# Patient Record
Sex: Female | Born: 1955 | Race: White | Hispanic: No | Marital: Married | Smoking: Never smoker
Health system: Southern US, Community
[De-identification: ages and names within clinical notes are randomized; demographics above are authoritative.]

## PROBLEM LIST (undated history)

## (undated) DIAGNOSIS — E785 Hyperlipidemia, unspecified: Secondary | ICD-10-CM

## (undated) DIAGNOSIS — K589 Irritable bowel syndrome without diarrhea: Secondary | ICD-10-CM

## (undated) DIAGNOSIS — G4730 Sleep apnea, unspecified: Secondary | ICD-10-CM

## (undated) DIAGNOSIS — Q2112 Patent foramen ovale: Secondary | ICD-10-CM

## (undated) DIAGNOSIS — D369 Benign neoplasm, unspecified site: Secondary | ICD-10-CM

## (undated) DIAGNOSIS — M8580 Other specified disorders of bone density and structure, unspecified site: Secondary | ICD-10-CM

## (undated) DIAGNOSIS — I10 Essential (primary) hypertension: Secondary | ICD-10-CM

## (undated) DIAGNOSIS — H269 Unspecified cataract: Secondary | ICD-10-CM

## (undated) DIAGNOSIS — N3010 Interstitial cystitis (chronic) without hematuria: Secondary | ICD-10-CM

## (undated) DIAGNOSIS — K219 Gastro-esophageal reflux disease without esophagitis: Secondary | ICD-10-CM

## (undated) DIAGNOSIS — M779 Enthesopathy, unspecified: Secondary | ICD-10-CM

## (undated) DIAGNOSIS — Q211 Atrial septal defect: Secondary | ICD-10-CM

## (undated) DIAGNOSIS — M1990 Unspecified osteoarthritis, unspecified site: Secondary | ICD-10-CM

## (undated) DIAGNOSIS — J189 Pneumonia, unspecified organism: Secondary | ICD-10-CM

## (undated) DIAGNOSIS — G4733 Obstructive sleep apnea (adult) (pediatric): Secondary | ICD-10-CM

## (undated) DIAGNOSIS — G43909 Migraine, unspecified, not intractable, without status migrainosus: Secondary | ICD-10-CM

## (undated) DIAGNOSIS — R5382 Chronic fatigue, unspecified: Secondary | ICD-10-CM

## (undated) DIAGNOSIS — R7303 Prediabetes: Secondary | ICD-10-CM

## (undated) DIAGNOSIS — H3530 Unspecified macular degeneration: Secondary | ICD-10-CM

## (undated) DIAGNOSIS — G4710 Hypersomnia, unspecified: Secondary | ICD-10-CM

## (undated) DIAGNOSIS — M6289 Other specified disorders of muscle: Secondary | ICD-10-CM

## (undated) DIAGNOSIS — F32A Depression, unspecified: Secondary | ICD-10-CM

## (undated) DIAGNOSIS — M797 Fibromyalgia: Secondary | ICD-10-CM

## (undated) DIAGNOSIS — R011 Cardiac murmur, unspecified: Secondary | ICD-10-CM

## (undated) DIAGNOSIS — F419 Anxiety disorder, unspecified: Secondary | ICD-10-CM

## (undated) DIAGNOSIS — R739 Hyperglycemia, unspecified: Secondary | ICD-10-CM

## (undated) HISTORY — DX: Hyperlipidemia, unspecified: E78.5

## (undated) HISTORY — DX: Chronic fatigue, unspecified: R53.82

## (undated) HISTORY — DX: Unspecified macular degeneration: H35.30

## (undated) HISTORY — PX: EYE SURGERY: SHX253

## (undated) HISTORY — DX: Patent foramen ovale: Q21.12

## (undated) HISTORY — DX: Enthesopathy, unspecified: M77.9

## (undated) HISTORY — DX: Benign neoplasm, unspecified site: D36.9

## (undated) HISTORY — DX: Essential (primary) hypertension: I10

## (undated) HISTORY — DX: Irritable bowel syndrome, unspecified: K58.9

## (undated) HISTORY — PX: ANKLE FRACTURE SURGERY: SHX122

## (undated) HISTORY — DX: Hypersomnia, unspecified: G47.10

## (undated) HISTORY — PX: SPINE SURGERY: SHX786

## (undated) HISTORY — DX: Anxiety disorder, unspecified: F41.9

## (undated) HISTORY — PX: BREAST LUMPECTOMY: SHX2

## (undated) HISTORY — PX: ANKLE ARTHROSCOPY: SUR85

## (undated) HISTORY — DX: Interstitial cystitis (chronic) without hematuria: N30.10

## (undated) HISTORY — DX: Sleep apnea, unspecified: G47.30

## (undated) HISTORY — DX: Fibromyalgia: M79.7

## (undated) HISTORY — DX: Other specified disorders of muscle: M62.89

## (undated) HISTORY — PX: FRACTURE SURGERY: SHX138

## (undated) HISTORY — DX: Atrial septal defect: Q21.1

## (undated) HISTORY — PX: BREAST EXCISIONAL BIOPSY: SUR124

## (undated) HISTORY — DX: Depression, unspecified: F32.A

## (undated) HISTORY — DX: Unspecified cataract: H26.9

## (undated) HISTORY — DX: Unspecified osteoarthritis, unspecified site: M19.90

## (undated) HISTORY — DX: Obstructive sleep apnea (adult) (pediatric): G47.33

## (undated) HISTORY — DX: Migraine, unspecified, not intractable, without status migrainosus: G43.909

## (undated) HISTORY — PX: NASAL SINUS SURGERY: SHX719

## (undated) HISTORY — PX: BLADDER SURGERY: SHX569

## (undated) HISTORY — DX: Other specified disorders of bone density and structure, unspecified site: M85.80

## (undated) HISTORY — PX: UPPER GASTROINTESTINAL ENDOSCOPY: SHX188

---
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
## 1988-04-01 HISTORY — PX: VAGINAL HYSTERECTOMY: SUR661

## 1998-08-29 ENCOUNTER — Other Ambulatory Visit: Admission: RE | Admit: 1998-08-29 | Discharge: 1998-08-29 | Payer: Self-pay | Admitting: Obstetrics and Gynecology

## 1999-10-12 ENCOUNTER — Other Ambulatory Visit: Admission: RE | Admit: 1999-10-12 | Discharge: 1999-10-12 | Payer: Self-pay | Admitting: Obstetrics and Gynecology

## 2001-12-14 ENCOUNTER — Encounter: Admission: RE | Admit: 2001-12-14 | Discharge: 2001-12-14 | Payer: Self-pay | Admitting: Obstetrics and Gynecology

## 2001-12-14 ENCOUNTER — Encounter: Payer: Self-pay | Admitting: Obstetrics and Gynecology

## 2002-11-16 ENCOUNTER — Encounter: Admission: RE | Admit: 2002-11-16 | Discharge: 2002-11-16 | Payer: Self-pay | Admitting: Obstetrics and Gynecology

## 2002-11-16 ENCOUNTER — Encounter: Payer: Self-pay | Admitting: Obstetrics and Gynecology

## 2003-01-03 ENCOUNTER — Other Ambulatory Visit: Admission: RE | Admit: 2003-01-03 | Discharge: 2003-01-03 | Payer: Self-pay | Admitting: Obstetrics and Gynecology

## 2003-03-04 ENCOUNTER — Encounter: Payer: Self-pay | Admitting: Family Medicine

## 2003-03-08 ENCOUNTER — Ambulatory Visit (HOSPITAL_COMMUNITY): Admission: RE | Admit: 2003-03-08 | Discharge: 2003-03-08 | Payer: Self-pay | Admitting: Urology

## 2003-03-08 ENCOUNTER — Encounter (INDEPENDENT_AMBULATORY_CARE_PROVIDER_SITE_OTHER): Payer: Self-pay

## 2003-08-12 ENCOUNTER — Ambulatory Visit (HOSPITAL_COMMUNITY): Admission: RE | Admit: 2003-08-12 | Discharge: 2003-08-12 | Payer: Self-pay | Admitting: Urology

## 2003-08-12 ENCOUNTER — Encounter (INDEPENDENT_AMBULATORY_CARE_PROVIDER_SITE_OTHER): Payer: Self-pay | Admitting: Specialist

## 2003-08-12 ENCOUNTER — Ambulatory Visit (HOSPITAL_BASED_OUTPATIENT_CLINIC_OR_DEPARTMENT_OTHER): Admission: RE | Admit: 2003-08-12 | Discharge: 2003-08-12 | Payer: Self-pay | Admitting: Urology

## 2004-01-16 ENCOUNTER — Other Ambulatory Visit: Admission: RE | Admit: 2004-01-16 | Discharge: 2004-01-16 | Payer: Self-pay | Admitting: Obstetrics and Gynecology

## 2004-01-19 ENCOUNTER — Encounter: Admission: RE | Admit: 2004-01-19 | Discharge: 2004-01-19 | Payer: Self-pay | Admitting: Obstetrics and Gynecology

## 2004-04-01 HISTORY — PX: CERVICAL DISCECTOMY: SHX98

## 2004-04-04 ENCOUNTER — Ambulatory Visit (HOSPITAL_COMMUNITY): Admission: RE | Admit: 2004-04-04 | Discharge: 2004-04-04 | Payer: Self-pay | Admitting: Neurosurgery

## 2004-05-01 ENCOUNTER — Ambulatory Visit (HOSPITAL_COMMUNITY): Admission: RE | Admit: 2004-05-01 | Discharge: 2004-05-01 | Payer: Self-pay | Admitting: Neurosurgery

## 2004-05-23 ENCOUNTER — Encounter: Admission: RE | Admit: 2004-05-23 | Discharge: 2004-05-23 | Payer: Self-pay | Admitting: Neurosurgery

## 2004-07-24 ENCOUNTER — Ambulatory Visit (HOSPITAL_BASED_OUTPATIENT_CLINIC_OR_DEPARTMENT_OTHER): Admission: RE | Admit: 2004-07-24 | Discharge: 2004-07-24 | Payer: Self-pay | Admitting: Urology

## 2004-08-10 ENCOUNTER — Ambulatory Visit (HOSPITAL_COMMUNITY): Admission: RE | Admit: 2004-08-10 | Discharge: 2004-08-10 | Payer: Self-pay | Admitting: Neurosurgery

## 2005-01-29 ENCOUNTER — Encounter: Admission: RE | Admit: 2005-01-29 | Discharge: 2005-01-29 | Payer: Self-pay | Admitting: Obstetrics and Gynecology

## 2005-01-30 ENCOUNTER — Other Ambulatory Visit: Admission: RE | Admit: 2005-01-30 | Discharge: 2005-01-30 | Payer: Self-pay | Admitting: Obstetrics and Gynecology

## 2005-02-19 ENCOUNTER — Encounter: Admission: RE | Admit: 2005-02-19 | Discharge: 2005-02-19 | Payer: Self-pay | Admitting: Obstetrics and Gynecology

## 2005-02-25 ENCOUNTER — Ambulatory Visit: Payer: Self-pay | Admitting: Internal Medicine

## 2005-02-26 ENCOUNTER — Ambulatory Visit: Payer: Self-pay | Admitting: Internal Medicine

## 2005-03-08 ENCOUNTER — Ambulatory Visit: Payer: Self-pay | Admitting: Internal Medicine

## 2005-04-18 ENCOUNTER — Ambulatory Visit: Payer: Self-pay | Admitting: Internal Medicine

## 2005-07-30 ENCOUNTER — Ambulatory Visit: Payer: Self-pay | Admitting: Internal Medicine

## 2005-09-10 ENCOUNTER — Ambulatory Visit: Payer: Self-pay | Admitting: Internal Medicine

## 2005-09-20 ENCOUNTER — Encounter (INDEPENDENT_AMBULATORY_CARE_PROVIDER_SITE_OTHER): Payer: Self-pay | Admitting: *Deleted

## 2005-09-20 ENCOUNTER — Ambulatory Visit: Payer: Self-pay | Admitting: Internal Medicine

## 2006-02-03 ENCOUNTER — Encounter: Admission: RE | Admit: 2006-02-03 | Discharge: 2006-02-03 | Payer: Self-pay | Admitting: Obstetrics and Gynecology

## 2006-02-26 ENCOUNTER — Ambulatory Visit (HOSPITAL_COMMUNITY): Admission: RE | Admit: 2006-02-26 | Discharge: 2006-02-26 | Payer: Self-pay | Admitting: Urology

## 2006-02-26 ENCOUNTER — Encounter (INDEPENDENT_AMBULATORY_CARE_PROVIDER_SITE_OTHER): Payer: Self-pay | Admitting: *Deleted

## 2006-03-03 ENCOUNTER — Encounter: Admission: RE | Admit: 2006-03-03 | Discharge: 2006-03-03 | Payer: Self-pay

## 2006-05-09 ENCOUNTER — Ambulatory Visit (HOSPITAL_BASED_OUTPATIENT_CLINIC_OR_DEPARTMENT_OTHER): Admission: RE | Admit: 2006-05-09 | Discharge: 2006-05-09 | Payer: Self-pay | Admitting: Urology

## 2006-06-16 ENCOUNTER — Other Ambulatory Visit: Payer: Self-pay

## 2006-06-16 ENCOUNTER — Inpatient Hospital Stay: Payer: Self-pay | Admitting: Specialist

## 2007-01-21 ENCOUNTER — Encounter: Payer: Self-pay | Admitting: Family Medicine

## 2007-01-22 ENCOUNTER — Ambulatory Visit: Payer: Self-pay | Admitting: Family Medicine

## 2007-01-22 DIAGNOSIS — Q2111 Secundum atrial septal defect: Secondary | ICD-10-CM | POA: Insufficient documentation

## 2007-01-22 DIAGNOSIS — M159 Polyosteoarthritis, unspecified: Secondary | ICD-10-CM

## 2007-01-22 DIAGNOSIS — N3010 Interstitial cystitis (chronic) without hematuria: Secondary | ICD-10-CM

## 2007-01-22 DIAGNOSIS — E559 Vitamin D deficiency, unspecified: Secondary | ICD-10-CM

## 2007-01-22 DIAGNOSIS — G43909 Migraine, unspecified, not intractable, without status migrainosus: Secondary | ICD-10-CM

## 2007-01-22 DIAGNOSIS — Q211 Atrial septal defect: Secondary | ICD-10-CM | POA: Insufficient documentation

## 2007-01-22 DIAGNOSIS — M797 Fibromyalgia: Secondary | ICD-10-CM

## 2007-01-22 DIAGNOSIS — N951 Menopausal and female climacteric states: Secondary | ICD-10-CM | POA: Insufficient documentation

## 2007-01-29 ENCOUNTER — Telehealth (INDEPENDENT_AMBULATORY_CARE_PROVIDER_SITE_OTHER): Payer: Self-pay | Admitting: *Deleted

## 2007-02-04 ENCOUNTER — Encounter: Payer: Self-pay | Admitting: Family Medicine

## 2007-02-11 ENCOUNTER — Ambulatory Visit (HOSPITAL_BASED_OUTPATIENT_CLINIC_OR_DEPARTMENT_OTHER): Admission: RE | Admit: 2007-02-11 | Discharge: 2007-02-11 | Payer: Self-pay | Admitting: Rheumatology

## 2007-02-15 ENCOUNTER — Ambulatory Visit: Payer: Self-pay | Admitting: Internal Medicine

## 2007-02-18 ENCOUNTER — Encounter: Admission: RE | Admit: 2007-02-18 | Discharge: 2007-02-18 | Payer: Self-pay | Admitting: Family Medicine

## 2007-02-27 ENCOUNTER — Encounter (INDEPENDENT_AMBULATORY_CARE_PROVIDER_SITE_OTHER): Payer: Self-pay | Admitting: *Deleted

## 2007-03-31 ENCOUNTER — Encounter: Payer: Self-pay | Admitting: Family Medicine

## 2007-04-01 ENCOUNTER — Encounter: Payer: Self-pay | Admitting: Family Medicine

## 2007-04-13 ENCOUNTER — Encounter: Payer: Self-pay | Admitting: Family Medicine

## 2007-04-23 ENCOUNTER — Ambulatory Visit: Payer: Self-pay | Admitting: Family Medicine

## 2007-04-23 DIAGNOSIS — R5383 Other fatigue: Secondary | ICD-10-CM

## 2007-04-24 LAB — CONVERTED CEMR LAB
AST: 37 units/L (ref 0–37)
Albumin: 3.8 g/dL (ref 3.5–5.2)
BUN: 4 mg/dL — ABNORMAL LOW (ref 6–23)
Basophils Absolute: 0.1 10*3/uL (ref 0.0–0.1)
Chloride: 98 meq/L (ref 96–112)
Cholesterol: 225 mg/dL (ref 0–200)
Direct LDL: 149.9 mg/dL
Eosinophils Absolute: 0.2 10*3/uL (ref 0.0–0.6)
GFR calc non Af Amer: 81 mL/min
HCT: 40.2 % (ref 36.0–46.0)
HDL: 51.5 mg/dL (ref >39.0)
MCHC: 33.5 g/dL (ref 30.0–36.0)
MCV: 91.8 fL (ref 78.0–100.0)
Monocytes Relative: 9.6 % (ref 3.0–11.0)
Neutrophils Relative %: 47.8 % (ref 43.0–77.0)
Potassium: 4.2 meq/L (ref 3.5–5.1)
RBC: 4.38 M/uL (ref 3.87–5.11)
Sodium: 137 meq/L (ref 135–145)
TSH: 4.07 microintl units/mL (ref 0.35–5.50)
Triglycerides: 218 mg/dL (ref 0–149)

## 2007-05-26 ENCOUNTER — Encounter: Payer: Self-pay | Admitting: Family Medicine

## 2007-05-26 ENCOUNTER — Ambulatory Visit: Payer: Self-pay | Admitting: Internal Medicine

## 2007-05-31 DIAGNOSIS — E785 Hyperlipidemia, unspecified: Secondary | ICD-10-CM | POA: Insufficient documentation

## 2007-05-31 DIAGNOSIS — I10 Essential (primary) hypertension: Secondary | ICD-10-CM

## 2007-06-03 ENCOUNTER — Encounter: Payer: Self-pay | Admitting: Family Medicine

## 2007-06-07 ENCOUNTER — Encounter: Payer: Self-pay | Admitting: Internal Medicine

## 2007-06-07 ENCOUNTER — Ambulatory Visit (HOSPITAL_BASED_OUTPATIENT_CLINIC_OR_DEPARTMENT_OTHER): Admission: RE | Admit: 2007-06-07 | Discharge: 2007-06-07 | Payer: Self-pay | Admitting: Internal Medicine

## 2007-06-08 ENCOUNTER — Encounter: Payer: Self-pay | Admitting: Internal Medicine

## 2007-06-20 ENCOUNTER — Ambulatory Visit: Payer: Self-pay | Admitting: Internal Medicine

## 2007-06-25 ENCOUNTER — Ambulatory Visit: Payer: Self-pay | Admitting: Internal Medicine

## 2007-06-25 DIAGNOSIS — G4733 Obstructive sleep apnea (adult) (pediatric): Secondary | ICD-10-CM | POA: Insufficient documentation

## 2007-07-08 ENCOUNTER — Encounter: Payer: Self-pay | Admitting: Internal Medicine

## 2007-07-27 ENCOUNTER — Ambulatory Visit: Payer: Self-pay | Admitting: Internal Medicine

## 2007-09-03 ENCOUNTER — Encounter: Payer: Self-pay | Admitting: Family Medicine

## 2007-10-29 ENCOUNTER — Telehealth: Payer: Self-pay | Admitting: Internal Medicine

## 2007-10-29 ENCOUNTER — Ambulatory Visit: Payer: Self-pay | Admitting: Internal Medicine

## 2007-10-30 LAB — CONVERTED CEMR LAB
ALT: 18 units/L (ref 0–35)
AST: 20 units/L (ref 0–37)
Albumin: 4.3 g/dL (ref 3.5–5.2)
Basophils Relative: 0.9 % (ref 0.0–3.0)
Hemoglobin: 15.3 g/dL — ABNORMAL HIGH (ref 12.0–15.0)
Lymphocytes Relative: 41.9 % (ref 12.0–46.0)
Monocytes Relative: 5.9 % (ref 3.0–12.0)
Neutro Abs: 3.2 10*3/uL (ref 1.4–7.7)
RBC: 5.11 M/uL (ref 3.87–5.11)

## 2007-11-02 ENCOUNTER — Ambulatory Visit (HOSPITAL_COMMUNITY): Admission: RE | Admit: 2007-11-02 | Discharge: 2007-11-02 | Payer: Self-pay | Admitting: Internal Medicine

## 2007-11-06 ENCOUNTER — Encounter: Payer: Self-pay | Admitting: Family Medicine

## 2007-11-23 ENCOUNTER — Telehealth: Payer: Self-pay | Admitting: Internal Medicine

## 2007-11-23 ENCOUNTER — Ambulatory Visit: Payer: Self-pay | Admitting: Internal Medicine

## 2007-11-24 ENCOUNTER — Encounter: Payer: Self-pay | Admitting: Family Medicine

## 2007-12-30 ENCOUNTER — Telehealth: Payer: Self-pay | Admitting: Internal Medicine

## 2008-01-04 ENCOUNTER — Ambulatory Visit: Payer: Self-pay | Admitting: Internal Medicine

## 2008-01-04 DIAGNOSIS — G2402 Drug induced acute dystonia: Secondary | ICD-10-CM | POA: Insufficient documentation

## 2008-01-21 ENCOUNTER — Encounter: Payer: Self-pay | Admitting: Family Medicine

## 2008-02-22 ENCOUNTER — Telehealth: Payer: Self-pay | Admitting: Internal Medicine

## 2008-03-01 ENCOUNTER — Encounter: Admission: RE | Admit: 2008-03-01 | Discharge: 2008-03-01 | Payer: Self-pay | Admitting: Family Medicine

## 2008-03-01 ENCOUNTER — Ambulatory Visit (HOSPITAL_COMMUNITY): Admission: RE | Admit: 2008-03-01 | Discharge: 2008-03-01 | Payer: Self-pay | Admitting: Internal Medicine

## 2008-04-07 ENCOUNTER — Encounter: Payer: Self-pay | Admitting: Family Medicine

## 2008-05-05 ENCOUNTER — Ambulatory Visit: Payer: Self-pay | Admitting: Family Medicine

## 2008-05-05 DIAGNOSIS — K219 Gastro-esophageal reflux disease without esophagitis: Secondary | ICD-10-CM

## 2008-05-06 ENCOUNTER — Encounter: Payer: Self-pay | Admitting: Family Medicine

## 2008-05-11 ENCOUNTER — Encounter (INDEPENDENT_AMBULATORY_CARE_PROVIDER_SITE_OTHER): Payer: Self-pay | Admitting: *Deleted

## 2008-05-11 LAB — CONVERTED CEMR LAB
ALT: 13 units/L (ref 0–35)
Albumin: 4.1 g/dL (ref 3.5–5.2)
Alkaline Phosphatase: 63 units/L (ref 39–117)
CO2: 19 meq/L (ref 19–32)
Eosinophils Absolute: 0.1 10*3/uL (ref 0.0–0.7)
Glucose, Bld: 107 mg/dL — ABNORMAL HIGH (ref 70–99)
LDL Cholesterol: 153 mg/dL — ABNORMAL HIGH (ref 0–99)
Lymphocytes Relative: 46 % (ref 12–46)
Lymphs Abs: 2.3 10*3/uL (ref 0.7–4.0)
Neutrophils Relative %: 46 % (ref 43–77)
Platelets: 275 10*3/uL (ref 150–400)
Potassium: 4.4 meq/L (ref 3.5–5.3)
Sodium: 141 meq/L (ref 135–145)
TSH: 2.665 microintl units/mL (ref 0.350–4.50)
Total Protein: 7 g/dL (ref 6.0–8.3)
Triglycerides: 205 mg/dL — ABNORMAL HIGH (ref <150)
Vit D, 1,25-Dihydroxy: 27 — ABNORMAL LOW (ref 30–89)
WBC: 5.1 10*3/uL (ref 4.0–10.5)

## 2008-05-19 ENCOUNTER — Encounter: Payer: Self-pay | Admitting: Family Medicine

## 2008-06-24 ENCOUNTER — Telehealth: Payer: Self-pay | Admitting: Family Medicine

## 2008-07-06 ENCOUNTER — Encounter: Payer: Self-pay | Admitting: Family Medicine

## 2008-09-09 ENCOUNTER — Ambulatory Visit: Payer: Self-pay | Admitting: Internal Medicine

## 2008-09-09 ENCOUNTER — Ambulatory Visit (HOSPITAL_COMMUNITY): Admission: RE | Admit: 2008-09-09 | Discharge: 2008-09-09 | Payer: Self-pay | Admitting: Internal Medicine

## 2008-09-09 LAB — CONVERTED CEMR LAB: IgA: 297 mg/dL (ref 68–378)

## 2008-09-14 LAB — CONVERTED CEMR LAB: Tissue Transglutaminase Ab, IgA: 0.5 units (ref <7)

## 2008-09-27 ENCOUNTER — Encounter: Payer: Self-pay | Admitting: Family Medicine

## 2008-10-05 ENCOUNTER — Ambulatory Visit: Payer: Self-pay | Admitting: Family Medicine

## 2008-10-05 DIAGNOSIS — F329 Major depressive disorder, single episode, unspecified: Secondary | ICD-10-CM

## 2008-10-05 DIAGNOSIS — R4589 Other symptoms and signs involving emotional state: Secondary | ICD-10-CM | POA: Insufficient documentation

## 2008-10-13 ENCOUNTER — Telehealth (INDEPENDENT_AMBULATORY_CARE_PROVIDER_SITE_OTHER): Payer: Self-pay

## 2008-10-20 ENCOUNTER — Encounter: Payer: Self-pay | Admitting: Internal Medicine

## 2008-10-24 ENCOUNTER — Telehealth: Payer: Self-pay | Admitting: Family Medicine

## 2008-10-25 ENCOUNTER — Encounter: Payer: Self-pay | Admitting: Family Medicine

## 2008-10-28 ENCOUNTER — Telehealth: Payer: Self-pay | Admitting: Family Medicine

## 2008-11-02 ENCOUNTER — Encounter: Payer: Self-pay | Admitting: Family Medicine

## 2008-11-18 ENCOUNTER — Ambulatory Visit: Payer: Self-pay | Admitting: Internal Medicine

## 2008-12-28 ENCOUNTER — Encounter: Payer: Self-pay | Admitting: Family Medicine

## 2009-02-14 ENCOUNTER — Telehealth (INDEPENDENT_AMBULATORY_CARE_PROVIDER_SITE_OTHER): Payer: Self-pay | Admitting: *Deleted

## 2009-03-01 ENCOUNTER — Encounter: Payer: Self-pay | Admitting: Family Medicine

## 2009-03-02 ENCOUNTER — Encounter: Admission: RE | Admit: 2009-03-02 | Discharge: 2009-03-02 | Payer: Self-pay | Admitting: Family Medicine

## 2009-03-09 ENCOUNTER — Encounter: Payer: Self-pay | Admitting: Family Medicine

## 2009-03-14 ENCOUNTER — Encounter: Payer: Self-pay | Admitting: Family Medicine

## 2009-03-14 ENCOUNTER — Encounter: Admission: RE | Admit: 2009-03-14 | Discharge: 2009-03-14 | Payer: Self-pay | Admitting: Family Medicine

## 2009-03-22 ENCOUNTER — Encounter: Payer: Self-pay | Admitting: Family Medicine

## 2009-05-17 ENCOUNTER — Ambulatory Visit: Payer: Self-pay | Admitting: Family Medicine

## 2009-05-17 DIAGNOSIS — M8580 Other specified disorders of bone density and structure, unspecified site: Secondary | ICD-10-CM

## 2009-05-22 ENCOUNTER — Encounter: Admission: RE | Admit: 2009-05-22 | Discharge: 2009-05-22 | Payer: Self-pay | Admitting: Family Medicine

## 2009-05-22 LAB — CONVERTED CEMR LAB
ALT: 17 units/L (ref 0–35)
AST: 19 units/L (ref 0–37)
Basophils Relative: 0.7 % (ref 0.0–3.0)
CO2: 31 meq/L (ref 19–32)
Calcium: 9.5 mg/dL (ref 8.4–10.5)
Chloride: 104 meq/L (ref 96–112)
Direct LDL: 193.9 mg/dL
Eosinophils Relative: 1.4 % (ref 0.0–5.0)
GFR calc non Af Amer: 79.88 mL/min (ref >60)
HCT: 43.6 % (ref 36.0–46.0)
Hemoglobin: 14.1 g/dL (ref 12.0–15.0)
Lymphs Abs: 2.5 10*3/uL (ref 0.7–4.0)
MCV: 94 fL (ref 78.0–100.0)
Monocytes Absolute: 0.3 10*3/uL (ref 0.1–1.0)
Monocytes Relative: 5 % (ref 3.0–12.0)
Potassium: 4.5 meq/L (ref 3.5–5.1)
RBC: 4.63 M/uL (ref 3.87–5.11)
Sodium: 141 meq/L (ref 135–145)
Total Protein: 7.1 g/dL (ref 6.0–8.3)
Triglycerides: 158 mg/dL — ABNORMAL HIGH (ref 0.0–149.0)
WBC: 5.6 10*3/uL (ref 4.5–10.5)

## 2009-05-23 ENCOUNTER — Encounter: Payer: Self-pay | Admitting: Family Medicine

## 2009-05-30 ENCOUNTER — Telehealth: Payer: Self-pay | Admitting: Family Medicine

## 2009-06-14 ENCOUNTER — Encounter: Payer: Self-pay | Admitting: Family Medicine

## 2009-07-03 ENCOUNTER — Telehealth: Payer: Self-pay | Admitting: Family Medicine

## 2009-07-18 ENCOUNTER — Encounter: Payer: Self-pay | Admitting: Family Medicine

## 2009-08-24 ENCOUNTER — Ambulatory Visit: Payer: Self-pay | Admitting: Family Medicine

## 2009-08-29 LAB — CONVERTED CEMR LAB
AST: 16 units/L (ref 0–37)
Cholesterol: 255 mg/dL — ABNORMAL HIGH (ref 0–200)
Direct LDL: 167.8 mg/dL
Total CHOL/HDL Ratio: 4
VLDL: 58 mg/dL — ABNORMAL HIGH (ref 0.0–40.0)

## 2009-09-13 ENCOUNTER — Encounter: Payer: Self-pay | Admitting: Family Medicine

## 2009-11-14 ENCOUNTER — Encounter: Payer: Self-pay | Admitting: Family Medicine

## 2009-11-30 ENCOUNTER — Ambulatory Visit: Payer: Self-pay | Admitting: Family Medicine

## 2009-12-01 LAB — CONVERTED CEMR LAB
ALT: 9 units/L (ref 0–35)
Cholesterol: 255 mg/dL — ABNORMAL HIGH (ref 0–200)
Direct LDL: 174.5 mg/dL
HDL: 61 mg/dL (ref >39.00)
VLDL: 44.6 mg/dL — ABNORMAL HIGH (ref 0.0–40.0)

## 2009-12-06 ENCOUNTER — Ambulatory Visit: Payer: Self-pay | Admitting: Family Medicine

## 2009-12-28 ENCOUNTER — Telehealth: Payer: Self-pay | Admitting: Family Medicine

## 2010-01-15 ENCOUNTER — Telehealth: Payer: Self-pay | Admitting: Family Medicine

## 2010-01-17 ENCOUNTER — Ambulatory Visit: Payer: Self-pay | Admitting: Family Medicine

## 2010-01-19 LAB — CONVERTED CEMR LAB
ALT: 13 units/L (ref 0–35)
HDL: 54.2 mg/dL (ref >39.00)
Triglycerides: 159 mg/dL — ABNORMAL HIGH (ref 0.0–149.0)
VLDL: 31.8 mg/dL (ref 0.0–40.0)

## 2010-02-13 ENCOUNTER — Encounter: Payer: Self-pay | Admitting: Family Medicine

## 2010-03-07 ENCOUNTER — Telehealth: Payer: Self-pay | Admitting: Family Medicine

## 2010-03-13 ENCOUNTER — Encounter
Admission: RE | Admit: 2010-03-13 | Discharge: 2010-03-13 | Payer: Self-pay | Source: Home / Self Care | Attending: Family Medicine | Admitting: Family Medicine

## 2010-03-15 ENCOUNTER — Encounter: Payer: Self-pay | Admitting: Family Medicine

## 2010-04-05 ENCOUNTER — Telehealth: Payer: Self-pay | Admitting: Internal Medicine

## 2010-04-16 ENCOUNTER — Ambulatory Visit
Admission: RE | Admit: 2010-04-16 | Discharge: 2010-04-16 | Payer: Self-pay | Source: Home / Self Care | Attending: Family Medicine | Admitting: Family Medicine

## 2010-04-16 ENCOUNTER — Telehealth: Payer: Self-pay | Admitting: Family Medicine

## 2010-04-20 ENCOUNTER — Telehealth: Payer: Self-pay | Admitting: Family Medicine

## 2010-04-20 ENCOUNTER — Encounter (INDEPENDENT_AMBULATORY_CARE_PROVIDER_SITE_OTHER): Payer: Self-pay

## 2010-04-22 ENCOUNTER — Encounter: Payer: Self-pay | Admitting: Urology

## 2010-04-24 ENCOUNTER — Ambulatory Visit
Admission: RE | Admit: 2010-04-24 | Discharge: 2010-04-24 | Payer: Self-pay | Source: Home / Self Care | Attending: Internal Medicine | Admitting: Internal Medicine

## 2010-05-01 ENCOUNTER — Ambulatory Visit
Admission: RE | Admit: 2010-05-01 | Discharge: 2010-05-01 | Payer: Self-pay | Source: Home / Self Care | Attending: Internal Medicine | Admitting: Internal Medicine

## 2010-05-01 ENCOUNTER — Encounter: Payer: Self-pay | Admitting: Internal Medicine

## 2010-05-01 NOTE — Letter (Signed)
Summary: [facility]  [facility]   Imported By: Lanelle Bal 10/04/2009 11:52:55  _____________________________________________________________________  External Attachment:    Type:   Image     Comment:   External Document

## 2010-05-01 NOTE — Assessment & Plan Note (Signed)
Summary: FOLLOW UP AFTER LABS/RI   Vital Signs:  Patient profile:   55 year old female Height:      63 inches Weight:      167.75 pounds BMI:     29.82 Temp:     97.7 degrees F oral Pulse rate:   76 / minute Pulse rhythm:   regular BP sitting:   136 / 94  (left arm) Cuff size:   regular  Vitals Entered By: Lewanda Rife LPN (December 06, 2009 2:14 PM)  Serial Vital Signs/Assessments:  Time      Position  BP       Pulse  Resp  Temp     By                     120/82                         Judith Part MD  CC: f/u after labs   History of Present Illness: here for f/u of hyperlipidemia   is doing fair overall  migrianes are worse lately- that has been a big problem  [facility]  tried atenolol for headaches - did help migraines some  taken long time to get adj to it  was considering trying botox but ins may not pay   sleep problem has not changed at all  was able to cut her fenanyl a bit 6 mo ago  then went back up on her wellbutrin   inc hydroxyzine for allergies  still sleeps about 14 hours per day  does not think it is depression   lipids are actually worse with trig of 223 and HDL of 61 and LDL 174.5 (up from 167) father had his first MI in his 9s , with high chol  does not eat much at all  usually eats once per day and does not cook  red meat --- at least 1-2 times per week  no fried foods at all  no eggs  no biscuits , no bacon or sausage  just started eating cheese - 2%  no shellfish  does eat fat free ice cream      diet  bp 136/94 first check today  Allergies: 1)  ! Buspar 2)  ! * Trazadone  Past History:  Past Medical History: Last updated: 05/17/2009 Interstitial cystitis  pelvic floor dysfunction arthritis vit D def                                                                                     urology- Evans ankle fx times 2                                                                          GI- Gessner chronic fatigue  rheum- Devishwar                                                                                                  headache - Dr Sharene Skeans  hand tendonitis migranes PFO mild fibromyalgia Hyperlipidemia Hypertension "complicated migraine"- Dohmeier Hypersomnia IBS- constipation predominant Hemorrhoids osteopenia   Past Surgical History: Last updated: 03/20/2009 many breast sx hyst 1990- because of the IC partial sinus sx multiple multiple bladder sx and hydrodistension cervical diskectomy with fusion and plating 2006 ankle fx bilat with signs 2009 EGD 2007- gastritis, duodentitis colonoscopy 2007- hemorrhoids breast bx 12/10- fibroadenoma   Family History: Last updated: 05/05/2008 father- MI in early 81s, died at 41, DM, ETOH early in life mother- fibromyalgia grandparents P - DM GM with stomach cancer uncle colon cancer brother died in childhood from electrical accident  Social History: Last updated: 10/29/2007 is disabled - (from IC) some stretching exercise class married no children Patient has never smoked.  Alcohol Use - no Illicit Drug Use - no  Risk Factors: Smoking Status: never (10/29/2007)  Review of Systems General:  Complains of fatigue, malaise, and sleep disorder; denies chills and fever. Eyes:  Denies blurring and eye irritation. CV:  Denies chest pain or discomfort, palpitations, shortness of breath with exertion, and swelling of feet. Resp:  Denies cough, shortness of breath, and wheezing. GI:  Denies abdominal pain, change in bowel habits, indigestion, and nausea. GU:  Denies dysuria and urinary frequency. MS:  Complains of joint pain, muscle aches, and stiffness; denies cramps and muscle weakness. Derm:  Denies itching, lesion(s), poor wound healing, and rash. Neuro:  Denies numbness and tingling. Psych:  pt denies depression (but seems  depressed). Endo:  Denies cold intolerance and heat intolerance. Heme:  Denies abnormal bruising and bleeding.   Impression & Recommendations:  Problem # 1:  HYPERLIPIDEMIA (ICD-272.4) Assessment Deteriorated  with fairly low fat diet and fam hx of high chol and early MI  disc risks of high chol rev low sat fat diet  will try zocor low dose and update if side eff lab in 6 weeks  of note - pt upset about this issue and was crying  Her updated medication list for this problem includes:    Zocor 20 Mg Tabs (Simvastatin) .Marland Kitchen... 1 by mouth once daily in evening for high cholesterol  Orders: Prescription Created Electronically [id number])  Problem # 2:  HYPERTENSION, ESSENTIAL NOS (ICD-401.9) Assessment: Improved  better with atenolol- which also helps her headaches  refilled this  Her updated medication list for this problem includes:    Atenolol 100 Mg Tabs (Atenolol) .Marland Kitchen... Take 1 tablet by mouth once a day  BP today: 136/94-- 120/82 on re check at rest  Prior BP: 134/88 (05/17/2009)  Labs Reviewed: K+: 4.5 (05/17/2009) Creat: : 0.8 (05/17/2009)   Chol: 255 (11/30/2009)   HDL: 61.00 (11/30/2009)   LDL: 153 (05/06/2008)   TG: 223.0 (11/30/2009)  Orders: Prescription Created Electronically [phone])  Complete Medication List: 1)  Hydroxyzine Hcl 50 Mg Tabs (Hydroxyzine hcl) .... Take 2 by mouth at bedtime 2)  Diazepam 5 Mg  Tabs (Diazepam) .... 1/2 in the am and 1 in the pm 3)  Elmiron 100 Mg Caps (Pentosan polysulfate sodium) .... Two by mouth two times a day 4)  Hydrocodone-acetaminophen 10-650 Mg Tabs (Hydrocodone-acetaminophen) .... Take by mouth as directed prn 5)  Nitrofurantoin Macrocrystal 100 Mg Caps (Nitrofurantoin macrocrystal) .... One by mouth once daily more as needed. 6)  Wellbutrin Xl 150 Mg Tb24 (Bupropion hcl) .... One by mouth twice  daily 7)  Lidoderm 5 % Ptch (Lidocaine) .... Use as directed 8)  Finacea 15 % Gel (Azelaic acid) .... Apply to face twice a day 9)   Miralax Powd (Polyethylene glycol 3350) .Marland Kitchen.. 1-2 times/day 10)  Pepcid 40 Mg Tabs (Famotidine) .Marland Kitchen.. 1 two times a day 11)  Vitamin D3 10000 Unit Caps (Cholecalciferol) .... Take one daily 12)  Amitiza 8 Mcg Caps (Lubiprostone) .... Take two daily 13)  Estradiol 2 Mg Tabs (Estradiol) .Marland Kitchen.. 1 1/2 by mouth once daily 14)  Fentanyl 50 Mcg/hr Pt72 (Fentanyl) .... One patch every other day. 15)  Vitamin D (ergocalciferol) 50000 Unit Caps (Ergocalciferol) .... One capsule by mouth monthly. 16)  Baclofen 10 Mg Tabs (Baclofen) .... As directed as needed. 17)  Atenolol 100 Mg Tabs (Atenolol) .... Take 1 tablet by mouth once a day 18)  Zocor 20 Mg Tabs (Simvastatin) .Marland Kitchen.. 1 by mouth once daily in evening for high cholesterol  Patient Instructions: 1)  start zocor for high cholesterol 20 mg once daily in the evening  2)  eat low cholesterol diet -- lay off the red meat  3)  you can raise your HDL (good cholesterol) by increasing exercise and eating omega 3 fatty acid supplement like fish oil or flax seed oil over the counter 4)  you can lower LDL (bad cholesterol) by limiting saturated fats in diet like red meat, fried foods, egg yolks, fatty breakfast meats, high fat dairy products and shellfish  5)  schedule fasting labs in 6 weeks lipid/ast/alt 272  6)  if any side effects stop the medicine and let me know  Prescriptions: ATENOLOL 100 MG TABS (ATENOLOL) Take 1 tablet by mouth once a day  #30 x 11   Entered and Authorized by:   Judith Part MD   Signed by:   Judith Part MD on 12/06/2009   Method used:   Electronically to        [facility] [phone]* (retail)       [address] [place], [place]  [address]       Ph: [phone] or [phone]       Fax: [phone]   RxID:   [id number] ZOCOR 20 MG TABS (SIMVASTATIN) 1 by mouth once daily in evening for high cholesterol  #30 x 11   Entered and Authorized by:   Judith Part MD   Signed by:   Judith Part  MD on 12/06/2009   Method used:   Electronically to        [facility] [phone]* (retail)       [address] [place], [place]  [address]       Ph: [phone] or [phone]       Fax: [phone]   RxID:   [id number]   Current Allergies (reviewed today): ! BUSPAR ! * TRAZADONE

## 2010-05-01 NOTE — Progress Notes (Signed)
Summary: regarding hormone change  Phone Note Call from Patient Call back at Home Phone [phone]   Caller: Patient Call For: Judith Part MD Summary of Call: Pt states that since changing from premarin to estradiol she has had some breast tenderness and discharge.  I advised pt that this is probably her body adjusting to the change and that it could continue for awhile.  Advised pt to let us know if it continues longer than a couple of months or if it gets worse.  Also, she said that you had mentioned at one time that you wanted her to have some type of cardiac test to check for murmur.  I couldnt find any mention of that in last 2 office notes.  She was just wondering if you still wanted her to have the test  done. Initial call taken by: Lowella Petties CMA,  July 03, 2009 1:16 PM  Follow-up for Phone Call        if she wants to go back to premarin that is fine with me will disc heart M issue at f/u in May Follow-up by: Judith Part MD,  July 03, 2009 3:51 PM  Additional Follow-up for Phone Call Additional follow up Details #1::        Pt said does not want to go back on Premarin. Pt just letting you know what symptoms she was having with tenderness and some discharge of breast. Should she watch and see how does or what do you suggest. Pt said she did not mention anything about heart murmur???Please advise. Lewanda Rife LPN  July 04, 979 5:08 PM   Laurie's triage note mentioned something about a heart M-- must have been a mistake because I also could not find anything in record... please ask her to cut her estradiol in 1/2 -- take 1/2 daily and let me know if this is better - and change direction on emr  Additional Follow-up by: Judith Part MD,  July 03, 2009 5:13 PM    Additional Follow-up for Phone Call Additional follow up Details #2::    Patient notified as instructed by telephone. Pt said she was going to continue 1 and 1/2 pills for a few more day s to see if her  body is adjusting. She will call back if she does not get better for further instruction on whether to take one pill daily or 1/2 pill daily. Lewanda Rife LPN  July 03, 1912 5:21 PM

## 2010-05-01 NOTE — Progress Notes (Signed)
  Phone Note Call from Patient   Caller: Patient Summary of Call: Called the patient concerning the Psychology referral , she did not want to go to [facility] at all. she has a call into her insurance Co and will wait to hear back from them if they find her someone in [place] or [place] area. She will call us back if she wants our help in making this referral. Initial call taken by: Carlton Adam,  May 30, 2009 2:34 PM

## 2010-05-01 NOTE — Letter (Signed)
Summary: [facility]   Imported By: Lanelle Bal 08/02/2009 11:57:24  _____________________________________________________________________  External Attachment:    Type:   Image     Comment:   External Document

## 2010-05-01 NOTE — Progress Notes (Signed)
Summary: zocor   Phone Note Call from Patient Call back at Home Phone [phone]   Caller: Patient Call For: Judith Part MD Summary of Call: Patient says that she has been feeling very tired and weak since starting the zocor. She says that she doesn't want to take in anymore. She says that she is willing to try something else. [facility] [address]  Initial call taken by: Melody Comas,  December 28, 2009 2:21 PM  Follow-up for Phone Call        first stop for 2 weeks and update me with how she is feeling -- thanks  Follow-up by: Judith Part MD,  December 28, 2009 3:03 PM  Additional Follow-up for Phone Call Additional follow up Details #1::        Left message on machine for patient to call back. Sydell Axon LPN  December 28, 2009 5:00 PM   Advised pt, she was agreeable. Additional Follow-up by: Lowella Petties CMA,  December 29, 2009 10:57 AM   New Allergies: ! ZOCOR New Allergies: ! ZOCOR

## 2010-05-01 NOTE — Assessment & Plan Note (Signed)
Summary: CPX/CLE  R/S FROM 2/8   Vital Signs:  Patient profile:   55 year old female Height:      63 inches Weight:      167 pounds BMI:     29.69 Temp:     97.8 degrees F oral Pulse rate:   72 / minute Pulse rhythm:   regular BP sitting:   134 / 88  (left arm) Cuff size:   regular  Vitals Entered By: Lewanda Rife LPN (May 17, 2009 3:50 PM)  History of Present Illness: here for health mt exam   is doing about the same overall  is generally miserable with various problems  hurts chronically and cannot sleep at all and snores (does not have sleep apnea)  is on medicare now  is tired all the time   looked into seeing a psychologist -cannot afford the 35 $ copay saw counselor in past - did some hypnosis   wt is up 1 lb  bp 134/88 today -- HTN has been well controlled has been up and down with migraines and injection - new medicine is not helping   due to check chol - last LDL was in the 150s   vit D -- never seems to go up , Dr Lacey Jensen tried to supplement her  coming off of it has make her pain worse - is D3 10,000 international units per day  needs level checked   colonosc was nl in 07 with hemorroids   had mam and breast bx this dec- was b9 and adenoma self exam -- no changes , breasts hurt like the rest of them  hyst in past partial with pap in 07 - no hx of abn paps or hpv   dexa 11/08 with osteopenia ca and D  TD 09   did get flu shot this year       Allergies: 1)  ! Buspar 2)  ! * Trazadone  Past History:  Past Surgical History: Last updated: 03/20/2009 many breast sx hyst 1990- because of the IC partial sinus sx multiple multiple bladder sx and hydrodistension cervical diskectomy with fusion and plating 2006 ankle fx bilat with signs 2009 EGD 2007- gastritis, duodentitis colonoscopy 2007- hemorrhoids breast bx 12/10- fibroadenoma   Family History: Last updated: 05/05/2008 father- MI in early 73s, died at 10, DM, ETOH early in  life mother- fibromyalgia grandparents P - DM GM with stomach cancer uncle colon cancer brother died in childhood from electrical accident  Social History: Last updated: 10/29/2007 is disabled - (from IC) some stretching exercise class married no children Patient has never smoked.  Alcohol Use - no Illicit Drug Use - no  Risk Factors: Smoking Status: never (10/29/2007)  Past Medical History: Interstitial cystitis  pelvic floor dysfunction arthritis vit D def                                                                                     urology- Evans ankle fx times 2  GI- Gessner chronic fatigue                                                                          rheum- Devishwar                                                                                                  headache - Dr Sharene Skeans  hand tendonitis migranes PFO mild fibromyalgia Hyperlipidemia Hypertension "complicated migraine"- Dohmeier Hypersomnia IBS- constipation predominant Hemorrhoids osteopenia   Review of Systems General:  Denies fatigue, fever, loss of appetite, and malaise. Eyes:  Denies blurring and eye pain. ENT:  Denies sinus pressure and sore throat. CV:  Denies chest pain or discomfort, lightheadness, and palpitations. Resp:  Denies cough and wheezing. GI:  Complains of gas and indigestion; denies abdominal pain and vomiting. GU:  Denies dysuria, hematuria, and urinary frequency. MS:  Complains of joint pain, muscle aches, cramps, and stiffness; denies joint redness, joint swelling, and muscle weakness. Derm:  Denies itching, lesion(s), poor wound healing, and rash. Neuro:  Denies numbness and tingling. Psych:  Complains of anxiety and depression; denies panic attacks, sense of great danger, and suicidal thoughts/plans. Endo:  Denies excessive thirst and excessive urination. Heme:  Denies abnormal bruising  and bleeding.  Physical Exam  General:  overwt and generally fatiged appearing Head:  normocephalic, atraumatic, and no abnormalities observed.   Eyes:  vision grossly intact, pupils equal, pupils round, and pupils reactive to light.  no conjunctival pallor, injection or icterus  Ears:  R ear normal and L ear normal.   Nose:  no external deformity, nose piercing noted, no external erythema, and no nasal discharge.   Mouth:  pharynx pink and moist.   Neck:  supple with full rom and no masses or thyromegally, no JVD or carotid bruit  Chest Wall:  diffusely tender everywhere Breasts:  No mass, nodules, thickening, tenderness, bulging, retraction, inflamation, nipple discharge or skin changes noted.   Lungs:  Normal respiratory effort, chest expands symmetrically. Lungs are clear to auscultation, no crackles or wheezes. Heart:  Normal rate and regular rhythm. S1 and S2 normal without gallop, murmur, click, rub or other extra sounds. Abdomen:  Bowel sounds positive,abdomen soft and non-tender without masses, organomegaly or hernias noted. no renal bruits  Msk:  No deformity or scoliosis noted of thoracic or lumbar spine.   no acute joint changes diffusely tender everywhere - soft tissues and muscles all of her movements are slow and deliberate Pulses:  R and L carotid,radial,femoral,dorsalis pedis and posterior tibial pulses are full and equal bilaterally Extremities:  No clubbing, cyanosis, edema, or deformity noted with normal full range of motion of all joints.   Neurologic:  sensation intact to light touch, gait normal, and DTRs symmetrical and normal.   Skin:  Intact without suspicious lesions or  rashes Cervical Nodes:  No lymphadenopathy noted Axillary Nodes:  No palpable lymphadenopathy Inguinal Nodes:  No significant adenopathy Psych:  depressed affect nl eye contact  tearful at times    Impression & Recommendations:  Problem # 1:  HYPERTENSION (ICD-401.9) Assessment  Unchanged  fairly well controlled without medicines at this point disc sodium avoidance  lab today Orders: Venipuncture (29518) TLB-Lipid Panel (80061-LIPID) TLB-Renal Function Panel (80069-RENAL) TLB-CBC Platelet - w/Differential (85025-CBCD) TLB-Hepatic/Liver Function Pnl (80076-HEPATIC) TLB-TSH (Thyroid Stimulating Hormone) (84443-TSH)  BP today: 134/88 Prior BP: 126/80 (11/18/2008)  Labs Reviewed: K+: 4.4 (05/06/2008) Creat: : 0.61 (05/06/2008)   Chol: 256 (05/06/2008)   HDL: 62 (05/06/2008)   LDL: 153 (05/06/2008)   TG: 205 (05/06/2008)  Problem # 2:  HYPERLIPIDEMIA (ICD-272.4) Assessment: Unchanged  chol is high- pt wants to avoid med/afraid of side eff disc goal of LDL under 130 disc low sat fat diet  lab today and update Orders: Venipuncture (84166) TLB-Lipid Panel (80061-LIPID) TLB-Renal Function Panel (80069-RENAL) TLB-CBC Platelet - w/Differential (85025-CBCD) TLB-Hepatic/Liver Function Pnl (80076-HEPATIC) TLB-TSH (Thyroid Stimulating Hormone) (84443-TSH)  Labs Reviewed: SGOT: 13 (05/06/2008)   SGPT: 13 (05/06/2008)   HDL:62 (05/06/2008), 51.5 (04/23/2007)  LDL:153 (05/06/2008), DEL (04/23/2007)  Chol:256 (05/06/2008), 225 (04/23/2007)  Trig:205 (05/06/2008), 218 (04/23/2007)  Problem # 3:  FIBROMYALGIA (ICD-729.1) Assessment: Unchanged poor control/ chronic pain and resulting depression- will try again to ref to counseling  Her updated medication list for this problem includes:    Fentanyl 75 Mcg/hr Pt72 (Fentanyl) ..... One patch every other day    Hydrocodone-acetaminophen 10-650 Mg Tabs (Hydrocodone-acetaminophen) .Marland Kitchen... Take by mouth as directed prn  Orders: Psychology Referral (Psychology)  Problem # 4:  OSTEOPENIA (ICD-733.90) Assessment: Unchanged due for dexa check D level - expect low  continue current dose and get outdoors The following medications were removed from the medication list:    Vitamin D (ergocalciferol) 50000 Unit Caps  (Ergocalciferol) .Marland Kitchen... 1 by mouth every week Her updated medication list for this problem includes:    Vitamin D3 10000 Unit Caps (Cholecalciferol) .Marland Kitchen... 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 1 by mouth once daily Her updated medication list for this problem includes:    Estradiol 2 Mg Tabs (Estradiol) .Marland Kitchen... 1 1/2 by mouth once daily  Complete Medication List: 1)  Hydroxyzine Hcl 50 Mg Tabs (Hydroxyzine hcl) .... Take 1 by mouth at bedtime 2)  Diazepam 5 Mg Tabs (Diazepam) .... 1/2 in the am and 1 in the pm 3)  Elmiron 100 Mg Caps (Pentosan polysulfate sodium) .... Two by mouth two times a day 4)  Fentanyl 75 Mcg/hr Pt72 (Fentanyl) .... One patch every other day 5)  Hydrocodone-acetaminophen 10-650 Mg Tabs (Hydrocodone-acetaminophen) .... Take by mouth as directed prn 6)  Nitrofurantoin Macrocrystal 100 Mg Caps (Nitrofurantoin macrocrystal) .... One by mouth once daily more as needed 7)  Wellbutrin Xl 150 Mg Tb24 (Bupropion hcl) .... One by mouth once daily 8)  Lidoderm 5 % Ptch (Lidocaine) .... Use as  directed 9)  Finacea 15 % Gel (Azelaic acid) .... Apply to face bid 10)  Miralax Powd (Polyethylene glycol 3350) .Marland Kitchen.. 1-2 times/day 11)  Pepcid 40 Mg Tabs (Famotidine) .Marland Kitchen.. 1 two times a day 12)  Vitamin D3 10000 Unit Caps (Cholecalciferol) .... Take one  daily 13)  Zonisamide 25 Mg Caps (Zonisamide) .... Take two daily 14)  Amitiza 8 Mcg Caps (Lubiprostone) .... Take two daily 15)  Estradiol 2 Mg Tabs (Estradiol) .Marland Kitchen.. 1 1/2 by mouth once daily  Other Orders: T-Vitamin D (25-Hydroxy) [phone]) Specimen Handling (09811)  Patient Instructions: 1)  we will do a counseling referral at check out  2)  we will do dexa referral at check out  3)  please keep working on healthy lifestyle habits  4)  labs today  Prescriptions: ESTRADIOL 2 MG TABS (ESTRADIOL) 1 1/2 by mouth once daily  #3 months x 3   Entered and Authorized by:   Judith Part MD   Signed by:   Judith Part MD on 05/17/2009   Method used:   Print then Give to Patient   RxID:   [id number] PREMARIN 0.3 MG  TABS (ESTROGENS CONJUGATED) 1 by mouth once daily  #90 x 3   Entered and Authorized by:   Judith Part MD   Signed by:   Judith Part MD on 05/17/2009   Method used:   Print then Give to Patient   RxID:   [id number]   Current Allergies (reviewed today): ! BUSPAR ! * TRAZADONE

## 2010-05-01 NOTE — Progress Notes (Signed)
Summary: regarding cholesterol medicine  Phone Note Call from Patient Call back at Home Phone [phone]   Caller: Patient Call For: Judith Part MD Summary of Call: Pt stopped taking zocor and does feel some better.  She is asking if she needs to try something else or just wait.  She says she has changed her diet over the last 6 months.  [facility] [address] Initial call taken by: Lowella Petties CMA,  January 15, 2010 12:19 PM  Follow-up for Phone Call        lets see what her labs look like and then decide  Follow-up by: Judith Part MD,  January 15, 2010 1:44 PM  Additional Follow-up for Phone Call Additional follow up Details #1::        Patient advised.Consuello Masse CMA   Additional Follow-up by: Benny Lennert CMA [name]),  January 15, 2010 2:11 PM

## 2010-05-01 NOTE — Letter (Signed)
Summary: Results Follow up Letter  [place] at [facility]  [address] [place], [place] [address]   Phone: [phone]  Fax: [phone]    05/23/2009 MRN: [id number]    [facility] [address] [place], [place]  [address]    Dear Ms. Bogosian,  The following are the results of your recent test(s):  Test         Result    Pap Smear:        Normal _____  Not Normal _____ Comments: ______________________________________________________ Cholesterol: LDL(Bad cholesterol):         Your goal is less than:         HDL (Good cholesterol):       Your goal is more than: Comments:  ______________________________________________________ Mammogram:        Normal _____  Not Normal _____ Comments:  ___________________________________________________________________ Hemoccult:        Normal _____  Not normal _______ Comments:    _____________________________________________________________________ Other Tests: Bone density shows mild osteopenia. Dr. Milinda Antis will review in more detail at next followup visit. Continue Calcium and Vitamin D.    We routinely do not discuss normal results over the telephone.  If you desire a copy of the results, or you have any questions about this information we can discuss them at your next office visit.   Sincerely,    Roxy Manns, MD  MT/ri

## 2010-05-01 NOTE — Progress Notes (Signed)
Summary: needs mammogram  Phone Note Call from Patient Call back at Home Phone [phone]   Caller: Patient Call For: Judith Part MD Summary of Call: Pt is due for mammogram.  She was found to have a mass in her right breast last year, had needle bx and everything was ok.  Now she has drainage from her left nipple.  [facility] is asking what tests do you want the pt to have?  Would you  want her to have an ultrasound? Initial call taken by: Lowella Petties CMA, AAMA,  March 07, 2010 11:26 AM  Follow-up for Phone Call        will do order for mam and [place] for both breasts dx  we may need to have her see her surgeon about the nipple d/c will start with the imaging order done for [facility] Follow-up by: Judith Part MD,  March 07, 2010 1:46 PM  New Problems: BREAST MASS, HX OF (ICD-V15.89)   New Problems: BREAST MASS, HX OF (ICD-V15.89)

## 2010-05-01 NOTE — Letter (Signed)
Summary: [facility]  [facility]   Imported By: Lanelle Bal 04/07/2009 12:32:51  _____________________________________________________________________  External Attachment:    Type:   Image     Comment:   External Document

## 2010-05-01 NOTE — Letter (Signed)
Summary: [facility]  [facility]   Imported By: Lanelle Bal 06/22/2009 12:53:03  _____________________________________________________________________  External Attachment:    Type:   Image     Comment:   External Document

## 2010-05-01 NOTE — Letter (Signed)
Summary: [facility]  [facility]   Imported By: Lanelle Bal 11/21/2009 08:56:42  _____________________________________________________________________  External Attachment:    Type:   Image     Comment:   External Document

## 2010-05-01 NOTE — Letter (Signed)
Summary: [facility]  [facility]   Imported By: Lanelle Bal 10/23/2009 11:35:40  _____________________________________________________________________  External Attachment:    Type:   Image     Comment:   External Document

## 2010-05-03 NOTE — Letter (Signed)
Summary: Results Follow up Letter  [place] at [facility]  [address] [place], [place] [address]   Phone: [phone]  Fax: [phone]    03/15/2010 MRN: [id number]    [facility] [address] [place], [place]  [address]    Dear Ms. Leonetti,  The following are the results of your recent test(s):  Test         Result    Pap Smear:        Normal _____  Not Normal _____ Comments: ______________________________________________________ Cholesterol: LDL(Bad cholesterol):         Your goal is less than:         HDL (Good cholesterol):       Your goal is more than: Comments:  ______________________________________________________ Mammogram:        Normal ___X__  Not Normal _____ Comments:Repeat in one year.   ___________________________________________________________________ Hemoccult:        Normal _____  Not normal _______ Comments:    _____________________________________________________________________ Other Tests:    We routinely do not discuss normal results over the telephone.  If you desire a copy of the results, or you have any questions about this information we can discuss them at your next office visit.   Sincerely,    Idamae Schuller Tower,MD  MT/ri

## 2010-05-03 NOTE — Progress Notes (Signed)
Summary: prior Berkley Harvey is needed for mentax  Phone Note From Pharmacy   [facility]  [phone] [employer] Summary of Call: Prior Berkley Harvey is needed for mentax, form is on your shelf. Initial call taken by: Lowella Petties CMA, AAMA,  April 20, 2010 4:38 PM  Follow-up for Phone Call        I went ahead and changed it to clotrimazole anyway so will not do prior auth at this time  let me know how it works   (the clotrimazole px is attatched to the office note so if not already called in please do so thanks!) Follow-up by: Judith Part MD,  April 20, 2010 5:10 PM  Additional Follow-up for Phone Call Additional follow up Details #1::        Rx sent in as directed. Forwarded to [employer] as Lorain Childes.  Additional Follow-up by: [name] CMA [name]),  April 20, 2010 5:18 PM

## 2010-05-03 NOTE — Progress Notes (Signed)
Summary: Schedule recall Colonoscopy   Phone Note Outgoing Call Call back at Home Phone [phone]   Call placed by: Christie Nottingham CMA [name]),  April 05, 2010 1:57 PM Call placed to: Patient Summary of Call: Left a message for patient to call us back regarding scheduling her recall Colonoscopy possibly with propofol. Initial call taken by: Christie Nottingham CMA [name]),  April 05, 2010 1:59 PM  Follow-up for Phone Call        Informed pt the reasoning of scheduling her next procedure with propofol vs concsious sedation. Pt agreed that since she has been taking Fentanyl for 10 years that her body has built up a tolerance to that medication. Pt schedule her colonoscopy on 05/01/10 and her nurse visit on 04/24/10. Pt verbalized understanding of both appts. Follow-up by: Christie Nottingham CMA (AAMA),  April 06, 2010 12:00 PM

## 2010-05-03 NOTE — Assessment & Plan Note (Signed)
Summary: SKIN BETWEEN TOES ARE PEELING DEEPLY / LFW   Vital Signs:  Patient profile:   55 year old female Weight:      153 pounds BMI:     27.20 Temp:     98.1 degrees F oral Pulse rate:   64 / minute Pulse rhythm:   regular BP sitting:   138 / 84  (left arm) Cuff size:   regular  Vitals Entered By: Sydell Axon LPN (April 16, 2010 3:10 PM) CC: skin between toes are peeling   History of Present Illness: skin between toes has been peeling -- noticed it on tues night  also red mark on her R large toe  all on the R foot  also some on bottom of foot  not itchy   uses amylactin lotion since she noticed it -- this helped heels from cracking  uses hydrogen peroxide  also tea tree oil - on heels   has never had foot fungus before   this am was first pedicure ever   tends not to go barefoot   wears heavy socks or warm shoes - but feet do not sweat a lot     Allergies: 1)  ! Buspar 2)  ! * Trazadone 3)  ! Zocor  Past History:  Past Medical History: Last updated: 05/17/2009 Interstitial cystitis  pelvic floor dysfunction arthritis vit D def                                                                                     urology- Evans ankle fx times 2                                                                         GI- Gessner chronic fatigue                                                                          rheum- Devishwar                                                                                                  headache - Dr Sharene Skeans  hand tendonitis migranes PFO mild fibromyalgia Hyperlipidemia Hypertension "complicated migraine"- Dohmeier Hypersomnia IBS- constipation predominant Hemorrhoids osteopenia   Past  Surgical History: Last updated: 03/20/2009 many breast sx hyst 1990- because of the IC partial sinus sx multiple multiple bladder sx and hydrodistension cervical diskectomy with fusion and plating 2006 ankle fx bilat with  signs 2009 EGD 2007- gastritis, duodentitis colonoscopy 2007- hemorrhoids breast bx 12/10- fibroadenoma   Family History: Last updated: 05/05/2008 father- MI in early 46s, died at 1, DM, ETOH early in life mother- fibromyalgia grandparents P - DM GM with stomach cancer uncle colon cancer brother died in childhood from electrical accident  Social History: Last updated: 10/29/2007 is disabled - (from IC) some stretching exercise class married no children Patient has never smoked.  Alcohol Use - no Illicit Drug Use - no  Risk Factors: Smoking Status: never (10/29/2007)  Review of Systems General:  Denies fatigue, loss of appetite, and malaise. Eyes:  Denies blurring. MS:  Denies joint redness and joint swelling. Derm:  Complains of poor wound healing; denies rash. Neuro:  Denies numbness and tingling. Heme:  Denies abnormal bruising and bleeding.  Physical Exam  General:  Well-developed,well-nourished,in no acute distress; alert,appropriate and cooperative throughout examination Head:  normocephalic, atraumatic, and no abnormalities observed.   Eyes:  no injection Mouth:  pharynx pink and moist.   Heart:  Normal rate and regular rhythm. S1 and S2 normal without gallop, murmur, click, rub or other extra sounds. Msk:  no new joint changes  Pulses:  nl pedal pulses  Extremities:  no cce  Neurologic:  sensation intact to light touch and gait normal.   Skin:  cracking and maceration of skin between lateral toes of R foot only, also some peeling over plantar surface nails appear nl  some redness of skin in medial mcp area  Psych:  normal affect, talkative and pleasant    Impression & Recommendations:  Problem # 1:  TINEA PEDIS (ICD-110.4) Assessment New with pattern of skin cracking/ maceration especially between 4th/ 5th toes on R foot  disc tx of this  keeping feet dry/ use mentax --(changed to clotrimazole due to insurance coverage) see inst  update if not  better in 2-4 weeks or if worse   Complete Medication List: 1)  Hydroxyzine Hcl 50 Mg Tabs (Hydroxyzine hcl) .... Take 2 by mouth at bedtime 2)  Diazepam 5 Mg Tabs (Diazepam) .... 1/2 in the am and 1 in the pm 3)  Elmiron 100 Mg Caps (Pentosan polysulfate sodium) .... Two by mouth two times a day 4)  Hydrocodone-acetaminophen 10-650 Mg Tabs (Hydrocodone-acetaminophen) .... Take by mouth as directed prn 5)  Wellbutrin Xl 150 Mg Tb24 (Bupropion hcl) .... One by mouth twice  daily 6)  Lidoderm 5 % Ptch (Lidocaine) .... Use as directed 7)  Finacea 15 % Gel (Azelaic acid) .... Apply to face twice a day 8)  Miralax Powd (Polyethylene glycol 3350) .Marland Kitchen.. 1-2 times/day 9)  Pepcid 40 Mg Tabs (Famotidine) .Marland Kitchen.. 1 two times a day 10)  Vitamin D3 10000 Unit Caps (Cholecalciferol) .... Take one daily 11)  Amitiza 8 Mcg Caps (Lubiprostone) .... Take two daily 12)  Estradiol 2 Mg Tabs (Estradiol) .Marland Kitchen.. 1 1/2 by mouth once daily 13)  Fentanyl 50 Mcg/hr Pt72 (Fentanyl) .... One patch every other day. 14)  Vitamin D (ergocalciferol) 50000 Unit Caps (Ergocalciferol) .... One capsule by mouth monthly. 15)  Baclofen 10 Mg Tabs (Baclofen) .... As directed as needed. 16)  Atenolol 100 Mg Tabs (Atenolol) .... Take 1 tablet by mouth once a day 17)  Clotrimazole 1 % Crea (Clotrimazole) .... Apply to  affected area two times a day  Patient Instructions: 1)  get feet good and dry after showering  2)  use dove soap for sensitive skin  3)  use mentax cream as directed  4)  can also use moisturizer -- scent and color free like eucerin or lubriderm for sensitive skin  5)  update me in 1 month if not improved or at any point if it gets worse  6)  spray shoes with any foot antifungal product over the counter  Prescriptions: CLOTRIMAZOLE 1 % CREA (CLOTRIMAZOLE) apply to affected area two times a day  #1 medium x 1   Entered by:   [name] CMA (AAMA)   Authorized by:   Judith Part MD   Signed by:   Selena Batten Dance CMA [name])  on 04/20/2010   Method used:   [facility] [phone]* (retail)       [address] [place], [place]  [address]       Ph: [phone] or [phone]       Fax: [phone]   RxID:   [id number] MENTAX 1 % CREA (BUTENAFINE HCL) apply to affected areas two times a day for 1 week and then once daily for 1 month  #1 medium x 1   Entered and Authorized by:   Judith Part MD   Signed by:   Judith Part MD on 04/16/2010   Method used:   [facility]        [facility] [phone]* (retail)       [address] [place], [place]  [address]       Ph: [phone] or [phone]       Fax: [phone]   RxID:   [id number]    Orders Added: 1)  Est. Patient Level III [[address]]    Current Allergies (reviewed today): ! BUSPAR ! * TRAZADONE ! ZOCOR

## 2010-05-03 NOTE — Letter (Signed)
Summary: [facility] Instructions  [facility]  [address] [place], [place] [address]   Phone: [phone]  Fax: [phone]       Lindsey [name]    09/01/1955    MRN: [id number]        Procedure Day /Date:  Tuesday 05/01/2010     Arrival Time: 2:00 pm     Procedure Time: 3:00 pm     Location of Procedure:                    _x _  [facility] (4th Floor)                        PREPARATION FOR COLONOSCOPY WITH MOVIPREP   Starting 5 days prior to your procedure Thursday 1/26 do not eat nuts, seeds, popcorn, corn, beans, peas,  salads, or any raw vegetables.  Do not take any fiber supplements (e.g. Metamucil, Citrucel, and Benefiber).  THE DAY BEFORE YOUR PROCEDURE         DATE: Monday 1/30  1.  Drink clear liquids the entire day-NO SOLID FOOD  2.  Do not drink anything colored red or purple.  Avoid juices with pulp.  No orange juice.  3.  Drink at least 64 oz. (8 glasses) of fluid/clear liquids during the day to prevent dehydration and help the prep work efficiently.  CLEAR LIQUIDS INCLUDE: Water Jello Ice Popsicles Tea (sugar ok, no milk/cream) Powdered fruit flavored drinks Coffee (sugar ok, no milk/cream) Gatorade Juice: apple, white grape, white cranberry  Lemonade Clear bullion, consomm, broth Carbonated beverages (any kind) Strained chicken noodle soup Hard Candy                             4.  In the morning, mix first dose of MoviPrep solution:    Empty 1 Pouch A and 1 Pouch B into the disposable container    Add lukewarm drinking water to the top line of the container. Mix to dissolve    Refrigerate (mixed solution should be used within 24 hrs)  5.  Begin drinking the prep at 5:00 p.m. The MoviPrep container is divided by 4 marks.   Every 15 minutes drink the solution down to the next mark (approximately 8 oz) until the full liter is complete.   6.  Follow completed prep with 16 oz of clear liquid of your choice (Nothing red  or purple).  Continue to drink clear liquids until bedtime.  7.  Before going to bed, mix second dose of MoviPrep solution:    Empty 1 Pouch A and 1 Pouch B into the disposable container    Add lukewarm drinking water to the top line of the container. Mix to dissolve    Refrigerate  THE DAY OF YOUR PROCEDURE      DATE: Tuesday 1/31  Beginning at 10:00 a.m. (5 hours before procedure):         1. Every 15 minutes, drink the solution down to the next mark (approx 8 oz) until the full liter is complete.  2. Follow completed prep with 16 oz. of clear liquid of your choice.    3. You may drink clear liquids until 1:00 pm (2 HOURS BEFORE PROCEDURE).   MEDICATION INSTRUCTIONS  Unless otherwise instructed, you should take regular prescription medications with a small sip of water   as early as possible the morning of your  procedure.         OTHER INSTRUCTIONS  You will need a responsible adult at least 54 years of age to accompany you and drive you home.   This person must remain in the waiting room during your procedure.  Wear loose fitting clothing that is easily removed.  Leave jewelry and other valuables at home.  However, you may wish to bring a book to read or  an iPod/MP3 player to listen to music as you wait for your procedure to start.  Remove all body piercing jewelry and leave at home.  Total time from sign-in until discharge is approximately 2-3 hours.  You should go home directly after your procedure and rest.  You can resume normal activities the  day after your procedure.  The day of your procedure you should not:   Drive   Make legal decisions   Operate machinery   Drink alcohol   Return to work  You will receive specific instructions about eating, activities and medications before you leave.    The above instructions have been reviewed and explained to me by   Ulis Rias RN  April 24, 2010 1:54 PM     I fully understand and can verbalize  these instructions _____________________________ Date _________

## 2010-05-03 NOTE — Miscellaneous (Signed)
Summary: Lec previsir  Clinical Lists Changes  Medications: Added new medication of MOVIPREP 100 GM  SOLR (PEG-KCL-NACL-NASULF-NA ASC-C) As per prep instructions. - Signed Rx of MOVIPREP 100 GM  SOLR (PEG-KCL-NACL-NASULF-NA ASC-C) As per prep instructions.;  #1 x 0;  Signed;  Entered by: Ulis Rias RN;  Authorized by: Iva Boop MD, [facility];  Method used: [facility] to [facility] [phone]*, [address], [place], [place], [place]  [address], Ph: [phone] or [phone], Fax: [phone] Observations: Added new observation of ALLERGY REV: Done (04/24/2010 13:21)    Prescriptions: MOVIPREP 100 GM  SOLR (PEG-KCL-NACL-NASULF-NA ASC-C) As per prep instructions.  #1 x 0   Entered by:   Ulis Rias RN   Authorized by:   Iva Boop MD, [facility]   Signed by:   Ulis Rias RN on 04/24/2010   Method used:   [facility] to        [facility] [phone]* (retail)       [address] [place], [place]  [address]       Ph: [phone] or [phone]       Fax: [phone]   RxID:   [id number]

## 2010-05-03 NOTE — Progress Notes (Signed)
Summary: mentax not covered by insurance  Phone Note From Pharmacy   [facility] [phone] Summary of Call: Mentax is not covered by pt's insurance.  They will cover nizoral, spectrozole, clotrimazole.  Please advise.        Lowella Petties CMA, AAMA  April 16, 2010 4:02 PM   Follow-up for Phone Call        can try clotrimazole -- will px written on EMR for call in  Follow-up by: Judith Part MD,  April 16, 2010 4:19 PM  Additional Follow-up for Phone Call Additional follow up Details #1::        Could you please write Rx for this? I can't find it to call it in! Thanks!  sorry I attatched it to her office visit of same day because that was still open-- thanks  -MT Additional Follow-up by: [name] CMA [name]),  April 17, 2010 8:55 AM

## 2010-05-04 NOTE — Letter (Signed)
Summary: [facility]   Imported By: Sherian Rein 12/07/2009 09:46:21  _____________________________________________________________________  External Attachment:    Type:   Image     Comment:   External Document

## 2010-05-04 NOTE — Letter (Signed)
Summary: [facility]  [facility]   Imported By: Lanelle Bal 02/21/2010 15:05:09  _____________________________________________________________________  External Attachment:    Type:   Image     Comment:   External Document

## 2010-05-04 NOTE — Letter (Signed)
Summary: [facility]  [facility]   Imported By: Lanelle Bal 07/06/2009 13:53:42  _____________________________________________________________________  External Attachment:    Type:   Image     Comment:   External Document

## 2010-05-04 NOTE — Letter (Signed)
Summary: [facility]   Imported By: Sherian Rein 03/06/2010 09:12:08  _____________________________________________________________________  External Attachment:    Type:   Image     Comment:   External Document

## 2010-05-09 NOTE — Procedures (Signed)
Summary: Colonoscopy  Patient: Lindsey [name] Note: All result statuses are Final unless otherwise noted.  Tests: (1) Colonoscopy (COL)   COL Colonoscopy           DONE     [facility]     [address]     [place], [place]  [address]           COLONOSCOPY PROCEDURE REPORT           PATIENT:  Lindsey [name], Lindsey [name]  MR#:  [id number]     BIRTHDATE:  1955-09-10, 54 yrs. old  GENDER:  female     ENDOSCOPIST:  Iva Boop, MD, [facility]           PROCEDURE DATE:  05/01/2010     PROCEDURE:  Average-risk screening colonoscopy     G0121     ASA CLASS:  Class II     INDICATIONS:  Routine Risk Screening     MEDICATIONS:   MAC sedation, administered by CRNA, propofol     (Diprivan) 200 mg IV           DESCRIPTION OF PROCEDURE:   After the risks benefits and     alternatives of the procedure were thoroughly explained, informed     consent was obtained.  Digital rectal exam was performed and     revealed no abnormalities.   The LB [id number] endoscope     was introduced through the anus and advanced to the cecum, which     was identified by both the appendix and ileocecal valve, without     limitations.  The quality of the prep was good, using MoviPrep.     The instrument was then slowly withdrawn as the colon was fully     examined. Insertion: 5:45 minutes Withdrawal: 13:28 minutes     <<PROCEDUREIMAGES>>           FINDINGS:  A normal appearing cecum, ileocecal valve, and     appendiceal orifice were identified. The ascending, hepatic     flexure, transverse, splenic flexure, descending, sigmoid colon,     and rectum appeared unremarkable.   Retroflexed views in the     rectum revealed external hemorrhoids.    The scope was then     withdrawn from the patient and the procedure completed.           COMPLICATIONS:  None     ENDOSCOPIC IMPRESSION:     1) Normal colon     2) External hemorrhoids     3) Excellent prep           REPEAT EXAM:  In 10 year(s) for routine screening  colonoscopy.           Iva Boop, MD, [facility]           CC:  The Patient           n.     eSIGNED:   Iva Boop at 05/01/2010 04:04 PM           Roberts Gaudy, [phone]  Note: An exclamation mark (!) indicates a result that was not dispersed into the flowsheet. Document Creation Date: 05/01/2010 4:05 PM _______________________________________________________________________  (1) Order result status: Final Collection or observation date-time: 05/01/2010 15:55 Requested date-time:  Receipt date-time:  Reported date-time:  Referring Physician:   Ordering Physician: Stan Head [phone]) Specimen Source:  Source: Launa Grill Order Number: [id number] Lab site:   Appended Document: Colonoscopy    Clinical Lists  Changes  Observations: Added new observation of COLONNXTDUE: 04/2020 (05/01/2010 16:45)

## 2010-05-15 ENCOUNTER — Encounter: Payer: Self-pay | Admitting: Family Medicine

## 2010-05-23 ENCOUNTER — Ambulatory Visit (INDEPENDENT_AMBULATORY_CARE_PROVIDER_SITE_OTHER): Payer: Medicare Other | Admitting: Family Medicine

## 2010-05-23 ENCOUNTER — Encounter: Payer: Self-pay | Admitting: Family Medicine

## 2010-05-23 DIAGNOSIS — J029 Acute pharyngitis, unspecified: Secondary | ICD-10-CM | POA: Insufficient documentation

## 2010-05-23 DIAGNOSIS — J069 Acute upper respiratory infection, unspecified: Secondary | ICD-10-CM

## 2010-05-24 ENCOUNTER — Encounter: Payer: Self-pay | Admitting: Family Medicine

## 2010-05-24 ENCOUNTER — Telehealth: Payer: Self-pay | Admitting: Family Medicine

## 2010-05-29 NOTE — Assessment & Plan Note (Signed)
Summary: CONGESTION,ST/CLE   [employer] MEDICARE ADVANTAGE   Vital Signs:  Patient profile:   55 year old female Weight:      150.50 pounds BMI:     26.76 Temp:     98.7 degrees F oral Pulse rate:   74 / minute Pulse rhythm:   regular BP sitting:   122 / 80  (left arm) Cuff size:   regular  Vitals Entered By: Selena Batten Dance CMA [name]) (May 23, 2010 11:48 AM) CC: Congestion,sore throat,achy   History of Present Illness: has cold symptoms that are worsening  bad sore throat and congestion  achey all over - suspects fever / also flushed face  ears are popping some facial pain and headaches -- right behind and under eyes  also some ulcers on top gums  has had this for about 3 days   not a lot off mucous from nose  cough is very little    Allergies: 1)  ! Buspar 2)  ! * Trazadone 3)  ! Zocor  Past History:  Past Medical History: Last updated: 05/17/2009 Interstitial cystitis  pelvic floor dysfunction arthritis vit D def                                                                                     urology- Evans ankle fx times 2                                                                         GI- Gessner chronic fatigue                                                                          rheum- Devishwar                                                                                                  headache - Dr Sharene Skeans  hand tendonitis migranes PFO mild fibromyalgia Hyperlipidemia Hypertension "complicated migraine"- Dohmeier Hypersomnia IBS- constipation predominant Hemorrhoids osteopenia   Past Surgical History: Last updated: 03/20/2009 many breast sx hyst 1990- because of the IC partial sinus sx multiple multiple bladder sx and hydrodistension cervical diskectomy with fusion and plating 2006 ankle fx bilat with signs 2009 EGD 2007- gastritis, duodentitis colonoscopy  2007- hemorrhoids breast bx 12/10- fibroadenoma   Family History: Last  updated: 05/05/2008 father- MI in early 74s, died at 65, DM, ETOH early in life mother- fibromyalgia grandparents P - DM GM with stomach cancer uncle colon cancer brother died in childhood from electrical accident  Social History: Last updated: 10/29/2007 is disabled - (from IC) some stretching exercise class married no children Patient has never smoked.  Alcohol Use - no Illicit Drug Use - no  Risk Factors: Smoking Status: never (10/29/2007)  Review of Systems General:  Complains of chills, fatigue, and malaise. Eyes:  Denies blurring, discharge, and eye irritation. ENT:  Complains of nasal congestion, postnasal drainage, sinus pressure, and sore throat; denies earache. CV:  Denies chest pain or discomfort and palpitations. Resp:  Complains of cough; denies pleuritic, shortness of breath, sputum productive, and wheezing. GI:  Denies diarrhea, nausea, and vomiting. Derm:  Denies rash.  Physical Exam  General:  fatigued but well app Head:  no sinus tenderness normocephalic, atraumatic, and no abnormalities observed.   Eyes:  vision grossly intact, pupils equal, pupils round, pupils reactive to light, and no injection.   Ears:  R ear normal and L ear normal.   Nose:  nares are injected and congested bilaterally  Mouth:  throat is mildly erythematous no ulcers or d/c some apthous ulcers on upper gums Neck:  supple with full rom and no masses or thyromegally, no JVD or carotid bruit  Chest Wall:  No deformities, masses, or tenderness noted. Lungs:  Normal respiratory effort, chest expands symmetrically. Lungs are clear to auscultation, no crackles or wheezes. Heart:  Normal rate and regular rhythm. S1 and S2 normal without gallop, murmur, click, rub or other extra sounds. Skin:  Intact without suspicious lesions or rashes Cervical Nodes:  No lymphadenopathy noted Psych:  fatigued but pleasant   Impression & Recommendations:  Problem # 1:  VIRAL URI  (ICD-465.9) Assessment New in light of severe st (neg rapid strep)- will send cx head and chest congestion and aches no fever today- doubt flu can try hydrocodone for ST recommend sympt care- see pt instructions   pt advised to update me if symptoms worsen or do not improve - esp if facial pain / fever / cough  Problem # 2:  SORE THROAT (ICD-462) Assessment: New suspect this is from viral uri rapid strep is neg because this is worst symptom will also send throat cx  Orders: Rapid Strep (16109) T-Culture, Throat (60454-[address]11) Specimen Handling (91478)  Complete Medication List: 1)  Hydroxyzine Hcl 50 Mg Tabs (Hydroxyzine hcl) .... Take 2 by mouth at bedtime 2)  Diazepam 5 Mg Tabs (Diazepam) .... 1/2 in the am and 1 in the pm 3)  Elmiron 100 Mg Caps (Pentosan polysulfate sodium) .... Two by mouth two times a day 4)  Hydrocodone-acetaminophen 10-650 Mg Tabs (Hydrocodone-acetaminophen) .... Take by mouth as directed prn 5)  Wellbutrin Xl 150 Mg Tb24 (Bupropion hcl) .... One by mouth twice  daily 6)  Lidoderm 5 % Ptch (Lidocaine) .... Use as directed 7)  Finacea 15 % Gel (Azelaic acid) .... Apply to face twice a day 8)  Miralax Powd (Polyethylene glycol 3350) .Marland Kitchen.. 1-2 times/day 9)  Pepcid 40 Mg Tabs (Famotidine) .Marland Kitchen.. 1 two times a day 10)  Vitamin D3 10000 Unit Caps (Cholecalciferol) .... Take one daily 11)  Amitiza 8 Mcg Caps (Lubiprostone) .... Take two daily 12)  Estradiol 2 Mg Tabs (Estradiol) .Marland Kitchen.. 1 1/2 by mouth once daily 13)  Fentanyl  50 Mcg/hr Pt72 (Fentanyl) .... One patch every other day. 14)  Vitamin D (ergocalciferol) 50000 Unit Caps (Ergocalciferol) .... One capsule by mouth monthly. 15)  Baclofen 10 Mg Tabs (Baclofen) .... As directed as needed. 16)  Atenolol 100 Mg Tabs (Atenolol) .... Take 1 tablet by mouth once a day 17)  Clotrimazole 1 % Crea (Clotrimazole) .... Apply to affected area two times a day 18)  Botox 200 Unit Solr (Onabotulinumtoxina) .... Injected as  directed for migraine management  Patient Instructions: 1)  hyrdocodone - acetaminophen may help sore throat  2)  can try chloraseptic spray  3)  drink lots of fluids  4)  saline nasal spray for congestion 5)  lots of rest  6)  I think you have a viral syndrome - early  7)  keep me updated 8)  I am sending a throat culture and will update you when it returns  9)  if fever exceeds 102 let me know    Orders Added: 1)  Rapid Strep [16109] 2)  T-Culture, Throat [[id number]] 3)  Specimen Handling [99000] 4)  Est. Patient Level III [91478]    Current Allergies (reviewed today): ! BUSPAR ! * TRAZADONE ! ZOCOR  Laboratory Results  Date/Time Received: May 23, 2010 11:50 AM  Date/Time Reported: May 23, 2010 11:50 AM   Other Tests  Rapid Strep: negative

## 2010-05-29 NOTE — Progress Notes (Signed)
Summary: pt is worse today  Phone Note Call from Patient   Caller: Patient Call For: Lindsey [name] Summary of Call: Pt was seen yesterday and diagnosed with a virus.  She said she is much worse today.  Her throat and her ears are very sore.  Today she has started with nausea.  Has a lot of green and yellow mucus mixed with blood. She said her ears are popping, hurting   She is asking for antibiotic to be called to [facility] [address] Initial call taken by: Lowella Petties CMA, AAMA,  May 24, 2010 11:43 AM  Follow-up for Phone Call        we can do that -- but let her know that if this is a virus it may not help keep me posted on how she feels px written on EMR for call in for zithromax Follow-up by: Lindsey [name],  May 24, 2010 12:34 PM  Additional Follow-up for Phone Call Additional follow up Details #1::        Patient notified and Rx sent in Additional Follow-up by: [name] CMA [name]),  May 24, 2010 12:42 PM    New/Updated Medications: ZITHROMAX Z-PAK 250 MG TABS (AZITHROMYCIN) take by mouth as directed Prescriptions: ZITHROMAX Z-PAK 250 MG TABS (AZITHROMYCIN) take by mouth as directed  #1 pack x 0   Entered by:   [name] CMA (AAMA)   Authorized by:   Lindsey [name]   Signed by:   Selena Batten Dance CMA [name]) on 05/24/2010   Method used:   [facility]ronically to        [facility] [phone]* (retail)       [address] [place], [place]  [address]       Ph: [phone] or [phone]       Fax: [phone]   RxID:   [id number] ZITHROMAX Z-PAK 250 MG TABS (AZITHROMYCIN) take by mouth as directed  #1 pack x 0   Entered and Authorized by:   Lindsey [name]   Signed by:   Lindsey [name] on 05/24/2010   Method used:   Telephoned to ...       [facility] [phone]* (retail)       [address] [place], [place]  [address]       Ph: [phone] or [phone]       Fax: [phone]   RxID:    [id number]

## 2010-06-07 NOTE — Letter (Signed)
Summary: [facility]   [facility]   Imported By: Kassie Mends 05/30/2010 09:11:46  _____________________________________________________________________  External Attachment:    Type:   Image     Comment:   External Document

## 2010-06-07 NOTE — Letter (Signed)
Summary: [facility]  [facility]   Imported By: Maryln Gottron 05/28/2010 10:15:05  _____________________________________________________________________  External Attachment:    Type:   Image     Comment:   External Document

## 2010-07-16 ENCOUNTER — Other Ambulatory Visit: Payer: Self-pay | Admitting: *Deleted

## 2010-07-16 DIAGNOSIS — E7800 Pure hypercholesterolemia, unspecified: Secondary | ICD-10-CM

## 2010-07-25 ENCOUNTER — Other Ambulatory Visit: Payer: Self-pay | Admitting: *Deleted

## 2010-07-25 ENCOUNTER — Other Ambulatory Visit (INDEPENDENT_AMBULATORY_CARE_PROVIDER_SITE_OTHER): Payer: Medicare Other | Admitting: Family Medicine

## 2010-07-25 DIAGNOSIS — E7800 Pure hypercholesterolemia, unspecified: Secondary | ICD-10-CM

## 2010-07-25 LAB — AST: AST: 15 U/L (ref 0–37)

## 2010-07-25 LAB — ALT: ALT: 14 U/L (ref 0–35)

## 2010-07-25 LAB — LIPID PANEL
Cholesterol: 236 mg/dL — ABNORMAL HIGH (ref 0–200)
Total CHOL/HDL Ratio: 4
Triglycerides: 156 mg/dL — ABNORMAL HIGH (ref 0.0–149.0)
VLDL: 31.2 mg/dL (ref 0.0–40.0)

## 2010-07-25 MED ORDER — ESTRADIOL 2 MG PO TABS: ORAL_TABLET | ORAL | Status: DC

## 2010-07-25 MED ORDER — ATENOLOL 100 MG PO TABS: 100.0000 mg | ORAL_TABLET | Freq: Every day | ORAL | Status: DC

## 2010-07-25 NOTE — Telephone Encounter (Signed)
Px written for call in   

## 2010-07-25 NOTE — Telephone Encounter (Signed)
Patient needs new scripts for both of these medications fa

## 2010-07-25 NOTE — Telephone Encounter (Signed)
Faxed to [facility]. Form to fax is on your shelf.

## 2010-07-25 NOTE — Telephone Encounter (Signed)
Rx faxed to [facility].

## 2010-08-09 ENCOUNTER — Encounter: Payer: Self-pay | Admitting: Family Medicine

## 2010-08-09 ENCOUNTER — Ambulatory Visit (INDEPENDENT_AMBULATORY_CARE_PROVIDER_SITE_OTHER): Payer: Medicare Other | Admitting: Family Medicine

## 2010-08-09 VITALS — BP 130/84 | HR 74 | Temp 98.3°F | Ht 64.0 in | Wt 142.1 lb

## 2010-08-09 DIAGNOSIS — N760 Acute vaginitis: Secondary | ICD-10-CM

## 2010-08-09 LAB — HM MAMMOGRAPHY

## 2010-08-09 LAB — HM COLONOSCOPY

## 2010-08-09 MED ORDER — FLUCONAZOLE 150 MG PO TABS: 150.0000 mg | ORAL_TABLET | Freq: Once | ORAL | Status: AC

## 2010-08-09 NOTE — Progress Notes (Signed)
55 yo pt of Dr. Royden Purl with multiple medical conditions, including IC and IBS here for 3 days of vulvular irritation. At first she thought it was related to her IC but symptoms do not occur with urination. More of a constant vulvular burning sensation.  Some scant amount of discharge. No h/o vaginal infections. No fevers, chills, nausea or abdominal pain.  The PMH, PSH, Social History, Family History, Medications, and allergies have been reviewed in [facility], and have been updated if relevant.  ROS: She HPI Patient reports no gastrointestinal bleeding(melena, rectal bleeding), abdominal pain, significant heartburn boel changes,GU symptoms(dysuria, hematuria,pyuria, incontinence) ), Gyn symptoms(abnormal  bleeding , pain)  Physical exam: BP 130/84  Pulse 74  Temp(Src) 98.3 F (36.8 C) (Oral)  Ht 5\' 4"  (1.626 m)  Wt 142 lb 1.9 oz (64.465 kg)  BMI 24.39 kg/m2 Gen:  Alert, pleasant, NAD GU: scant vaginal discharge in vault, no erythema or lesions on vulva or vaginal folds. No visible rashes.  A/P Vaginitis- Wet prep consistent with yeast. Will treat with Diflucan 150 mg x 1. Pt to call on Monday with an update.

## 2010-08-14 NOTE — Procedures (Signed)
NAME:  Lindsey [name], Lindsey [name]                  ACCOUNT NO.:  [id number]   MEDICAL RECORD NO.:  [id number]          PATIENT TYPE:  OUT   LOCATION:  SLEEP CENTER                 FACILITY:  [facility]   PHYSICIAN:  Clinton D. Maple Hudson, MD, FCCP, FACPDATE OF BIRTH:  [birth year] 11, 1955   DATE OF STUDY:  06/07/2007                            NOCTURNAL POLYSOMNOGRAM   REFERRING PHYSICIAN:   REFERRING PHYSICIAN:  Clinton D. Maple Hudson, MD, FCCP, FACP.   INDICATION FOR STUDY:  Hypersomnia with sleep apnea.   EPWORTH SLEEPINESS SCORE:  18/24.  BMI 28.3.  Weight 165 pounds.  Height  64 inches.  Neck 14 inches.   MEDICATIONS:  Home medications charted and reviewed.  Bedtime  medications:  Hydroxyzine, diazepam.   SLEEP ARCHITECTURE:  Total sleep time 433 minutes with sleep efficiency  of 84.5%.  Stage I was 14.1%.  Stage II 65.4%.  Stage III 2.4%.  REM  18.1% of total sleep time.  Sleep latency 38 minutes.  REM latency 57  minutes.  Awake after sleep onset 45 minutes.  Arousal index 1.7.   RESPIRATORY DATA:  Apnea-hypopnea index (AHI) 5.8 per hour, indicating  minimal obstructive sleep apnea/hypopnea syndrome.  There were 42 events  scored, including 19 obstructive apneas, 8 central apneas, and 15  hypopneas.  Most events were while supine.  REM AHI 25.2.   OXYGEN DATA:  Mild snoring with oxygen desaturation to a nadir of 85%.  Mean oxygen saturation through the study was 94.8% on room air.   CARDIAC DATA:  Normal sinus rhythm.   MOVEMENT-PARASOMNIA:  No significant movement disturbance.  Bathroom x2.   IMPRESSIONS-RECOMMENDATIONS:  1. Minimal obstructive sleep apnea/hypopnea syndrome, apnea-hypopnea      index (AHI) 5.8 per hour with most      events while supine.  Mild snoring with oxygen desaturation to a      nadir of 85%.  2. See MSLT report to follow.      Clinton D. Maple Hudson, MD, [facility], FACP  Diplomate, Biomedical engineer of Sleep Medicine  Electronically Signed     CDY/MEDQ  D:  06/20/2007 10:34:35  T:   06/20/2007 11:32:10  Job:  [id number]

## 2010-08-14 NOTE — Procedures (Signed)
NAME:  Lindsey [name], Lindsey [name]                  ACCOUNT NO.:  [id number]   MEDICAL RECORD NO.:  [id number]          PATIENT TYPE:  OUT   LOCATION:  SLEEP CENTER                 FACILITY:  [facility]   PHYSICIAN:  Clinton D. Maple Hudson, MD, FCCP, FACPDATE OF BIRTH:  [birth year]10/1955   DATE OF STUDY:  02/11/2007                            NOCTURNAL POLYSOMNOGRAM   REFERRING PHYSICIAN:  Pollyann Savoy, M.D.   INDICATION FOR STUDY:  Insomnia with sleep apnea.   EPWORTH SLEEPINESS SCORE:  Epworth sleepiness score 20/24, BMI 27.5,  weight 160 pounds, height 64 inches, neck 12 inches.   MEDICATIONS:  Home medications were listed and reviewed.   SLEEP ARCHITECTURE:  Total sleep time 329 minutes with sleep efficiency  90%, stage 1 3%, stage 2 77%, stage 3 absent, REM 19% of total sleep  time, sleep latency 0, REM latency 34 minutes, awake after sleep onset  35 minutes, arousal index 2.5.  Diazepam was taken at bedtime.   RESPIRATORY DATA:  Apnea-hypopnea index (AHI, RDI) 3.3 obstructive  events per hour which is within normal (normal range 0-5 per hour).  There were 3 obstructive apneas, 5 central apneas, and 10 hypopneas.  All events were recorded while sleeping supine.  REM AHI 10.6.   OXYGEN DATA:  Moderate snoring with oxygen desaturation to a nadir 82%,  mean oxygen saturations through the study was 94.7% on room air.   CARDIAC DATA:  Normal sinus rhythm.   MOVEMENT/PARASOMNIA:  No significant movement disturbance.   IMPRESSIONS/RECOMMENDATIONS:  1. Unremarkable sleep architecture for center environment recognizing      that she took diazepam at bedtime.  2. Occasional sleep disordered breathing events within normal limits.      Apnea-hypopnea index 3.3 per hour      (normal range 0-5 per hour).  All events were recorded when she was      on her back suggesting it would be helpful to encourage sleep on      sides.  Moderate snoring with oxygen desaturation to a nadir of      82%.      Clinton D.  Maple Hudson, MD, [facility], FACP  Diplomate, Biomedical engineer of Sleep Medicine  Electronically Signed     CDY/MEDQ  D:  02/15/2007 14:08:18  T:  02/16/2007 08:59:33  Job:  [id number]

## 2010-08-17 NOTE — Op Note (Signed)
NAME:  TALISA, PETRAK                            ACCOUNT NO.:  [id number]   MEDICAL RECORD NO.:  [id number]                   PATIENT TYPE:  AMB   LOCATION:  [facility]                                 FACILITY:  [facility]   PHYSICIAN:  Jamison Neighbor, M.D.               DATE OF BIRTH:  1955-09-15   DATE OF PROCEDURE:  08/12/2003  DATE OF DISCHARGE:                                 OPERATIVE REPORT   SERVICE:  Urology.   PREOPERATIVE DIAGNOSES:  1. Urethral polypoid mass.  2. Interstitial cystitis.  3. Urinary retention.   POSTOPERATIVE DIAGNOSES:  1. Urethral polypoid mass.  2. Interstitial cystitis.  3. Urinary retention.   PROCEDURE:  Cystoscopy, excision of two urethral polyps, hydrodistention of  the bladder, Marcaine and Pyridium installation, Marcaine and Kenalog  injection, Foley catheter insertion.   SURGEON:  Jamison Neighbor, M.D.   ANESTHESIA:  General.   COMPLICATIONS:  None.   DRAINS:  16 French Foley catheter.   BRIEF HISTORY:  This 55 year old female had a successful cystoscopy and  hydrodistention done in late 2004 at which time a few small urethral  polypoid lesions were removed.  The patient had no return of her symptoms  until recently when she began to have a significant increase in her urgency,  frequency and pain. Pain management did not work, B&O suppositories did not  work, Forensic scientist therapy did not work. The patient was found to have an  elevated post void residual as high as 330 mL.  It is unclear if this is due  to the urethral polypoid lesions or if it due to detrusor sphincter  dyssynergy caused by chronic pelvic pain. The patient is now to undergo a  repeat hydrodistention, she is also to undergo removal of the polypoid  masses with temporary Foley catheter drainage,  going forward may require  pelvic floor rehabilitation if she does not empty her bladder better. The  patient understands the risks and benefits of the procedure and gave full  and  informed consent.   DESCRIPTION OF PROCEDURE:  After successful induction of general anesthesia,  the patient was placed in the dorsal lithotomy position, prepped with  Betadine and draped in the usual sterile fashion. Careful examination showed  that there was no evidence of any significant cystocele, rectocele or  enterocele, there was no evidence of a diverticulum. Inspection in the  urethra showed that there was a large polypoid lesion at approximately the 8  o'clock position as well as a somewhat smaller lesion on the opposite side.  These were grasped and excised with the electrocautery. These lesions were  noted to originate in the distal third of the urethra but were clearly not  standard caruncles and did not sit right out at the opening. Because these  were deep into the urethra, there really was no ability to close the defect  that occurred after these lesions  had been excised and for that reason, a  decision was made to cauterize the base, leave a Foley catheter in place and  allow the mucosa to reepithelialize. The urethra was of normal caliber  accepting a 32 [place] female urethral sound with no evidence of stenosis or  stricture.  The visual inspection of the urethra with the cystoscope showed  no other abnormalities at the bladder neck or in the remainder of the  urethra. The cystoscope was inserted, the bladder was carefully inspected  and was free of any tumor or stones. Both ureteral orifices were normal in  configuration and location. The patient's previous biopsy site was  identified. Hydrodistention of the bladder was performed, the bladder was  distended at a pressure of 100 cm of water for five minutes when the bladder  was drained. The bladder capacity is now approximately 1000 mL. There is no  significant glomerulation and the bladder appears to be much improved over  the last time she underwent hydrodistention. The patient had a mixture of  Marcaine of Pyridium  left within the bladder. The patient had a Foley  catheter in place that will be attached to a leg bag. She will leave this in  for approximately one week to allow that mucosa to heal. The patient will  then be carefully monitored and if she is still having high residual urine,  she will be started on pelvic floor rehabilitation.                                               Jamison Neighbor, M.D.    RJE/MEDQ  D:  08/12/2003  T:  08/12/2003  Job:  [id number]

## 2010-08-17 NOTE — Op Note (Signed)
NAMEMERIBETH, [name]                  ACCOUNT NO.:  [id number]   MEDICAL RECORD NO.:  [id number]          PATIENT TYPE:  AMB   LOCATION:  [facility]                         FACILITY:  [facility]   PHYSICIAN:  Lindsey [name], M.D.  DATE OF BIRTH:  1956-03-13   DATE OF PROCEDURE:  07/24/2004  DATE OF DISCHARGE:                                 OPERATIVE REPORT   SERVICE:  Urology.   PREOPERATIVE DIAGNOSES:  Chronic pelvic pain syndrome/interstitial cystitis.   POSTOPERATIVE DIAGNOSES:  Chronic pelvic pain syndrome/interstitial  cystitis.   PROCEDURE:  Cystoscopy, urethral calibration, hydrodistention of the  bladder, Marcaine and Pyridium installation, Marcaine and Kenalog injection.   SURGEON:  Lindsey [name], M.D.   ANESTHESIA:  General.   COMPLICATIONS:  None.   DRAINS:  None.   BRIEF HISTORY:  This 55 year old female has had problems with chronic pelvic  pain felt to be consistent with interstitial cystitis. In the past, she has  also had some issues with urethral polyps that have caused obstruction and  dysfunctional voiding. The patient has requested a repeat hydrodistention be  performed. She understands the risks and benefits of the procedure and full  and informed consent. Specifically, she does understand that there is no  guarantee that she will have any real change in her voiding that will be  long-term and that if she does not have improvement, we certainly will not  continue to offer hydrodistention as a treatment alternative. The patient  gave full and informed consent.   DESCRIPTION OF PROCEDURE:  After successful induction of general anesthesia,  the patient was placed in the dorsal lithotomy position, prepped with  Betadine and draped in the usual sterile fashion. Careful bimanual  examination revealed no abnormalities of the bladder base, there was no  evidence of a cystocele, there was no rectocele or enterocele. The urethra  was unremarkable with no signs of  polypoid lesions and no evidence of a  diverticulum. The urethra was of normal caliber accepting 79 [place] female  urethral sounds with no evidence of stenosis or stricture. The cystoscope  was inserted, the bladder was carefully inspected and was free of any tumor  or stones. Both ureteral orifices were normal in configuration and location.  Hydrodistention of the bladder was then performed. The bladder was distended  at a pressure of 100 cmH2O for five minutes. When the bladder was drained,  the patient had normal bladder capacity of a little over 1000 mL and very  little in the way of glomerulations. This is really unchanged from her last  system with hydrodistention. The patient would appear not to have classic  ongoing interstitial cystitis but more of a pelvic pain syndrome with  associated pelvic floor dysfunction. We will continue her on her present  protocol but will have to give some consideration to skeletal muscle  relaxation techniques, physical therapy and possibly neuromodulation  technology such as InterStim. The patient was given an injection of Marcaine  and Kenalog within the periurethral space for a nerve block. Marcaine and  Pyridium were left within the bladder.  The  patient received an intraoperative B&O suppository as well as Toradol  and zofran. She will be sent home with a new prescription for Vicodin as  well as B&O suppository. She does have one a day antibiotic prophylaxis so  she will not need additional antibiotic therapy. She will return to the  office in routine followup.      RJE/MEDQ  D:  07/24/2004  T:  07/24/2004  Job:  [id number]

## 2010-08-17 NOTE — Procedures (Signed)
NAME:  Lindsey [name], Lindsey [name]                  ACCOUNT NO.:  [id number]   MEDICAL RECORD NO.:  [id number]          PATIENT TYPE:  OUT   LOCATION:  SLEEP CENTER                 FACILITY:  [facility]   PHYSICIAN:  Clinton D. Maple Hudson, MD, FCCP, FACPDATE OF BIRTH:  June 15, 1956   DATE OF STUDY:  06/08/2007                          MULTIPLE SLEEP LATENCY TEST   REFERRING PHYSICIAN:  Clinton D. Maple Hudson, MD, FCCP, FACP   REFERRING PHYSICIAN:  Dr. Jetty Duhamel.   INDICATIONS FOR STUDY:  Narcolepsy with hypersomnia.  Epworth sleepiness  score 18/24, BMI 28.3.  Height 5 feet 4 inches, weight 165 pounds.  Neck:  14 inches.   HOME MEDICATIONS:  Charted and reviewed significantly include  hydroxyzine, diazepam, fentanyl, hydrocodone, Wellbutrin XL, Lidoderm.  She had been instructed to stop all antidepressant and pain medications  3 days before this study but it was discovered after the study that she  had taken diazepam on the preceding NPSG and she also took diazepam  twice during the multiple sleep latency test which significantly impacts  the interpretation significance we are able to offer.   SLEEP ARCHITECTURE:  Nap #1 start time 8:00 a.m. sleep latency 4 minutes  REM latency NA.  Nap #2 start time 10:00 a.m. sleep latency 3 minutes,  REM latency 7 minutes.  Nap #3 start time, 12 noon sleep latency 1  minute, REM latency NA.  Nap #4 start time 1400 hours.  Sleep latency 2  minutes, REM latency NA.  Nap #5 start time 1600 hours.  Sleep latency  5.5 minutes, REM latency NA.   IMPRESSION/RECOMMENDATIONS:  1. Mean sleep latency for this study was 3.1 minutes, indicating      severe daytime hypersomnia and consistent with her questionnaire      indicating persistent sleepiness and tendency to doze off between      naps despite instructions.  It turned out that she had not revealed      the technician until the end of the study that she had taken      diazepam at both 0730 and at 1240 hours.  2. This study  supports her complaint of daytime hypersomnia and does      not indicate significant nocturnal sleep disturbance sufficient to      explain daytime sleepiness of this degree, although she did have      mild sleep apnea with an AHI of 5.1.  Much more significant was her      sustained use of diazepam through this study as well as her      substantial list of sedating medications and antidepressants which      may suppress REM.  Note that she did not have increased REM either      on her nighttime study or as evidenced by increased sleep onset REM      during her daytime MSLT.  Suggest emphasis on counseling as to the      side effects of these medications with      recommendation that she discuss her prescriptions with appropriate      physician as related to her sleep complaints.  Clinton D. Maple Hudson, MD, [facility], FACP  Diplomate, Biomedical engineer of Sleep Medicine  Electronically Signed     CDY/MEDQ  D:  06/20/2007 10:44:52  T:  06/20/2007 15:08:30  Job:  [id number]

## 2010-08-17 NOTE — Op Note (Signed)
NAMEGEORGEANNA, Lindsey [name]                  ACCOUNT NO.:  [id number]   MEDICAL RECORD NO.:  [id number]          PATIENT TYPE:  AMB   LOCATION:  [facility]                         FACILITY:  [facility]   PHYSICIAN:  Jamison Neighbor, M.D.  DATE OF BIRTH:  07-29-1955   DATE OF PROCEDURE:  05/09/2006  DATE OF DISCHARGE:                               OPERATIVE REPORT   SERVICE:  Urology.   PREOPERATIVE DIAGNOSES:  1. Interstitial cystitis.  2. Urgency incontinence.   POSTOPERATIVE DIAGNOSES:  1. Interstitial cystitis.  2. Urgency incontinence.   PROCEDURE:  Cystoscopy, urethral calibration, hydrodistention of the  bladder,  Marcaine and Pyridium installation, Marcaine and Kenalog  injection, Botox injection.   SURGEON:  Dr. Marcelyn Bruins.   ANESTHESIA:  General.   COMPLICATIONS:  None.   DRAINS:  None.   BRIEF HISTORY:  This is 55 year old female has chronic pelvic pain felt  to be due to interstitial cystitis.  She also has known pelvic floor  dysfunction with significant voiding difficulties.  The patient is  treated with muscle relaxants and physical therapy. In addition she had  been on standard interstitial cystitis therapy.  The patient has had a  worsening of her symptoms and is now to undergo cystoscopy as well as  Botox injection.  She understands the risks and benefits of the  procedure and gave full informed consent.   PROCEDURE:  After successful induction of general anesthesia, the  patient was placed in the dorsal lithotomy position, prepped with  Betadine and draped in the usual sterile fashion.  Careful bimanual  examination showed no cystocele, rectocele or enterocele to speak of.  The urethra was palpably normal with no signs of diverticulum. The  urethra accepted a 30-French female urethral sound with no evidence of  stenosis or stricture.  The cystoscope was inserted, the bladder was  carefully inspected and is free of any tumor or stones.  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 No tumors  or stones could be identified.  The ureters were normal with clear urine  effluxing from each. Hydrodistention of the bladder was performed.  The  bladder was distended at a pressure of 100 cmH2O  for 5 minutes. When  the bladder was drained no granulations could be seen and very little  bleeding was identified.  The bladder capacity was normal at over 1000  mL. This would certainly indicate the bladder is much improved from her  interstitial cystitis type therapy but that she still has ongoing pelvic  floor issues.  The patient received 3 units of  Botox injecting a total of 30 injections.  She tolerated this without  difficulty. She was taken to the recovery room in good condition.  She  will stay on her Duragesic patch. She will continue on Macrodantin one  daily and no other changes in her medications will be done.           ______________________________  Jamison Neighbor, M.D.  Electronically Signed     RJE/MEDQ  D:  05/09/2006  T:  05/09/2006  Job:  [id number]

## 2010-08-17 NOTE — Op Note (Signed)
NAMEANTHONELLA, KLAUSNER                  ACCOUNT NO.:  [id number]   MEDICAL RECORD NO.:  [id number]          PATIENT TYPE:  OIB   LOCATION:  2870                         FACILITY:  [facility]   PHYSICIAN:  Reinaldo Meeker, M.D. DATE OF BIRTH:  1956/01/18   DATE OF PROCEDURE:  05/01/2004  DATE OF DISCHARGE:                                 OPERATIVE REPORT   PREOPERATIVE DIAGNOSIS:  Herniated disk at C5-6 left.   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   PROCEDURE:  C5-6 anterior cervical diskectomy with bone bank fusion,  followed by Zephyr anterior cervical plating with operating microscope.   SURGEON:  Reinaldo Meeker, M.D.   ASSISTANT:  Kathaleen Maser. Pool, M.D.   PROCEDURE IN DETAIL:  After placing in the supine position and five pounds  halter traction, the patient's neck was prepped and draped in the usual  sterile fashion. Local anesthetic and x-ray was taken prior to incision to  identify the appropriate level.  Transverse incision was made in the right  anterior neck, starting at the midline and headed toward the medial aspect  of the sternocleidomastoid muscle.  The platysma was then incised  transversely.  The __________ fascial plane between the strap muscles  medially and the sternocleidomastoid laterally was identified, followed down  to the anterior aspect of the cervical spine.  The longus colli muscles were  identified and split midline, stripped away bilaterally __________.  Self-  retaining retractor was placed for exposure, and  x-ray showed approach at the appropriate level.   Using a #15 blade, the disk was incised.  Using pituitary rongeurs and  curets, approximately 90% of disk material was removed.  High-speed drill  was used to widen the interspace.  Microscope was draped and brought into  the field, used throughout to end of the case.   Using microdissection technique, the remaining disk material down to the  posterior longitudinal ligament was  removed.  The wound was then incised  transversely and the caudate removed with the Kerrison punch.  Inspection  out both neural foramen was then carried out.  He had good cords to the  left, symptomatic signs with the C6 nerve roots were well decompressed.  Herniated disk material was removed from around the nerve on the left to  complete the decompression.   At this point, inspection was carried out in all directions for any evidence  of residual compression; none could be identified.  Large amounts of  irrigation was carried out.  Any bleeders were controlled with  electrocoagulation and Gelfoam.   Measurements were taken; 7 mL of bone bank plug was reconstituted.  After  irrigating once more to confirm hemostasis, the plug was impacted without  difficulty.  Fluoroscopy showed to be in good position.  At appropriate  length, Zephyr anterior cervical plate was then chosen.  Under fluoroscopic  guidance, drill holes were placed; followed by placement of 15 mm screws x4.  Locking mechanism was secured and final fluoroscopy showed the plate, screws  and plug to all be in good position.   At  this time, large amounts of irrigation were carried out and any bleeding  controlled with electrocoagulation.  The wound was then closed using  interrupted Vicryl on the platysma, one inverted 5-0 PDS on the subcutaneous  layer, Steri-Strips on the skin.  A sterile dressing was then applied and  the patient was extubated and taken to the recovery room in stable  condition.      ROK/MEDQ  D:  05/01/2004  T:  05/01/2004  Job:  [id number]

## 2010-08-17 NOTE — Op Note (Signed)
Lindsey [name], Lindsey [name]                            ACCOUNT NO.:  [id number]   MEDICAL RECORD NO.:  [id number]                   PATIENT TYPE:  AMB   LOCATION:  DAY                                  FACILITY:  [facility]   PHYSICIAN:  Jamison Neighbor, M.D.               DATE OF BIRTH:  10/18/1955   DATE OF PROCEDURE:  03/08/2003  DATE OF DISCHARGE:                                 OPERATIVE REPORT   SERVICE:  Urology.   PREOPERATIVE DIAGNOSES:  1. Interstitial cystitis.  2. Urethral polyps.   POSTOPERATIVE DIAGNOSES:  1. Interstitial cystitis.  2. Urethral polyps.   PROCEDURE:  Cystoscopy, urethral calibration, hydrodistention of the  bladder, bladder biopsy, cauterization, Marcaine and Pyridium installation,  Marcaine and Kenalog injection.  The secondary procedure is excision of  three urethral polyps with primary closure.   SURGEON:  Jamison Neighbor, M.D.   ANESTHESIA:  General.   COMPLICATIONS:  None.   DRAINS:  A 16 French Foley to a leg bag.   BRIEF HISTORY:  This 55 year old female presented to my office for a second  opinion concerning interstitial cystitis.  The patient has only had a modest  response to aggressive medical management.  The patient was found on  examination to have a tender bladder but in addition had three distinct  urethral polyps and they were exclusively tender when touched during the  exam. It was thought that at least some of the patient's pelvic pain is due  to this polyp formation and the decision was made to remove these at the  time of the diagnostic and therapeutic hydrodistention.  The patient  understands the risks and benefits of the procedure and gave informed  consent.   DESCRIPTION OF PROCEDURE:  After successful induction of general anesthesia,  the patient was placed in the dorsal lithotomy position, prepped with  Betadine and draped in the usual sterile fashion.  Careful bimanual  examination revealed no palpable masses in the adnexa.   There was no  cystocele or rectocele.  The urethra did exhibit the three polyps that had  been seen in the operating room.  The urethra was otherwise palpably normal.  The urethra was calibrated to 47 [place] with no evidence of stenosis or  stricture.  The cystoscope was inserted, the bladder was carefully inspected  and was free of any tumor or stones.  Both ureteral orifices were normal in  configuration and location.  Hydrodistention of the bladder was then  performed, the bladder capacity turned out to be 1000 mL which is much  better than the previous described hydrodistentions when her records had  been reviewed from outside hospitals.  The patient did not have much in the  way of glomerulations or ulcer formation and the bladder actually looked  much improved. It is expected this more than likely a Elmiron effect.  The  patient had a bladder biopsy  that we sent for mast cell analysis, the biopsy  site was cauterized.  The patient had Marcaine and Pyridium left within the  bladder.  Attention was then directed to the urethra.  The polypoid lesions  were carefully excised.  There did appear to be some glandular material all  of which was removed.  Cauterization was performed to obtain hemostasis.  Primary closure of vaginal mucosa to urethral mucosa was performed to close  over the defect at the three sites.  The patient had 64 French Foley  catheter inserted as it is anticipated she will have some difficulty voiding  and she will be sent home with a leg bag.  The patient will be given a  prescription for Lorcet 10, Pyridium plus and doxycycline and return to see  me in followup for catheter removal.                                               Jamison Neighbor, M.D.    RJE/MEDQ  D:  03/08/2003  T:  03/08/2003  Job:  [id number]

## 2010-08-17 NOTE — Op Note (Signed)
NAMEICESIS, RENN                  ACCOUNT NO.:  [id number]   MEDICAL RECORD NO.:  [id number]          PATIENT TYPE:  AMB   LOCATION:  [facility]                         FACILITY:  [facility]   PHYSICIAN:  Jamison Neighbor, M.D.  DATE OF BIRTH:  1956-01-03   DATE OF PROCEDURE:  DATE OF DISCHARGE:  05/09/2006                               OPERATIVE REPORT   ADDENDUM:  At the end of the procedure, the patient had Marcaine and  Pyridium instilled into the bladder.  Marcaine and Kenalog were injected  periurethrally for a postoperative block.  Patient tolerated the  procedure well and was taken to the recovery room in good condition.           ______________________________  Jamison Neighbor, M.D.  Electronically Signed     RJE/MEDQ  D:  05/13/2006  T:  05/13/2006  Job:  [id number]

## 2010-08-30 ENCOUNTER — Telehealth: Payer: Self-pay | Admitting: *Deleted

## 2010-08-30 MED ORDER — FLUCONAZOLE 150 MG PO TABS: 150.0000 mg | ORAL_TABLET | Freq: Once | ORAL | Status: AC

## 2010-08-30 NOTE — Telephone Encounter (Signed)
Patient advised as instructed via telephone. 

## 2010-08-30 NOTE — Telephone Encounter (Signed)
Pt states that she has a yeast infection and is asking that diflucan be called to [facility] [address].  States she has a lot of problems with yeast infections and cystitis, so she knows what she has.

## 2010-08-30 NOTE — Telephone Encounter (Signed)
rx sent but if symptoms persist, needs to be seen.

## 2011-04-15 ENCOUNTER — Other Ambulatory Visit: Payer: Self-pay | Admitting: Family Medicine

## 2011-04-15 DIAGNOSIS — Z1231 Encounter for screening mammogram for malignant neoplasm of breast: Secondary | ICD-10-CM

## 2011-04-30 ENCOUNTER — Ambulatory Visit
Admission: RE | Admit: 2011-04-30 | Discharge: 2011-04-30 | Disposition: A | Discharge status: Home / Self Care | Payer: Medicare Other | Source: Ambulatory Visit | Attending: Family Medicine | Admitting: Family Medicine

## 2011-04-30 DIAGNOSIS — Z1231 Encounter for screening mammogram for malignant neoplasm of breast: Secondary | ICD-10-CM

## 2011-05-02 ENCOUNTER — Encounter: Payer: Self-pay | Admitting: *Deleted

## 2011-07-01 ENCOUNTER — Other Ambulatory Visit: Payer: Self-pay | Admitting: *Deleted

## 2011-07-01 NOTE — Telephone Encounter (Signed)
Spoke with patient and Lindsey [name] stated that Lindsey [name] does not need these Rx's refilled until her f/u appt with Dr. Milinda Antis.  Lindsey [name] wants to know if Dr. Milinda Antis will order a vitamin d level on her to go along with other labs that Lindsey [name] is having.  Please advise.

## 2011-07-01 NOTE — Telephone Encounter (Signed)
Please make appt for f/u when able and refil 1 mo with one refil for both, thanks

## 2011-07-01 NOTE — Telephone Encounter (Signed)
Received faxed refill request, patient hasn't been seen in some time.

## 2011-07-02 ENCOUNTER — Telehealth: Payer: Self-pay | Admitting: Family Medicine

## 2011-07-02 DIAGNOSIS — E785 Hyperlipidemia, unspecified: Secondary | ICD-10-CM

## 2011-07-02 DIAGNOSIS — E559 Vitamin D deficiency, unspecified: Secondary | ICD-10-CM

## 2011-07-02 DIAGNOSIS — M899 Disorder of bone, unspecified: Secondary | ICD-10-CM

## 2011-07-02 DIAGNOSIS — Z0000 Encounter for general adult medical examination without abnormal findings: Secondary | ICD-10-CM

## 2011-07-02 NOTE — Telephone Encounter (Signed)
Message copied by Judy Pimple on Tue Jul 02, 2011  9:57 AM ------      Message from: Alvina Chou      Created: Thu Jun 27, 2011 10:52 AM      Regarding: lab orders for 4.3.13       Patient is scheduled for CPX labs, please order future labs, Thanks , Camelia Eng

## 2011-07-02 NOTE — Telephone Encounter (Signed)
Will refil meds at visit Will make sure to do vit D with her future labs

## 2011-07-02 NOTE — Telephone Encounter (Signed)
Patient advised as instructed via telephone. 

## 2011-07-03 ENCOUNTER — Other Ambulatory Visit (INDEPENDENT_AMBULATORY_CARE_PROVIDER_SITE_OTHER): Payer: Medicare Other

## 2011-07-03 DIAGNOSIS — Z0000 Encounter for general adult medical examination without abnormal findings: Secondary | ICD-10-CM

## 2011-07-03 DIAGNOSIS — E559 Vitamin D deficiency, unspecified: Secondary | ICD-10-CM

## 2011-07-03 DIAGNOSIS — M949 Disorder of cartilage, unspecified: Secondary | ICD-10-CM

## 2011-07-03 DIAGNOSIS — E785 Hyperlipidemia, unspecified: Secondary | ICD-10-CM

## 2011-07-03 LAB — LIPID PANEL
Cholesterol: 249 mg/dL — ABNORMAL HIGH (ref 0–200)
HDL: 74.6 mg/dL (ref >39.00)
VLDL: 33.8 mg/dL (ref 0.0–40.0)

## 2011-07-03 LAB — COMPREHENSIVE METABOLIC PANEL
BUN: 13 mg/dL (ref 6–23)
CO2: 27 mEq/L (ref 19–32)
Creatinine, Ser: 0.9 mg/dL (ref 0.4–1.2)
GFR: 70.99 mL/min (ref >60.00)
Glucose, Bld: 91 mg/dL (ref 70–99)
Total Bilirubin: 0.2 mg/dL — ABNORMAL LOW (ref 0.3–1.2)

## 2011-07-03 LAB — CBC WITH DIFFERENTIAL/PLATELET
Eosinophils Relative: 1.1 % (ref 0.0–5.0)
HCT: 41.3 % (ref 36.0–46.0)
Hemoglobin: 13.6 g/dL (ref 12.0–15.0)
Lymphs Abs: 3.6 10*3/uL (ref 0.7–4.0)
Monocytes Relative: 7 % (ref 3.0–12.0)
Neutro Abs: 2.4 10*3/uL (ref 1.4–7.7)
WBC: 6.5 10*3/uL (ref 4.5–10.5)

## 2011-07-03 LAB — LDL CHOLESTEROL, DIRECT: Direct LDL: 161.5 mg/dL

## 2011-07-05 ENCOUNTER — Other Ambulatory Visit: Payer: Self-pay | Admitting: *Deleted

## 2011-07-05 MED ORDER — ATENOLOL 100 MG PO TABS: 100.0000 mg | ORAL_TABLET | Freq: Every day | ORAL | Status: DC

## 2011-07-08 ENCOUNTER — Other Ambulatory Visit: Payer: Self-pay

## 2011-07-08 NOTE — Telephone Encounter (Signed)
Pt request refill Atenolol. i advised pt was refilled 07/05/11 to [facility]. Pt will ck with pharmacist.

## 2011-07-10 ENCOUNTER — Encounter: Payer: Medicare Other | Admitting: Family Medicine

## 2011-07-22 ENCOUNTER — Encounter: Payer: Self-pay | Admitting: Family Medicine

## 2011-07-22 ENCOUNTER — Ambulatory Visit (INDEPENDENT_AMBULATORY_CARE_PROVIDER_SITE_OTHER): Payer: Medicare Other | Admitting: Family Medicine

## 2011-07-22 VITALS — BP 134/82 | HR 64 | Temp 97.9°F | Ht 64.0 in | Wt 145.5 lb

## 2011-07-22 DIAGNOSIS — Z0000 Encounter for general adult medical examination without abnormal findings: Secondary | ICD-10-CM

## 2011-07-22 DIAGNOSIS — E785 Hyperlipidemia, unspecified: Secondary | ICD-10-CM

## 2011-07-22 DIAGNOSIS — M899 Disorder of bone, unspecified: Secondary | ICD-10-CM

## 2011-07-22 DIAGNOSIS — M949 Disorder of cartilage, unspecified: Secondary | ICD-10-CM

## 2011-07-22 DIAGNOSIS — I10 Essential (primary) hypertension: Secondary | ICD-10-CM

## 2011-07-22 DIAGNOSIS — N951 Menopausal and female climacteric states: Secondary | ICD-10-CM

## 2011-07-22 MED ORDER — ESTRADIOL 2 MG PO TABS: ORAL_TABLET | ORAL | Status: DC

## 2011-07-22 NOTE — Progress Notes (Signed)
Subjective:    Patient ID: Lindsey [name], female    DOB: 1955/09/20, 56 y.o.   MRN: [id number]  HPI Here for check up of chronic medical conditions and to review health mt list   About the same medically   Chronically battles fatigue and pain   bp is  134/82   Today No cp or palpitations or headaches or edema  No side effects to medicines    Wt id up 3 lb with bmi of 24- good  Thinks she eats a healthy diet  Cannot do much exercise - but trying to move more outdoors especially   LDL high still Lab Results  Component Value Date   CHOL 249* 07/03/2011   CHOL 236* 07/25/2010   CHOL 179 01/17/2010   Lab Results  Component Value Date   HDL 74.60 07/03/2011   HDL 64.00 07/25/2010   HDL 40.98 01/17/2010   Lab Results  Component Value Date   LDLCALC 93 01/17/2010   LDLCALC 153* 05/06/2008   Lab Results  Component Value Date   TRIG 169.0* 07/03/2011   TRIG 156.0* 07/25/2010   TRIG 159.0* 01/17/2010   Lab Results  Component Value Date   CHOLHDL 3 07/03/2011   CHOLHDL 4 07/25/2010   CHOLHDL 3 01/17/2010   Lab Results  Component Value Date   LDLDIRECT 161.5 07/03/2011   LDLDIRECT 149.3 07/25/2010   LDLDIRECT 174.5 11/30/2009   could not tolerate simvastatin in past Has not had any sat fats -- but needs to cut her sugar out  Is very afraid of chol med and not willing to try   Gyn exam - had partial hyst with IC  No cervix at all  No new symptoms or hx of abn   mammo 1/13- normal  Self exam-no new lumps or changes   Flu shot -- got that in the fall    colonosc 1/12 normal - 10 year recal1  Osteopenia  2/11 dexa Vit D is 89- borderline high  Takes 5000 iu daily   Patient Active Problem List  Diagnoses  . DEFICIENCY, VITAMIN D NOS  . HYPERLIPIDEMIA  . DEPRESSION  . OBSTRUCTIVE SLEEP APNEA  . ACUTE DYSTONIA DUE TO DRUGS  . MIGRAINE HEADACHE  . HYPERTENSION  . HEMORRHOIDS-INTERNAL  . GERD  . CYSTITIS, CHRONIC INTERSTITIAL  . MENOPAUSE-RELATED VASOMOTOR SYMPTOMS  .  OSTEOARTHROSIS, GENERALIZED, MULTIPLE SITES  . FIBROMYALGIA  . OSTEOPENIA  . PATENT FORAMEN OVALE  . SOMNOLENCE  . BREAST MASS, HX OF  . Routine general medical examination at a health care facility   Past Medical History  Diagnosis Date  . Interstitial cystitis   . Pelvic floor dysfunction   . Arthritis   . Vitamin d deficiency   . Chronic fatigue   . Tendonitis     hand  . Migraines   . PFO (patent foramen ovale)   . Fibromyalgia   . Hyperlipidemia   . Hypertension   . Hypersomnia   . IBS (irritable bowel syndrome)   . Hemorrhoids   . Osteopenia    Past Surgical History  Procedure Date  . Fracture surgery     ankle fracture x 2  . Breast surgery     many  . Vaginal hysterectomy 1990    because of the IC partial  . Nasal sinus surgery     multiple  . Bladder surgery     and hydrodistension  . Cervical discectomy 2006    fusion and plating   History  Substance Use Topics  . Smoking status: Never Smoker   . Smokeless tobacco: Not on file  . Alcohol Use: No   Family History  Problem Relation Age of Onset  . Fibromyalgia Mother   . Diabetes Father   . Alcohol abuse Father   . Cancer Paternal Uncle     colon  . Cancer Maternal Grandmother     stomach  . Diabetes Paternal Grandmother   . Diabetes Paternal Grandfather    Allergies  Allergen Reactions  . Buspirone Hcl     REACTION: dystonia  . Simvastatin     REACTION: malaise   Current Outpatient Prescriptions on File Prior to Visit  Medication Sig Dispense Refill  . atenolol (TENORMIN) 100 MG tablet Take 1 tablet (100 mg total) by mouth daily.  90 tablet  3  . Azelaic Acid (FINACEA) 15 % cream Apply to face twice a day       . baclofen (LIORESAL) 10 MG tablet Take as directed as needed       . Botulinum Toxin Type A (BOTOX) 200 UNITS SOLR Injected as directed for migraine management       . buPROPion (WELLBUTRIN XL) 150 MG 24 hr tablet Take 150 mg by mouth 2 (two) times daily.        . diazepam  (VALIUM) 5 MG tablet 1/2 in the AM and 1 in the PM       . estradiol (ESTRACE) 2 MG tablet Take one and one-half tablets once daily  135 tablet  3  . famotidine (PEPCID) 40 MG tablet Take 40 mg by mouth 2 (two) times daily.        . fentaNYL (DURAGESIC - DOSED MCG/HR) 50 MCG/HR Place 1 patch onto the skin every other day.        Marland Kitchen HYDROcodone-acetaminophen (LORCET) 10-650 MG per tablet Take 1 tablet by mouth every 6 (six) hours as needed.        . hydrOXYzine (ATARAX) 50 MG tablet Take 50 mg by mouth 2 (two) times daily.        Marland Kitchen lidocaine (LIDODERM) 5 % Place 1 patch onto the skin daily. Remove & Discard patch within 12 hours or as directed by MD       . lubiprostone (AMITIZA) 8 MCG capsule Take 8 mcg by mouth 2 (two) times daily with a meal.        . pentosan polysulfate (ELMIRON) 100 MG capsule Take two by mouth two times daily       . polyethylene glycol powder (MIRALAX) powder Take 17 g by mouth daily.          Review of Systems Review of Systems  Constitutional: Negative for fever, appetite change,  and unexpected weight change. pos for chronic fatigue Eyes: Negative for pain and visual disturbance.  Respiratory: Negative for cough and shortness of breath.   Cardiovascular: Negative for cp or palpitations    Gastrointestinal: Negative for nausea, diarrhea and constipation.  Genitourinary: pos for chronic urgency/ frequency/ dysuria and bladder pain  Skin: Negative for pallor or rash   MSK pos for chronic joint and muscle pain  Neurological: Negative for weakness, light-headedness, numbness and headaches.  Hematological: Negative for adenopathy. Does not bruise/bleed easily.  Psychiatric/Behavioral: pos  for dysphoric mood. The patient is not nervous/anxious.  no SI        Objective:   Physical Exam  Constitutional: She appears well-developed and well-nourished. No distress.       Fatigued appearing  as is her baseline   HENT:  Head: Normocephalic and atraumatic.  Right Ear:  External ear normal.  Left Ear: External ear normal.  Nose: Nose normal.  Mouth/Throat: Oropharynx is clear and moist.       Nares are boggy  Eyes: Conjunctivae and EOM are normal. Pupils are equal, round, and reactive to light. No scleral icterus.  Neck: Normal range of motion. Neck supple. No JVD present. Carotid bruit is not present. No thyromegaly present.  Cardiovascular: Normal rate, regular rhythm, normal heart sounds and intact distal pulses.  Exam reveals no gallop.   Pulmonary/Chest: Effort normal and breath sounds normal. No respiratory distress. She has no wheezes. She exhibits no tenderness.  Abdominal: Soft. Bowel sounds are normal. She exhibits no distension, no abdominal bruit and no mass. There is no tenderness.  Genitourinary: No breast swelling, tenderness, discharge or bleeding.       Breast exam: No mass, nodules, thickening, tenderness, bulging, retraction, inflamation, nipple discharge or skin changes noted.  No axillary or clavicular LA.  Chaperoned exam.    Musculoskeletal: Normal range of motion. She exhibits tenderness. She exhibits no edema.       Diffuse joint and myofasical tenderness No joint swelling   Lymphadenopathy:    She has no cervical adenopathy.  Neurological: She is alert. She has normal reflexes. No cranial nerve deficit. She exhibits normal muscle tone. Coordination normal.  Skin: Skin is warm and dry. No rash noted. No erythema. No pallor.       Solar lentigos diffusely   Psychiatric: Judgment and thought content normal. Her affect is not blunt and not labile. Her speech is delayed. She is slowed. She is not withdrawn. Cognition and memory are normal. She exhibits a depressed mood.       Psychomotor slowing noted  Not tearful She is attentive.          Assessment & Plan:

## 2011-07-22 NOTE — Assessment & Plan Note (Signed)
bp in fair control at this time  No changes needed  Disc lifstyle change with low sodium diet and exercise   No med changes  Rev labs

## 2011-07-22 NOTE — Assessment & Plan Note (Signed)
Rev pros/ cons/ risks of HRT with pt in detail -she is aware of risks of blood clots/ breast cancer Pt states her vasomotor symptoms and pain are so severe off hrt she cannot go without it and is willing to except risks Also off of it her IC is worse Estrace was refilled Partial hyst in past

## 2011-07-22 NOTE — Assessment & Plan Note (Signed)
Chol is high - disc diet  Pt cannot tol statins and is afraid to try anything else with her fibromyalgia / chronic pain  Disc goals for lipids and reasons to control them Rev labs with pt Rev low sat fat diet in detail  Will continue to follow

## 2011-07-22 NOTE — Patient Instructions (Signed)
Avoid red meat/ fried foods/ egg yolks/ fatty breakfast meats/ butter, cheese and high fat dairy/ and shellfish  --keep working on it , cholesterol is up  Blood pressure is good  Try to do low impact exercise as much as you can  We will schedule bone density test at check out  Cut your vitamin D from 5000 iu to 2500 iu daily --will continue to watch this

## 2011-07-22 NOTE — Assessment & Plan Note (Signed)
Will schedule dexa - due for 2 y check Enc wt bearing exercise as tolerated  Vit D high- will cut dose to 2500 iu daily  Update with results

## 2011-07-25 NOTE — Assessment & Plan Note (Signed)
Reviewed health habits including diet and exercise and skin cancer prevention Also reviewed health mt list, fam hx and immunizations  Difficult to care for herself or get much exercise in light of chronic pain and fatigue She is trying to be more active and is motivated Labs reviewed

## 2011-07-29 ENCOUNTER — Ambulatory Visit
Admission: RE | Admit: 2011-07-29 | Discharge: 2011-07-29 | Disposition: A | Discharge status: Home / Self Care | Payer: Medicare Other | Source: Ambulatory Visit | Attending: Family Medicine | Admitting: Family Medicine

## 2011-07-29 DIAGNOSIS — M899 Disorder of bone, unspecified: Secondary | ICD-10-CM

## 2011-07-29 DIAGNOSIS — M949 Disorder of cartilage, unspecified: Secondary | ICD-10-CM

## 2011-07-30 ENCOUNTER — Encounter: Payer: Self-pay | Admitting: Family Medicine

## 2011-10-21 ENCOUNTER — Encounter: Payer: Self-pay | Admitting: Internal Medicine

## 2011-10-21 ENCOUNTER — Ambulatory Visit (INDEPENDENT_AMBULATORY_CARE_PROVIDER_SITE_OTHER): Payer: Medicare Other | Admitting: Internal Medicine

## 2011-10-21 VITALS — BP 140/90 | HR 64 | Ht 64.0 in | Wt 146.1 lb

## 2011-10-21 DIAGNOSIS — K121 Other forms of stomatitis: Secondary | ICD-10-CM

## 2011-10-21 DIAGNOSIS — K589 Irritable bowel syndrome without diarrhea: Secondary | ICD-10-CM | POA: Insufficient documentation

## 2011-10-21 DIAGNOSIS — K1370 Unspecified lesions of oral mucosa: Secondary | ICD-10-CM

## 2011-10-21 MED ORDER — DICYCLOMINE HCL 20 MG PO TABS: ORAL_TABLET | ORAL | Status: DC

## 2011-10-21 MED ORDER — LINACLOTIDE 290 MCG PO CAPS: 1.0000 | ORAL_CAPSULE | Freq: Every day | ORAL | Status: DC

## 2011-10-21 NOTE — Patient Instructions (Addendum)
We have sent the following medications to your pharmacy for you to pick up at your convenience: Dicyclomine   Hold your Amitiza and try the Linzess one capsule every day.  We are giving you samples of this today.  Call us back in several weeks and let us know how your doing.  Contact Dr. Milinda Antis regarding treatment for the mouth ulcers.   Thank you for choosing me and [facility],.

## 2011-10-21 NOTE — Progress Notes (Signed)
  Subjective:    Patient ID: Lindsey [name], female    DOB: Nov 06, 1955, 56 y.o.   MRN: [id number]  HPI This middle-aged white woman is known to me due to problems with IBS which is mainly constipation predominant. She comes in today with platelets of persistent constipation despite daily MiraLax and Amitiza 8ug bid. She remains on chronic narcotics for chronic abdominal and pelvic pain related to interstitial cystitis. He also reports intermittent severe upper abdominal cramping that radiates to the lower abdomen, which is postprandial. She sleeps a lot with chronic fatigue, and she generally eats out and reports that when she eats out she will experience the severe cramps. She'll have to wait and about an hour later she will defecate with some relief. This is not a new or symptom complex. She does recall using dicyclomine in the past with some success but thought that it might slow things down and constipated her worse so she stopped it. She moves her bowels every few day usually, sometimes they are loose and there are multiple stools.  She also has problems with recurrent mouth ulcers. She currently has only one at this time but they're small sore and painful. She wonders if they might be related to her gastrointestinal problems.  Medications, allergies, past medical history, past surgical history, family history and social history are reviewed and updated in the EMR.  Review of Systems As above. She also continues to have interstitial cystitis difficulty managed by Dr. Logan Bores.    Objective:   Physical Exam General:  NAD Mouth: On the left lower lip, there is one small aphthous ulcer, otherwise no lesions Lungs:  clear Heart:  S1S2 no rubs, murmurs or gallops Abdomen:  soft and nontender, BS+  Data reviewed:  07/03/2011 labs, normal CBC normal comprehensive metabolic panel. Her TSH was 5.08.     Assessment & Plan:   1. IBS (irritable bowel syndrome)   This is complicated by chronic  narcotic use for her abdominal and pelvic pain. 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  Will try resuming dicyclomine 20 mg before meals to see if that helps with her postprandial abdominal cramping. Will also try linaclotide 290 mcg daily instead of the Amitiza.   She will call in about 2 weeks with results of these therapeutic trials.   2. Mouth ulcers   I have asked her to ask her primary care physician whether she should see her or a  dermatologist her ENT physician regarding these problems. She may just need some local care treatment on as-needed basis. I do not think this is related to any of her gastrointestinal problems though certainly stress can cause a flare of mouth ulcers.    CC: Roxy Manns, MD and Marcelyn Bruins, MD

## 2011-11-06 ENCOUNTER — Other Ambulatory Visit: Payer: Self-pay | Admitting: Internal Medicine

## 2011-11-06 MED ORDER — LINACLOTIDE 290 MCG PO CAPS: 1.0000 | ORAL_CAPSULE | Freq: Every day | ORAL | Status: DC

## 2011-11-06 NOTE — Telephone Encounter (Signed)
Samples of Linzess 290 mcg worked so Rx sent to her mail order pharmacy per her request.

## 2012-04-08 ENCOUNTER — Ambulatory Visit (INDEPENDENT_AMBULATORY_CARE_PROVIDER_SITE_OTHER): Payer: Medicare Other | Admitting: Family Medicine

## 2012-04-08 ENCOUNTER — Encounter: Payer: Self-pay | Admitting: Family Medicine

## 2012-04-08 ENCOUNTER — Other Ambulatory Visit: Payer: Self-pay | Admitting: Family Medicine

## 2012-04-08 VITALS — BP 112/62 | HR 62 | Temp 98.5°F | Ht 64.0 in | Wt 154.0 lb

## 2012-04-08 DIAGNOSIS — B009 Herpesviral infection, unspecified: Secondary | ICD-10-CM | POA: Insufficient documentation

## 2012-04-08 DIAGNOSIS — Z1231 Encounter for screening mammogram for malignant neoplasm of breast: Secondary | ICD-10-CM

## 2012-04-08 MED ORDER — VALACYCLOVIR HCL 1 G PO TABS: 2000.0000 mg | ORAL_TABLET | Freq: Two times a day (BID) | ORAL | Status: DC

## 2012-04-08 NOTE — Progress Notes (Signed)
Subjective:    Patient ID: Lindsey [name], female    DOB: 10-Dec-1955, 57 y.o.   MRN: [id number]  HPI Pt is here for recurrent sores on the top of her head   She has seen derm for this - but did not have a breakout at the time - and could not do anything   Has talked to her rheumatologist and neurologist   Has a spot right now  It is extremely painful at times and itchy and burns to touch  She will feel it before it pops out  Bump then blister and red and will ooze then will crust over   No dandruff in the past  Tried T gel - and no improvement   Patient Active Problem List  Diagnosis  . DEFICIENCY, VITAMIN D NOS  . HYPERLIPIDEMIA  . DEPRESSION  . OBSTRUCTIVE SLEEP APNEA  . MIGRAINE HEADACHE  . HYPERTENSION  . GERD  . CYSTITIS, CHRONIC INTERSTITIAL  . MENOPAUSE-RELATED VASOMOTOR SYMPTOMS  . OSTEOARTHROSIS, GENERALIZED, MULTIPLE SITES  . FIBROMYALGIA  . OSTEOPENIA  . PATENT FORAMEN OVALE  . SOMNOLENCE  . Routine general medical examination at a health care facility  . IBS (irritable bowel syndrome)   Past Medical History  Diagnosis Date  . Interstitial cystitis   . Pelvic floor dysfunction   . Arthritis   . Vitamin D deficiency   . Chronic fatigue   . Tendonitis     hand  . Migraines   . PFO (patent foramen ovale)   . Fibromyalgia   . Hyperlipidemia   . Hypertension   . Hypersomnia   . IBS (irritable bowel syndrome)   . Hemorrhoids   . Osteopenia    Past Surgical History  Procedure Date  . Fracture surgery     ankle fracture x 2  . Breast lumpectomy     many, bilateral  . Vaginal hysterectomy 1990    because of the IC partial  . Nasal sinus surgery     multiple, x4  . Bladder surgery     and hydrodistension  . Cervical discectomy 2006    fusion and plating  . Colonoscopy   . Upper gastrointestinal endoscopy    History  Substance Use Topics  . Smoking status: Never Smoker   . Smokeless tobacco: Never Used  . Alcohol Use: No   Family History    Problem Relation Age of Onset  . Fibromyalgia Mother   . Diabetes Father   . Alcohol abuse Father   . Colon cancer Paternal Uncle   . Stomach cancer Paternal Grandfather   . Diabetes Paternal Grandmother   . Diabetes Paternal Grandfather   . Heart disease Father   . Stroke Paternal Grandmother    Allergies  Allergen Reactions  . Buspirone Hcl     REACTION: dystonia  . Simvastatin     REACTION: malaise   Current Outpatient Prescriptions on File Prior to Visit  Medication Sig Dispense Refill  . atenolol (TENORMIN) 100 MG tablet Take 1 tablet (100 mg total) by mouth daily.  90 tablet  3  . Azelaic Acid (FINACEA) 15 % cream Apply to face twice a day       . baclofen (LIORESAL) 10 MG tablet Take as directed as needed       . Botulinum Toxin Type A (BOTOX) 200 UNITS SOLR Injected as directed for migraine management       . buPROPion (WELLBUTRIN XL) 150 MG 24 hr tablet Take 150 mg by mouth  2 (two) times daily.        . cholecalciferol (VITAMIN D) 1000 UNITS tablet Take 2,500 Units by mouth daily.      . diazepam (VALIUM) 5 MG tablet 1/2 in the AM and 1 in the PM       . dicyclomine (BENTYL) 20 MG tablet Take one tab before meals  90 tablet  5  . estradiol (ESTRACE) 2 MG tablet Take one and one-half tablets once daily  135 tablet  3  . famotidine (PEPCID) 40 MG tablet Take 40 mg by mouth 2 (two) times daily.        . fentaNYL (DURAGESIC - DOSED MCG/HR) 50 MCG/HR Place 1 patch onto the skin every other day.        Marland Kitchen HYDROcodone-acetaminophen (LORCET) 10-650 MG per tablet Take 1 tablet by mouth every 6 (six) hours as needed.        . hydrOXYzine (ATARAX) 50 MG tablet Take 50 mg by mouth 2 (two) times daily.        Marland Kitchen lidocaine (LIDODERM) 5 % Place 1 patch onto the skin daily. Remove & Discard patch within 12 hours or as directed by MD       . Linaclotide (LINZESS) 290 MCG CAPS Take 1 capsule by mouth daily.  90 capsule  3  . lubiprostone (AMITIZA) 8 MCG capsule Take 8 mcg by mouth 2 (two)  times daily with a meal.        . pentosan polysulfate (ELMIRON) 100 MG capsule Take two by mouth two times daily       . polyethylene glycol powder (MIRALAX) powder Take 17 g by mouth daily.           Review of Systems    Review of Systems  Constitutional: Negative for fever, appetite change,  and unexpected weight change.  Eyes: Negative for pain and visual disturbance.  Respiratory: Negative for cough and shortness of breath.   Cardiovascular: Negative for cp or palpitations    Gastrointestinal: Negative for nausea, diarrhea and constipation.  Genitourinary: pos for chronic  for urgency and frequency.  Skin: Negative for pallor or rash  pos for lesions in scalp  MSK pos for chronic pain  Neurological: Negative for weakness, light-headedness, numbness and headaches.  Hematological: Negative for adenopathy. Does not bruise/bleed easily.  Psychiatric/Behavioral: Negative for dysphoric mood. The patient is not nervous/anxious.      Objective:   Physical Exam  Constitutional: She appears well-developed and well-nourished. No distress.  HENT:  Head: Normocephalic and atraumatic.  Mouth/Throat: Oropharynx is clear and moist.  Eyes: Conjunctivae normal and EOM are normal. Pupils are equal, round, and reactive to light. Right eye exhibits no discharge. Left eye exhibits no discharge.  Neck: Normal range of motion. Neck supple.  Cardiovascular: Normal rate and regular rhythm.   Lymphadenopathy:    She has no cervical adenopathy.  Skin: Skin is warm and dry. There is erythema. No pallor.       Several papules anterior scalp without blisters or ulceration- scantly red  L lat scalp- some similar lesions that are dried and scaley No dandruff  Psychiatric: She has a normal mood and affect.          Assessment & Plan:

## 2012-04-08 NOTE — Assessment & Plan Note (Signed)
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

## 2012-04-08 NOTE — Patient Instructions (Addendum)
When you get a scalp outbreak (I think this is a form of HSV infection based on symptoms)- take 2 pills in am and 2 pills in pm for one day (one day of treatment) This will not cure but will help  Take it at the first sign of a breakout  Let me know if not helpful  Do not touch rash and keep hands clear

## 2012-04-20 ENCOUNTER — Encounter: Payer: Self-pay | Admitting: Family Medicine

## 2012-05-01 ENCOUNTER — Ambulatory Visit
Admission: RE | Admit: 2012-05-01 | Discharge: 2012-05-01 | Disposition: A | Discharge status: Home / Self Care | Payer: Medicare Other | Source: Ambulatory Visit | Attending: Family Medicine | Admitting: Family Medicine

## 2012-05-01 DIAGNOSIS — Z1231 Encounter for screening mammogram for malignant neoplasm of breast: Secondary | ICD-10-CM

## 2012-05-05 ENCOUNTER — Other Ambulatory Visit: Payer: Self-pay | Admitting: Family Medicine

## 2012-05-05 DIAGNOSIS — R928 Other abnormal and inconclusive findings on diagnostic imaging of breast: Secondary | ICD-10-CM

## 2012-05-16 ENCOUNTER — Other Ambulatory Visit: Payer: Self-pay

## 2012-05-20 ENCOUNTER — Ambulatory Visit
Admission: RE | Admit: 2012-05-20 | Discharge: 2012-05-20 | Disposition: A | Discharge status: Home / Self Care | Payer: Medicare Other | Source: Ambulatory Visit | Attending: Family Medicine | Admitting: Family Medicine

## 2012-05-20 ENCOUNTER — Other Ambulatory Visit: Payer: Self-pay | Admitting: Family Medicine

## 2012-05-20 DIAGNOSIS — R928 Other abnormal and inconclusive findings on diagnostic imaging of breast: Secondary | ICD-10-CM

## 2012-05-21 ENCOUNTER — Ambulatory Visit
Admission: RE | Admit: 2012-05-21 | Discharge: 2012-05-21 | Disposition: A | Discharge status: Home / Self Care | Payer: Medicare Other | Source: Ambulatory Visit | Attending: Family Medicine | Admitting: Family Medicine

## 2012-05-21 DIAGNOSIS — R928 Other abnormal and inconclusive findings on diagnostic imaging of breast: Secondary | ICD-10-CM

## 2012-07-13 ENCOUNTER — Telehealth: Payer: Self-pay | Admitting: *Deleted

## 2012-07-13 MED ORDER — ATENOLOL 100 MG PO TABS: 100.0000 mg | ORAL_TABLET | Freq: Every day | ORAL | Status: DC

## 2012-07-13 NOTE — Telephone Encounter (Signed)
Yes it was refilled by Marcelle Smiling, nothing further needs to be done

## 2012-07-13 NOTE — Telephone Encounter (Signed)
I think this was just refilled

## 2012-07-24 ENCOUNTER — Ambulatory Visit (INDEPENDENT_AMBULATORY_CARE_PROVIDER_SITE_OTHER): Payer: Medicare Other | Admitting: Family Medicine

## 2012-07-24 ENCOUNTER — Telehealth: Payer: Self-pay | Admitting: Family Medicine

## 2012-07-24 ENCOUNTER — Encounter: Payer: Self-pay | Admitting: Family Medicine

## 2012-07-24 VITALS — BP 130/82 | HR 72 | Temp 98.2°F | Ht 64.0 in | Wt 152.2 lb

## 2012-07-24 DIAGNOSIS — M949 Disorder of cartilage, unspecified: Secondary | ICD-10-CM

## 2012-07-24 DIAGNOSIS — E559 Vitamin D deficiency, unspecified: Secondary | ICD-10-CM

## 2012-07-24 DIAGNOSIS — R8290 Unspecified abnormal findings in urine: Secondary | ICD-10-CM

## 2012-07-24 DIAGNOSIS — Z0000 Encounter for general adult medical examination without abnormal findings: Secondary | ICD-10-CM

## 2012-07-24 DIAGNOSIS — R82998 Other abnormal findings in urine: Secondary | ICD-10-CM

## 2012-07-24 DIAGNOSIS — E785 Hyperlipidemia, unspecified: Secondary | ICD-10-CM

## 2012-07-24 DIAGNOSIS — N390 Urinary tract infection, site not specified: Secondary | ICD-10-CM | POA: Insufficient documentation

## 2012-07-24 LAB — CBC WITH DIFFERENTIAL/PLATELET
Basophils Relative: 1.1 % (ref 0.0–3.0)
Eosinophils Relative: 1.5 % (ref 0.0–5.0)
Hemoglobin: 12.7 g/dL (ref 12.0–15.0)
Lymphocytes Relative: 34.6 % (ref 12.0–46.0)
Monocytes Relative: 6.7 % (ref 3.0–12.0)
Neutro Abs: 4.2 10*3/uL (ref 1.4–7.7)
RBC: 4.16 Mil/uL (ref 3.87–5.11)

## 2012-07-24 LAB — COMPREHENSIVE METABOLIC PANEL
ALT: 10 U/L (ref 0–35)
BUN: 13 mg/dL (ref 6–23)
CO2: 29 mEq/L (ref 19–32)
Calcium: 9 mg/dL (ref 8.4–10.5)
Chloride: 100 mEq/L (ref 96–112)
Creatinine, Ser: 0.8 mg/dL (ref 0.4–1.2)
GFR: 75.65 mL/min (ref >60.00)
Glucose, Bld: 91 mg/dL (ref 70–99)

## 2012-07-24 LAB — POCT UA - MICROSCOPIC ONLY

## 2012-07-24 LAB — LIPID PANEL
Cholesterol: 242 mg/dL — ABNORMAL HIGH (ref 0–200)
Triglycerides: 104 mg/dL (ref 0.0–149.0)

## 2012-07-24 LAB — POCT URINALYSIS DIPSTICK
Bilirubin, UA: NEGATIVE
Glucose, UA: NEGATIVE
Ketones, UA: NEGATIVE
Nitrite, UA: POSITIVE

## 2012-07-24 LAB — LDL CHOLESTEROL, DIRECT: Direct LDL: 166.1 mg/dL

## 2012-07-24 MED ORDER — CIPROFLOXACIN HCL 250 MG PO TABS: 250.0000 mg | ORAL_TABLET | Freq: Two times a day (BID) | ORAL | Status: DC

## 2012-07-24 NOTE — Telephone Encounter (Signed)
Patient Information:  Caller Name: Lindsey [name]  Phone: [phone]  Patient: Lindsey, [name]  Gender: Female  DOB: [birth year]  Age: 57 Years  PCP: Lindsey [name] [facility])  Pregnant: No  Office Follow Up:  Does the office need to follow up with this patient?: No  Instructions For The Office: N/A  RN Note:  pt also had questions regarding lab work being done today.  RN scheduled an appt with provider so that pt could be treated for UTI symptoms and also get answers regarding lab tests (pt was agreeable)  Symptoms  Reason For Call & Symptoms: pt reports that she thinks she has a bladder infection.  Pt reports she is normally treated by her urologist Dr Marcelyn Bruins.  Pt reports he is only in [place] on Wednesday.  Pt has called the urology office and they suggested pt give urine specimen at her PCP and then they would treat patient accordingly.   Pt reports urine is cloudy and she has pain  Reviewed Health History In EMR: Yes  Reviewed Medications In EMR: Yes  Reviewed Allergies In EMR: Yes  Reviewed Surgeries / Procedures: Yes  Date of Onset of Symptoms: 07/19/2012 OB / GYN:  LMP: Unknown  Guideline(s) Used:  Urination Pain - Female  Disposition Per Guideline:   See Today in Office  Reason For Disposition Reached:   Age > 50 years  Advice Given:  N/A  Patient Will Follow Care Advice:  YES  Appointment Scheduled:  07/24/2012 12:00:00 Appointment Scheduled Provider:  Roxy [name] [facility])

## 2012-07-24 NOTE — Telephone Encounter (Signed)
Pt was seen today in office. 

## 2012-07-24 NOTE — Progress Notes (Signed)
Subjective:    Patient ID: Lindsey [name], female    DOB: 1955-05-11, 57 y.o.   MRN: [id number]  HPI Here with uti symptoms  Has IC - started bladder installations - just started (does them herself ) - has to compound med and cath  Is using marcane and heparin    Symptoms worse -thinks she has an infection  ua is positive today  Severe burning to urinate Frequency Bladder pain is constant and she cannot sleep well at all  Having bladder spasms also   No fever or vomiting but feels bad Urine smells very bad  Patient Active Problem List  Diagnosis  . DEFICIENCY, VITAMIN D NOS  . HYPERLIPIDEMIA  . DEPRESSION  . OBSTRUCTIVE SLEEP APNEA  . MIGRAINE HEADACHE  . HYPERTENSION  . GERD  . CYSTITIS, CHRONIC INTERSTITIAL  . MENOPAUSE-RELATED VASOMOTOR SYMPTOMS  . OSTEOARTHROSIS, GENERALIZED, MULTIPLE SITES  . FIBROMYALGIA  . OSTEOPENIA  . PATENT FORAMEN OVALE  . SOMNOLENCE  . Routine general medical examination at a health care facility  . IBS (irritable bowel syndrome)  . HSV (herpes simplex virus) infection   Past Medical History  Diagnosis Date  . Interstitial cystitis   . Pelvic floor dysfunction   . Arthritis   . Vitamin D deficiency   . Chronic fatigue   . Tendonitis     hand  . Migraines   . PFO (patent foramen ovale)   . Fibromyalgia   . Hyperlipidemia   . Hypertension   . Hypersomnia   . IBS (irritable bowel syndrome)   . Hemorrhoids   . Osteopenia    Past Surgical History  Procedure Laterality Date  . Fracture surgery      ankle fracture x 2  . Breast lumpectomy      many, bilateral  . Vaginal hysterectomy  1990    because of the IC partial  . Nasal sinus surgery      multiple, x4  . Bladder surgery      and hydrodistension  . Cervical discectomy  2006    fusion and plating  . Colonoscopy    . Upper gastrointestinal endoscopy     History  Substance Use Topics  . Smoking status: Never Smoker   . Smokeless tobacco: Never Used  . Alcohol Use:  No   Family History  Problem Relation Age of Onset  . Fibromyalgia Mother   . Diabetes Father   . Alcohol abuse Father   . Colon cancer Paternal Uncle   . Stomach cancer Paternal Grandfather   . Diabetes Paternal Grandmother   . Diabetes Paternal Grandfather   . Heart disease Father   . Stroke Paternal Grandmother    Allergies  Allergen Reactions  . Buspirone Hcl     REACTION: dystonia  . Simvastatin     REACTION: malaise   Current Outpatient Prescriptions on File Prior to Visit  Medication Sig Dispense Refill  . atenolol (TENORMIN) 100 MG tablet Take 1 tablet (100 mg total) by mouth daily.  90 tablet  1  . baclofen (LIORESAL) 10 MG tablet Take as directed as needed       . Botulinum Toxin Type A (BOTOX) 200 UNITS SOLR Injected as directed for migraine management       . buPROPion (WELLBUTRIN XL) 150 MG 24 hr tablet Take 150 mg by mouth 2 (two) times daily.        . cholecalciferol (VITAMIN D) 1000 UNITS tablet Take 2,500 Units by mouth daily.      Marland Kitchen  diazepam (VALIUM) 5 MG tablet 1/2 in the AM and 1 in the PM       . dicyclomine (BENTYL) 20 MG tablet Take one tab before meals  90 tablet  5  . estradiol (ESTRACE) 2 MG tablet Take one and one-half tablets once daily  135 tablet  3  . famotidine (PEPCID) 40 MG tablet Take 40 mg by mouth 2 (two) times daily.        . fentaNYL (DURAGESIC - DOSED MCG/HR) 50 MCG/HR Place 1 patch onto the skin every other day.        Marland Kitchen HYDROcodone-acetaminophen (LORCET) 10-650 MG per tablet Take 1 tablet by mouth every 6 (six) hours as needed.        . hydrOXYzine (ATARAX) 50 MG tablet Take 50 mg by mouth 2 (two) times daily.        Marland Kitchen lidocaine (LIDODERM) 5 % Place 1 patch onto the skin daily. Remove & Discard patch within 12 hours or as directed by MD       . pentosan polysulfate (ELMIRON) 100 MG capsule Take two by mouth two times daily       . polyethylene glycol powder (MIRALAX) powder Take 17 g by mouth daily.        . Azelaic Acid (FINACEA) 15 %  cream Apply to face twice a day       . Linaclotide (LINZESS) 290 MCG CAPS Take 1 capsule by mouth daily.  90 capsule  3  . lubiprostone (AMITIZA) 8 MCG capsule Take 8 mcg by mouth 2 (two) times daily with a meal.        . valACYclovir (VALTREX) 1000 MG tablet Take 2 tablets (2,000 mg total) by mouth 2 (two) times daily. As needed for an outbreak  4 tablet  5   No current facility-administered medications on file prior to visit.     Review of Systems Review of Systems  Constitutional: Negative for fever, appetite change, fatigue and unexpected weight change.  Eyes: Negative for pain and visual disturbance.  Respiratory: Negative for cough and shortness of breath.   Cardiovascular: Negative for cp or palpitations    Gastrointestinal: Negative for nausea, diarrhea and constipation.  Genitourinary: posfor urgency and frequency. pos for bladder pain and one episode of hematuria  Skin: Negative for pallor or rash   Neurological: Negative for weakness, light-headedness, numbness and headaches.  Hematological: Negative for adenopathy. Does not bruise/bleed easily.  Psychiatric/Behavioral: Negative for dysphoric mood. The patient is not nervous/anxious.         Objective:   Physical Exam  Constitutional: She appears well-developed and well-nourished. No distress.  HENT:  Head: Normocephalic and atraumatic.  Mouth/Throat: Oropharynx is clear and moist.  Eyes: Conjunctivae and EOM are normal. Pupils are equal, round, and reactive to light. Right eye exhibits no discharge. Left eye exhibits no discharge. No scleral icterus.  Neck: Normal range of motion. Neck supple.  Cardiovascular: Normal rate and regular rhythm.   Pulmonary/Chest: Effort normal and breath sounds normal.  Abdominal: Soft. Bowel sounds are normal. She exhibits no distension and no mass. There is tenderness. There is no rebound, no guarding and no CVA tenderness.  Lymphadenopathy:    She has no cervical adenopathy.   Neurological: She is alert.  Skin: Skin is warm and dry. No rash noted. No pallor.  Psychiatric: She has a normal mood and affect.          Assessment & Plan:

## 2012-07-24 NOTE — Patient Instructions (Addendum)
Drink lots of water  I will update you with urine culture result when it returns  Dr Logan Bores may want to put you on prophylaxis  Take the cipro as directed

## 2012-07-24 NOTE — Assessment & Plan Note (Signed)
After pt started doing her own bladder installations marcaine/ heparin for her IC (sees dr Logan Bores) tx with cipro cx urine and send to urology She may have to go on prophylaxis if she continues to self cath Will update if worse or no improvement

## 2012-07-26 LAB — URINE CULTURE: Colony Count: >=100000

## 2012-07-29 ENCOUNTER — Telehealth: Payer: Self-pay

## 2012-07-29 NOTE — Telephone Encounter (Signed)
I did notify pt of urine cx results and advise her they are on Mychart but pt wanted me to let you know the sxs she is having (see pre note) but doesn't know if it's related to a UTI or her IC, please advise

## 2012-07-29 NOTE — Telephone Encounter (Signed)
Finish antibiotic and then have her come in to leave a specimen-thanks

## 2012-07-29 NOTE — Telephone Encounter (Signed)
Pt left v/m request call back re: lab results; pt not sure antibiotic has worked yet; still has 2 antibiotics left to take. Pt still having frequency of urine and voiding small amounts.Pt has pain when urinates. [facility].

## 2012-07-30 NOTE — Telephone Encounter (Signed)
Nurse appt scheduled for 08/04/12 to recheck urine

## 2012-08-03 ENCOUNTER — Telehealth: Payer: Self-pay | Admitting: Family Medicine

## 2012-08-03 DIAGNOSIS — E559 Vitamin D deficiency, unspecified: Secondary | ICD-10-CM

## 2012-08-03 DIAGNOSIS — M899 Disorder of bone, unspecified: Secondary | ICD-10-CM

## 2012-08-03 DIAGNOSIS — E785 Hyperlipidemia, unspecified: Secondary | ICD-10-CM

## 2012-08-03 DIAGNOSIS — Z0000 Encounter for general adult medical examination without abnormal findings: Secondary | ICD-10-CM

## 2012-08-03 NOTE — Telephone Encounter (Signed)
Message copied by Judy Pimple on Mon Aug 03, 2012  9:56 PM ------      Message from: Baldomero Lamy      Created: Tue Jul 21, 2012  1:38 PM      Regarding: Cpx labs 5/6 Tues       Please order  future cpx labs for pt's upcoming lab appt.      Thanks      Tasha       ------

## 2012-08-04 ENCOUNTER — Other Ambulatory Visit: Payer: Medicare Other

## 2012-08-12 ENCOUNTER — Ambulatory Visit (INDEPENDENT_AMBULATORY_CARE_PROVIDER_SITE_OTHER): Payer: Medicare Other | Admitting: Family Medicine

## 2012-08-12 ENCOUNTER — Encounter: Payer: Self-pay | Admitting: Family Medicine

## 2012-08-12 VITALS — BP 114/78 | HR 71 | Temp 98.4°F | Ht 64.0 in | Wt 150.2 lb

## 2012-08-12 DIAGNOSIS — R82998 Other abnormal findings in urine: Secondary | ICD-10-CM

## 2012-08-12 DIAGNOSIS — N390 Urinary tract infection, site not specified: Secondary | ICD-10-CM

## 2012-08-12 DIAGNOSIS — R300 Dysuria: Secondary | ICD-10-CM

## 2012-08-12 DIAGNOSIS — K589 Irritable bowel syndrome without diarrhea: Secondary | ICD-10-CM

## 2012-08-12 LAB — POCT URINALYSIS DIPSTICK
Bilirubin, UA: NEGATIVE
Nitrite, UA: POSITIVE
pH, UA: 6.5

## 2012-08-12 MED ORDER — NITROFURANTOIN MONOHYD MACRO 100 MG PO CAPS: 100.0000 mg | ORAL_CAPSULE | Freq: Two times a day (BID) | ORAL | Status: DC

## 2012-08-12 NOTE — Progress Notes (Signed)
Subjective:    Patient ID: Lindsey [name], female    DOB: 04-28-1955, 57 y.o.   MRN: [id number]  HPI Here for uti symptoms  Was previously supposed to have a physical today- but does not feel well enough to do that   Was seen 4/25 and tx with cipro for uti cx grew e coli sens to cipro  (actually pan sensitive)  She did keep doing the bladder installations until this weekend - thinks that is making the problem much worse  Never got completely better  Initially some improvement in burning  Now severe burning and bladder pain with low back pain - feels awful  Finished abx - planned to re check a urine  Getting worse since the weekend  Has IC tx by Dr Logan Bores- her symptoms are much more severe today  She hurts all over  Doe snot think she has had a fever   "this is beyond body aches, trust me" Feels very bloated  Stomach hurts if she eats anything - her stomach starts churning and hurting   For the most part- stays constipated (occ gets blood in stool from hemorroids)   She sees Dr Logan Bores next mo for her 12 week appt   Patient Active Problem List   Diagnosis Date Noted  . UTI (urinary tract infection) 07/24/2012  . HSV (herpes simplex virus) infection 04/08/2012  . IBS (irritable bowel syndrome) 10/21/2011  . Routine general medical examination at a health care facility 07/02/2011  . OSTEOPENIA 05/17/2009  . DEPRESSION 10/05/2008  . GERD 05/05/2008  . OBSTRUCTIVE SLEEP APNEA 06/25/2007  . HYPERLIPIDEMIA 05/31/2007  . HYPERTENSION 05/31/2007  . SOMNOLENCE 04/23/2007  . DEFICIENCY, VITAMIN D NOS 01/22/2007  . MIGRAINE HEADACHE 01/22/2007  . CYSTITIS, CHRONIC INTERSTITIAL 01/22/2007  . MENOPAUSE-RELATED VASOMOTOR SYMPTOMS 01/22/2007  . OSTEOARTHROSIS, GENERALIZED, MULTIPLE SITES 01/22/2007  . FIBROMYALGIA 01/22/2007  . PATENT FORAMEN OVALE 01/22/2007   Past Medical History  Diagnosis Date  . Interstitial cystitis   . Pelvic floor dysfunction   . Arthritis   . Vitamin D  deficiency   . Chronic fatigue   . Tendonitis     hand  . Migraines   . PFO (patent foramen ovale)   . Fibromyalgia   . Hyperlipidemia   . Hypertension   . Hypersomnia   . IBS (irritable bowel syndrome)   . Hemorrhoids   . Osteopenia    Past Surgical History  Procedure Laterality Date  . Fracture surgery      ankle fracture x 2  . Breast lumpectomy      many, bilateral  . Vaginal hysterectomy  1990    because of the IC partial  . Nasal sinus surgery      multiple, x4  . Bladder surgery      and hydrodistension  . Cervical discectomy  2006    fusion and plating  . Colonoscopy    . Upper gastrointestinal endoscopy     History  Substance Use Topics  . Smoking status: Never Smoker   . Smokeless tobacco: Never Used  . Alcohol Use: No   Family History  Problem Relation Age of Onset  . Fibromyalgia Mother   . Diabetes Father   . Alcohol abuse Father   . Colon cancer Paternal Uncle   . Stomach cancer Paternal Grandfather   . Diabetes Paternal Grandmother   . Diabetes Paternal Grandfather   . Heart disease Father   . Stroke Paternal Grandmother    Allergies  Allergen Reactions  .  Buspirone Hcl     REACTION: dystonia  . Simvastatin     REACTION: malaise   Current Outpatient Prescriptions on File Prior to Visit  Medication Sig Dispense Refill  . atenolol (TENORMIN) 100 MG tablet Take 1 tablet (100 mg total) by mouth daily.  90 tablet  1  . Azelaic Acid (FINACEA) 15 % cream Apply to face twice a day       . baclofen (LIORESAL) 10 MG tablet Take as directed as needed       . Botulinum Toxin Type A (BOTOX) 200 UNITS SOLR Injected as directed for migraine management       . buPROPion (WELLBUTRIN XL) 150 MG 24 hr tablet Take 150 mg by mouth 2 (two) times daily.        . cholecalciferol (VITAMIN D) 1000 UNITS tablet Take 2,500 Units by mouth daily.      . diazepam (VALIUM) 5 MG tablet 1/2 in the AM and 1 in the PM       . dicyclomine (BENTYL) 20 MG tablet Take one tab  before meals  90 tablet  5  . estradiol (ESTRACE) 2 MG tablet Take one and one-half tablets once daily  135 tablet  3  . famotidine (PEPCID) 40 MG tablet Take 40 mg by mouth 2 (two) times daily.        . fentaNYL (DURAGESIC - DOSED MCG/HR) 50 MCG/HR Place 1 patch onto the skin every other day.        Marland Kitchen HYDROcodone-acetaminophen (LORCET) 10-650 MG per tablet Take 1 tablet by mouth every 6 (six) hours as needed.        . hydrOXYzine (ATARAX) 50 MG tablet Take 50 mg by mouth 2 (two) times daily.        Marland Kitchen lidocaine (LIDODERM) 5 % Place 1 patch onto the skin daily. Remove & Discard patch within 12 hours or as directed by MD       . NON FORMULARY Heparin/marcaine mix for bladder installations      . pentosan polysulfate (ELMIRON) 100 MG capsule Take two by mouth two times daily       . polyethylene glycol powder (MIRALAX) powder Take 17 g by mouth daily.         No current facility-administered medications on file prior to visit.    Review of Systems    Review of Systems  Constitutional: Negative for fever, appetite change,  and unexpected weight change. pos for fatigue and all over body pain  Eyes: Negative for pain and visual disturbance.  Respiratory: Negative for cough and shortness of breath.   Cardiovascular: Negative for cp or palpitations    Gastrointestinal: Negative for nausea, diarrhea and constipation.  Genitourinary: pos for urgency and frequency. neg for blood in urine  Skin: Negative for pallor or rash   Neurological: Negative for weakness, light-headedness, numbness and headaches.  MSK neg for joint swelling  Hematological: Negative for adenopathy. Does not bruise/bleed easily.  Psychiatric/Behavioral: pos for dysphoric mood. The patient is not nervous/anxious.      Objective:   Physical Exam  Constitutional: She appears well-developed and well-nourished. She appears distressed.  Pt is tearful throughout visit  HENT:  Head: Normocephalic and atraumatic.  Eyes:  Conjunctivae and EOM are normal. Pupils are equal, round, and reactive to light.  Neck: Normal range of motion. Neck supple.  Cardiovascular: Normal rate and regular rhythm.   Pulmonary/Chest: Effort normal and breath sounds normal.  Abdominal: Soft. Bowel sounds are normal. She  exhibits no distension and no mass. There is tenderness. There is no rebound and no guarding.  Tender diffusely in all quads of abdomen  Able to press farther with stethascope while pt is distracted  No rebound or guarding Worst pain is suprapubic  No M found   No cva tenderness - however she has some trigger points on her back     Musculoskeletal:  No acute joint changes  Lymphadenopathy:    She has no cervical adenopathy.  Neurological: She is alert. She has normal reflexes.  Skin: Skin is warm and dry. No rash noted. No pallor.  Psychiatric: Her speech is normal. Her mood appears not anxious. She exhibits a depressed mood.  Pt is tearful from pain throughout interview          Assessment & Plan:

## 2012-08-12 NOTE — Patient Instructions (Addendum)
Stop your bladder installations until further advisement from Dr Logan Bores Take the macrobid as directed Keep up your fluid intake  Get some align probiotic over the counter and take it for 1-2 weeks  If worse/ fever / vomiting- please call  We will culture urine again

## 2012-08-12 NOTE — Assessment & Plan Note (Signed)
?   Recurrent - last urine cx was pan sensitive and she took a course of cipro with some improvement ua very positive again  tx with macrobid (she has tolerated well in the past)  Re cx urine Results to Dr Logan Bores - she has f/u planned  [facility] if GI symptoms are related to cipro (no diarrhea however) Will try align otc for 1-2 wk

## 2012-08-13 LAB — POCT UA - MICROSCOPIC ONLY
Casts, Ur, LPF, POC: 0
Yeast, UA: 0

## 2012-08-13 NOTE — Assessment & Plan Note (Signed)
Pt c/o of stomach upset and bloating -consistent with her IBS but worse Suspect this may have been from cipro Adv to try align probiotic for 1-2 weeks  Will switch abx to macrobid -with known tolerance in the past

## 2012-08-14 LAB — URINE CULTURE: Colony Count: 5000

## 2012-08-18 ENCOUNTER — Telehealth: Payer: Self-pay | Admitting: Family Medicine

## 2012-08-18 NOTE — Telephone Encounter (Signed)
Patient Information:  Caller Name: Lindsey [name]  Phone: [phone]  Patient: Lindsey [name], Lindsey [name]  Gender: Female  DOB: June 08, 1955  Age: 57 Years  PCP: [facility] [employer] [facility])  Pregnant: No  Office Follow Up:  Does the office need to follow up with this patient?: No  Instructions For The Office: N/A  RN Note:  Patient states she developed a raised, tender area in the crease of her buttock, onset 08/17/12. States area is approx. the size of "the tip of my pinky finger." No drainage. Afebrile. Patient states she has difficulty sitting due to discomfort. Care advice given per guidelines. Patient advised to be seen in the Urgent Care now due to office closing at 1700. Patient advised not to drive self. Patient declines to be seen at Urgent Care and requests appt. for 08/19/12. Patient informed of possible consequences of delaying care (risk for systemic infection). Patient verbalizes understanding. Patient requesting appt. for 08/19/12. Call back parameters reviewed. Patient verbalizes understanding. Appt. scheduled for 08/19/12 1145 with Dr. Milinda Antis.   Symptoms  Reason For Call & Symptoms: Raised, red, tender area in crease of buttock  Reviewed Health History In EMR: Yes  Reviewed Medications In EMR: Yes  Reviewed Allergies In EMR: Yes  Reviewed Surgeries / Procedures: Yes  Date of Onset of Symptoms: 08/17/2012  Treatments Tried: Taclonex  Treatments Tried Worked: No OB / GYN:  LMP: Unknown  Guideline(s) Used:  Rectal Symptoms  Disposition Per Guideline:   See Today in Office  Reason For Disposition Reached:   Moderate-Severe rectal pain (i.e., interferes with school, work, or sleep)  Advice Given:  Treatment of Acute Rectal Pain due to Fecal Impaction:   SITZ Bath - Take a 20-minute bath in warm water. Add 2 oz (57 grams) of baking soda to the bath water. This is also called a "Sitz bath" and it often helps relax the anal sphincter and release the bowel movement (BM).  Call Back  If:  You become worse.  Patient Refused Recommendation:  Patient Refused Appt, Patient Requests Appt At Later Date  Patient requests appt. for 08/19/12  Appt. Scheduled for 08/19/12 1145 with Dr. Milinda Antis.

## 2012-08-18 NOTE — Telephone Encounter (Signed)
I will see her then  

## 2012-08-19 ENCOUNTER — Ambulatory Visit (INDEPENDENT_AMBULATORY_CARE_PROVIDER_SITE_OTHER): Payer: Medicare Other | Admitting: Family Medicine

## 2012-08-19 ENCOUNTER — Encounter: Payer: Self-pay | Admitting: Family Medicine

## 2012-08-19 VITALS — BP 122/82 | HR 59 | Temp 97.5°F | Ht 64.0 in | Wt 151.5 lb

## 2012-08-19 DIAGNOSIS — L989 Disorder of the skin and subcutaneous tissue, unspecified: Secondary | ICD-10-CM

## 2012-08-19 MED ORDER — MUPIROCIN 2 % EX OINT: TOPICAL_OINTMENT | Freq: Two times a day (BID) | CUTANEOUS | Status: DC

## 2012-08-19 NOTE — Patient Instructions (Addendum)
You have and irritated area on buttocks - and I want to avoid infection Keep clean with gentle soap and water - and avoid prolonged pressure  Use bactroban ointment 2-3 times per day  Let me know if it does not heal

## 2012-08-19 NOTE — Progress Notes (Signed)
Subjective:    Patient ID: Lindsey [name], female    DOB: 25-May-1955, 56 y.o.   MRN: [id number]  HPI Here with a sore in cleft of her buttocks - and was concerned Has been there for 2 days  Yesterday-was bigger -stable today  Took a valtrex yesterday also   Dermatologist had given her med for psoriasis on scalp in the past - ? Tac (steroid cream)   Area does not drain but is sore  Just a hard/ pink raised area   uti symptoms improved GI symptoms improved   Patient Active Problem List   Diagnosis Date Noted  . UTI (urinary tract infection) 07/24/2012  . HSV (herpes simplex virus) infection 04/08/2012  . IBS (irritable bowel syndrome) 10/21/2011  . Routine general medical examination at a health care facility 07/02/2011  . OSTEOPENIA 05/17/2009  . DEPRESSION 10/05/2008  . GERD 05/05/2008  . OBSTRUCTIVE SLEEP APNEA 06/25/2007  . HYPERLIPIDEMIA 05/31/2007  . HYPERTENSION 05/31/2007  . SOMNOLENCE 04/23/2007  . DEFICIENCY, VITAMIN D NOS 01/22/2007  . MIGRAINE HEADACHE 01/22/2007  . CYSTITIS, CHRONIC INTERSTITIAL 01/22/2007  . MENOPAUSE-RELATED VASOMOTOR SYMPTOMS 01/22/2007  . OSTEOARTHROSIS, GENERALIZED, MULTIPLE SITES 01/22/2007  . FIBROMYALGIA 01/22/2007  . PATENT FORAMEN OVALE 01/22/2007   Past Medical History  Diagnosis Date  . Interstitial cystitis   . Pelvic floor dysfunction   . Arthritis   . Vitamin D deficiency   . Chronic fatigue   . Tendonitis     hand  . Migraines   . PFO (patent foramen ovale)   . Fibromyalgia   . Hyperlipidemia   . Hypertension   . Hypersomnia   . IBS (irritable bowel syndrome)   . Hemorrhoids   . Osteopenia    Past Surgical History  Procedure Laterality Date  . Fracture surgery      ankle fracture x 2  . Breast lumpectomy      many, bilateral  . Vaginal hysterectomy  1990    because of the IC partial  . Nasal sinus surgery      multiple, x4  . Bladder surgery      and hydrodistension  . Cervical discectomy  2006    fusion  and plating  . Colonoscopy    . Upper gastrointestinal endoscopy     History  Substance Use Topics  . Smoking status: Never Smoker   . Smokeless tobacco: Never Used  . Alcohol Use: No   Family History  Problem Relation Age of Onset  . Fibromyalgia Mother   . Diabetes Father   . Alcohol abuse Father   . Colon cancer Paternal Uncle   . Stomach cancer Paternal Grandfather   . Diabetes Paternal Grandmother   . Diabetes Paternal Grandfather   . Heart disease Father   . Stroke Paternal Grandmother    Allergies  Allergen Reactions  . Buspirone Hcl     REACTION: dystonia  . Simvastatin     REACTION: malaise  . Sulfa Antibiotics     nausea   Current Outpatient Prescriptions on File Prior to Visit  Medication Sig Dispense Refill  . atenolol (TENORMIN) 100 MG tablet Take 1 tablet (100 mg total) by mouth daily.  90 tablet  1  . Azelaic Acid (FINACEA) 15 % cream Apply to face twice a day       . baclofen (LIORESAL) 10 MG tablet Take as directed as needed       . Botulinum Toxin Type A (BOTOX) 200 UNITS SOLR Injected as directed for  migraine management       . buPROPion (WELLBUTRIN XL) 150 MG 24 hr tablet Take 150 mg by mouth 2 (two) times daily.        . calcipotriene-betamethasone (TACLONEX) ointment Apply topically daily.      . cholecalciferol (VITAMIN D) 1000 UNITS tablet Take 2,500 Units by mouth daily.      . diazepam (VALIUM) 5 MG tablet 1/2 in the AM and 1 in the PM       . dicyclomine (BENTYL) 20 MG tablet Take one tab before meals  90 tablet  5  . estradiol (ESTRACE) 2 MG tablet Take one and one-half tablets once daily  135 tablet  3  . famotidine (PEPCID) 40 MG tablet Take 40 mg by mouth 2 (two) times daily.        . fentaNYL (DURAGESIC - DOSED MCG/HR) 50 MCG/HR Place 1 patch onto the skin every other day.        Marland Kitchen HYDROcodone-acetaminophen (LORCET) 10-650 MG per tablet Take 1 tablet by mouth every 6 (six) hours as needed.        . hydrOXYzine (ATARAX) 50 MG tablet Take 50  mg by mouth 2 (two) times daily.        Marland Kitchen lidocaine (LIDODERM) 5 % Place 1 patch onto the skin daily. Remove & Discard patch within 12 hours or as directed by MD       . NON FORMULARY Heparin/marcaine mix for bladder installations      . pentosan polysulfate (ELMIRON) 100 MG capsule Take two by mouth two times daily       . polyethylene glycol powder (MIRALAX) powder Take 17 g by mouth daily.         No current facility-administered medications on file prior to visit.      Review of Systems     Objective:   Physical Exam  Constitutional: She appears well-developed and well-nourished.  Skin: Skin is warm and dry. No rash noted. There is erythema. No pallor.  1 cm oval area of irritation and denuded skin / scant erythema at R upper cleft of buttocks No signs of cellulitis or cyst or abcess   No excoriation or vesicles  Psychiatric: She has a normal mood and affect.          Assessment & Plan:

## 2012-08-19 NOTE — Assessment & Plan Note (Signed)
Irritated area R upper buttock cleft -not ulcerated, but almost looks like early pressure ulcer  No s/s of cellulitis-but will watch for / and this does not resemble cyst or abcess Adv to keep clean/ avoid prolonged pressure and use bactroban oint 2-3 times daily  Update if not starting to improve in a week or if worsening

## 2012-09-04 ENCOUNTER — Encounter: Payer: Self-pay | Admitting: Family Medicine

## 2012-09-04 ENCOUNTER — Ambulatory Visit (INDEPENDENT_AMBULATORY_CARE_PROVIDER_SITE_OTHER): Payer: Medicare Other | Admitting: Family Medicine

## 2012-09-04 VITALS — BP 114/68 | HR 59 | Temp 98.2°F | Ht 64.0 in | Wt 152.5 lb

## 2012-09-04 DIAGNOSIS — R7989 Other specified abnormal findings of blood chemistry: Secondary | ICD-10-CM

## 2012-09-04 DIAGNOSIS — R946 Abnormal results of thyroid function studies: Secondary | ICD-10-CM

## 2012-09-04 DIAGNOSIS — M949 Disorder of cartilage, unspecified: Secondary | ICD-10-CM

## 2012-09-04 DIAGNOSIS — Z0000 Encounter for general adult medical examination without abnormal findings: Secondary | ICD-10-CM

## 2012-09-04 DIAGNOSIS — E785 Hyperlipidemia, unspecified: Secondary | ICD-10-CM

## 2012-09-04 DIAGNOSIS — E559 Vitamin D deficiency, unspecified: Secondary | ICD-10-CM

## 2012-09-04 DIAGNOSIS — I10 Essential (primary) hypertension: Secondary | ICD-10-CM

## 2012-09-04 DIAGNOSIS — M899 Disorder of bone, unspecified: Secondary | ICD-10-CM

## 2012-09-04 DIAGNOSIS — Z23 Encounter for immunization: Secondary | ICD-10-CM

## 2012-09-04 NOTE — Assessment & Plan Note (Signed)
Much improved with current dose Disc imp for overall and bone health

## 2012-09-04 NOTE — Assessment & Plan Note (Signed)
tsh slt low - borderline Will re check 3 mo  Sub clinical

## 2012-09-04 NOTE — Assessment & Plan Note (Signed)
Disc goals for lipids and reasons to control them Rev labs with pt Rev low sat fat diet in detail  Disc goal of LDL under 130 Rev low sat fat diet  HDL is very good -protective

## 2012-09-04 NOTE — Assessment & Plan Note (Signed)
dexa 2013  Mild No further fx Good D level Enc her to be active Will follow

## 2012-09-04 NOTE — Assessment & Plan Note (Signed)
bp in fair control at this time  No changes needed  Disc lifstyle change with low sodium diet and exercise  Lab reviewed  

## 2012-09-04 NOTE — Patient Instructions (Addendum)
Take care of yourself  Stay as active as you can  We need to re check your thyroid test in 3 months- schedule non fasting lab in 3 months  If you are interested in a shingles/zoster vaccine - call your insurance to check on coverage,( you should not get it within 1 month of other vaccines) , then call us for a prescription  for it to take to a pharmacy that gives the shot , or make a nurse visit to get it here depending on your coverage

## 2012-09-04 NOTE — Assessment & Plan Note (Signed)
Reviewed health habits including diet and exercise and skin cancer prevention Also reviewed health mt list, fam hx and immunizations  See HPI Labs reviewed  

## 2012-09-04 NOTE — Progress Notes (Signed)
Subjective:    Patient ID: Lindsey [name], female    DOB: 05-15-1955, 57 y.o.   MRN: [id number]  HPI I have personally reviewed the Medicare Annual Wellness questionnaire and have noted 1. The patient's medical and social history 2. Their use of alcohol, tobacco or illicit drugs 3. Their current medications and supplements 4. The patient's functional ability including ADL's, fall risks, home safety risks and hearing or visual             impairment. 5. Diet and physical activities 6. Evidence for depression or mood disorders  The patients weight, height, BMI have been recorded in the chart and visual acuity is per eye clinic.  I have made referrals, counseling and provided education to the patient based review of the above and I have provided the pt with a written personalized care plan for preventive services.  Last visit had a lot of urinary pain  Saw Dr Logan Bores  Putting her in a drug trial and also trying some valium suppositories  Back to baseline   Was dx with psoriasis and ? Psoriatic arthritis - ? Unsure at some point  Lesions are on head   See scanned forms.  Routine anticipatory guidance given to patient.  See health maintenance. Flu got at [facility] in the fall  Shingles- is interested in vaccine if ins would cover  PNA- has not needed vaccine but has had pneumonia (in her 30s) Tetanus vaccine 1/09 Colon 5/12 - thinks her recall is 5 years- cannot remember  Breast cancer screening 2/14 with bx was benign  Pap smear 07- has had hysterectomy , no hx of cervical cancer in the past  Advance directive-does not have one- will address that  Cognitive function addressed- see scanned forms- and if abnormal then additional documentation follows.  Pt has a constant fog due to her medications and her health problems -- she has a generally bad memory and also sleeps a lot - chronic fatigue - husband watches her closely and she drives with limits   PMH and SH reviewed  Meds, vitals, and  allergies reviewed.   ROS: See HPI.  Otherwise negative.    Falls - occ trips/ clumsy- will run into things - but does not fall down - has to be careful about balance   Mood- does ok for what she has to deal with (chronic illness and pain) - has tried hypnosis and counseling  Has good support and has ability to cry , she does stay motivated , still gets joy out of things - limited in what she can do   Wt is up 1 lb  dexa 4/13  Mild openia Hx of ankle fx  D level- takes her vitamin D- 65  Hyperlipidemia Lab Results  Component Value Date   CHOL 242* 07/24/2012   CHOL 249* 07/03/2011   CHOL 236* 07/25/2010   Lab Results  Component Value Date   HDL 68.50 07/24/2012   HDL 40.98 07/03/2011   HDL 64.00 07/25/2010   Lab Results  Component Value Date   LDLCALC 93 01/17/2010   LDLCALC 153* 05/06/2008   Lab Results  Component Value Date   TRIG 104.0 07/24/2012   TRIG 169.0* 07/03/2011   TRIG 156.0* 07/25/2010   Lab Results  Component Value Date   CHOLHDL 4 07/24/2012   CHOLHDL 3 07/03/2011   CHOLHDL 4 07/25/2010   Lab Results  Component Value Date   LDLDIRECT 166.1 07/24/2012   LDLDIRECT 161.5 07/03/2011   LDLDIRECT 149.3  07/25/2010   overall fairly stable -eats too much ice cream - HDL is good   bp is stable today  No cp or palpitations or headaches or edema  No side effects to medicines  BP Readings from Last 3 Encounters:  09/04/12 114/68  08/19/12 122/82  08/12/12 114/78      Lab Results  Component Value Date   TSH 5.67* 07/24/2012    Will watch this -no hx of hypothyroid    Patient Active Problem List   Diagnosis Date Noted  . Encounter for Medicare annual wellness exam 09/04/2012  . Skin lesion 08/19/2012  . UTI (urinary tract infection) 07/24/2012  . HSV (herpes simplex virus) infection 04/08/2012  . IBS (irritable bowel syndrome) 10/21/2011  . Routine general medical examination at a health care facility 07/02/2011  . OSTEOPENIA 05/17/2009  . DEPRESSION 10/05/2008   . GERD 05/05/2008  . OBSTRUCTIVE SLEEP APNEA 06/25/2007  . HYPERLIPIDEMIA 05/31/2007  . HYPERTENSION 05/31/2007  . SOMNOLENCE 04/23/2007  . DEFICIENCY, VITAMIN D NOS 01/22/2007  . MIGRAINE HEADACHE 01/22/2007  . CYSTITIS, CHRONIC INTERSTITIAL 01/22/2007  . MENOPAUSE-RELATED VASOMOTOR SYMPTOMS 01/22/2007  . OSTEOARTHROSIS, GENERALIZED, MULTIPLE SITES 01/22/2007  . FIBROMYALGIA 01/22/2007  . PATENT FORAMEN OVALE 01/22/2007   Past Medical History  Diagnosis Date  . Interstitial cystitis   . Pelvic floor dysfunction   . Arthritis   . Vitamin D deficiency   . Chronic fatigue   . Tendonitis     hand  . Migraines   . PFO (patent foramen ovale)   . Fibromyalgia   . Hyperlipidemia   . Hypertension   . Hypersomnia   . IBS (irritable bowel syndrome)   . Hemorrhoids   . Osteopenia    Past Surgical History  Procedure Laterality Date  . Fracture surgery      ankle fracture x 2  . Breast lumpectomy      many, bilateral  . Vaginal hysterectomy  1990    because of the IC partial  . Nasal sinus surgery      multiple, x4  . Bladder surgery      and hydrodistension  . Cervical discectomy  2006    fusion and plating  . Colonoscopy    . Upper gastrointestinal endoscopy     History  Substance Use Topics  . Smoking status: Never Smoker   . Smokeless tobacco: Never Used  . Alcohol Use: No   Family History  Problem Relation Age of Onset  . Fibromyalgia Mother   . Diabetes Father   . Alcohol abuse Father   . Colon cancer Paternal Uncle   . Stomach cancer Paternal Grandfather   . Diabetes Paternal Grandmother   . Diabetes Paternal Grandfather   . Heart disease Father   . Stroke Paternal Grandmother    Allergies  Allergen Reactions  . Buspirone Hcl     REACTION: dystonia  . Simvastatin     REACTION: malaise  . Sulfa Antibiotics     nausea   Current Outpatient Prescriptions on File Prior to Visit  Medication Sig Dispense Refill  . atenolol (TENORMIN) 100 MG tablet  Take 1 tablet (100 mg total) by mouth daily.  90 tablet  1  . baclofen (LIORESAL) 10 MG tablet Take as directed as needed       . Botulinum Toxin Type A (BOTOX) 200 UNITS SOLR Injected as directed for migraine management       . buPROPion (WELLBUTRIN XL) 150 MG 24 hr tablet Take 150 mg by  mouth 2 (two) times daily.        . calcipotriene-betamethasone (TACLONEX) ointment Apply topically daily.      . cholecalciferol (VITAMIN D) 1000 UNITS tablet Take 2,500 Units by mouth daily.      . diazepam (VALIUM) 5 MG tablet 1/2 in the AM and 1 in the PM       . dicyclomine (BENTYL) 20 MG tablet Take one tab before meals  90 tablet  5  . estradiol (ESTRACE) 2 MG tablet Take one and one-half tablets once daily  135 tablet  3  . famotidine (PEPCID) 40 MG tablet Take 40 mg by mouth 2 (two) times daily.        . fentaNYL (DURAGESIC - DOSED MCG/HR) 50 MCG/HR Place 1 patch onto the skin every other day.        Marland Kitchen HYDROcodone-acetaminophen (LORCET) 10-650 MG per tablet Take 1 tablet by mouth every 6 (six) hours as needed.        . hydrOXYzine (ATARAX) 50 MG tablet Take 50 mg by mouth 2 (two) times daily.        Marland Kitchen lidocaine (LIDODERM) 5 % Place 1 patch onto the skin daily. Remove & Discard patch within 12 hours or as directed by MD       . NON FORMULARY Heparin/marcaine mix for bladder installations      . pentosan polysulfate (ELMIRON) 100 MG capsule Take two by mouth two times daily       . polyethylene glycol powder (MIRALAX) powder Take 17 g by mouth daily.        . mupirocin ointment (BACTROBAN) 2 % Apply topically 2 (two) times daily. To affected area  15 g  0   No current facility-administered medications on file prior to visit.    Review of Systems Review of Systems  Constitutional: Negative for fever, appetite change, and unexpected weight change.  Eyes: Negative for pain and visual disturbance.  Respiratory: Negative for cough and shortness of breath.   Cardiovascular: Negative for cp or palpitations     Gastrointestinal: Negative for nausea, diarrhea and constipation.  Genitourinary: pos for urgency and frequency. with baseline pelvic pain  Skin: Negative for pallor and pos for psoriasis rash MSK pos for joint and myofascial pain Neurological: Negative for weakness, light-headedness, numbness and headaches.  Hematological: Negative for adenopathy. Does not bruise/bleed easily.  Psychiatric/Behavioral: Negative for dysphoric mood. The patient is not nervous/anxious.         Objective:   Physical Exam  Constitutional: She appears well-developed and well-nourished. No distress.  HENT:  Head: Normocephalic and atraumatic.  Right Ear: External ear normal.  Left Ear: External ear normal.  Nose: Nose normal.  Mouth/Throat: Oropharynx is clear and moist.  Eyes: Conjunctivae and EOM are normal. Pupils are equal, round, and reactive to light. Right eye exhibits no discharge. Left eye exhibits no discharge. No scleral icterus.  Neck: Normal range of motion. Neck supple. No JVD present. Carotid bruit is not present. No thyromegaly present.  Cardiovascular: Normal rate, regular rhythm, normal heart sounds and intact distal pulses.  Exam reveals no gallop.   Pulmonary/Chest: Effort normal and breath sounds normal. No respiratory distress. She has no wheezes. She exhibits no tenderness.  Abdominal: Soft. Bowel sounds are normal. She exhibits no distension, no abdominal bruit and no mass. There is no tenderness.  Genitourinary: No breast swelling, tenderness, discharge or bleeding.  Breast exam: No mass, nodules, thickening, tenderness, bulging, retraction, inflamation, nipple discharge or skin changes  noted.  No axillary or clavicular LA.  Chaperoned exam.    Musculoskeletal: She exhibits no edema and no tenderness.  Lymphadenopathy:    She has no cervical adenopathy.  Neurological: She is alert. She has normal reflexes. No cranial nerve deficit. She exhibits normal muscle tone. Coordination  normal.  Skin: Skin is warm and dry. No rash noted. No erythema. No pallor.  Psychiatric: She has a normal mood and affect.          Assessment & Plan:

## 2012-10-28 ENCOUNTER — Other Ambulatory Visit: Payer: Self-pay | Admitting: Family Medicine

## 2012-10-28 MED ORDER — ATENOLOL 100 MG PO TABS: 100.0000 mg | ORAL_TABLET | Freq: Every day | ORAL | Status: DC

## 2012-11-27 ENCOUNTER — Other Ambulatory Visit (INDEPENDENT_AMBULATORY_CARE_PROVIDER_SITE_OTHER): Payer: Medicare Other

## 2012-11-27 DIAGNOSIS — E559 Vitamin D deficiency, unspecified: Secondary | ICD-10-CM

## 2012-11-27 DIAGNOSIS — E785 Hyperlipidemia, unspecified: Secondary | ICD-10-CM

## 2012-11-27 DIAGNOSIS — M899 Disorder of bone, unspecified: Secondary | ICD-10-CM

## 2012-11-27 DIAGNOSIS — Z0000 Encounter for general adult medical examination without abnormal findings: Secondary | ICD-10-CM

## 2012-11-27 LAB — LIPID PANEL
HDL: 78.7 mg/dL (ref >39.00)
Triglycerides: 121 mg/dL (ref 0.0–149.0)

## 2012-11-27 LAB — CBC WITH DIFFERENTIAL/PLATELET
Basophils Absolute: 0 10*3/uL (ref 0.0–0.1)
Eosinophils Absolute: 0.1 10*3/uL (ref 0.0–0.7)
Lymphocytes Relative: 53.6 % — ABNORMAL HIGH (ref 12.0–46.0)
MCHC: 33.5 g/dL (ref 30.0–36.0)
MCV: 90.6 fl (ref 78.0–100.0)
Monocytes Absolute: 0.4 10*3/uL (ref 0.1–1.0)
Neutrophils Relative %: 37 % — ABNORMAL LOW (ref 43.0–77.0)
Platelets: 193 10*3/uL (ref 150.0–400.0)
RDW: 13.5 % (ref 11.5–14.6)

## 2012-12-04 ENCOUNTER — Encounter: Payer: Self-pay | Admitting: Family Medicine

## 2012-12-04 ENCOUNTER — Ambulatory Visit (INDEPENDENT_AMBULATORY_CARE_PROVIDER_SITE_OTHER): Payer: Medicare Other | Admitting: Family Medicine

## 2012-12-04 VITALS — BP 122/84 | HR 61 | Temp 98.4°F | Ht 64.0 in | Wt 153.2 lb

## 2012-12-04 DIAGNOSIS — R7989 Other specified abnormal findings of blood chemistry: Secondary | ICD-10-CM

## 2012-12-04 DIAGNOSIS — E559 Vitamin D deficiency, unspecified: Secondary | ICD-10-CM

## 2012-12-04 DIAGNOSIS — E785 Hyperlipidemia, unspecified: Secondary | ICD-10-CM

## 2012-12-04 DIAGNOSIS — R946 Abnormal results of thyroid function studies: Secondary | ICD-10-CM

## 2012-12-04 NOTE — Progress Notes (Signed)
Subjective:    Patient ID: Lindsey [name], female    DOB: 1955/09/08, 56 y.o.   MRN: [id number]  HPI Here for f/u of chronic medical conditions  Last visit tsh was high -over 5 Better this time  Lab Results  Component Value Date   TSH 1.83 11/27/2012     Lipids improved as well Lab Results  Component Value Date   CHOL 228* 11/27/2012   CHOL 242* 07/24/2012   CHOL 249* 07/03/2011   Lab Results  Component Value Date   HDL 78.70 11/27/2012   HDL 57.84 07/24/2012   HDL 69.62 07/03/2011   Lab Results  Component Value Date   LDLCALC 93 01/17/2010   LDLCALC 153* 05/06/2008   Lab Results  Component Value Date   TRIG 121.0 11/27/2012   TRIG 104.0 07/24/2012   TRIG 169.0* 07/03/2011   Lab Results  Component Value Date   CHOLHDL 3 11/27/2012   CHOLHDL 4 07/24/2012   CHOLHDL 3 07/03/2011   Lab Results  Component Value Date   LDLDIRECT 137.4 11/27/2012   LDLDIRECT 166.1 07/24/2012   LDLDIRECT 161.5 07/03/2011   she does not think she is eating more carefully Is happy with that    D level is 86   Takes 2000 iu bid  Will drop that to once her day   Just started cymbalta - to see if it would help with chronic pain/ discomfort    Patient Active Problem List   Diagnosis Date Noted  . Encounter for Medicare annual wellness exam 09/04/2012  . Low TSH level 09/04/2012  . Skin lesion 08/19/2012  . HSV (herpes simplex virus) infection 04/08/2012  . IBS (irritable bowel syndrome) 10/21/2011  . Routine general medical examination at a health care facility 07/02/2011  . OSTEOPENIA 05/17/2009  . DEPRESSION 10/05/2008  . GERD 05/05/2008  . OBSTRUCTIVE SLEEP APNEA 06/25/2007  . HYPERLIPIDEMIA 05/31/2007  . HYPERTENSION 05/31/2007  . SOMNOLENCE 04/23/2007  . DEFICIENCY, VITAMIN D NOS 01/22/2007  . MIGRAINE HEADACHE 01/22/2007  . CYSTITIS, CHRONIC INTERSTITIAL 01/22/2007  . MENOPAUSE-RELATED VASOMOTOR SYMPTOMS 01/22/2007  . OSTEOARTHROSIS, GENERALIZED, MULTIPLE SITES 01/22/2007  .  FIBROMYALGIA 01/22/2007  . PATENT FORAMEN OVALE 01/22/2007   Past Medical History  Diagnosis Date  . Interstitial cystitis   . Pelvic floor dysfunction   . Arthritis   . Vitamin D deficiency   . Chronic fatigue   . Tendonitis     hand  . Migraines   . PFO (patent foramen ovale)   . Fibromyalgia   . Hyperlipidemia   . Hypertension   . Hypersomnia   . IBS (irritable bowel syndrome)   . Hemorrhoids   . Osteopenia    Past Surgical History  Procedure Laterality Date  . Fracture surgery      ankle fracture x 2  . Breast lumpectomy      many, bilateral  . Vaginal hysterectomy  1990    because of the IC partial  . Nasal sinus surgery      multiple, x4  . Bladder surgery      and hydrodistension  . Cervical discectomy  2006    fusion and plating  . Colonoscopy    . Upper gastrointestinal endoscopy     History  Substance Use Topics  . Smoking status: Never Smoker   . Smokeless tobacco: Never Used  . Alcohol Use: No   Family History  Problem Relation Age of Onset  . Fibromyalgia Mother   . Diabetes Father   .  Alcohol abuse Father   . Colon cancer Paternal Uncle   . Stomach cancer Paternal Grandfather   . Diabetes Paternal Grandmother   . Diabetes Paternal Grandfather   . Heart disease Father   . Stroke Paternal Grandmother    Allergies  Allergen Reactions  . Buspirone Hcl     REACTION: dystonia  . Simvastatin     REACTION: malaise  . Sulfa Antibiotics     nausea   Current Outpatient Prescriptions on File Prior to Visit  Medication Sig Dispense Refill  . atenolol (TENORMIN) 100 MG tablet Take 1 tablet (100 mg total) by mouth daily.  90 tablet  0  . baclofen (LIORESAL) 10 MG tablet Take as directed as needed       . Botulinum Toxin Type A (BOTOX) 200 UNITS SOLR Injected as directed for migraine management       . calcipotriene-betamethasone (TACLONEX) ointment Apply topically daily.      . cholecalciferol (VITAMIN D) 1000 UNITS tablet Take 4,000 Units by  mouth daily.       . diazepam (VALIUM) 5 MG tablet 1 TAB EVERY PM      . dicyclomine (BENTYL) 20 MG tablet Take one tab before meals  90 tablet  5  . estradiol (ESTRACE) 2 MG tablet Take one and one-half tablets once daily  135 tablet  3  . fentaNYL (DURAGESIC - DOSED MCG/HR) 50 MCG/HR Place 1 patch onto the skin every other day.        Marland Kitchen HYDROcodone-acetaminophen (LORCET) 10-650 MG per tablet Take 1 tablet by mouth every 6 (six) hours as needed.        . hydrOXYzine (ATARAX) 50 MG tablet Take 50 mg by mouth 2 (two) times daily.        Marland Kitchen lidocaine (LIDODERM) 5 % Place 1 patch onto the skin daily. Remove & Discard patch within 12 hours or as directed by MD       . mupirocin ointment (BACTROBAN) 2 % Apply topically 2 (two) times daily. To affected area  15 g  0  . pentosan polysulfate (ELMIRON) 100 MG capsule Take two by mouth two times daily       . polyethylene glycol powder (MIRALAX) powder Take 17 g by mouth daily.         No current facility-administered medications on file prior to visit.    Review of Systems Review of Systems  Constitutional: Negative for fever, appetite change, and unexpected weight change. pos for fatigue  Eyes: Negative for pain and visual disturbance.  Respiratory: Negative for cough and shortness of breath.   Cardiovascular: Negative for cp or palpitations    Gastrointestinal: Negative for nausea, diarrhea and constipation.  Genitourinary: Negative for urgency and frequency. pos for chronic dysuria and bladder pain  MSK pos for chronic all over body pain  Skin: Negative for pallor or rash   Neurological: Negative for weakness, light-headedness, numbness and headaches.  Hematological: Negative for adenopathy. Does not bruise/bleed easily.  Psychiatric/Behavioral: Negative for dysphoric mood. The patient is not nervous/anxious.         Objective:   Physical Exam  Constitutional: She appears well-developed and well-nourished. No distress.  HENT:  Head:  Normocephalic and atraumatic.  Mouth/Throat: Oropharynx is clear and moist.  Eyes: Conjunctivae and EOM are normal. Pupils are equal, round, and reactive to light.  Neck: Normal range of motion. Neck supple. No JVD present. Carotid bruit is not present. No thyromegaly present.  Cardiovascular: Normal rate and regular  rhythm.   Abdominal: Soft. Bowel sounds are normal. There is tenderness in the suprapubic area.  Musculoskeletal: She exhibits no edema.  Lymphadenopathy:    She has no cervical adenopathy.  Neurological: She is alert. She displays no tremor.  Skin: Skin is warm and dry. No rash noted.  Psychiatric: She has a normal mood and affect.          Assessment & Plan:

## 2012-12-04 NOTE — Patient Instructions (Addendum)
Take care of yourself  I hope the cymbalta helps Thyroid lab is normal  Cholesterol is improved  D level is almost too high - take dose down to 2000 iu once daily

## 2012-12-06 NOTE — Assessment & Plan Note (Signed)
Repeat test came back in normal range No clinical changes  Will continue to follow over time

## 2012-12-06 NOTE — Assessment & Plan Note (Signed)
Level is approaching upper limit of nl again Will reduce dose to once daily 2000 iu  Will continue to follow

## 2012-12-06 NOTE — Assessment & Plan Note (Signed)
Disc goals for lipids and reasons to control them Rev labs with pt Rev low sat fat diet in detail   

## 2012-12-29 ENCOUNTER — Other Ambulatory Visit: Payer: Self-pay | Admitting: Family Medicine

## 2012-12-29 MED ORDER — ESTRADIOL 2 MG PO TABS: ORAL_TABLET | ORAL | Status: DC

## 2013-01-28 ENCOUNTER — Other Ambulatory Visit: Payer: Self-pay | Admitting: Family Medicine

## 2013-01-28 MED ORDER — ATENOLOL 100 MG PO TABS: 100.0000 mg | ORAL_TABLET | Freq: Every day | ORAL | Status: DC

## 2013-02-04 ENCOUNTER — Other Ambulatory Visit: Payer: Self-pay

## 2013-03-17 ENCOUNTER — Other Ambulatory Visit: Payer: Self-pay | Admitting: Family Medicine

## 2013-03-17 DIAGNOSIS — M8580 Other specified disorders of bone density and structure, unspecified site: Secondary | ICD-10-CM

## 2013-03-18 ENCOUNTER — Other Ambulatory Visit: Payer: Self-pay | Admitting: Rheumatology

## 2013-03-18 DIAGNOSIS — M8580 Other specified disorders of bone density and structure, unspecified site: Secondary | ICD-10-CM

## 2013-05-20 ENCOUNTER — Other Ambulatory Visit: Payer: Self-pay

## 2013-05-20 DIAGNOSIS — Z1231 Encounter for screening mammogram for malignant neoplasm of breast: Secondary | ICD-10-CM

## 2013-07-29 ENCOUNTER — Ambulatory Visit
Admission: RE | Admit: 2013-07-29 | Discharge: 2013-07-29 | Disposition: A | Discharge status: Home / Self Care | Payer: Medicare Other | Source: Ambulatory Visit

## 2013-07-29 ENCOUNTER — Ambulatory Visit
Admission: RE | Admit: 2013-07-29 | Discharge: 2013-07-29 | Disposition: A | Discharge status: Home / Self Care | Payer: Medicare Other | Source: Ambulatory Visit | Attending: Rheumatology | Admitting: Rheumatology

## 2013-07-29 DIAGNOSIS — Z1231 Encounter for screening mammogram for malignant neoplasm of breast: Secondary | ICD-10-CM

## 2013-07-29 DIAGNOSIS — M8580 Other specified disorders of bone density and structure, unspecified site: Secondary | ICD-10-CM

## 2013-09-03 ENCOUNTER — Other Ambulatory Visit: Payer: Self-pay | Admitting: Family Medicine

## 2013-09-23 ENCOUNTER — Ambulatory Visit (INDEPENDENT_AMBULATORY_CARE_PROVIDER_SITE_OTHER): Payer: Medicare Other | Admitting: Family Medicine

## 2013-09-23 ENCOUNTER — Encounter: Payer: Self-pay | Admitting: Family Medicine

## 2013-09-23 VITALS — BP 124/82 | HR 68 | Temp 97.9°F | Wt 159.5 lb

## 2013-09-23 DIAGNOSIS — R8290 Unspecified abnormal findings in urine: Secondary | ICD-10-CM

## 2013-09-23 DIAGNOSIS — R82998 Other abnormal findings in urine: Secondary | ICD-10-CM

## 2013-09-23 DIAGNOSIS — N10 Acute tubulo-interstitial nephritis: Secondary | ICD-10-CM

## 2013-09-23 LAB — POCT URINALYSIS DIPSTICK
BILIRUBIN UA: NEGATIVE
GLUCOSE UA: NEGATIVE
KETONES UA: NEGATIVE
Nitrite, UA: NEGATIVE
SPEC GRAV UA: 1.015
Urobilinogen, UA: 0.2
pH, UA: 6.5

## 2013-09-23 MED ORDER — CEFTRIAXONE SODIUM 1 G IJ SOLR
1.0000 g | Freq: Once | INTRAMUSCULAR | Status: AC
  Administered 2013-09-23: 1 g via INTRAMUSCULAR

## 2013-09-23 MED ORDER — CIPROFLOXACIN HCL 500 MG PO TABS: 500.0000 mg | ORAL_TABLET | Freq: Two times a day (BID) | ORAL | Status: DC

## 2013-09-23 NOTE — Addendum Note (Signed)
Addended by: Royann Shivers A on: 09/23/2013 05:10 PM   Modules accepted: Orders

## 2013-09-23 NOTE — Progress Notes (Signed)
Pre visit review using our clinic review tool, if applicable. No additional management support is needed unless otherwise documented below in the visit note. 

## 2013-09-23 NOTE — Patient Instructions (Signed)
I do think you have urine infection - possibly kidney infection. Treat with rocephin shot in office today Take cipro antibiotic twice daily for 7 days. Push fluids and plenty of rest.  Pyelonephritis, Adult Pyelonephritis is a kidney infection. In general, there are 2 main types of pyelonephritis:  Infections that come on quickly without any warning (acute pyelonephritis).  Infections that persist for a long period of time (chronic pyelonephritis). CAUSES  Two main causes of pyelonephritis are:  Bacteria traveling from the bladder to the kidney. This is a problem especially in pregnant women. The urine in the bladder can become filled with bacteria from multiple causes, including:  Inflammation of the prostate gland (prostatitis).  Sexual intercourse in females.  Bladder infection (cystitis).  Bacteria traveling from the bloodstream to the tissue part of the kidney. Problems that may increase your risk of getting a kidney infection include:  Diabetes.  Kidney stones or bladder stones.  Cancer.  Catheters placed in the bladder.  Other abnormalities of the kidney or ureter. SYMPTOMS   Abdominal pain.  Pain in the side or flank area.  Fever.  Chills.  Upset stomach.  Blood in the urine (dark urine).  Frequent urination.  Strong or persistent urge to urinate.  Burning or stinging when urinating. DIAGNOSIS  Your caregiver may diagnose your kidney infection based on your symptoms. A urine sample may also be taken. TREATMENT  In general, treatment depends on how severe the infection is.   If the infection is mild and caught early, your caregiver may treat you with oral antibiotics and send you home.  If the infection is more severe, the bacteria may have gotten into the bloodstream. This will require intravenous (IV) antibiotics and a hospital stay. Symptoms may include:  High fever.  Severe flank pain.  Shaking chills.  Even after a hospital stay, your  caregiver may require you to be on oral antibiotics for a period of time.  Other treatments may be required depending upon the cause of the infection. HOME CARE INSTRUCTIONS   Take your antibiotics as directed. Finish them even if you start to feel better.  Make an appointment to have your urine checked to make sure the infection is gone.  Drink enough fluids to keep your urine clear or pale yellow.  Take medicines for the bladder if you have urgency and frequency of urination as directed by your caregiver. SEEK IMMEDIATE MEDICAL CARE IF:   You have a fever or persistent symptoms for more than 2-3 days.  You have a fever and your symptoms suddenly get worse.  You are unable to take your antibiotics or fluids.  You develop shaking chills.  You experience extreme weakness or fainting.  There is no improvement after 2 days of treatment. MAKE SURE YOU:  Understand these instructions.  Will watch your condition.  Will get help right away if you are not doing well or get worse. Document Released: 03/18/2005 Document Revised: 09/17/2011 Document Reviewed: 08/22/2010 [facility] Patient Information 2015 [place], [place]. This information is not intended to replace advice given to you by your health care provider. Make sure you discuss any questions you have with your health care provider.

## 2013-09-23 NOTE — Progress Notes (Signed)
BP 124/82  Pulse 68  Temp(Src) 97.9 F (36.6 C) (Oral)  Wt 159 lb 8 oz (72.349 kg)   CC: UTI? Subjective:    Patient ID: Lindsey [name], female    DOB: 1955/05/21, 58 y.o.   MRN: [id number]  HPI: Lindsey [name] is a 58 y.o. female presenting on 09/23/2013 for Urinary Tract Infection   2d h/o urinary symptoms - worse smelling urine, cloudy urine, dysuria with spasms and abdominal pain. Some left flank and nausea.  No fevers/chills, gross hematuria, vomiting.  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 Urologist Dr. Amalia Hailey Last UTI was 3 months ago. Treated by urology with   Relevant past medical, surgical, family and social history reviewed and updated as indicated.  Allergies and medications reviewed and updated. Current Outpatient Prescriptions on File Prior to Visit  Medication Sig  . atenolol (TENORMIN) 100 MG tablet TAKE 1 TABLET DAILY  . baclofen (LIORESAL) 10 MG tablet Take as directed as needed   . Botulinum Toxin Type A (BOTOX) 200 UNITS SOLR Injected as directed for migraine management   . calcipotriene-betamethasone (TACLONEX) ointment Apply topically daily.  . cholecalciferol (VITAMIN D) 1000 UNITS tablet Take 4,000 Units by mouth daily.   . diazepam (VALIUM) 5 MG tablet 1 TAB EVERY PM  . dicyclomine (BENTYL) 20 MG tablet Take one tab before meals  . DULoxetine (CYMBALTA) 60 MG capsule Take 1 capsule by mouth daily.  Marland Kitchen estradiol (ESTRACE) 2 MG tablet Take one and one-half tablets once daily  . fentaNYL (DURAGESIC - DOSED MCG/HR) 50 MCG/HR Place 1 patch onto the skin every other day.    Marland Kitchen HYDROcodone-acetaminophen (LORCET) 10-650 MG per tablet Take 1 tablet by mouth every 6 (six) hours as needed.    . hydrOXYzine (ATARAX) 50 MG tablet Take 50 mg by mouth 2 (two) times daily.    Marland Kitchen lidocaine (LIDODERM) 5 % Place 1 patch onto the skin daily. Remove & Discard patch within 12 hours or as directed by MD   . mupirocin ointment (BACTROBAN) 2 % Apply topically 2 (two) times daily. To affected area  .  pentosan polysulfate (ELMIRON) 100 MG capsule Take two by mouth two times daily   . polyethylene glycol powder (MIRALAX) powder Take 17 g by mouth daily.     No current facility-administered medications on file prior to visit.    Review of Systems Per HPI unless specifically indicated above    Objective:    BP 124/82  Pulse 68  Temp(Src) 97.9 F (36.6 C) (Oral)  Wt 159 lb 8 oz (72.349 kg)  Physical Exam  Nursing note and vitals reviewed. Constitutional: She appears well-developed and well-nourished. No distress.  Abdominal: Soft. Normal appearance and bowel sounds are normal. She exhibits no distension and no mass. There is no hepatosplenomegaly. There is tenderness in the suprapubic area. There is CVA tenderness (L). There is no rigidity, no rebound, no guarding and negative Murphy's sign.  Musculoskeletal: She exhibits no edema.   Results for orders placed in visit on 09/23/13  POCT URINALYSIS DIPSTICK      Result Value Ref Range   Color, UA Yellow     Clarity, UA Cloudy     Glucose, UA Negative     Bilirubin, UA Negative     Ketones, UA Negative     Spec Grav, UA 1.015     Blood, UA Moderate     pH, UA 6.5     Protein, UA Trace     Urobilinogen, UA 0.2  Nitrite, UA Negative     Leukocytes, UA large (3+)        Assessment & Plan:   Problem List Items Addressed This Visit   Acute pyelonephritis - Primary     Concerning systemic sxs of L CVA tenderness and nausea - treat with CTX IM 1gm in office, then 7d course of cipro 500mg  bid. See pt instructions for further plan. Pt education handout provided today. rec push fluids and rest. Update if sxs persist or worsen. Pt agrees with plan.    Relevant Orders      Urine culture    Other Visit Diagnoses   Bad odor of urine        Relevant Orders       POCT Urinalysis Dipstick (Completed)        Follow up plan: Return if symptoms worsen or fail to improve.

## 2013-09-23 NOTE — Assessment & Plan Note (Addendum)
Concerning systemic sxs of L CVA tenderness and nausea - treat with CTX IM 1gm in office, then 7d course of cipro 500mg  bid. See pt instructions for further plan. Pt education handout provided today. rec push fluids and rest. Update if sxs persist or worsen. Pt agrees with plan.

## 2013-09-25 LAB — URINE CULTURE

## 2013-12-16 ENCOUNTER — Telehealth: Payer: Self-pay | Admitting: Family Medicine

## 2013-12-16 DIAGNOSIS — M899 Disorder of bone, unspecified: Secondary | ICD-10-CM

## 2013-12-16 DIAGNOSIS — R7989 Other specified abnormal findings of blood chemistry: Secondary | ICD-10-CM

## 2013-12-16 DIAGNOSIS — E785 Hyperlipidemia, unspecified: Secondary | ICD-10-CM

## 2013-12-16 DIAGNOSIS — E559 Vitamin D deficiency, unspecified: Secondary | ICD-10-CM

## 2013-12-16 DIAGNOSIS — I10 Essential (primary) hypertension: Secondary | ICD-10-CM

## 2013-12-16 DIAGNOSIS — M949 Disorder of cartilage, unspecified: Principal | ICD-10-CM

## 2013-12-16 NOTE — Telephone Encounter (Signed)
Message copied by Abner Greenspan on Thu Dec 16, 2013  5:10 PM ------      Message from: Ellamae Sia      Created: Wed Dec 15, 2013  5:59 PM      Regarding: Lab orders for Friday, 9.18.15       Patient is scheduled for CPX labs, please order future labs, Thanks , Lindsey [name]       ------

## 2013-12-17 ENCOUNTER — Other Ambulatory Visit (INDEPENDENT_AMBULATORY_CARE_PROVIDER_SITE_OTHER): Payer: Medicare Other

## 2013-12-17 DIAGNOSIS — M899 Disorder of bone, unspecified: Secondary | ICD-10-CM

## 2013-12-17 DIAGNOSIS — M949 Disorder of cartilage, unspecified: Secondary | ICD-10-CM

## 2013-12-17 DIAGNOSIS — E785 Hyperlipidemia, unspecified: Secondary | ICD-10-CM

## 2013-12-17 DIAGNOSIS — R7989 Other specified abnormal findings of blood chemistry: Secondary | ICD-10-CM

## 2013-12-17 DIAGNOSIS — R946 Abnormal results of thyroid function studies: Secondary | ICD-10-CM

## 2013-12-17 DIAGNOSIS — E559 Vitamin D deficiency, unspecified: Secondary | ICD-10-CM

## 2013-12-17 DIAGNOSIS — I10 Essential (primary) hypertension: Secondary | ICD-10-CM

## 2013-12-17 LAB — CBC WITH DIFFERENTIAL/PLATELET
Basophils Absolute: 0 10*3/uL (ref 0.0–0.1)
Basophils Relative: 0.3 % (ref 0.0–3.0)
Eosinophils Absolute: 0.1 10*3/uL (ref 0.0–0.7)
Eosinophils Relative: 1.1 % (ref 0.0–5.0)
HEMATOCRIT: 40.3 % (ref 36.0–46.0)
HEMOGLOBIN: 13.4 g/dL (ref 12.0–15.0)
LYMPHS ABS: 3.2 10*3/uL (ref 0.7–4.0)
LYMPHS PCT: 47.8 % — AB (ref 12.0–46.0)
MCHC: 33.1 g/dL (ref 30.0–36.0)
MCV: 88.9 fl (ref 78.0–100.0)
MONOS PCT: 6.7 % (ref 3.0–12.0)
Monocytes Absolute: 0.5 10*3/uL (ref 0.1–1.0)
Neutro Abs: 3 10*3/uL (ref 1.4–7.7)
Neutrophils Relative %: 44.1 % (ref 43.0–77.0)
Platelets: 231 10*3/uL (ref 150.0–400.0)
RBC: 4.54 Mil/uL (ref 3.87–5.11)
RDW: 13.8 % (ref 11.5–15.5)
WBC: 6.8 10*3/uL (ref 4.0–10.5)

## 2013-12-17 LAB — LIPID PANEL
CHOLESTEROL: 201 mg/dL — AB (ref 0–200)
HDL: 45 mg/dL (ref >39.00)
LDL Cholesterol: 142 mg/dL — ABNORMAL HIGH (ref 0–99)
NonHDL: 156
Total CHOL/HDL Ratio: 4
Triglycerides: 69 mg/dL (ref 0.0–149.0)
VLDL: 13.8 mg/dL (ref 0.0–40.0)

## 2013-12-17 LAB — COMPREHENSIVE METABOLIC PANEL
ALT: 35 U/L (ref 0–35)
AST: 21 U/L (ref 0–37)
Albumin: 3.7 g/dL (ref 3.5–5.2)
Alkaline Phosphatase: 48 U/L (ref 39–117)
BILIRUBIN TOTAL: 0.5 mg/dL (ref 0.2–1.2)
BUN: 9 mg/dL (ref 6–23)
CALCIUM: 9.3 mg/dL (ref 8.4–10.5)
CHLORIDE: 103 meq/L (ref 96–112)
CO2: 28 meq/L (ref 19–32)
CREATININE: 0.9 mg/dL (ref 0.4–1.2)
GFR: 67.69 mL/min (ref >60.00)
GLUCOSE: 135 mg/dL — AB (ref 70–99)
Potassium: 4.6 mEq/L (ref 3.5–5.1)
Sodium: 139 mEq/L (ref 135–145)
Total Protein: 7.2 g/dL (ref 6.0–8.3)

## 2013-12-17 LAB — TSH: TSH: 1.38 u[IU]/mL (ref 0.35–4.50)

## 2013-12-17 LAB — VITAMIN D 25 HYDROXY (VIT D DEFICIENCY, FRACTURES): VITD: 52.36 ng/mL (ref 30.00–100.00)

## 2013-12-24 ENCOUNTER — Encounter: Payer: Self-pay | Admitting: Family Medicine

## 2013-12-24 ENCOUNTER — Ambulatory Visit (INDEPENDENT_AMBULATORY_CARE_PROVIDER_SITE_OTHER): Payer: Medicare Other | Admitting: Family Medicine

## 2013-12-24 VITALS — BP 126/68 | HR 63 | Temp 98.5°F | Ht 63.0 in | Wt 162.5 lb

## 2013-12-24 DIAGNOSIS — R739 Hyperglycemia, unspecified: Secondary | ICD-10-CM

## 2013-12-24 DIAGNOSIS — E785 Hyperlipidemia, unspecified: Secondary | ICD-10-CM

## 2013-12-24 DIAGNOSIS — R7309 Other abnormal glucose: Secondary | ICD-10-CM

## 2013-12-24 DIAGNOSIS — Z23 Encounter for immunization: Secondary | ICD-10-CM

## 2013-12-24 DIAGNOSIS — E559 Vitamin D deficiency, unspecified: Secondary | ICD-10-CM

## 2013-12-24 DIAGNOSIS — I10 Essential (primary) hypertension: Secondary | ICD-10-CM

## 2013-12-24 DIAGNOSIS — M899 Disorder of bone, unspecified: Secondary | ICD-10-CM

## 2013-12-24 DIAGNOSIS — Z0000 Encounter for general adult medical examination without abnormal findings: Secondary | ICD-10-CM

## 2013-12-24 DIAGNOSIS — M949 Disorder of cartilage, unspecified: Secondary | ICD-10-CM

## 2013-12-24 HISTORY — DX: Hyperglycemia, unspecified: R73.9

## 2013-12-24 NOTE — Assessment & Plan Note (Signed)
dexa 4/15 Mild osteopenia  Watched by Dr Garen Grams Disc ca and D Disc need for calcium/ vitamin D/ wt bearing exercise and bone density test every 2 y to monitor Disc safety/ fracture risk in detail   D level is good

## 2013-12-24 NOTE — Progress Notes (Signed)
Pre visit review using our clinic review tool, if applicable. No additional management support is needed unless otherwise documented below in the visit note. 

## 2013-12-24 NOTE — Assessment & Plan Note (Signed)
Vitamin D level is therapeutic with current supplementation Disc importance of this to bone and overall health  

## 2013-12-24 NOTE — Assessment & Plan Note (Signed)
Fasting gluc 135 Disc low glycemic diet  Limited activity unfortunately  F/u 3 mo with lab/ A1C Disc risk of dm and need for wt loss

## 2013-12-24 NOTE — Patient Instructions (Signed)
Flu vaccine today  Think about working on an advanced directive  Work on low sugar and low fat diet  Weight loss would help prevent diabetes   Schedule fasting labs in 3 months and then follow up     Fat and Cholesterol Control Diet Fat and cholesterol levels in your blood and organs are influenced by your diet. High levels of fat and cholesterol may lead to diseases of the heart, small and large blood vessels, gallbladder, liver, and pancreas. CONTROLLING FAT AND CHOLESTEROL WITH DIET Although exercise and lifestyle factors are important, your diet is key. That is because certain foods are known to raise cholesterol and others to lower it. The goal is to balance foods for their effect on cholesterol and more importantly, to replace saturated and trans fat with other types of fat, such as monounsaturated fat, polyunsaturated fat, and omega-3 fatty acids. On average, a person should consume no more than 15 to 17 g of saturated fat daily. Saturated and trans fats are considered "bad" fats, and they will raise LDL cholesterol. Saturated fats are primarily found in animal products such as meats, butter, and cream. However, that does not mean you need to give up all your favorite foods. Today, there are good tasting, low-fat, low-cholesterol substitutes for most of the things you like to eat. Choose low-fat or nonfat alternatives. Choose round or loin cuts of red meat. These types of cuts are lowest in fat and cholesterol. Chicken (without the skin), fish, veal, and ground [place] breast are great choices. Eliminate fatty meats, such as hot dogs and salami. Even shellfish have little or no saturated fat. Have a 3 oz (85 g) portion when you eat lean meat, poultry, or fish. Trans fats are also called "partially hydrogenated oils." They are oils that have been scientifically manipulated so that they are solid at room temperature resulting in a longer shelf life and improved taste and texture of foods in which  they are added. Trans fats are found in stick margarine, some tub margarines, cookies, crackers, and baked goods.  When baking and cooking, oils are a great substitute for butter. The monounsaturated oils are especially beneficial since it is believed they lower LDL and raise HDL. The oils you should avoid entirely are saturated tropical oils, such as coconut and palm.  Remember to eat a lot from food groups that are naturally free of saturated and trans fat, including fish, fruit, vegetables, beans, grains (barley, rice, couscous, bulgur wheat), and pasta (without cream sauces).  IDENTIFYING FOODS THAT LOWER FAT AND CHOLESTEROL  Soluble fiber may lower your cholesterol. This type of fiber is found in fruits such as apples, vegetables such as broccoli, potatoes, and carrots, legumes such as beans, peas, and lentils, and grains such as barley. Foods fortified with plant sterols (phytosterol) may also lower cholesterol. You should eat at least 2 g per day of these foods for a cholesterol lowering effect.  Read package labels to identify low-saturated fats, trans fat free, and low-fat foods at the supermarket. Select cheeses that have only 2 to 3 g saturated fat per ounce. Use a heart-healthy tub margarine that is free of trans fats or partially hydrogenated oil. When buying baked goods (cookies, crackers), avoid partially hydrogenated oils. Breads and muffins should be made from whole grains (whole-wheat or whole oat flour, instead of "flour" or "enriched flour"). Buy non-creamy canned soups with reduced salt and no added fats.  FOOD PREPARATION TECHNIQUES  Never deep-fry. If you must fry, either  stir-fry, which uses very little fat, or use non-stick cooking sprays. When possible, broil, bake, or roast meats, and steam vegetables. Instead of putting butter or margarine on vegetables, use lemon and herbs, applesauce, and cinnamon (for squash and sweet potatoes). Use nonfat yogurt, salsa, and low-fat dressings  for salads.  LOW-SATURATED FAT / LOW-FAT FOOD SUBSTITUTES Meats / Saturated Fat (g)  Avoid: Steak, marbled (3 oz/85 g) / 11 g  Choose: Steak, lean (3 oz/85 g) / 4 g  Avoid: Hamburger (3 oz/85 g) / 7 g  Choose: Hamburger, lean (3 oz/85 g) / 5 g  Avoid: Ham (3 oz/85 g) / 6 g  Choose: Ham, lean cut (3 oz/85 g) / 2.4 g  Avoid: Chicken, with skin, dark meat (3 oz/85 g) / 4 g  Choose: Chicken, skin removed, dark meat (3 oz/85 g) / 2 g  Avoid: Chicken, with skin, light meat (3 oz/85 g) / 2.5 g  Choose: Chicken, skin removed, light meat (3 oz/85 g) / 1 g Dairy / Saturated Fat (g)  Avoid: Whole milk (1 cup) / 5 g  Choose: Low-fat milk, 2% (1 cup) / 3 g  Choose: Low-fat milk, 1% (1 cup) / 1.5 g  Choose: Skim milk (1 cup) / 0.3 g  Avoid: Hard cheese (1 oz/28 g) / 6 g  Choose: Skim milk cheese (1 oz/28 g) / 2 to 3 g  Avoid: Cottage cheese, 4% fat (1 cup) / 6.5 g  Choose: Low-fat cottage cheese, 1% fat (1 cup) / 1.5 g  Avoid: Ice cream (1 cup) / 9 g  Choose: Sherbet (1 cup) / 2.5 g  Choose: Nonfat frozen yogurt (1 cup) / 0.3 g  Choose: Frozen fruit bar / trace  Avoid: Whipped cream (1 tbs) / 3.5 g  Choose: Nondairy whipped topping (1 tbs) / 1 g Condiments / Saturated Fat (g)  Avoid: Mayonnaise (1 tbs) / 2 g  Choose: Low-fat mayonnaise (1 tbs) / 1 g  Avoid: Butter (1 tbs) / 7 g  Choose: Extra light margarine (1 tbs) / 1 g  Avoid: Coconut oil (1 tbs) / 11.8 g  Choose: Olive oil (1 tbs) / 1.8 g  Choose: Corn oil (1 tbs) / 1.7 g  Choose: Safflower oil (1 tbs) / 1.2 g  Choose: Sunflower oil (1 tbs) / 1.4 g  Choose: Soybean oil (1 tbs) / 2.4 g  Choose: Canola oil (1 tbs) / 1 g Document Released: 03/18/2005 Document Revised: 07/13/2012 Document Reviewed: 06/16/2013 [employer] Patient Information 2015 [place], [place]. This information is not intended to replace advice given to you by your health care provider. Make sure you discuss any questions you have with your  health care provider.

## 2013-12-24 NOTE — Assessment & Plan Note (Signed)
Disc goals for lipids and reasons to control them Rev labs with pt Rev low sat fat diet in detail Up wit worse diet Will work on this  Re check 3 mo and f/u Intol to statins

## 2013-12-24 NOTE — Assessment & Plan Note (Signed)
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

## 2013-12-24 NOTE — Progress Notes (Signed)
Subjective:    Patient ID: Lindsey [name], female    DOB: 26-Aug-1955, 58 y.o.   MRN: [id number]  HPI I have personally reviewed the Medicare Annual Wellness questionnaire and have noted 1. The patient's medical and social history 2. Their use of alcohol, tobacco or illicit drugs 3. Their current medications and supplements 4. The patient's functional ability including ADL's, fall risks, home safety risks and hearing or visual             impairment. 5. Diet and physical activities 6. Evidence for depression or mood disorders  The patients weight, height, BMI have been recorded in the chart and visual acuity is per eye clinic.  I have made referrals, counseling and provided education to the patient based review of the above and I have provided the pt with a written personalized care plan for preventive services.  See scanned forms.  Routine anticipatory guidance given to patient.  See health maintenance. Colon cancer screening 5/12 - 10 year recall  Breast cancer screening 4/15 nl  Self breast exam no lumps  Had hysterectomy in the past  Flu vaccine will do today  Tetanus vaccine 1/09 Pneumovax 6/14  Zoster vaccine -not paid for yet   Advance directive -does not have  Cognitive function addressed- see scanned forms- and if abnormal then additional documentation follows. - she has the "fibro fog"- continued  Nothing new   PMH and SH reviewed  Meds, vitals, and allergies reviewed.   ROS: See HPI.  Otherwise negative.    bp is stable today  No cp or palpitations or headaches or edema  No side effects to medicines  BP Readings from Last 3 Encounters:  12/24/13 126/68  09/23/13 124/82  12/04/12 122/84     Wt is up 3 lb with bmi of 28  Cannot get much exercise due to her fibro and chronic pain  She is in bed much of the time Eats what people fix for her   Labs  Blood sugar 135  Fasting  Does have DM in family  She does eat too much ice cream   dexa 4/15 - Dr  Garen Grams Osteopenia - is watching  She takes vit D and needs to start back on Ca D level was good at 52   Hyperlipidemia Lab Results  Component Value Date   CHOL 201* 12/17/2013   CHOL 228* 11/27/2012   CHOL 242* 07/24/2012   Lab Results  Component Value Date   HDL 45.00 12/17/2013   HDL 78.70 11/27/2012   HDL 68.50 07/24/2012   Lab Results  Component Value Date   LDLCALC 142* 12/17/2013   LDLCALC 93 01/17/2010   LDLCALC 153* 05/06/2008   Lab Results  Component Value Date   TRIG 69.0 12/17/2013   TRIG 121.0 11/27/2012   TRIG 104.0 07/24/2012   Lab Results  Component Value Date   CHOLHDL 4 12/17/2013   CHOLHDL 3 11/27/2012   CHOLHDL 4 07/24/2012   Lab Results  Component Value Date   LDLDIRECT 137.4 11/27/2012   LDLDIRECT 166.1 07/24/2012   LDLDIRECT 161.5 07/03/2011   has not been on cholesterol medicine recently  Once she took med ? Statin - made her sick years ago -tried 2-3 different ones   Patient Active Problem List   Diagnosis Date Noted  . Acute pyelonephritis 09/23/2013  . Encounter for Medicare annual wellness exam 09/04/2012  . Low TSH level 09/04/2012  . Skin lesion 08/19/2012  . HSV (herpes simplex virus) infection  04/08/2012  . IBS (irritable bowel syndrome) 10/21/2011  . Routine general medical examination at a health care facility 07/02/2011  . OSTEOPENIA 05/17/2009  . DEPRESSION 10/05/2008  . GERD 05/05/2008  . OBSTRUCTIVE SLEEP APNEA 06/25/2007  . HYPERLIPIDEMIA 05/31/2007  . HYPERTENSION 05/31/2007  . SOMNOLENCE 04/23/2007  . DEFICIENCY, VITAMIN D NOS 01/22/2007  . MIGRAINE HEADACHE 01/22/2007  . CYSTITIS, CHRONIC INTERSTITIAL 01/22/2007  . MENOPAUSE-RELATED VASOMOTOR SYMPTOMS 01/22/2007  . OSTEOARTHROSIS, GENERALIZED, MULTIPLE SITES 01/22/2007  . FIBROMYALGIA 01/22/2007  . PATENT FORAMEN OVALE 01/22/2007   Past Medical History  Diagnosis Date  . Interstitial cystitis   . Pelvic floor dysfunction   . Arthritis   . Vitamin D deficiency   .  Chronic fatigue   . Tendonitis     hand  . Migraines   . PFO (patent foramen ovale)   . Fibromyalgia   . Hyperlipidemia   . Hypertension   . Hypersomnia   . IBS (irritable bowel syndrome)   . Hemorrhoids   . Osteopenia    Past Surgical History  Procedure Laterality Date  . Fracture surgery      ankle fracture x 2  . Breast lumpectomy      many, bilateral  . Vaginal hysterectomy  1990    because of the IC partial  . Nasal sinus surgery      multiple, x4  . Bladder surgery      and hydrodistension  . Cervical discectomy  2006    fusion and plating  . Colonoscopy    . Upper gastrointestinal endoscopy     History  Substance Use Topics  . Smoking status: Never Smoker   . Smokeless tobacco: Never Used  . Alcohol Use: No   Family History  Problem Relation Age of Onset  . Fibromyalgia Mother   . Diabetes Father   . Alcohol abuse Father   . Colon cancer Paternal Uncle   . Stomach cancer Paternal Grandfather   . Diabetes Paternal Grandmother   . Diabetes Paternal Grandfather   . Heart disease Father   . Stroke Paternal Grandmother    Allergies  Allergen Reactions  . Buspirone Hcl     REACTION: dystonia  . Simvastatin     REACTION: malaise  . Sulfa Antibiotics     nausea   Current Outpatient Prescriptions on File Prior to Visit  Medication Sig Dispense Refill  . atenolol (TENORMIN) 100 MG tablet TAKE 1 TABLET DAILY  90 tablet  1  . baclofen (LIORESAL) 10 MG tablet Take as directed as needed       . Botulinum Toxin Type A (BOTOX) 200 UNITS SOLR Injected as directed for migraine management       . cholecalciferol (VITAMIN D) 1000 UNITS tablet Take 4,000 Units by mouth daily.       . diazepam (VALIUM) 5 MG tablet 1 TAB EVERY PM      . dicyclomine (BENTYL) 20 MG tablet Take one tab before meals  90 tablet  5  . DULoxetine (CYMBALTA) 60 MG capsule Take 1 capsule by mouth daily.      . fentaNYL (DURAGESIC - DOSED MCG/HR) 50 MCG/HR Place 1 patch onto the skin every  other day.        Marland Kitchen HYDROcodone-acetaminophen (LORCET) 10-650 MG per tablet Take 1 tablet by mouth every 6 (six) hours as needed.        . hydrOXYzine (ATARAX) 50 MG tablet Take 50 mg by mouth 2 (two) times daily.        Marland Kitchen  lidocaine (LIDODERM) 5 % Place 1 patch onto the skin daily. Remove & Discard patch within 12 hours or as directed by MD       . pentosan polysulfate (ELMIRON) 100 MG capsule Take two by mouth two times daily       . polyethylene glycol powder (MIRALAX) powder Take 17 g by mouth daily.         No current facility-administered medications on file prior to visit.         Review of Systems    Review of Systems  Constitutional: Negative for fever, appetite change, fatigue and unexpected weight change.  Eyes: Negative for pain and visual disturbance.  Respiratory: Negative for cough and shortness of breath.   Cardiovascular: Negative for cp or palpitations    Gastrointestinal: Negative for nausea, diarrhea and constipation.  Genitourinary: pos for chronic urgency/frequency/dysuria   Skin: Negative for pallor or rash   MSK pos for myofacial pain and sensitivity  Neurological: Negative for weakness, light-headedness, numbness and headaches.  Hematological: Negative for adenopathy. Does not bruise/bleed easily.  Psychiatric/Behavioral: Negative for dysphoric mood. The patient is nervous/anxious.      Objective:   Physical Exam  Constitutional: She appears well-developed and well-nourished. No distress.  HENT:  Head: Normocephalic and atraumatic.  Right Ear: External ear normal.  Left Ear: External ear normal.  Mouth/Throat: Oropharynx is clear and moist.  Eyes: Conjunctivae and EOM are normal. Pupils are equal, round, and reactive to light. No scleral icterus.  Neck: Normal range of motion. Neck supple. No JVD present. Carotid bruit is not present. No thyromegaly present.  Cardiovascular: Normal rate, regular rhythm and intact distal pulses.  Exam reveals no gallop.    Murmur heard. Pulmonary/Chest: Effort normal and breath sounds normal. No respiratory distress. She has no wheezes. She exhibits no tenderness.  Abdominal: Soft. Bowel sounds are normal. She exhibits no distension, no abdominal bruit and no mass. There is no tenderness.  Genitourinary: No breast swelling, tenderness, discharge or bleeding.  Breast exam: No mass, nodules, thickening, tenderness, bulging, retraction, inflamation, nipple discharge or skin changes noted.  No axillary or clavicular LA.      Musculoskeletal: Normal range of motion. She exhibits tenderness. She exhibits no edema.  Pt is diffusely tender in almost all myofascial areas and even has trouble changing positions on the exam table   Lymphadenopathy:    She has no cervical adenopathy.  Neurological: She is alert. She has normal reflexes. No cranial nerve deficit. She exhibits normal muscle tone. Coordination normal.  Skin: Skin is warm and dry. No rash noted. No erythema. No pallor.  Psychiatric: Thought content normal. Her mood appears not anxious. Her affect is not labile. Her speech is delayed. She is slowed. Thought content is not paranoid. She exhibits a depressed mood. She expresses no homicidal and no suicidal ideation.          Assessment & Plan:   Problem List Items Addressed This Visit     Cardiovascular and Mediastinum   HYPERTENSION      bp in fair control at this time  BP Readings from Last 1 Encounters:  12/24/13 126/68   No changes needed Disc lifstyle change with low sodium diet and exercise  Labs rev      Musculoskeletal and Integument   OSTEOPENIA     dexa 4/15 Mild osteopenia  Watched by Dr Garen Grams Disc ca and D Disc need for calcium/ vitamin D/ wt bearing exercise and bone density test every 2  y to monitor Disc safety/ fracture risk in detail   D level is good       Other   DEFICIENCY, VITAMIN D NOS     Vitamin D level is therapeutic with current supplementation Disc  importance of this to bone and overall health     HYPERLIPIDEMIA     Disc goals for lipids and reasons to control them Rev labs with pt Rev low sat fat diet in detail Up wit worse diet Will work on this  Re check 3 mo and f/u Intol to statins     Encounter for Medicare annual wellness exam - Primary     Reviewed health habits including diet and exercise and skin cancer prevention Reviewed appropriate screening tests for age  Also reviewed health mt list, fam hx and immunization status , as well as social and family history   See HPI 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

## 2013-12-24 NOTE — Assessment & Plan Note (Signed)
bp in fair control at this time  BP Readings from Last 1 Encounters:  12/24/13 126/68   No changes needed Disc lifstyle change with low sodium diet and exercise  Labs rev

## 2014-01-07 ENCOUNTER — Ambulatory Visit (INDEPENDENT_AMBULATORY_CARE_PROVIDER_SITE_OTHER): Payer: Medicare Other | Admitting: Internal Medicine

## 2014-01-07 ENCOUNTER — Encounter: Payer: Self-pay | Admitting: Internal Medicine

## 2014-01-07 VITALS — BP 124/86 | HR 67 | Temp 97.9°F | Wt 156.0 lb

## 2014-01-07 DIAGNOSIS — N10 Acute tubulo-interstitial nephritis: Secondary | ICD-10-CM

## 2014-01-07 DIAGNOSIS — R1110 Vomiting, unspecified: Secondary | ICD-10-CM

## 2014-01-07 DIAGNOSIS — R300 Dysuria: Secondary | ICD-10-CM

## 2014-01-07 LAB — POCT URINALYSIS DIPSTICK
Bilirubin, UA: NEGATIVE
Glucose, UA: NEGATIVE
KETONES UA: NEGATIVE
PH UA: 6
Spec Grav, UA: 1.01
Urobilinogen, UA: NEGATIVE

## 2014-01-07 MED ORDER — LEVOFLOXACIN 500 MG PO TABS: 500.0000 mg | ORAL_TABLET | Freq: Every day | ORAL | Status: DC

## 2014-01-07 MED ORDER — ONDANSETRON HCL 4 MG PO TABS: 4.0000 mg | ORAL_TABLET | Freq: Three times a day (TID) | ORAL | Status: DC | PRN

## 2014-01-07 MED ORDER — CEFTRIAXONE SODIUM 1 G IJ SOLR
1.0000 g | Freq: Once | INTRAMUSCULAR | Status: AC
  Administered 2014-01-07: 1 g via INTRAMUSCULAR

## 2014-01-07 NOTE — Progress Notes (Signed)
HPI  Pt presents to the clinic today with c/o urinary symptoms. Symptoms began 3 days ago with fever, flank pain, difficulty urinating, and nausea. She has tried to increase her fluids, but symptoms have only persisted. She has had pyelonephritis this year and feel this could be similar. She does have a history of interstitial cystitis and follows urology.    Review of Systems  Past Medical History  Diagnosis Date  . Interstitial cystitis   . Pelvic floor dysfunction   . Arthritis   . Vitamin D deficiency   . Chronic fatigue   . Tendonitis     hand  . Migraines   . PFO (patent foramen ovale)   . Fibromyalgia   . Hyperlipidemia   . Hypertension   . Hypersomnia   . IBS (irritable bowel syndrome)   . Hemorrhoids   . Osteopenia     Family History  Problem Relation Age of Onset  . Fibromyalgia Mother   . Diabetes Father   . Alcohol abuse Father   . Colon cancer Paternal Uncle   . Stomach cancer Paternal Grandfather   . Diabetes Paternal Grandmother   . Diabetes Paternal Grandfather   . Heart disease Father   . Stroke Paternal Grandmother     History   Social History  . Marital Status: Married    Spouse Name: N/A    Number of Children: 0  . Years of Education: N/A   Occupational History  . Disabled from [place] Topics  . Smoking status: Never Smoker   . Smokeless tobacco: Never Used  . Alcohol Use: No  . Drug Use: No  . Sexual Activity: Not on file   Other Topics Concern  . Not on file   Social History Narrative  . No narrative on file    Allergies  Allergen Reactions  . Buspirone Hcl     REACTION: dystonia  . Simvastatin     REACTION: malaise  . Sulfa Antibiotics     nausea    Constitutional: Positive fever, malaise, and fatigue. Denies headache or abrupt weight changes.   GU: Pt reports urgency, frequency and pain with urination. Denies burning sensation, blood in urine, odor or discharge. Skin: Denies redness, rashes, lesions  or ulcercations.   No other specific complaints in a complete review of systems (except as listed in HPI above).    Objective:   Physical Exam  Wt 156 lb (70.761 kg) Wt Readings from Last 3 Encounters:  01/07/14 156 lb (70.761 kg)  12/24/13 162 lb 8 oz (73.71 kg)  09/23/13 159 lb 8 oz (72.349 kg)    General: Appears her stated age, well developed, well nourished in NAD. Cardiovascular: Normal rate and rhythm. S1,S2 noted.  No murmur, rubs or gallops noted. No JVD or BLE edema. No carotid bruits noted. Pulmonary/Chest: Normal effort and positive vesicular breath sounds. No respiratory distress. No wheezes, rales or ronchi noted.  Abdomen: Soft and nontender. Normal bowel sounds, no bruits noted. No distention or masses noted. Liver, spleen and kidneys non palpable. Tender to palpation over the bladder area. CVA tenderness L>R.      Assessment & Plan:   Urgency, Frequency, Dysuria secondary to   Urinalysis:positive for leukocytes, nitrates, blood and protein Suspect Pylonephritis eRx for Levaquin 750mg  PO daily for 7 days IM injection of Rocephin 1g given in office Drink plenty of fluids  RTC as needed or if symptoms persist. ER precaution for further worsening of symptoms  Westley Foots, Student-NP

## 2014-01-07 NOTE — Patient Instructions (Addendum)
Pyelonephritis, Adult °Pyelonephritis is a kidney infection. In general, there are 2 main types of pyelonephritis: °· Infections that come on quickly without any warning (acute pyelonephritis). °· Infections that persist for a long period of time (chronic pyelonephritis). °CAUSES  °Two main causes of pyelonephritis are: °· Bacteria traveling from the bladder to the kidney. This is a problem especially in pregnant women. The urine in the bladder can become filled with bacteria from multiple causes, including: °¨ Inflammation of the prostate gland (prostatitis). °¨ Sexual intercourse in females. °¨ Bladder infection (cystitis). °· Bacteria traveling from the bloodstream to the tissue part of the kidney. °Problems that may increase your risk of getting a kidney infection include: °· Diabetes. °· Kidney stones or bladder stones. °· Cancer. °· Catheters placed in the bladder. °· Other abnormalities of the kidney or ureter. °SYMPTOMS  °· Abdominal pain. °· Pain in the side or flank area. °· Fever. °· Chills. °· Upset stomach. °· Blood in the urine (dark urine). °· Frequent urination. °· Strong or persistent urge to urinate. °· Burning or stinging when urinating. °DIAGNOSIS  °Your caregiver may diagnose your kidney infection based on your symptoms. A urine sample may also be taken. °TREATMENT  °In general, treatment depends on how severe the infection is.  °· If the infection is mild and caught early, your caregiver may treat you with oral antibiotics and send you home. °· If the infection is more severe, the bacteria may have gotten into the bloodstream. This will require intravenous (IV) antibiotics and a hospital stay. Symptoms may include: °¨ High fever. °¨ Severe flank pain. °¨ Shaking chills. °· Even after a hospital stay, your caregiver may require you to be on oral antibiotics for a period of time. °· Other treatments may be required depending upon the cause of the infection. °HOME CARE INSTRUCTIONS  °· Take your  antibiotics as directed. Finish them even if you start to feel better. °· Make an appointment to have your urine checked to make sure the infection is gone. °· Drink enough fluids to keep your urine clear or pale yellow. °· Take medicines for the bladder if you have urgency and frequency of urination as directed by your caregiver. °SEEK IMMEDIATE MEDICAL CARE IF:  °· You have a fever or persistent symptoms for more than 2-3 days. °· You have a fever and your symptoms suddenly get worse. °· You are unable to take your antibiotics or fluids. °· You develop shaking chills. °· You experience extreme weakness or fainting. °· There is no improvement after 2 days of treatment. °MAKE SURE YOU: °· Understand these instructions. °· Will watch your condition. °· Will get help right away if you are not doing well or get worse. °Document Released: 03/18/2005 Document Revised: 09/17/2011 Document Reviewed: 08/22/2010 °Exit[employer]® Patient Information ©2015 [employer]. This information is not intended to replace advice given to you by your health care provider. Make sure you discuss any questions you have with your health care provider. ° °

## 2014-01-07 NOTE — Progress Notes (Signed)
HPI  Pt presents to the clinic today with c/o dysuria, back pain, fever, chills and nausea. She reports this started 3 days ago. The pain in his her lower back and flanks. She has been trying to increase her water intake. She did feel a little better yesterday but symptoms have worsened today. She does have a history of interstitial cystitis. She did have a episode of pyelonephritis 08/2013.   Review of Systems  Past Medical History  Diagnosis Date  . Interstitial cystitis   . Pelvic floor dysfunction   . Arthritis   . Vitamin D deficiency   . Chronic fatigue   . Tendonitis     hand  . Migraines   . PFO (patent foramen ovale)   . Fibromyalgia   . Hyperlipidemia   . Hypertension   . Hypersomnia   . IBS (irritable bowel syndrome)   . Hemorrhoids   . Osteopenia     Family History  Problem Relation Age of Onset  . Fibromyalgia Mother   . Diabetes Father   . Alcohol abuse Father   . Colon cancer Paternal Uncle   . Stomach cancer Paternal Grandfather   . Diabetes Paternal Grandmother   . Diabetes Paternal Grandfather   . Heart disease Father   . Stroke Paternal Grandmother     History   Social History  . Marital Status: Married    Spouse Name: N/A    Number of Children: 0  . Years of Education: N/A   Occupational History  . Disabled from [place] Topics  . Smoking status: Never Smoker   . Smokeless tobacco: Never Used  . Alcohol Use: No  . Drug Use: No  . Sexual Activity: Not on file   Other Topics Concern  . Not on file   Social History Narrative  . No narrative on file    Allergies  Allergen Reactions  . Buspirone Hcl     REACTION: dystonia  . Simvastatin     REACTION: malaise  . Sulfa Antibiotics     nausea    Constitutional: Denies fever, malaise, fatigue, headache or abrupt weight changes.   GU: Pt reports urgency, frequency and pain with urination. Denies burning sensation, blood in urine, odor or discharge. Skin: Denies  redness, rashes, lesions or ulcercations.   No other specific complaints in a complete review of systems (except as listed in HPI above).    Objective:   Physical Exam  BP 124/86  Pulse 67  Temp(Src) 97.9 F (36.6 C) (Oral)  Wt 156 lb (70.761 kg)  SpO2 98%  Wt Readings from Last 3 Encounters:  01/07/14 156 lb (70.761 kg)  12/24/13 162 lb 8 oz (73.71 kg)  09/23/13 159 lb 8 oz (72.349 kg)    General: Appears her stated age, well developed, well nourished in NAD. Cardiovascular: Normal rate and rhythm. S1,S2 noted.  No murmur, rubs or gallops noted. No JVD or BLE edema. No carotid bruits noted. Pulmonary/Chest: Normal effort and positive vesicular breath sounds. No respiratory distress. No wheezes, rales or ronchi noted.  Abdomen: Soft. Normal bowel sounds, no bruits noted. No distention or masses noted. Liver, spleen and kidneys non palpable. Tender to palpation over the bladder area. Moderate CVA tenderness.      Assessment & Plan:   Urgency, Frequency, Dysuria secondary to UTI, concerning for pyelonephritis  Urinalysis: mod leuks, pos nitrities, pos protein, pos blood Will send urine culture eRx sent if for Levaquin daily x 7  days eRx for Zofran TID prn nausea 1 gm Rocephin IM today OK to take AZO OTC Drink plenty of fluids  RTC as needed or if symptoms persist.

## 2014-01-07 NOTE — Addendum Note (Signed)
Addended by: Lurlean Nanny on: 01/07/2014 03:30 PM   Modules accepted: Orders

## 2014-01-07 NOTE — Progress Notes (Signed)
Pre visit review using our clinic review tool, if applicable. No additional management support is needed unless otherwise documented below in the visit note. 

## 2014-01-10 LAB — URINE CULTURE: Colony Count: >=100000

## 2014-02-02 ENCOUNTER — Telehealth: Payer: Self-pay | Admitting: *Deleted

## 2014-02-02 NOTE — Telephone Encounter (Signed)
Pt walked in wanting to drop off urine that her urologist Dr.Robert Amalia Hailey advised her to do and they would send in order to have urine dipped and cultured. I advsied with patient that we don't do orders like that, she would need to be seen. Also pt was just seen by Webb Silversmith 01/07/14 and had urinalyisis and culture done. Pt states she has recurrent bladder and kidney infections and she just wanted to be sure. I also advised she may want to follow-up with her urologist and that I would also send note to Dr. Glori Bickers.

## 2014-02-02 NOTE — Telephone Encounter (Signed)
Aware- we cannot do labs for other doctors - but make an appt if she thinks she needs to be seen for an infection

## 2014-02-02 NOTE — Telephone Encounter (Signed)
Pt notified of Dr. Marliss Coots comments. Pt dose want to be seen tomorrow, Dr. Glori Bickers out of the office on Thursdays so appt scheduled with [facility]

## 2014-02-03 ENCOUNTER — Encounter: Payer: Self-pay | Admitting: Internal Medicine

## 2014-02-03 ENCOUNTER — Ambulatory Visit (INDEPENDENT_AMBULATORY_CARE_PROVIDER_SITE_OTHER): Payer: Medicare Other | Admitting: Internal Medicine

## 2014-02-03 VITALS — BP 108/66 | HR 75 | Temp 98.0°F | Wt 157.0 lb

## 2014-02-03 DIAGNOSIS — R319 Hematuria, unspecified: Secondary | ICD-10-CM

## 2014-02-03 DIAGNOSIS — N390 Urinary tract infection, site not specified: Secondary | ICD-10-CM

## 2014-02-03 DIAGNOSIS — R300 Dysuria: Secondary | ICD-10-CM

## 2014-02-03 LAB — POCT URINALYSIS DIPSTICK
Bilirubin, UA: NEGATIVE
Glucose, UA: NEGATIVE
KETONES UA: NEGATIVE
PH UA: 6.5
Spec Grav, UA: <=1.005
Urobilinogen, UA: NEGATIVE

## 2014-02-03 MED ORDER — CIPROFLOXACIN HCL 500 MG PO TABS: 500.0000 mg | ORAL_TABLET | Freq: Two times a day (BID) | ORAL | Status: DC

## 2014-02-03 NOTE — Progress Notes (Signed)
Pre visit review using our clinic review tool, if applicable. No additional management support is needed unless otherwise documented below in the visit note. 

## 2014-02-03 NOTE — Addendum Note (Signed)
Addended by: Lurlean Nanny on: 02/03/2014 02:14 PM   Modules accepted: Orders

## 2014-02-03 NOTE — Progress Notes (Signed)
HPI  Pt presents to the clinic today with c/o dysuria, blood in urine, nausea and low back pain. She reports this started 2 days ago. She has not started running fevers. She has increased her fluids. She reports that everytime she has sexual intercourse, she ends up with a UTI. She does see urology and has a follow up appt in 03/2014.   Review of Systems  Past Medical History  Diagnosis Date  . Interstitial cystitis   . Pelvic floor dysfunction   . Arthritis   . Vitamin D deficiency   . Chronic fatigue   . Tendonitis     hand  . Migraines   . PFO (patent foramen ovale)   . Fibromyalgia   . Hyperlipidemia   . Hypertension   . Hypersomnia   . IBS (irritable bowel syndrome)   . Hemorrhoids   . Osteopenia     Family History  Problem Relation Age of Onset  . Fibromyalgia Mother   . Diabetes Father   . Alcohol abuse Father   . Colon cancer Paternal Uncle   . Stomach cancer Paternal Grandfather   . Diabetes Paternal Grandmother   . Diabetes Paternal Grandfather   . Heart disease Father   . Stroke Paternal Grandmother     History   Social History  . Marital Status: Married    Spouse Name: N/A    Number of Children: 0  . Years of Education: N/A   Occupational History  . Disabled from [place] Topics  . Smoking status: Never Smoker   . Smokeless tobacco: Never Used  . Alcohol Use: No  . Drug Use: No  . Sexual Activity: Not on file   Other Topics Concern  . Not on file   Social History Narrative    Allergies  Allergen Reactions  . Buspirone Hcl     REACTION: dystonia  . Simvastatin     REACTION: malaise  . Sulfa Antibiotics     nausea    Constitutional: Denies fever, malaise, fatigue, headache or abrupt weight changes.   GU: Pt reports urgency, frequency and pain with urination. Denies burning sensation, blood in urine, odor or discharge. Skin: Denies redness, rashes, lesions or ulcercations.   No other specific complaints in a  complete review of systems (except as listed in HPI above).    Objective:   Physical Exam  BP 108/66 mmHg  Pulse 75  Temp(Src) 98 F (36.7 C) (Oral)  Wt 157 lb (71.215 kg)  SpO2 99% Wt Readings from Last 3 Encounters:  02/03/14 157 lb (71.215 kg)  01/07/14 156 lb (70.761 kg)  12/24/13 162 lb 8 oz (73.71 kg)    General: Appears her stated age, well developed, well nourished in NAD. Cardiovascular: Normal rate and rhythm. S1,S2 noted.  No murmur, rubs or gallops noted.  Pulmonary/Chest: Normal effort and positive vesicular breath sounds. No respiratory distress. No wheezes, rales or ronchi noted.  Abdomen: Soft and tender to palpation over the bladder. Normal bowel sounds, no bruits noted. No distention or masses noted. Liver, spleen and kidneys non palpable. No CVA tenderness.      Assessment & Plan:   Urgency, Frequency, Dysuria secondary to  UTI:  Urinalysis: 3+ leuks, pos nitrites, 3+ blood Will send urine culture eRx sent if for Cipro 500 mg BID x 10 days OK to take AZO OTC Drink plenty of fluids Follow up with urology in 03/2014  RTC as needed or if symptoms persist.

## 2014-02-03 NOTE — Patient Instructions (Signed)

## 2014-02-06 LAB — URINE CULTURE: Colony Count: >=100000

## 2014-03-07 ENCOUNTER — Other Ambulatory Visit: Payer: Self-pay | Admitting: Family Medicine

## 2014-03-27 ENCOUNTER — Telehealth: Payer: Self-pay | Admitting: Family Medicine

## 2014-03-27 DIAGNOSIS — E785 Hyperlipidemia, unspecified: Secondary | ICD-10-CM

## 2014-03-27 DIAGNOSIS — R739 Hyperglycemia, unspecified: Secondary | ICD-10-CM

## 2014-03-27 NOTE — Telephone Encounter (Signed)
-----   Message from Ellamae Sia sent at 03/22/2014  2:08 PM EST ----- Regarding: Lab orders for Monday, 12.28.15 LAB ORDERS FOR A 3 MONTH F/U

## 2014-03-28 ENCOUNTER — Other Ambulatory Visit (INDEPENDENT_AMBULATORY_CARE_PROVIDER_SITE_OTHER): Payer: Medicare Other

## 2014-03-28 DIAGNOSIS — R739 Hyperglycemia, unspecified: Secondary | ICD-10-CM

## 2014-03-28 DIAGNOSIS — E785 Hyperlipidemia, unspecified: Secondary | ICD-10-CM

## 2014-03-28 LAB — LIPID PANEL
CHOL/HDL RATIO: 3
Cholesterol: 196 mg/dL (ref 0–200)
HDL: 67.5 mg/dL (ref >39.00)
LDL CALC: 120 mg/dL — AB (ref 0–99)
NONHDL: 128.5
TRIGLYCERIDES: 43 mg/dL (ref 0.0–149.0)
VLDL: 8.6 mg/dL (ref 0.0–40.0)

## 2014-03-28 LAB — HEMOGLOBIN A1C: HEMOGLOBIN A1C: 6.2 % (ref 4.6–6.5)

## 2014-03-30 ENCOUNTER — Encounter: Payer: Self-pay | Admitting: Family Medicine

## 2014-03-30 ENCOUNTER — Ambulatory Visit (INDEPENDENT_AMBULATORY_CARE_PROVIDER_SITE_OTHER): Payer: Medicare Other | Admitting: Family Medicine

## 2014-03-30 VITALS — BP 126/80 | HR 61 | Temp 98.5°F | Ht 63.0 in | Wt 156.0 lb

## 2014-03-30 DIAGNOSIS — R739 Hyperglycemia, unspecified: Secondary | ICD-10-CM

## 2014-03-30 DIAGNOSIS — E785 Hyperlipidemia, unspecified: Secondary | ICD-10-CM

## 2014-03-30 NOTE — Assessment & Plan Note (Signed)
Improved with lower fat diet and increased activity  Enc her to keep up the good work and do any low impact exercise she can Disc goals for lipids and reasons to control them Rev labs with pt Rev low sat fat diet in detail Lab and f/u in June

## 2014-03-30 NOTE — Patient Instructions (Signed)
Cholesterol is improved - keep up the good work  Avoid red meat/ fried foods/ egg yolks/ fatty breakfast meats/ butter, cheese and high fat dairy/ and shellfish   Glucose is borderline - "pre" diabetes - so keep watching sugar in diet and be as active as you can ( try to do any low impact exercise you can)   Follow up after June 6th for annual exam with labs prior

## 2014-03-30 NOTE — Progress Notes (Signed)
Pre visit review using our clinic review tool, if applicable. No additional management support is needed unless otherwise documented below in the visit note. 

## 2014-03-30 NOTE — Progress Notes (Signed)
Subjective:    Patient ID: Lindsey [name], female    DOB: 11/14/1955, 58 y.o.   MRN: [id number]  HPI Here for f/u of chol and hyperglycemia   Doing ok overall   Wt is down 1 lb with bmi of 27  Hyperlipidemia Lab Results  Component Value Date   CHOL 196 03/28/2014   CHOL 201* 12/17/2013   CHOL 228* 11/27/2012   Lab Results  Component Value Date   HDL 67.50 03/28/2014   HDL 45.00 12/17/2013   HDL 78.70 11/27/2012   Lab Results  Component Value Date   LDLCALC 120* 03/28/2014   LDLCALC 142* 12/17/2013   LDLCALC 93 01/17/2010   Lab Results  Component Value Date   TRIG 43.0 03/28/2014   TRIG 69.0 12/17/2013   TRIG 121.0 11/27/2012   Lab Results  Component Value Date   CHOLHDL 3 03/28/2014   CHOLHDL 4 12/17/2013   CHOLHDL 3 11/27/2012   Lab Results  Component Value Date   LDLDIRECT 137.4 11/27/2012   LDLDIRECT 166.1 07/24/2012   LDLDIRECT 161.5 07/03/2011   Disc diet change at last visit - trying to do better  She has been moving more since last visit    Hyperglycemia  Pt had fasting gluc of 135 last time Lab Results  Component Value Date   HGBA1C 6.2 03/28/2014  she has cut out a lot of sugar  She cut out sweets  Had one pc of pie at xmas-otherwise much better  She thinks she does feel a bit better off the sugar   Had to come in several times for utis  Has IC  Urine goes from clear to cloudy  Was last on macrodantin- feels a lot better    Patient Active Problem List   Diagnosis Date Noted  . Hyperglycemia 12/24/2013  . Acute pyelonephritis 09/23/2013  . Encounter for Medicare annual wellness exam 09/04/2012  . Low TSH level 09/04/2012  . Skin lesion 08/19/2012  . HSV (herpes simplex virus) infection 04/08/2012  . IBS (irritable bowel syndrome) 10/21/2011  . Routine general medical examination at a health care facility 07/02/2011  . OSTEOPENIA 05/17/2009  . DEPRESSION 10/05/2008  . GERD 05/05/2008  . OBSTRUCTIVE SLEEP APNEA 06/25/2007  .  Hyperlipidemia 05/31/2007  . HYPERTENSION 05/31/2007  . SOMNOLENCE 04/23/2007  . DEFICIENCY, VITAMIN D NOS 01/22/2007  . MIGRAINE HEADACHE 01/22/2007  . CYSTITIS, CHRONIC INTERSTITIAL 01/22/2007  . MENOPAUSE-RELATED VASOMOTOR SYMPTOMS 01/22/2007  . OSTEOARTHROSIS, GENERALIZED, MULTIPLE SITES 01/22/2007  . FIBROMYALGIA 01/22/2007  . PATENT FORAMEN OVALE 01/22/2007   Past Medical History  Diagnosis Date  . Interstitial cystitis   . Pelvic floor dysfunction   . Arthritis   . Vitamin D deficiency   . Chronic fatigue   . Tendonitis     hand  . Migraines   . PFO (patent foramen ovale)   . Fibromyalgia   . Hyperlipidemia   . Hypertension   . Hypersomnia   . IBS (irritable bowel syndrome)   . Hemorrhoids   . Osteopenia    Past Surgical History  Procedure Laterality Date  . Fracture surgery      ankle fracture x 2  . Breast lumpectomy      many, bilateral  . Vaginal hysterectomy  1990    because of the IC partial  . Nasal sinus surgery      multiple, x4  . Bladder surgery      and hydrodistension  . Cervical discectomy  2006    fusion  and plating  . Colonoscopy    . Upper gastrointestinal endoscopy     History  Substance Use Topics  . Smoking status: Never Smoker   . Smokeless tobacco: Never Used  . Alcohol Use: No   Family History  Problem Relation Age of Onset  . Fibromyalgia Mother   . Diabetes Father   . Alcohol abuse Father   . Colon cancer Paternal Uncle   . Stomach cancer Paternal Grandfather   . Diabetes Paternal Grandmother   . Diabetes Paternal Grandfather   . Heart disease Father   . Stroke Paternal Grandmother    Allergies  Allergen Reactions  . Buspirone Hcl     REACTION: dystonia  . Simvastatin     REACTION: malaise  . Sulfa Antibiotics     nausea   Current Outpatient Prescriptions on File Prior to Visit  Medication Sig Dispense Refill  . atenolol (TENORMIN) 100 MG tablet TAKE 1 TABLET DAILY 90 tablet 1  . baclofen (LIORESAL) 10 MG  tablet Take as directed as needed     . cholecalciferol (VITAMIN D) 1000 UNITS tablet Take 4,000 Units by mouth daily.     . diazepam (VALIUM) 5 MG tablet 1 TAB EVERY PM    . dicyclomine (BENTYL) 20 MG tablet Take one tab before meals 90 tablet 5  . DULoxetine (CYMBALTA) 60 MG capsule Take 1 capsule by mouth daily.    . fentaNYL (DURAGESIC - DOSED MCG/HR) 50 MCG/HR Place 1 patch onto the skin every other day.      Marland Kitchen HYDROcodone-acetaminophen (LORCET) 10-650 MG per tablet Take 1 tablet by mouth every 6 (six) hours as needed.      . hydrOXYzine (ATARAX) 50 MG tablet Take 50 mg by mouth 2 (two) times daily.      Marland Kitchen lidocaine (LIDODERM) 5 % Place 1 patch onto the skin daily. Remove & Discard patch within 12 hours or as directed by MD     . pentosan polysulfate (ELMIRON) 100 MG capsule Take two by mouth two times daily     . polyethylene glycol powder (MIRALAX) powder Take 17 g by mouth daily.       No current facility-administered medications on file prior to visit.     Review of Systems Review of Systems  Constitutional: Negative for fever, appetite change,  and unexpected weight change.  Eyes: Negative for pain and visual disturbance.  Respiratory: Negative for cough and shortness of breath.   Cardiovascular: Negative for cp or palpitations    Gastrointestinal: Negative for nausea, diarrhea and constipation.  Genitourinary: pos for baseline  for urgency and frequency.  Skin: Negative for pallor or rash   MSK pos for baseline myofascial pain  Neurological: Negative for weakness, light-headedness, numbness and headaches.  Hematological: Negative for adenopathy. Does not bruise/bleed easily.  Psychiatric/Behavioral: Negative for dysphoric mood. The patient is not nervous/anxious.         Objective:   Physical Exam  Constitutional: She appears well-developed and well-nourished. No distress.  HENT:  Head: Normocephalic and atraumatic.  Mouth/Throat: Oropharynx is clear and moist.  Eyes:  Conjunctivae and EOM are normal. No scleral icterus.  Neck: Normal range of motion. Neck supple.  Cardiovascular: Normal rate, regular rhythm, normal heart sounds and intact distal pulses.   Pulmonary/Chest: Effort normal and breath sounds normal. No respiratory distress. She has no wheezes. She has no rales. She exhibits no tenderness.  Musculoskeletal: She exhibits no edema.  Lymphadenopathy:    She has no cervical adenopathy.  Neurological: She is alert.  Skin: Skin is warm and dry. No rash noted. No erythema. No pallor.  Psychiatric: She has a normal mood and affect.          Assessment & Plan:   Problem List Items Addressed This Visit      Other   Hyperglycemia    Lab Results  Component Value Date   HGBA1C 6.2 03/28/2014   Disc prediabetes - and need to reduce sugar in diet/ keep loosing wt and be as active as tolerable Re check 6 mo with PE     Hyperlipidemia - Primary    Improved with lower fat diet and increased activity  Enc her to keep up the good work and do any low impact exercise she can Disc goals for lipids and reasons to control them Rev labs with pt Rev low sat fat diet in detail Lab and f/u in June

## 2014-03-30 NOTE — Assessment & Plan Note (Signed)
Lab Results  Component Value Date   HGBA1C 6.2 03/28/2014   Disc prediabetes - and need to reduce sugar in diet/ keep loosing wt and be as active as tolerable Re check 6 mo with PE

## 2014-04-21 DIAGNOSIS — G43719 Chronic migraine without aura, intractable, without status migrainosus: Secondary | ICD-10-CM | POA: Diagnosis not present

## 2014-04-27 DIAGNOSIS — M1711 Unilateral primary osteoarthritis, right knee: Secondary | ICD-10-CM | POA: Diagnosis not present

## 2014-06-08 DIAGNOSIS — M1712 Unilateral primary osteoarthritis, left knee: Secondary | ICD-10-CM | POA: Diagnosis not present

## 2014-06-08 DIAGNOSIS — M1711 Unilateral primary osteoarthritis, right knee: Secondary | ICD-10-CM | POA: Diagnosis not present

## 2014-06-09 DIAGNOSIS — R5382 Chronic fatigue, unspecified: Secondary | ICD-10-CM | POA: Diagnosis not present

## 2014-06-09 DIAGNOSIS — K580 Irritable bowel syndrome with diarrhea: Secondary | ICD-10-CM | POA: Diagnosis not present

## 2014-06-09 DIAGNOSIS — N3010 Interstitial cystitis (chronic) without hematuria: Secondary | ICD-10-CM | POA: Diagnosis not present

## 2014-06-09 DIAGNOSIS — F329 Major depressive disorder, single episode, unspecified: Secondary | ICD-10-CM | POA: Diagnosis not present

## 2014-06-09 DIAGNOSIS — N9489 Other specified conditions associated with female genital organs and menstrual cycle: Secondary | ICD-10-CM | POA: Diagnosis not present

## 2014-06-09 DIAGNOSIS — Z09 Encounter for follow-up examination after completed treatment for conditions other than malignant neoplasm: Secondary | ICD-10-CM | POA: Diagnosis not present

## 2014-06-15 DIAGNOSIS — M1712 Unilateral primary osteoarthritis, left knee: Secondary | ICD-10-CM | POA: Diagnosis not present

## 2014-06-15 DIAGNOSIS — M1711 Unilateral primary osteoarthritis, right knee: Secondary | ICD-10-CM | POA: Diagnosis not present

## 2014-06-22 DIAGNOSIS — M1711 Unilateral primary osteoarthritis, right knee: Secondary | ICD-10-CM | POA: Diagnosis not present

## 2014-06-22 DIAGNOSIS — M1712 Unilateral primary osteoarthritis, left knee: Secondary | ICD-10-CM | POA: Diagnosis not present

## 2014-07-18 DIAGNOSIS — R51 Headache: Secondary | ICD-10-CM | POA: Diagnosis not present

## 2014-07-18 DIAGNOSIS — Z79899 Other long term (current) drug therapy: Secondary | ICD-10-CM | POA: Diagnosis not present

## 2014-07-18 DIAGNOSIS — G43719 Chronic migraine without aura, intractable, without status migrainosus: Secondary | ICD-10-CM | POA: Diagnosis not present

## 2014-08-29 ENCOUNTER — Other Ambulatory Visit: Payer: Self-pay | Admitting: Family Medicine

## 2014-08-30 DIAGNOSIS — R5382 Chronic fatigue, unspecified: Secondary | ICD-10-CM | POA: Diagnosis not present

## 2014-08-30 DIAGNOSIS — K580 Irritable bowel syndrome with diarrhea: Secondary | ICD-10-CM | POA: Diagnosis not present

## 2014-08-30 DIAGNOSIS — Z09 Encounter for follow-up examination after completed treatment for conditions other than malignant neoplasm: Secondary | ICD-10-CM | POA: Diagnosis not present

## 2014-08-30 DIAGNOSIS — N3010 Interstitial cystitis (chronic) without hematuria: Secondary | ICD-10-CM | POA: Diagnosis not present

## 2014-08-30 DIAGNOSIS — R1030 Lower abdominal pain, unspecified: Secondary | ICD-10-CM | POA: Diagnosis not present

## 2014-08-30 DIAGNOSIS — N9489 Other specified conditions associated with female genital organs and menstrual cycle: Secondary | ICD-10-CM | POA: Diagnosis not present

## 2014-09-26 DIAGNOSIS — G4700 Insomnia, unspecified: Secondary | ICD-10-CM | POA: Diagnosis not present

## 2014-09-26 DIAGNOSIS — M797 Fibromyalgia: Secondary | ICD-10-CM | POA: Diagnosis not present

## 2014-09-26 DIAGNOSIS — L719 Rosacea, unspecified: Secondary | ICD-10-CM | POA: Diagnosis not present

## 2014-09-26 DIAGNOSIS — R5381 Other malaise: Secondary | ICD-10-CM | POA: Diagnosis not present

## 2014-10-19 DIAGNOSIS — G43719 Chronic migraine without aura, intractable, without status migrainosus: Secondary | ICD-10-CM | POA: Diagnosis not present

## 2014-10-23 ENCOUNTER — Telehealth: Payer: Self-pay | Admitting: Family Medicine

## 2014-10-23 DIAGNOSIS — R739 Hyperglycemia, unspecified: Secondary | ICD-10-CM

## 2014-10-23 DIAGNOSIS — E559 Vitamin D deficiency, unspecified: Secondary | ICD-10-CM

## 2014-10-23 DIAGNOSIS — E785 Hyperlipidemia, unspecified: Secondary | ICD-10-CM

## 2014-10-23 DIAGNOSIS — I10 Essential (primary) hypertension: Secondary | ICD-10-CM

## 2014-10-23 NOTE — Telephone Encounter (Signed)
-----   Message from Marchia Bond sent at 10/13/2014  4:15 PM EDT ----- Regarding: Cpx labs Mon 7/25, need orders please :-) Please order  future cpx labs for pt's upcoming lab appt. Thanks Aniceto Boss

## 2014-10-24 ENCOUNTER — Other Ambulatory Visit (INDEPENDENT_AMBULATORY_CARE_PROVIDER_SITE_OTHER): Payer: Medicare Other

## 2014-10-24 DIAGNOSIS — R739 Hyperglycemia, unspecified: Secondary | ICD-10-CM | POA: Diagnosis not present

## 2014-10-24 DIAGNOSIS — I10 Essential (primary) hypertension: Secondary | ICD-10-CM

## 2014-10-24 DIAGNOSIS — E559 Vitamin D deficiency, unspecified: Secondary | ICD-10-CM

## 2014-10-24 DIAGNOSIS — E785 Hyperlipidemia, unspecified: Secondary | ICD-10-CM

## 2014-10-24 LAB — COMPREHENSIVE METABOLIC PANEL
ALT: 37 U/L — ABNORMAL HIGH (ref 0–35)
AST: 22 U/L (ref 0–37)
Albumin: 3.7 g/dL (ref 3.5–5.2)
Alkaline Phosphatase: 49 U/L (ref 39–117)
BILIRUBIN TOTAL: 0.4 mg/dL (ref 0.2–1.2)
BUN: 9 mg/dL (ref 6–23)
CHLORIDE: 101 meq/L (ref 96–112)
CO2: 30 meq/L (ref 19–32)
Calcium: 9.7 mg/dL (ref 8.4–10.5)
Creatinine, Ser: 0.8 mg/dL (ref 0.40–1.20)
GFR: 78.3 mL/min (ref >60.00)
Glucose, Bld: 109 mg/dL — ABNORMAL HIGH (ref 70–99)
POTASSIUM: 4.9 meq/L (ref 3.5–5.1)
Sodium: 137 mEq/L (ref 135–145)
Total Protein: 6.8 g/dL (ref 6.0–8.3)

## 2014-10-24 LAB — CBC WITH DIFFERENTIAL/PLATELET
BASOS PCT: 0.5 % (ref 0.0–3.0)
Basophils Absolute: 0 10*3/uL (ref 0.0–0.1)
Eosinophils Absolute: 0.1 10*3/uL (ref 0.0–0.7)
Eosinophils Relative: 0.8 % (ref 0.0–5.0)
HEMATOCRIT: 42 % (ref 36.0–46.0)
Hemoglobin: 13.8 g/dL (ref 12.0–15.0)
LYMPHS ABS: 3.8 10*3/uL (ref 0.7–4.0)
Lymphocytes Relative: 49.3 % — ABNORMAL HIGH (ref 12.0–46.0)
MCHC: 32.9 g/dL (ref 30.0–36.0)
MCV: 90.5 fl (ref 78.0–100.0)
Monocytes Absolute: 0.5 10*3/uL (ref 0.1–1.0)
Monocytes Relative: 7.1 % (ref 3.0–12.0)
Neutro Abs: 3.3 10*3/uL (ref 1.4–7.7)
Neutrophils Relative %: 42.3 % — ABNORMAL LOW (ref 43.0–77.0)
PLATELETS: 236 10*3/uL (ref 150.0–400.0)
RBC: 4.64 Mil/uL (ref 3.87–5.11)
RDW: 15.1 % (ref 11.5–15.5)
WBC: 7.7 10*3/uL (ref 4.0–10.5)

## 2014-10-24 LAB — HEMOGLOBIN A1C: Hgb A1c MFr Bld: 6.2 % (ref 4.6–6.5)

## 2014-10-24 LAB — LIPID PANEL
CHOL/HDL RATIO: 4
Cholesterol: 204 mg/dL — ABNORMAL HIGH (ref 0–200)
HDL: 52.9 mg/dL (ref >39.00)
LDL Cholesterol: 138 mg/dL — ABNORMAL HIGH (ref 0–99)
NonHDL: 151.1
TRIGLYCERIDES: 66 mg/dL (ref 0.0–149.0)
VLDL: 13.2 mg/dL (ref 0.0–40.0)

## 2014-10-24 LAB — TSH: TSH: 3.6 u[IU]/mL (ref 0.35–4.50)

## 2014-10-24 LAB — VITAMIN D 25 HYDROXY (VIT D DEFICIENCY, FRACTURES): VITD: 35.94 ng/mL (ref 30.00–100.00)

## 2014-10-25 ENCOUNTER — Other Ambulatory Visit: Payer: Self-pay

## 2014-10-25 DIAGNOSIS — Z1231 Encounter for screening mammogram for malignant neoplasm of breast: Secondary | ICD-10-CM

## 2014-10-31 ENCOUNTER — Encounter: Payer: Self-pay | Admitting: Family Medicine

## 2014-10-31 ENCOUNTER — Ambulatory Visit (INDEPENDENT_AMBULATORY_CARE_PROVIDER_SITE_OTHER): Payer: Medicare Other | Admitting: Family Medicine

## 2014-10-31 VITALS — BP 110/72 | HR 70 | Temp 98.9°F | Ht 63.0 in | Wt 164.5 lb

## 2014-10-31 DIAGNOSIS — M8580 Other specified disorders of bone density and structure, unspecified site: Secondary | ICD-10-CM | POA: Diagnosis not present

## 2014-10-31 DIAGNOSIS — Z0000 Encounter for general adult medical examination without abnormal findings: Secondary | ICD-10-CM | POA: Diagnosis not present

## 2014-10-31 DIAGNOSIS — I10 Essential (primary) hypertension: Secondary | ICD-10-CM

## 2014-10-31 DIAGNOSIS — E559 Vitamin D deficiency, unspecified: Secondary | ICD-10-CM

## 2014-10-31 DIAGNOSIS — R739 Hyperglycemia, unspecified: Secondary | ICD-10-CM | POA: Diagnosis not present

## 2014-10-31 DIAGNOSIS — E785 Hyperlipidemia, unspecified: Secondary | ICD-10-CM

## 2014-10-31 DIAGNOSIS — L309 Dermatitis, unspecified: Secondary | ICD-10-CM

## 2014-10-31 MED ORDER — ATENOLOL 100 MG PO TABS: 100.0000 mg | ORAL_TABLET | Freq: Every day | ORAL | Status: DC

## 2014-10-31 MED ORDER — CLOTRIMAZOLE-BETAMETHASONE 1-0.05 % EX LOTN: TOPICAL_LOTION | Freq: Two times a day (BID) | CUTANEOUS | Status: DC

## 2014-10-31 NOTE — Assessment & Plan Note (Signed)
Followed by Dr Garen Grams dexa 4/15  On ca and D  Disc D level - her rheumatologist does not want her to inc dose No falls or fx

## 2014-10-31 NOTE — Progress Notes (Signed)
Pre visit review using our clinic review tool, if applicable. No additional management support is needed unless otherwise documented below in the visit note. 

## 2014-10-31 NOTE — Assessment & Plan Note (Signed)
Lab Results  Component Value Date   HGBA1C 6.2 10/24/2014   Stable  Enc wt loss to prevent DM

## 2014-10-31 NOTE — Progress Notes (Signed)
Subjective:    Patient ID: Lindsey [name], female    DOB: [birth year], 59 y.o.   MRN: [id number]  HPI Here for annual medicare wellness visit as well as chronic/acute medical problems as well as annual preventative exam  Wt is up 8 lb with bmi of 29  I have personally reviewed the Medicare Annual Wellness questionnaire and have noted 1. The patient's medical and social history 2. Their use of alcohol, tobacco or illicit drugs 3. Their current medications and supplements 4. The patient's functional ability including ADL's, fall risks, home safety risks and hearing or visual             impairment. 5. Diet and physical activities 6. Evidence for depression or mood disorders  The patients weight, height, BMI have been recorded in the chart and visual acuity is per eye clinic.  I have made referrals, counseling and provided education to the patient based review of the above and I have provided the pt with a written personalized care plan for preventive services. Reviewed and updated provider list, see scanned forms.  Coping /managing with her current problems  Had some flexor shots in her knees - it was helpful (mod to severe OA)   See scanned forms.  Routine anticipatory guidance given to patient.  See health maintenance. Colon cancer screening 5/12 - 10 y recall  Hep C/HIV - thinks she is low risk and declines  Breast cancer screening mammogram in 4/15- is scheduled for her next mammogram 12/01/14  Self breast exam-no lumps or changes  Flu vaccine 9/15  Tetanus vaccine 1/09  Pneumovax 6/14 Zoster vaccine insurance will not cover yet -will re check when she turns 61  dexa 4/15 with mild osteopenia (no fractures) , she occ stumbles on stairs - does not fully fall    Advance directive- does not have -given handout  Cognitive function addressed- see scanned forms- and if abnormal then additional documentation follows.  No changes per her (she has the fibro fog problem and  medications)  PMH and SH reviewed  Meds, vitals, and allergies reviewed.   ROS: See HPI.  Otherwise negative.     Hyperglycemia Lab Results  Component Value Date   HGBA1C 6.2 10/24/2014  just the same as last time  Trying not to eat as much sugar (struggles with ice cream) Cannot get exercise since she is "so tired" and exercise makes her bladder hurt much worse - difficult for her  Had more bladder infections with water exercise /cold water bothers her (skin is sensitive)  Vit D def  Level is 35 this is down Takes her vit D (may have missed some doses)  Does not get out much- too hot  Takes 2000 iu bid    bp is stable today  No cp or palpitations or headaches or edema  No side effects to medicines  BP Readings from Last 3 Encounters:  10/31/14 110/72  03/30/14 126/80  02/03/14 108/66     Cholesterol Lab Results  Component Value Date   CHOL 204* 10/24/2014   CHOL 196 03/28/2014   CHOL 201* 12/17/2013   Lab Results  Component Value Date   HDL 52.90 10/24/2014   HDL 67.50 03/28/2014   HDL 45.00 12/17/2013   Lab Results  Component Value Date   LDLCALC 138* 10/24/2014   LDLCALC 120* 03/28/2014   LDLCALC 142* 12/17/2013   Lab Results  Component Value Date   TRIG 66.0 10/24/2014   TRIG 43.0 03/28/2014  TRIG 69.0 12/17/2013   Lab Results  Component Value Date   CHOLHDL 4 10/24/2014   CHOLHDL 3 03/28/2014   CHOLHDL 4 12/17/2013   Lab Results  Component Value Date   LDLDIRECT 137.4 11/27/2012   LDLDIRECT 166.1 07/24/2012   LDLDIRECT 161.5 07/03/2011  LDL is up to 138 (was 120) She does eat ice cream  occ eats burger or fries      Rash on foot (lateral right) - since Jan -waxes and wanes Tried cortisone cream - helped just a bit  slt itchy     Chemistry      Component Value Date/Time   NA 137 10/24/2014 1017   K 4.9 10/24/2014 1017   CL 101 10/24/2014 1017   CO2 30 10/24/2014 1017   BUN 9 10/24/2014 1017   CREATININE 0.80 10/24/2014 1017       Component Value Date/Time   CALCIUM 9.7 10/24/2014 1017   ALKPHOS 49 10/24/2014 1017   AST 22 10/24/2014 1017   ALT 37* 10/24/2014 1017   BILITOT 0.4 10/24/2014 1017      Lab Results  Component Value Date   WBC 7.7 10/24/2014   HGB 13.8 10/24/2014   HCT 42.0 10/24/2014   MCV 90.5 10/24/2014   PLT 236.0 10/24/2014    Lab Results  Component Value Date   TSH 3.60 10/24/2014    Patient Active Problem List   Diagnosis Date Noted  . Hyperglycemia 12/24/2013  . Acute pyelonephritis 09/23/2013  . Encounter for Medicare annual wellness exam 09/04/2012  . Low TSH level 09/04/2012  . Skin lesion 08/19/2012  . HSV (herpes simplex virus) infection 04/08/2012  . IBS (irritable bowel syndrome) 10/21/2011  . Routine general medical examination at a health care facility 07/02/2011  . Osteopenia 05/17/2009  . DEPRESSION 10/05/2008  . GERD 05/05/2008  . OBSTRUCTIVE SLEEP APNEA 06/25/2007  . Hyperlipidemia 05/31/2007  . Essential hypertension 05/31/2007  . SOMNOLENCE 04/23/2007  . Vitamin D deficiency 01/22/2007  . MIGRAINE HEADACHE 01/22/2007  . CYSTITIS, CHRONIC INTERSTITIAL 01/22/2007  . MENOPAUSE-RELATED VASOMOTOR SYMPTOMS 01/22/2007  . OSTEOARTHROSIS, GENERALIZED, MULTIPLE SITES 01/22/2007  . FIBROMYALGIA 01/22/2007  . PATENT FORAMEN OVALE 01/22/2007   Past Medical History  Diagnosis Date  . Interstitial cystitis   . Pelvic floor dysfunction   . Arthritis   . Vitamin D deficiency   . Chronic fatigue   . Tendonitis     hand  . Migraines   . PFO (patent foramen ovale)   . Fibromyalgia   . Hyperlipidemia   . Hypertension   . Hypersomnia   . IBS (irritable bowel syndrome)   . Hemorrhoids   . Osteopenia    Past Surgical History  Procedure Laterality Date  . Fracture surgery      ankle fracture x 2  . Breast lumpectomy      many, bilateral  . Vaginal hysterectomy  1990    because of the IC partial  . Nasal sinus surgery      multiple, x4  . Bladder surgery       and hydrodistension  . Cervical discectomy  2006    fusion and plating  . Colonoscopy    . Upper gastrointestinal endoscopy     History  Substance Use Topics  . Smoking status: Never Smoker   . Smokeless tobacco: Never Used  . Alcohol Use: No   Family History  Problem Relation Age of Onset  . Fibromyalgia Mother   . Diabetes Father   . Alcohol abuse  Father   . Colon cancer Paternal Uncle   . Stomach cancer Paternal Grandfather   . Diabetes Paternal Grandmother   . Diabetes Paternal Grandfather   . Heart disease Father   . Stroke Paternal Grandmother    Allergies  Allergen Reactions  . Buspirone Hcl     REACTION: dystonia  . Simvastatin     REACTION: malaise  . Sulfa Antibiotics     nausea   Current Outpatient Prescriptions on File Prior to Visit  Medication Sig Dispense Refill  . atenolol (TENORMIN) 100 MG tablet TAKE 1 TABLET DAILY 90 tablet 0  . baclofen (LIORESAL) 10 MG tablet Take as directed as needed     . cholecalciferol (VITAMIN D) 1000 UNITS tablet Take 4,000 Units by mouth daily.     . diazepam (VALIUM) 5 MG tablet 1 TAB EVERY PM    . dicyclomine (BENTYL) 20 MG tablet Take one tab before meals 90 tablet 5  . DULoxetine (CYMBALTA) 60 MG capsule Take 1 capsule by mouth daily.    . fentaNYL (DURAGESIC - DOSED MCG/HR) 50 MCG/HR Place 1 patch onto the skin every other day.      Marland Kitchen HYDROcodone-acetaminophen (LORCET) 10-650 MG per tablet Take 1 tablet by mouth every 6 (six) hours as needed.      . hydrOXYzine (ATARAX) 50 MG tablet Take 50 mg by mouth 2 (two) times daily.      Marland Kitchen lidocaine (LIDODERM) 5 % Place 1 patch onto the skin daily. Remove & Discard patch within 12 hours or as directed by MD     . nitrofurantoin (MACRODANTIN) 50 MG capsule Take 50 mg by mouth daily.    . pentosan polysulfate (ELMIRON) 100 MG capsule Take two by mouth two times daily     . polyethylene glycol powder (MIRALAX) powder Take 17 g by mouth daily.       No current  facility-administered medications on file prior to visit.    Review of Systems Review of Systems  Constitutional: Negative for fever, appetite change, fatigue and unexpected weight change.  Eyes: Negative for pain and visual disturbance.  Respiratory: Negative for cough and shortness of breath.   Cardiovascular: Negative for cp or palpitations    Gastrointestinal: Negative for nausea, diarrhea and constipation.  Genitourinary: Negative for urgency and frequency.  Skin: Negative for pallor or rash   Neurological: Negative for weakness, light-headedness, numbness and headaches.  Hematological: Negative for adenopathy. Does not bruise/bleed easily.  Psychiatric/Behavioral: Negative for dysphoric mood. The patient is not nervous/anxious.         Objective:   Physical Exam  Constitutional: She appears well-developed and well-nourished. No distress.  overwt and well appearing   HENT:  Head: Normocephalic and atraumatic.  Right Ear: External ear normal.  Left Ear: External ear normal.  Mouth/Throat: Oropharynx is clear and moist.  Eyes: Conjunctivae and EOM are normal. Pupils are equal, round, and reactive to light. No scleral icterus.  Neck: Normal range of motion. Neck supple. No JVD present. Carotid bruit is not present. No thyromegaly present.  Cardiovascular: Normal rate, regular rhythm, normal heart sounds and intact distal pulses.  Exam reveals no gallop.   Pulmonary/Chest: Effort normal and breath sounds normal. No respiratory distress. She has no wheezes. She exhibits no tenderness.  Abdominal: Soft. Bowel sounds are normal. She exhibits no distension, no abdominal bruit and no mass. There is no tenderness.  Genitourinary: No breast swelling, tenderness, discharge or bleeding.  Breast exam: No mass, nodules, thickening, tenderness,  bulging, retraction, inflamation, nipple discharge or skin changes noted.  No axillary or clavicular LA.    Post surgical bx scars noted baseline    Musculoskeletal: Normal range of motion. She exhibits no edema or tenderness.  Lymphadenopathy:    She has no cervical adenopathy.  Neurological: She is alert. She has normal reflexes. No cranial nerve deficit. She exhibits normal muscle tone. Coordination normal.  Skin: Skin is warm and dry. No rash noted. No erythema. No pallor.  Psychiatric: She has a normal mood and affect.          Assessment & Plan:   Problem List Items Addressed This Visit    Dermatitis    On R foot -med and lat with peeling ? 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 specialist f/u      Essential hypertension - Primary    bp in fair control at this time  BP Readings from Last 1 Encounters:  10/31/14 110/72   No changes needed Disc lifstyle change with low sodium diet and exercise  Labs reviewed       Relevant Medications   atenolol (TENORMIN) 100 MG tablet   Hyperglycemia    Lab Results  Component Value Date   HGBA1C 6.2 10/24/2014   Stable  Enc wt loss to prevent DM       Hyperlipidemia    LDL is up  Disc goals for lipids and reasons to control them Rev labs with pt Rev low sat fat diet in detail Will try to cut back on ice cream       Relevant Medications   atenolol (TENORMIN) 100 MG tablet   Osteopenia    Followed by Dr Garen Grams dexa 4/15  On ca and D  Disc D level - her rheumatologist does not want her to inc dose No falls or fx       Routine general medical examination at a health care  facility    Reviewed health habits including diet and exercise and skin cancer prevention Reviewed appropriate screening tests for age  Also reviewed health mt list, fam hx and immunization status , as well as social and family history    See HPI Labs reviewed in detail Given info to work on advance directive Will check on coverage of zoster vaccine when she is 74  Unable to exercise-disc rom /stretches  Continue specialist f/u      Vitamin D deficiency    Level in 53s = was in 65s last year  Per pt - rheum does not want her to inc dose On 2000 iu bid  No sun exp Disc imp to bone and overall health  No fractures

## 2014-10-31 NOTE — Assessment & Plan Note (Signed)
LDL is up  Disc goals for lipids and reasons to control them Rev labs with pt Rev low sat fat diet in detail Will try to cut back on ice cream

## 2014-10-31 NOTE — Assessment & Plan Note (Signed)
bp in fair control at this time  BP Readings from Last 1 Encounters:  10/31/14 110/72   No changes needed Disc lifstyle change with low sodium diet and exercise  Labs reviewed

## 2014-10-31 NOTE — Assessment & Plan Note (Signed)
Reviewed health habits including diet and exercise and skin cancer prevention Reviewed appropriate screening tests for age  Also reviewed health mt list, fam hx and immunization status , as well as social and family history    See HPI Labs reviewed in detail Given info to work on advance directive Will check on coverage of zoster vaccine when she is 45  Unable to exercise-disc rom Barnett Applebaum  Continue specialist f/u

## 2014-10-31 NOTE — Assessment & Plan Note (Signed)
Reviewed health habits including diet and exercise and skin cancer prevention Reviewed appropriate screening tests for age  Also reviewed health mt list, fam hx and immunization status , as well as social and family history    See HPI Labs reviewed in detail Given info to work on advance directive Will check on coverage of zoster vaccine when she is 76  Unable to exercise-disc rom Barnett Applebaum  Continue specialist f/u

## 2014-10-31 NOTE — Patient Instructions (Signed)
Try the generic lotrisone lotion on your foot  Work on an advance directive (the handout I gave you) For cholesterol   Avoid red meat/ fried foods/ egg yolks/ fatty breakfast meats/ butter, cheese and high fat dairy/ and shellfish   Take care of yourself  Be as active as you can

## 2014-10-31 NOTE — Assessment & Plan Note (Signed)
On R foot -med and lat with peeling ? If poss fungal Px lotrisone lotion to use bid  Update if no imp

## 2014-10-31 NOTE — Assessment & Plan Note (Signed)
Level in 30s = was in 50s last year  Per pt - rheum does not want her to inc dose On 2000 iu bid  No sun exp Disc imp to bone and overall health  No fractures

## 2014-11-22 DIAGNOSIS — N3010 Interstitial cystitis (chronic) without hematuria: Secondary | ICD-10-CM | POA: Diagnosis not present

## 2014-11-22 DIAGNOSIS — K580 Irritable bowel syndrome with diarrhea: Secondary | ICD-10-CM | POA: Diagnosis not present

## 2014-11-22 DIAGNOSIS — N9489 Other specified conditions associated with female genital organs and menstrual cycle: Secondary | ICD-10-CM | POA: Diagnosis not present

## 2014-11-22 DIAGNOSIS — M797 Fibromyalgia: Secondary | ICD-10-CM | POA: Diagnosis not present

## 2014-11-22 DIAGNOSIS — F1190 Opioid use, unspecified, uncomplicated: Secondary | ICD-10-CM | POA: Diagnosis not present

## 2014-12-01 ENCOUNTER — Ambulatory Visit
Admission: RE | Admit: 2014-12-01 | Discharge: 2014-12-01 | Disposition: A | Discharge status: Home / Self Care | Payer: Medicare Other | Source: Ambulatory Visit

## 2014-12-01 DIAGNOSIS — Z1231 Encounter for screening mammogram for malignant neoplasm of breast: Secondary | ICD-10-CM | POA: Diagnosis not present

## 2015-01-17 ENCOUNTER — Encounter: Payer: Self-pay | Admitting: Internal Medicine

## 2015-01-26 DIAGNOSIS — G43719 Chronic migraine without aura, intractable, without status migrainosus: Secondary | ICD-10-CM | POA: Diagnosis not present

## 2015-02-07 DIAGNOSIS — R5382 Chronic fatigue, unspecified: Secondary | ICD-10-CM | POA: Diagnosis not present

## 2015-02-07 DIAGNOSIS — N3010 Interstitial cystitis (chronic) without hematuria: Secondary | ICD-10-CM | POA: Diagnosis not present

## 2015-02-07 DIAGNOSIS — N9489 Other specified conditions associated with female genital organs and menstrual cycle: Secondary | ICD-10-CM | POA: Diagnosis not present

## 2015-02-07 DIAGNOSIS — G43709 Chronic migraine without aura, not intractable, without status migrainosus: Secondary | ICD-10-CM | POA: Diagnosis not present

## 2015-02-07 DIAGNOSIS — K580 Irritable bowel syndrome with diarrhea: Secondary | ICD-10-CM | POA: Diagnosis not present

## 2015-02-16 DIAGNOSIS — H353130 Nonexudative age-related macular degeneration, bilateral, stage unspecified: Secondary | ICD-10-CM | POA: Diagnosis not present

## 2015-03-15 DIAGNOSIS — H353130 Nonexudative age-related macular degeneration, bilateral, stage unspecified: Secondary | ICD-10-CM | POA: Diagnosis not present

## 2015-03-28 DIAGNOSIS — M797 Fibromyalgia: Secondary | ICD-10-CM | POA: Diagnosis not present

## 2015-03-28 DIAGNOSIS — R5383 Other fatigue: Secondary | ICD-10-CM | POA: Diagnosis not present

## 2015-03-28 DIAGNOSIS — M170 Bilateral primary osteoarthritis of knee: Secondary | ICD-10-CM | POA: Diagnosis not present

## 2015-03-28 DIAGNOSIS — E559 Vitamin D deficiency, unspecified: Secondary | ICD-10-CM | POA: Diagnosis not present

## 2015-03-28 DIAGNOSIS — M2550 Pain in unspecified joint: Secondary | ICD-10-CM | POA: Diagnosis not present

## 2015-03-28 DIAGNOSIS — L309 Dermatitis, unspecified: Secondary | ICD-10-CM | POA: Diagnosis not present

## 2015-03-28 DIAGNOSIS — Z79899 Other long term (current) drug therapy: Secondary | ICD-10-CM | POA: Diagnosis not present

## 2015-04-17 ENCOUNTER — Encounter: Payer: Self-pay | Admitting: Family Medicine

## 2015-04-17 ENCOUNTER — Ambulatory Visit (INDEPENDENT_AMBULATORY_CARE_PROVIDER_SITE_OTHER): Payer: Medicare Other | Admitting: Family Medicine

## 2015-04-17 VITALS — BP 124/72 | HR 69 | Temp 99.2°F | Ht 63.0 in | Wt 162.5 lb

## 2015-04-17 DIAGNOSIS — J029 Acute pharyngitis, unspecified: Secondary | ICD-10-CM | POA: Diagnosis not present

## 2015-04-17 DIAGNOSIS — J020 Streptococcal pharyngitis: Secondary | ICD-10-CM

## 2015-04-17 LAB — POCT RAPID STREP A (OFFICE): RAPID STREP A SCREEN: POSITIVE — AB

## 2015-04-17 MED ORDER — AMOXICILLIN 500 MG PO CAPS: 500.0000 mg | ORAL_CAPSULE | Freq: Three times a day (TID) | ORAL | Status: DC

## 2015-04-17 NOTE — Patient Instructions (Signed)
You have strep throat  Take the amoxicillin as directed  Drink fluids  Tylenol or ibuprofen for pain and fever  Lots of rest  Update if not starting to improve in a week or if worsening  - if you cannot swallow you own spit - let us know or go to the ER

## 2015-04-17 NOTE — Assessment & Plan Note (Signed)
Pos RST today tx with amoxicillin  Fluids/rest Disc symptomatic care - see instructions on AVS  Update if not starting to improve in a week or if worsening

## 2015-04-17 NOTE — Progress Notes (Signed)
Pre visit review using our clinic review tool, if applicable. No additional management support is needed unless otherwise documented below in the visit note. 

## 2015-04-17 NOTE — Progress Notes (Signed)
Subjective:    Patient ID: Lindsey [name], female    DOB: 1956/01/30, 60 y.o.   MRN: [id number]  HPI Here with ST  Started Friday -noticed scratchy throat  Then felt bad-stayed in bed all day/ bad sore throat  Feels swollen in throat also  A little hoarse also   Mild temp 99.2 - higher at home yesterday   Chilled/achey   Rapid strep test is positive   Results for orders placed or performed in visit on 04/17/15  Rapid Strep A  Result Value Ref Range   Rapid Strep A Screen Positive (A) Negative     Patient Active Problem List   Diagnosis Date Noted  . Strep pharyngitis 04/17/2015  . Dermatitis 10/31/2014  . Hyperglycemia 12/24/2013  . Acute pyelonephritis 09/23/2013  . Encounter for Medicare annual wellness exam 09/04/2012  . Low TSH level 09/04/2012  . Skin lesion 08/19/2012  . HSV (herpes simplex virus) infection 04/08/2012  . IBS (irritable bowel syndrome) 10/21/2011  . Routine general medical examination at a health care facility 07/02/2011  . Osteopenia 05/17/2009  . DEPRESSION 10/05/2008  . GERD 05/05/2008  . OBSTRUCTIVE SLEEP APNEA 06/25/2007  . Hyperlipidemia 05/31/2007  . Essential hypertension 05/31/2007  . SOMNOLENCE 04/23/2007  . Vitamin D deficiency 01/22/2007  . MIGRAINE HEADACHE 01/22/2007  . CYSTITIS, CHRONIC INTERSTITIAL 01/22/2007  . MENOPAUSE-RELATED VASOMOTOR SYMPTOMS 01/22/2007  . OSTEOARTHROSIS, GENERALIZED, MULTIPLE SITES 01/22/2007  . FIBROMYALGIA 01/22/2007  . PATENT FORAMEN OVALE 01/22/2007   Past Medical History  Diagnosis Date  . Interstitial cystitis   . Pelvic floor dysfunction   . Arthritis   . Vitamin D deficiency   . Chronic fatigue   . Tendonitis     hand  . Migraines   . PFO (patent foramen ovale)   . Fibromyalgia   . Hyperlipidemia   . Hypertension   . Hypersomnia   . IBS (irritable bowel syndrome)   . Hemorrhoids   . Osteopenia    Past Surgical History  Procedure Laterality Date  . Fracture surgery      ankle  fracture x 2  . Breast lumpectomy      many, bilateral  . Vaginal hysterectomy  1990    because of the IC partial  . Nasal sinus surgery      multiple, x4  . Bladder surgery      and hydrodistension  . Cervical discectomy  2006    fusion and plating  . Colonoscopy    . Upper gastrointestinal endoscopy     Social History  Substance Use Topics  . Smoking status: Never Smoker   . Smokeless tobacco: Never Used  . Alcohol Use: No   Family History  Problem Relation Age of Onset  . Fibromyalgia Mother   . Diabetes Father   . Alcohol abuse Father   . Colon cancer Paternal Uncle   . Stomach cancer Paternal Grandfather   . Diabetes Paternal Grandmother   . Diabetes Paternal Grandfather   . Heart disease Father   . Stroke Paternal Grandmother    Allergies  Allergen Reactions  . Buspirone Hcl     REACTION: dystonia  . Simvastatin     REACTION: malaise  . Sulfa Antibiotics     nausea   Current Outpatient Prescriptions on File Prior to Visit  Medication Sig Dispense Refill  . atenolol (TENORMIN) 100 MG tablet Take 1 tablet (100 mg total) by mouth daily. 90 tablet 3  . baclofen (LIORESAL) 10 MG tablet Take as  directed as needed     . cholecalciferol (VITAMIN D) 1000 UNITS tablet Take 4,000 Units by mouth daily.     Marland Kitchen dicyclomine (BENTYL) 20 MG tablet Take one tab before meals 90 tablet 5  . DULoxetine (CYMBALTA) 60 MG capsule Take 1 capsule by mouth daily.    . fentaNYL (DURAGESIC - DOSED MCG/HR) 50 MCG/HR Place 1 patch onto the skin every other day.      Marland Kitchen HYDROcodone-acetaminophen (LORCET) 10-650 MG per tablet Take 1 tablet by mouth every 6 (six) hours as needed.      . hydrOXYzine (ATARAX) 50 MG tablet Take 50 mg by mouth 2 (two) times daily.      Marland Kitchen lidocaine (LIDODERM) 5 % Place 1 patch onto the skin daily. Remove & Discard patch within 12 hours or as directed by MD     . nitrofurantoin (MACRODANTIN) 50 MG capsule Take 50 mg by mouth daily.    . pentosan polysulfate  (ELMIRON) 100 MG capsule Take two by mouth two times daily     . polyethylene glycol powder (MIRALAX) powder Take 17 g by mouth daily.       No current facility-administered medications on file prior to visit.     Review of Systems Review of Systems  Constitutional: pos for low grade temp and malaise ENT neg for cong or rhinorrhea , pos for ST  Eyes: Negative for pain and visual disturbance.  Respiratory: Negative for cough and shortness of breath.   Cardiovascular: Negative for cp or palpitations    Gastrointestinal: Negative for nausea, diarrhea and constipation.  Genitourinary: Negative for urgency and frequency.  Skin: Negative for pallor or rash   Neurological: Negative for weakness, light-headedness, numbness and headaches.  Hematological: Negative for adenopathy. Does not bruise/bleed easily.  Psychiatric/Behavioral: Negative for dysphoric mood. The patient is not nervous/anxious.         Objective:   Physical Exam  Constitutional: She appears well-developed and well-nourished. No distress.  HENT:  Head: Normocephalic and atraumatic.  Right Ear: External ear normal.  Left Ear: External ear normal.  Mouth/Throat: No oropharyngeal exudate.  Pharyngeal erythema with mild swelling and some scant exudate  Pt is able to swallow  Eyes: Conjunctivae and EOM are normal. Pupils are equal, round, and reactive to light. Right eye exhibits no discharge. Left eye exhibits no discharge.  Neck: Normal range of motion. Neck supple.  Cardiovascular: Normal rate and regular rhythm.   Pulmonary/Chest: Effort normal and breath sounds normal. No respiratory distress. She has no wheezes. She has no rales.  Lymphadenopathy:    She has cervical adenopathy.  Neurological: She is alert.  Skin: Skin is warm and dry. No rash noted.  Psychiatric: She has a normal mood and affect.          Assessment & Plan:   Problem List Items Addressed This Visit      Respiratory   Strep pharyngitis     Pos RST today tx with amoxicillin  Fluids/rest Disc symptomatic care - see instructions on AVS  Update if not starting to improve in a week or if worsening          Other Visit Diagnoses    Sore throat    -  Primary    Relevant Orders    Rapid Strep A (Completed)

## 2015-04-25 DIAGNOSIS — L603 Nail dystrophy: Secondary | ICD-10-CM | POA: Diagnosis not present

## 2015-04-25 DIAGNOSIS — D239 Other benign neoplasm of skin, unspecified: Secondary | ICD-10-CM | POA: Diagnosis not present

## 2015-04-25 DIAGNOSIS — L309 Dermatitis, unspecified: Secondary | ICD-10-CM | POA: Diagnosis not present

## 2015-04-25 DIAGNOSIS — B351 Tinea unguium: Secondary | ICD-10-CM | POA: Diagnosis not present

## 2015-04-26 DIAGNOSIS — G43719 Chronic migraine without aura, intractable, without status migrainosus: Secondary | ICD-10-CM | POA: Diagnosis not present

## 2015-05-16 DIAGNOSIS — N3010 Interstitial cystitis (chronic) without hematuria: Secondary | ICD-10-CM | POA: Diagnosis not present

## 2015-05-16 DIAGNOSIS — R5382 Chronic fatigue, unspecified: Secondary | ICD-10-CM | POA: Diagnosis not present

## 2015-05-16 DIAGNOSIS — K582 Mixed irritable bowel syndrome: Secondary | ICD-10-CM | POA: Diagnosis not present

## 2015-05-16 DIAGNOSIS — G43709 Chronic migraine without aura, not intractable, without status migrainosus: Secondary | ICD-10-CM | POA: Diagnosis not present

## 2015-05-16 DIAGNOSIS — R8290 Unspecified abnormal findings in urine: Secondary | ICD-10-CM | POA: Diagnosis not present

## 2015-07-27 DIAGNOSIS — G43719 Chronic migraine without aura, intractable, without status migrainosus: Secondary | ICD-10-CM | POA: Diagnosis not present

## 2015-08-09 ENCOUNTER — Ambulatory Visit (INDEPENDENT_AMBULATORY_CARE_PROVIDER_SITE_OTHER): Payer: Medicare Other | Admitting: Family Medicine

## 2015-08-09 ENCOUNTER — Encounter: Payer: Self-pay | Admitting: Family Medicine

## 2015-08-09 VITALS — BP 128/70 | HR 80 | Temp 98.0°F | Wt 161.5 lb

## 2015-08-09 DIAGNOSIS — R1011 Right upper quadrant pain: Secondary | ICD-10-CM | POA: Diagnosis not present

## 2015-08-09 DIAGNOSIS — R110 Nausea: Secondary | ICD-10-CM | POA: Insufficient documentation

## 2015-08-09 DIAGNOSIS — K589 Irritable bowel syndrome without diarrhea: Secondary | ICD-10-CM | POA: Diagnosis not present

## 2015-08-09 LAB — CBC WITH DIFFERENTIAL/PLATELET
BASOS ABS: 0 10*3/uL (ref 0.0–0.1)
BASOS PCT: 0.4 % (ref 0.0–3.0)
Eosinophils Absolute: 0.1 10*3/uL (ref 0.0–0.7)
Eosinophils Relative: 1.2 % (ref 0.0–5.0)
HEMATOCRIT: 39.9 % (ref 36.0–46.0)
HEMOGLOBIN: 13.2 g/dL (ref 12.0–15.0)
LYMPHS PCT: 45.2 % (ref 12.0–46.0)
Lymphs Abs: 2.9 10*3/uL (ref 0.7–4.0)
MCHC: 33 g/dL (ref 30.0–36.0)
MCV: 88.2 fl (ref 78.0–100.0)
MONOS PCT: 8.4 % (ref 3.0–12.0)
Monocytes Absolute: 0.5 10*3/uL (ref 0.1–1.0)
NEUTROS ABS: 2.9 10*3/uL (ref 1.4–7.7)
Neutrophils Relative %: 44.8 % (ref 43.0–77.0)
PLATELETS: 218 10*3/uL (ref 150.0–400.0)
RBC: 4.53 Mil/uL (ref 3.87–5.11)
RDW: 15.1 % (ref 11.5–15.5)
WBC: 6.5 10*3/uL (ref 4.0–10.5)

## 2015-08-09 LAB — COMPREHENSIVE METABOLIC PANEL
ALBUMIN: 3.8 g/dL (ref 3.5–5.2)
ALK PHOS: 56 U/L (ref 39–117)
ALT: 21 U/L (ref 0–35)
AST: 18 U/L (ref 0–37)
BILIRUBIN TOTAL: 0.3 mg/dL (ref 0.2–1.2)
BUN: 8 mg/dL (ref 6–23)
CALCIUM: 10 mg/dL (ref 8.4–10.5)
CO2: 30 meq/L (ref 19–32)
Chloride: 99 mEq/L (ref 96–112)
Creatinine, Ser: 0.73 mg/dL (ref 0.40–1.20)
GFR: 86.79 mL/min (ref >60.00)
Glucose, Bld: 142 mg/dL — ABNORMAL HIGH (ref 70–99)
Potassium: 4.5 mEq/L (ref 3.5–5.1)
Sodium: 137 mEq/L (ref 135–145)
Total Protein: 7.2 g/dL (ref 6.0–8.3)

## 2015-08-09 LAB — LIPASE: LIPASE: 26 U/L (ref 11.0–59.0)

## 2015-08-09 MED ORDER — DICYCLOMINE HCL 20 MG PO TABS: ORAL_TABLET | ORAL | Status: DC

## 2015-08-09 NOTE — Progress Notes (Signed)
Pre visit review using our clinic review tool, if applicable. No additional management support is needed unless otherwise documented below in the visit note. 

## 2015-08-09 NOTE — Assessment & Plan Note (Signed)
Deteriorated. Advised to restart Bentyl. eRx refill sent.

## 2015-08-09 NOTE — Patient Instructions (Signed)
Great to see you. Please go to the lab and then stop by to see Vaughan Basta or [place] on your way out.  I will call you with your results.  Please restart your Bentyl.

## 2015-08-09 NOTE — Progress Notes (Signed)
Subjective:   Patient ID: Lindsey [name], female    DOB: 06-30-1955, 60 y.o.   MRN: [id number]  Lindsey [name] is a pleasant 60 y.o. year old female pt of Dr. Glori Bickers, new to me, who presents to clinic today with Diarrhea and Abdominal Pain  on [id number]  HPI: Complicated medical history.  History of IBS, was followed by Dr. Carlean Purl.  Has rx for benyl but ran out and has not been taking.  For months, has watery diarrhea immediately after eating.  Past few night, severe RUQ pain associated with nausea.  No vomiting or fevers.  No blood in stool.  Pain is better today but RUQ still feels "sore."  Current Outpatient Prescriptions on File Prior to Visit  Medication Sig Dispense Refill  . atenolol (TENORMIN) 100 MG tablet Take 1 tablet (100 mg total) by mouth daily. 90 tablet 3  . baclofen (LIORESAL) 10 MG tablet Take as directed as needed     . cholecalciferol (VITAMIN D) 1000 UNITS tablet Take 4,000 Units by mouth daily.     . DULoxetine (CYMBALTA) 60 MG capsule Take 1 capsule by mouth daily.    . fentaNYL (DURAGESIC - DOSED MCG/HR) 50 MCG/HR Place 1 patch onto the skin every other day.      Marland Kitchen HYDROcodone-acetaminophen (LORCET) 10-650 MG per tablet Take 1 tablet by mouth every 6 (six) hours as needed.      . hydrOXYzine (ATARAX) 50 MG tablet Take 50 mg by mouth 2 (two) times daily.      Marland Kitchen lidocaine (LIDODERM) 5 % Place 1 patch onto the skin daily. Remove & Discard patch within 12 hours or as directed by MD     . nitrofurantoin (MACRODANTIN) 50 MG capsule Take 50 mg by mouth daily.    . pentosan polysulfate (ELMIRON) 100 MG capsule Take two by mouth two times daily      No current facility-administered medications on file prior to visit.    Allergies  Allergen Reactions  . Buspirone Hcl     REACTION: dystonia  . Simvastatin     REACTION: malaise  . Sulfa Antibiotics     nausea    Past Medical History  Diagnosis Date  . Interstitial cystitis   . Pelvic floor dysfunction   .  Arthritis   . Vitamin D deficiency   . Chronic fatigue   . Tendonitis     hand  . Migraines   . PFO (patent foramen ovale)   . Fibromyalgia   . Hyperlipidemia   . Hypertension   . Hypersomnia   . IBS (irritable bowel syndrome)   . Hemorrhoids   . Osteopenia     Past Surgical History  Procedure Laterality Date  . Fracture surgery      ankle fracture x 2  . Breast lumpectomy      many, bilateral  . Vaginal hysterectomy  1990    because of the IC partial  . Nasal sinus surgery      multiple, x4  . Bladder surgery      and hydrodistension  . Cervical discectomy  2006    fusion and plating  . Colonoscopy    . Upper gastrointestinal endoscopy      Family History  Problem Relation Age of Onset  . Fibromyalgia Mother   . Diabetes Father   . Alcohol abuse Father   . Colon cancer Paternal Uncle   . Stomach cancer Paternal Grandfather   . Diabetes Paternal Grandmother   .  Diabetes Paternal Grandfather   . Heart disease Father   . Stroke Paternal Grandmother     Social History   Social History  . Marital Status: Married    Spouse Name: N/A  . Number of Children: 0  . Years of Education: N/A   Occupational History  . Disabled from [place] Topics  . Smoking status: Never Smoker   . Smokeless tobacco: Never Used  . Alcohol Use: No  . Drug Use: No  . Sexual Activity: Not on file   Other Topics Concern  . Not on file   Social History Narrative     Review of Systems  Constitutional: Negative.   Gastrointestinal: Positive for nausea, abdominal pain and diarrhea. Negative for vomiting, constipation, blood in stool, abdominal distention and anal bleeding.  Musculoskeletal: Negative.   Neurological: Negative.   All other systems reviewed and are negative.      Objective:    BP 128/70 mmHg  Pulse 80  Temp(Src) 98 F (36.7 C) (Oral)  Wt 161 lb 8 oz (73.256 kg)  SpO2 98%   Physical Exam  Constitutional: She is oriented to person,  place, and time. She appears well-developed and well-nourished. No distress.  HENT:  Head: Normocephalic and atraumatic.  Eyes: Conjunctivae are normal.  Cardiovascular: Normal rate.   Pulmonary/Chest: Effort normal.  Abdominal: Soft. She exhibits no distension and no mass. There is tenderness. There is guarding. There is no rebound.  Musculoskeletal: Normal range of motion.  Neurological: She is alert and oriented to person, place, and time. No cranial nerve deficit.  Skin: Skin is warm and dry. She is not diaphoretic.  Psychiatric: She has a normal mood and affect. Her behavior is normal. Judgment and thought content normal.  Nursing note and vitals reviewed.         Assessment & Plan:   RUQ pain - Plan: US Abdomen Limited RUQ, CBC with Differential/Platelet, Comprehensive metabolic panel, Lipase  Nausea without vomiting - Plan: US Abdomen Limited RUQ, CBC with Differential/Platelet, Comprehensive metabolic panel, Lipase No Follow-up on file.

## 2015-08-09 NOTE — Assessment & Plan Note (Signed)
New- with associated nausea and tenderness on exam. RUQ and labs today. The patient indicates understanding of these issues and agrees with the plan. Orders Placed This Encounter  Procedures  . US Abdomen Limited RUQ  . CBC with Differential/Platelet  . Comprehensive metabolic panel  . Lipase

## 2015-08-10 ENCOUNTER — Ambulatory Visit
Admission: RE | Admit: 2015-08-10 | Discharge: 2015-08-10 | Disposition: A | Discharge status: Home / Self Care | Payer: Medicare Other | Source: Ambulatory Visit | Attending: Family Medicine | Admitting: Family Medicine

## 2015-08-10 ENCOUNTER — Other Ambulatory Visit: Payer: Self-pay | Admitting: Family Medicine

## 2015-08-10 DIAGNOSIS — D1809 Hemangioma of other sites: Secondary | ICD-10-CM | POA: Diagnosis not present

## 2015-08-10 DIAGNOSIS — D1803 Hemangioma of intra-abdominal structures: Secondary | ICD-10-CM | POA: Insufficient documentation

## 2015-08-10 DIAGNOSIS — R110 Nausea: Secondary | ICD-10-CM | POA: Insufficient documentation

## 2015-08-10 DIAGNOSIS — R1011 Right upper quadrant pain: Secondary | ICD-10-CM | POA: Insufficient documentation

## 2015-08-10 DIAGNOSIS — K8020 Calculus of gallbladder without cholecystitis without obstruction: Secondary | ICD-10-CM | POA: Insufficient documentation

## 2015-08-17 DIAGNOSIS — K582 Mixed irritable bowel syndrome: Secondary | ICD-10-CM | POA: Diagnosis not present

## 2015-08-17 DIAGNOSIS — R5382 Chronic fatigue, unspecified: Secondary | ICD-10-CM | POA: Diagnosis not present

## 2015-08-17 DIAGNOSIS — N9489 Other specified conditions associated with female genital organs and menstrual cycle: Secondary | ICD-10-CM | POA: Diagnosis not present

## 2015-08-17 DIAGNOSIS — G43709 Chronic migraine without aura, not intractable, without status migrainosus: Secondary | ICD-10-CM | POA: Diagnosis not present

## 2015-08-17 DIAGNOSIS — N3010 Interstitial cystitis (chronic) without hematuria: Secondary | ICD-10-CM | POA: Diagnosis not present

## 2015-08-22 DIAGNOSIS — M797 Fibromyalgia: Secondary | ICD-10-CM | POA: Diagnosis not present

## 2015-08-22 DIAGNOSIS — R5381 Other malaise: Secondary | ICD-10-CM | POA: Diagnosis not present

## 2015-08-22 DIAGNOSIS — M174 Other bilateral secondary osteoarthritis of knee: Secondary | ICD-10-CM | POA: Diagnosis not present

## 2015-08-22 DIAGNOSIS — G4709 Other insomnia: Secondary | ICD-10-CM | POA: Diagnosis not present

## 2015-08-30 DIAGNOSIS — K8020 Calculus of gallbladder without cholecystitis without obstruction: Secondary | ICD-10-CM | POA: Diagnosis not present

## 2015-09-19 ENCOUNTER — Other Ambulatory Visit: Payer: Medicare Other

## 2015-09-20 ENCOUNTER — Encounter
Admission: RE | Admit: 2015-09-20 | Discharge: 2015-09-20 | Disposition: A | Discharge status: Home / Self Care | Payer: Medicare Other | Source: Ambulatory Visit | Attending: Surgery | Admitting: Surgery

## 2015-09-20 DIAGNOSIS — Z01810 Encounter for preprocedural cardiovascular examination: Secondary | ICD-10-CM | POA: Insufficient documentation

## 2015-09-20 NOTE — Patient Instructions (Signed)
  Your procedure is scheduled on: 09/26/15 Tues Report to Same Day Surgery 2nd floor medical mall To find out your arrival time please call [phone] between 1PM - 3PM on 09/25/15 Mon  Remember: Instructions that are not followed completely may result in serious medical risk, up to and including death, or upon the discretion of your surgeon and anesthesiologist your surgery may need to be rescheduled.    _x___ 1. Do not eat food or drink liquids after midnight. No gum chewing or hard candies.     __x__ 2. No Alcohol for 24 hours before or after surgery.   __x__3. No Smoking for 24 prior to surgery.   ____  4. Bring all medications with you on the day of surgery if instructed.    __x__ 5. Notify your doctor if there is any change in your medical condition     (cold, fever, infections).     Do not wear jewelry, make-up, hairpins, clips or nail polish.  Do not wear lotions, powders, or perfumes. You may wear deodorant.  Do not shave 48 hours prior to surgery. Men may shave face and neck.  Do not bring valuables to the hospital.    [facility] is not responsible for any belongings or valuables.               Contacts, dentures or bridgework may not be worn into surgery.  Leave your suitcase in the car. After surgery it may be brought to your room.  For patients admitted to the hospital, discharge time is determined by your treatment team.   Patients discharged the day of surgery will not be allowed to drive home.    Please read over the following fact sheets that you were given:   [facility] Preparing for Surgery and or MRSA Information   _x___ Take these medicines the morning of surgery with A SIP OF WATER:    1. atenolol (TENORMIN) 100 MG tablet  2.DULoxetine (CYMBALTA) 60 MG capsule  3.hydrOXYzine (ATARAX) 50 MG tablet  4.  5.  6.  ____ Fleet Enema (as directed)   _x___ Use CHG Soap or sage wipes as directed on instruction sheet   ____ Use inhalers on the day of  surgery and bring to hospital day of surgery  ____ Stop metformin 2 days prior to surgery    ____ Take 1/2 of usual insulin dose the night before surgery and none on the morning of           surgery.   ____ Stop aspirin or coumadin, or plavix  _x__ Stop Anti-inflammatories such as Advil, Aleve, Ibuprofen, Motrin, Naproxen,          Naprosyn, Goodies powders or aspirin products. Ok to take Tylenol.  Stopped Elmiron 2 weeks ago   ____ Stop supplements until after surgery.    ____ Bring C-Pap to the hospital.

## 2015-09-20 NOTE — Pre-Procedure Instructions (Addendum)
[facility] surgical for orders.  Dr [place]ell Done [place], office staff said to get chest x-ray and EKG.  Radiology will not perform chest x-ray without a signed order.  Dr Ronelle Nigh consulted regarding above mentioned, he could not justify a chest x-ray and therefore would not sign the order. Called Dr Earlie Server office, spoke with Abelardo Diesel regards to the need to do chest x-ray the morning of surgery after Dr Hassell Done signs the order or today if another physician from their office would place the order in [facility].  Instructed to do the chest x-ray the morning of surgery.

## 2015-09-25 NOTE — H&P (Signed)
Lindsey [name]. Kindschi 08/30/2015 1:47 PM Location: [facility] Patient #: [id number] DOB: Apr 30, 1955 Married / Language: Lindsey [name] / Race: White Female   History of Present Illness Lindsey Key B. Hassell Done Lindsey [name]; 08/30/2015 2:20 PM) The patient is a 60 year old female who presents for evaluation of gall stones. 60 year old WF who lives < 1 mile from [facility] has had some right upper quadrant discomfort on top of numerous GI problems including IBS and interstitial cystitis. We discussed lap chole with IOC in detail. I gave her the lap chole book and discussed complications not limited to CBD injury and bile leaks. Since she lives close to [facility] will schedule this as an outpatient there. She has not had any upper abdominal surgery but has had hysterectomy and many procedures on her bladder.    Other Problems Lindsey [name], Lindsey [name]; 08/30/2015 1:47 PM) Anxiety Disorder Arthritis Back Pain Bladder Problems Cholelithiasis Heart murmur Hemorrhoids High blood pressure Hypercholesterolemia Lump In Breast Migraine Headache Oophorectomy Bilateral. Other disease, cancer, significant illness Thyroid Disease  Past Surgical History Lindsey [name], Lindsey [name]; 08/30/2015 1:47 PM) Breast Biopsy Bilateral. multiple Foot Surgery Bilateral. Hysterectomy (not due to cancer) - Partial Oral Surgery Spinal Surgery - Neck  Diagnostic Studies History Lindsey [name], Lindsey [name]; 08/30/2015 1:47 PM) Colonoscopy 1-5 years ago Mammogram within last year Pap Smear >5 years ago  Allergies Lindsey [name], Lindsey [name]; 08/30/2015 1:49 PM) Simvastatin *ANTIHYPERLIPIDEMICS* Sulfa Antibiotics BusPIRone HCl *ANTIANXIETY AGENTS*  Medication History (Lindsey [name], Lindsey [name]; 08/30/2015 1:50 PM) Triamcinolone Acetonide (0.1% Cream, External as needed) Active. Euflexxa (20MG /2ML Soln Pref Syr, Intra-articular) Active. Atenolol (100MG  Tablet, Oral) Active. DULoxetine HCl (60MG  Capsule DR Part, Oral) Active. Elmiron (100MG  Capsule,  Oral) Active. HydrOXYzine HCl (25MG  Tablet, Oral) Active. Dicyclomine HCl (20MG  Tablet, Oral) Active. Medications Reconciled  Social History Lindsey [name], Lindsey [name]; 08/30/2015 1:47 PM) Alcohol use Occasional alcohol use. Caffeine use Carbonated beverages, Coffee, Tea. No drug use Tobacco use Never smoker.  Family History Lindsey [name], Lindsey [name]; 08/30/2015 1:47 PM) Alcohol Abuse Father. Arthritis Father. Depression Father. Diabetes Mellitus Father. Heart disease in female family member before age 41 Hypertension Father, Sister.  Pregnancy / Birth History Lindsey [name], Lindsey [name]; 08/30/2015 1:47 PM) Age at menarche 29 years. Age of menopause 42-50 Contraceptive History Intrauterine device. Gravida 0 Irregular periods Para 0    Review of Systems (Lindsey [name] Lindsey [name]; 08/30/2015 1:47 PM) General Present- Fatigue and Night Sweats. Not Present- Appetite Loss, Chills, Fever, Weight Gain and Weight Loss. Skin Present- Rash. Not Present- Change in Wart/Mole, Dryness, Hives, Jaundice, New Lesions, Non-Healing Wounds and Ulcer. HEENT Present- Seasonal Allergies, Sinus Pain, Visual Disturbances and Wears glasses/contact lenses. Not Present- Earache, Hearing Loss, Hoarseness, Nose Bleed, Oral Ulcers, Ringing in the Ears, Sore Throat and Yellow Eyes. Respiratory Present- Snoring. Not Present- Bloody sputum, Chronic Cough, Difficulty Breathing and Wheezing. Breast Not Present- Breast Mass, Breast Pain, Nipple Discharge and Skin Changes. Cardiovascular Present- Chest Pain and Leg Cramps. Not Present- Difficulty Breathing Lying Down, Palpitations, Rapid Heart Rate, Shortness of Breath and Swelling of Extremities. Gastrointestinal Present- Abdominal Pain, Bloating, Chronic diarrhea, Excessive gas, Gets full quickly at meals, Hemorrhoids, Indigestion, Nausea and Rectal Pain. Not Present- Bloody Stool, Change in Bowel Habits, Constipation, Difficulty Swallowing and Vomiting. Female Genitourinary Present-  Frequency, Painful Urination, Pelvic Pain and Urgency. Not Present- Nocturia. Musculoskeletal Present- Back Pain, Joint Pain, Joint Stiffness, Muscle Pain and Muscle Weakness. Not Present- Swelling of Extremities. Neurological Present- Decreased Memory. Not Present- Fainting, Headaches, Numbness, Seizures, Tingling, Tremor, Trouble walking and Weakness. Psychiatric Present-  Anxiety. Not Present- Bipolar, Change in Sleep Pattern, Depression, Fearful and Frequent crying. Endocrine Present- Heat Intolerance and Hot flashes. Not Present- Cold Intolerance, Excessive Hunger, Hair Changes and New Diabetes.  Vitals (Lindsey [name] Lindsey [name]; 08/30/2015 1:48 PM) 08/30/2015 1:47 PM Weight: 160 lb Height: 63in Body Surface Area: 1.76 m Body Mass Index: 28.34 kg/m  Pulse: 77 (Regular)  BP: 124/80 (Sitting, Left Arm, Standard)       Physical Exam (Lindsey [name] B. Hassell Done Lindsey [name]; 08/30/2015 2:21 PM) The physical exam findings are as follows: Note:HEENT unremarkable Neck without bruits Chest clear to auscultation Heart SR without murmurs Abdomen nontender at present. Ext FROM    Assessment & Plan Lindsey Key B. Hassell Done Lindsey [name]; 08/30/2015 2:22 PM) GALLSTONES (K80.20) Impression: Plan Lap Chole IOC at [facility] as an outpatient

## 2015-09-26 ENCOUNTER — Ambulatory Visit: Payer: Medicare Other | Admitting: Registered Nurse

## 2015-09-26 ENCOUNTER — Encounter
Admission: RE | Disposition: A | Discharge status: Home / Self Care | Payer: Self-pay | Source: Ambulatory Visit | Attending: Surgery

## 2015-09-26 ENCOUNTER — Ambulatory Visit: Payer: Medicare Other

## 2015-09-26 ENCOUNTER — Ambulatory Visit
Admission: RE | Admit: 2015-09-26 | Discharge: 2015-09-26 | Disposition: A | Discharge status: Home / Self Care | Payer: Medicare Other | Source: Ambulatory Visit | Attending: Surgery | Admitting: Surgery

## 2015-09-26 ENCOUNTER — Ambulatory Visit: Payer: Self-pay | Admitting: Surgery

## 2015-09-26 DIAGNOSIS — K811 Chronic cholecystitis: Secondary | ICD-10-CM | POA: Insufficient documentation

## 2015-09-26 DIAGNOSIS — G4730 Sleep apnea, unspecified: Secondary | ICD-10-CM | POA: Diagnosis not present

## 2015-09-26 DIAGNOSIS — R011 Cardiac murmur, unspecified: Secondary | ICD-10-CM | POA: Diagnosis not present

## 2015-09-26 DIAGNOSIS — E7800 Pure hypercholesterolemia, unspecified: Secondary | ICD-10-CM | POA: Diagnosis not present

## 2015-09-26 DIAGNOSIS — Z9049 Acquired absence of other specified parts of digestive tract: Secondary | ICD-10-CM

## 2015-09-26 DIAGNOSIS — Z419 Encounter for procedure for purposes other than remedying health state, unspecified: Secondary | ICD-10-CM

## 2015-09-26 DIAGNOSIS — K660 Peritoneal adhesions (postprocedural) (postinfection): Secondary | ICD-10-CM | POA: Insufficient documentation

## 2015-09-26 DIAGNOSIS — G43909 Migraine, unspecified, not intractable, without status migrainosus: Secondary | ICD-10-CM | POA: Diagnosis not present

## 2015-09-26 DIAGNOSIS — E079 Disorder of thyroid, unspecified: Secondary | ICD-10-CM | POA: Diagnosis not present

## 2015-09-26 DIAGNOSIS — Z01811 Encounter for preprocedural respiratory examination: Secondary | ICD-10-CM

## 2015-09-26 DIAGNOSIS — I10 Essential (primary) hypertension: Secondary | ICD-10-CM | POA: Insufficient documentation

## 2015-09-26 DIAGNOSIS — K219 Gastro-esophageal reflux disease without esophagitis: Secondary | ICD-10-CM | POA: Diagnosis not present

## 2015-09-26 DIAGNOSIS — K8020 Calculus of gallbladder without cholecystitis without obstruction: Secondary | ICD-10-CM | POA: Diagnosis not present

## 2015-09-26 DIAGNOSIS — F329 Major depressive disorder, single episode, unspecified: Secondary | ICD-10-CM | POA: Diagnosis not present

## 2015-09-26 DIAGNOSIS — Z79899 Other long term (current) drug therapy: Secondary | ICD-10-CM | POA: Insufficient documentation

## 2015-09-26 DIAGNOSIS — F419 Anxiety disorder, unspecified: Secondary | ICD-10-CM | POA: Diagnosis not present

## 2015-09-26 HISTORY — PX: CHOLECYSTECTOMY: SHX55

## 2015-09-26 LAB — GLUCOSE, CAPILLARY: GLUCOSE-CAPILLARY: 97 mg/dL (ref 65–99)

## 2015-09-26 SURGERY — LAPAROSCOPIC CHOLECYSTECTOMY WITH INTRAOPERATIVE CHOLANGIOGRAM
Anesthesia: General | Wound class: Clean Contaminated

## 2015-09-26 MED ORDER — HYDROMORPHONE HCL 1 MG/ML IJ SOLN
0.2500 mg | INTRAMUSCULAR | Status: DC | PRN
  Administered 2015-09-26 (×4): 0.5 mg via INTRAVENOUS

## 2015-09-26 MED ORDER — BUPIVACAINE LIPOSOME 1.3 % IJ SUSP
INTRAMUSCULAR | Status: DC | PRN
  Administered 2015-09-26: 20 mL

## 2015-09-26 MED ORDER — CHLORHEXIDINE GLUCONATE CLOTH 2 % EX PADS: 6.0000 | MEDICATED_PAD | Freq: Once | CUTANEOUS | Status: DC

## 2015-09-26 MED ORDER — HYDROCODONE-ACETAMINOPHEN 5-325 MG PO TABS: 1.0000 | ORAL_TABLET | ORAL | Status: DC | PRN

## 2015-09-26 MED ORDER — FENTANYL CITRATE (PF) 100 MCG/2ML IJ SOLN
25.0000 ug | INTRAMUSCULAR | Status: DC | PRN
  Administered 2015-09-26 (×4): 25 ug via INTRAVENOUS

## 2015-09-26 MED ORDER — FENTANYL CITRATE (PF) 100 MCG/2ML IJ SOLN
INTRAMUSCULAR | Status: AC
  Administered 2015-09-26: 25 ug via INTRAVENOUS
  Filled 2015-09-26: qty 2

## 2015-09-26 MED ORDER — SODIUM CHLORIDE 0.9 % IV SOLN: 250.0000 mL | INTRAVENOUS | Status: DC | PRN

## 2015-09-26 MED ORDER — HEPARIN SODIUM (PORCINE) 5000 UNIT/ML IJ SOLN
5000.0000 [IU] | Freq: Once | INTRAMUSCULAR | Status: AC
  Administered 2015-09-26: 5000 [IU] via SUBCUTANEOUS

## 2015-09-26 MED ORDER — CEFAZOLIN SODIUM-DEXTROSE 2-4 GM/100ML-% IV SOLN
INTRAVENOUS | Status: AC
  Administered 2015-09-26: 2 g via INTRAVENOUS
  Filled 2015-09-26: qty 100

## 2015-09-26 MED ORDER — ROCURONIUM BROMIDE 100 MG/10ML IV SOLN
INTRAVENOUS | Status: DC | PRN
  Administered 2015-09-26: 40 mg via INTRAVENOUS

## 2015-09-26 MED ORDER — HEPARIN SODIUM (PORCINE) 5000 UNIT/ML IJ SOLN
INTRAMUSCULAR | Status: AC
  Administered 2015-09-26: 5000 [IU] via SUBCUTANEOUS
  Filled 2015-09-26: qty 1

## 2015-09-26 MED ORDER — SODIUM CHLORIDE 0.9% FLUSH: 3.0000 mL | Freq: Two times a day (BID) | INTRAVENOUS | Status: DC

## 2015-09-26 MED ORDER — ONDANSETRON HCL 4 MG/2ML IJ SOLN: 4.0000 mg | Freq: Once | INTRAMUSCULAR | Status: DC | PRN

## 2015-09-26 MED ORDER — OXYCODONE HCL 5 MG PO TABS: 5.0000 mg | ORAL_TABLET | ORAL | Status: DC | PRN

## 2015-09-26 MED ORDER — HYDROCODONE-ACETAMINOPHEN 5-325 MG PO TABS
ORAL_TABLET | ORAL | Status: AC
  Filled 2015-09-26: qty 1

## 2015-09-26 MED ORDER — MIDAZOLAM HCL 2 MG/2ML IJ SOLN
INTRAMUSCULAR | Status: DC | PRN
  Administered 2015-09-26: 2 mg via INTRAVENOUS

## 2015-09-26 MED ORDER — HYDROMORPHONE HCL 1 MG/ML IJ SOLN
INTRAMUSCULAR | Status: AC
  Administered 2015-09-26: 0.5 mg via INTRAVENOUS
  Filled 2015-09-26: qty 1

## 2015-09-26 MED ORDER — GLYCOPYRROLATE 0.2 MG/ML IJ SOLN
INTRAMUSCULAR | Status: DC | PRN
  Administered 2015-09-26: 0.2 mg via INTRAVENOUS

## 2015-09-26 MED ORDER — DEXAMETHASONE SODIUM PHOSPHATE 10 MG/ML IJ SOLN
INTRAMUSCULAR | Status: DC | PRN
Start: 2015-09-26 — End: 2015-09-26
  Administered 2015-09-26: 10 mg via INTRAVENOUS

## 2015-09-26 MED ORDER — ACETAMINOPHEN 10 MG/ML IV SOLN
INTRAVENOUS | Status: DC | PRN
  Administered 2015-09-26: 1000 mg via INTRAVENOUS

## 2015-09-26 MED ORDER — HYDROCODONE-ACETAMINOPHEN 5-325 MG PO TABS
1.0000 | ORAL_TABLET | ORAL | Status: DC | PRN
  Administered 2015-09-26: 1 via ORAL

## 2015-09-26 MED ORDER — SODIUM CHLORIDE 0.9 % IJ SOLN
INTRAMUSCULAR | Status: AC
  Filled 2015-09-26: qty 50

## 2015-09-26 MED ORDER — FAMOTIDINE 20 MG PO TABS
20.0000 mg | ORAL_TABLET | Freq: Once | ORAL | Status: AC
  Administered 2015-09-26: 20 mg via ORAL

## 2015-09-26 MED ORDER — LIDOCAINE HCL (CARDIAC) 20 MG/ML IV SOLN
INTRAVENOUS | Status: DC | PRN
  Administered 2015-09-26: 100 mg via INTRAVENOUS

## 2015-09-26 MED ORDER — SODIUM CHLORIDE 0.9% FLUSH: 3.0000 mL | INTRAVENOUS | Status: DC | PRN

## 2015-09-26 MED ORDER — ACETAMINOPHEN 325 MG PO TABS: 650.0000 mg | ORAL_TABLET | ORAL | Status: DC | PRN

## 2015-09-26 MED ORDER — SUGAMMADEX SODIUM 200 MG/2ML IV SOLN
INTRAVENOUS | Status: DC | PRN
  Administered 2015-09-26: 150 mg via INTRAVENOUS
  Administered 2015-09-26: 145.2 mg via INTRAVENOUS

## 2015-09-26 MED ORDER — SODIUM CHLORIDE 0.9 % IV SOLN
INTRAVENOUS | Status: DC
  Administered 2015-09-26: 12:00:00 via INTRAVENOUS

## 2015-09-26 MED ORDER — FAMOTIDINE 20 MG PO TABS
ORAL_TABLET | ORAL | Status: AC
  Administered 2015-09-26: 20 mg via ORAL
  Filled 2015-09-26: qty 1

## 2015-09-26 MED ORDER — PHENYLEPHRINE HCL 10 MG/ML IJ SOLN
INTRAMUSCULAR | Status: DC | PRN
  Administered 2015-09-26: 200 ug via INTRAVENOUS
  Administered 2015-09-26 (×2): 100 ug via INTRAVENOUS
  Administered 2015-09-26: 200 ug via INTRAVENOUS

## 2015-09-26 MED ORDER — FENTANYL CITRATE (PF) 100 MCG/2ML IJ SOLN
INTRAMUSCULAR | Status: DC | PRN
  Administered 2015-09-26: 50 ug via INTRAVENOUS
  Administered 2015-09-26: 100 ug via INTRAVENOUS
  Administered 2015-09-26: 50 ug via INTRAVENOUS

## 2015-09-26 MED ORDER — SODIUM CHLORIDE 0.9 % IV SOLN
INTRAVENOUS | Status: DC | PRN
  Administered 2015-09-26: 10 mL

## 2015-09-26 MED ORDER — CEFAZOLIN SODIUM-DEXTROSE 2-4 GM/100ML-% IV SOLN
2.0000 g | INTRAVENOUS | Status: AC
  Administered 2015-09-26: 2 g via INTRAVENOUS

## 2015-09-26 MED ORDER — BUPIVACAINE HCL (PF) 0.25 % IJ SOLN
INTRAMUSCULAR | Status: AC
  Filled 2015-09-26: qty 30

## 2015-09-26 MED ORDER — ACETAMINOPHEN 10 MG/ML IV SOLN
INTRAVENOUS | Status: AC
  Filled 2015-09-26: qty 100

## 2015-09-26 MED ORDER — ACETAMINOPHEN 650 MG RE SUPP
650.0000 mg | RECTAL | Status: DC | PRN
Start: 2015-09-26 — End: 2015-09-26

## 2015-09-26 MED ORDER — ONDANSETRON HCL 4 MG/2ML IJ SOLN
INTRAMUSCULAR | Status: DC | PRN
  Administered 2015-09-26: 4 mg via INTRAVENOUS

## 2015-09-26 MED ORDER — PROPOFOL 10 MG/ML IV BOLUS
INTRAVENOUS | Status: DC | PRN
  Administered 2015-09-26: 150 mg via INTRAVENOUS

## 2015-09-26 SURGICAL SUPPLY — 40 items
APPLIER CLIP ROT 10 11.4 M/L (STAPLE) ×3
APR CLP MED LRG 11.4X10 (STAPLE) ×1
CANISTER SUCT 1200ML W/VALVE (MISCELLANEOUS) ×3 IMPLANT
CATH REDDICK CHOLANGI 4FR 50CM (CATHETERS) ×3 IMPLANT
CHLORAPREP W/TINT 26ML (MISCELLANEOUS) ×3 IMPLANT
CLIP APPLIE ROT 10 11.4 M/L (STAPLE) ×1 IMPLANT
DRAPE C-ARM XRAY 36X54 (DRAPES) ×3 IMPLANT
ELECT REM PT RETURN 9FT ADLT (ELECTROSURGICAL) ×3
ELECTRODE REM PT RTRN 9FT ADLT (ELECTROSURGICAL) ×1 IMPLANT
ENDOPOUCH RETRIEVER 10 (MISCELLANEOUS) ×3 IMPLANT
GLOVE BIO SURGEON STRL SZ8 (GLOVE) ×3 IMPLANT
GOWN STRL REUS W/ TWL LRG LVL3 (GOWN DISPOSABLE) ×2 IMPLANT
GOWN STRL REUS W/TWL 2XL LVL3 (GOWN DISPOSABLE) ×3 IMPLANT
GOWN STRL REUS W/TWL LRG LVL3 (GOWN DISPOSABLE) ×6
IRRIGATION STRYKERFLOW (MISCELLANEOUS) ×1 IMPLANT
IRRIGATOR STRYKERFLOW (MISCELLANEOUS) ×3
IV CATH ANGIO 12GX3 LT BLUE (NEEDLE) ×2 IMPLANT
IV CATH ANGIO 14GX1.88 NO SAFE (IV SOLUTION) ×3 IMPLANT
IV NS 1000ML (IV SOLUTION) ×3
IV NS 1000ML BAXH (IV SOLUTION) ×1 IMPLANT
KIT PINK PAD W/HEAD ARE REST (MISCELLANEOUS) ×3
KIT PINK PAD W/HEAD ARM REST (MISCELLANEOUS) ×1 IMPLANT
KIT RM TURNOVER STRD PROC AR (KITS) ×3 IMPLANT
L-HOOK LAP DISP 36CM (ELECTROSURGICAL) ×3
LHOOK LAP DISP 36CM (ELECTROSURGICAL) ×1 IMPLANT
LIQUID BAND (GAUZE/BANDAGES/DRESSINGS) ×3 IMPLANT
NEEDLE HYPO 22GX1.5 SAFETY (NEEDLE) ×3 IMPLANT
NS IRRIG 1000ML POUR BTL (IV SOLUTION) ×3 IMPLANT
NS IRRIG 500ML POUR BTL (IV SOLUTION) ×3 IMPLANT
PENCIL ELECTRO HAND CTR (MISCELLANEOUS) ×3 IMPLANT
SCISSORS METZENBAUM CVD 33 (INSTRUMENTS) ×3 IMPLANT
SLEEVE ENDOPATH XCEL 5M (ENDOMECHANICALS) ×6 IMPLANT
SUT VIC AB 4-0 SH 27 (SUTURE) ×3
SUT VIC AB 4-0 SH 27XANBCTRL (SUTURE) ×1 IMPLANT
SUT VICRYL 0 AB UR-6 (SUTURE) ×3 IMPLANT
TOWEL OR 17X26 4PK STRL BLUE (TOWEL DISPOSABLE) ×3 IMPLANT
TROCAR XCEL BLUNT TIP 100MML (ENDOMECHANICALS) ×3 IMPLANT
TROCAR XCEL NON-BLD 11X100MML (ENDOMECHANICALS) ×3 IMPLANT
TROCAR XCEL NON-BLD 5MMX100MML (ENDOMECHANICALS) ×3 IMPLANT
TUBING INSUFFLATOR HI FLOW (MISCELLANEOUS) ×3 IMPLANT

## 2015-09-26 NOTE — Anesthesia Procedure Notes (Signed)
Procedure Name: Intubation Date/Time: 09/26/2015 12:44 PM Performed by: Doreen Salvage Pre-anesthesia Checklist: Patient identified, Patient being monitored, Timeout performed, Emergency Drugs available and Suction available Patient Re-evaluated:Patient Re-evaluated prior to inductionOxygen Delivery Method: Circle system utilized Preoxygenation: Pre-oxygenation with 100% oxygen Intubation Type: IV induction Ventilation: Mask ventilation without difficulty Laryngoscope Size: Mac, 3 and McGraph Grade View: Grade I Tube type: Oral Tube size: 7.0 mm Number of attempts: 2 Airway Equipment and Method: Stylet Placement Confirmation: ETT inserted through vocal cords under direct vision,  positive ETCO2 and breath sounds checked- equal and bilateral Secured at: 21 cm Tube secured with: Tape Dental Injury: Teeth and Oropharynx as per pre-operative assessment  Comments: Anterior airway. Grade 4 view with DL + cricoid pressure. Grade 1 view with MAC 3 mcgrath

## 2015-09-26 NOTE — Transfer of Care (Signed)
Immediate Anesthesia Transfer of Care Note  Patient: Lindsey [name]  Procedure(s) Performed: Procedure(s): LAPAROSCOPIC CHOLECYSTECTOMY WITH INTRAOPERATIVE CHOLANGIOGRAM (N/A)  Patient Location: PACU  Anesthesia Type:General  Level of Consciousness: sedated  Airway & Oxygen Therapy: Patient Spontanous Breathing and Patient connected to face mask oxygen  Post-op Assessment: Report given to RN and Post -op Vital signs reviewed and stable  Post vital signs: Reviewed and stable  Last Vitals:  Filed Vitals:   09/26/15 1124 09/26/15 1415  BP: 111/77 131/75  Pulse: 52 79  Temp: 36.2 C 36.5 C  Resp: 16 12    Complications: No apparent anesthesia complications

## 2015-09-26 NOTE — Interval H&P Note (Signed)
History and Physical Interval Note:  09/26/2015 12:01 PM  Lindsey [name]  has presented today for surgery, with the diagnosis of Gallstones  The various methods of treatment have been discussed with the patient and family. After consideration of risks, benefits and other options for treatment, the patient has consented to  Procedure(s): LAPAROSCOPIC CHOLECYSTECTOMY WITH INTRAOPERATIVE CHOLANGIOGRAM (N/A) as a surgical intervention .  The patient's history has been reviewed, patient examined, no change in status, stable for surgery.  I have reviewed the patient's chart and labs.  Questions were answered to the patient's satisfaction.     Jlyn Cerros B

## 2015-09-26 NOTE — Op Note (Signed)
Lindsey [name]   09/26/2015  2:02 PM  Procedure: Laparoscopic Cholecystectomy with intraoperative cholangiogram  Surgeon: Catalina Antigua B. Hassell Done, MD, FACS Asst:  Greer Pickerel, MD,FACS  Anes:  General  Drains:  None  Findings: Chronic cholecystitis;  Large CBD with free flow into the duodenum  Description of Procedure: The patient was taken to OR 9 at [facility] and given general anesthesia.  The patient was prepped with chlorohexidine and draped sterilely. A time out was performed.  Access to the abdomen was achieved with a 5 mm Optiview through the umbilicus.  Port placement included three 5 mm trocars and an 11 mm in the upper midline.    The gallbladder was visualized and the fundus was grasped and the gallbladder was elevated. Traction on the infundibulum allowed for successful demonstration of the critical view. Inflammatory changes were chronic and included omentum stuck up on the fundus as well as adhesions between the infundibulum and the duodenum.  The cystic duct was identified and clipped up on the gallbladder and an incision was made in the cystic duct and the Reddick catheter was inserted after milking the cystic duct of any debris. A dynamic cholangiogram was performed which demonstrated a large CBD and free flow into the duodenum with intrahepatic radicals visualized.    The cystic duct was then triple clipped and divided, the cystic artery was double clipped and divided and then the gallbladder was removed from the gallbladder bed. Removal of the gallbladder from the gallbladder bed was performed with the hook electrocautery.  The gallbladder was then placed in a bag and brought out through one of the trocar sites. The gallbladder bed was inspected and no bleeding or bile leaks were seen.   Laparoscopic visualization was used when injecting the trocar sites.   Incisions were injected with Exparel 20 cc and closed with 4-0 Vicryl and Liquiban on the skin.  Sponge and needle count were correct.     The patient was taken to the recovery room in satisfactory condition.

## 2015-09-26 NOTE — Anesthesia Postprocedure Evaluation (Signed)
Anesthesia Post Note  Patient: Lindsey [name]  Procedure(s) Performed: Procedure(s) (LRB): LAPAROSCOPIC CHOLECYSTECTOMY WITH INTRAOPERATIVE CHOLANGIOGRAM (N/A)  Patient location during evaluation: PACU Anesthesia Type: General Level of consciousness: awake Pain management: pain level controlled Vital Signs Assessment: post-procedure vital signs reviewed and stable Respiratory status: spontaneous breathing Cardiovascular status: stable Anesthetic complications: no    Last Vitals:  Filed Vitals:   09/26/15 1124 09/26/15 1415  BP: 111/77 131/75  Pulse: 52 79  Temp: 36.2 C 36.5 C  Resp: 16 12    Last Pain:  Filed Vitals:   09/26/15 1421  PainSc: Asleep                 VAN STAVEREN,Isam Unrein

## 2015-09-26 NOTE — Anesthesia Preprocedure Evaluation (Signed)
Anesthesia Evaluation  Patient identified by MRN, date of birth, ID band Patient awake    Reviewed: Allergy & Precautions, NPO status , Patient's Chart, lab work & pertinent test results  Airway Mallampati: III       Dental  (+) Teeth Intact   Pulmonary sleep apnea ,    breath sounds clear to auscultation       Cardiovascular Exercise Tolerance: Good hypertension, Pt. on home beta blockers  Rhythm:Regular  Persitent PFO   Neuro/Psych  Headaches, Depression    GI/Hepatic Neg liver ROS, GERD  Medicated,  Endo/Other    Renal/GU      Musculoskeletal   Abdominal Normal abdominal exam  (+)   Peds  Hematology negative hematology ROS (+)   Anesthesia Other Findings   Reproductive/Obstetrics                             Anesthesia Physical Anesthesia Plan  ASA: II  Anesthesia Plan: General   Post-op Pain Management:    Induction: Intravenous  Airway Management Planned: Oral ETT  Additional Equipment:   Intra-op Plan:   Post-operative Plan: Extubation in OR  Informed Consent:   Plan Discussed with:   Anesthesia Plan Comments:         Anesthesia Quick Evaluation

## 2015-09-26 NOTE — Discharge Instructions (Signed)
Laparoscopic Cholecystectomy Laparoscopic cholecystectomy is surgery to remove the gallbladder. The gallbladder is located in the upper right part of the abdomen, behind the liver. It is a storage sac for bile, which is produced in the liver. Bile aids in the digestion and absorption of fats. Cholecystectomy is often done for inflammation of the gallbladder (cholecystitis). This condition is usually caused by a buildup of gallstones (cholelithiasis) in the gallbladder. Gallstones can block the flow of bile, and that can result in inflammation and pain. In severe cases, emergency surgery may be required. If emergency surgery is not required, you will have time to prepare for the procedure. Laparoscopic surgery is an alternative to open surgery. Laparoscopic surgery has a shorter recovery time. Your common bile duct may also need to be examined during the procedure. If stones are found in the common bile duct, they may be removed. LET [facility] CARE PROVIDER KNOW ABOUT:  Any allergies you have.  All medicines you are taking, including vitamins, herbs, eye drops, creams, and over-the-counter medicines.  Previous problems you or members of your family have had with the use of anesthetics.  Any blood disorders you have.  Previous surgeries you have had.  Any medical conditions you have. RISKS AND COMPLICATIONS Generally, this is a safe procedure. However, problems may occur, including:  Infection.  Bleeding.  Allergic reactions to medicines.  Damage to other structures or organs.  A stone remaining in the common bile duct.  A bile leak from the cyst duct that is clipped when your gallbladder is removed.  The need to convert to open surgery, which requires a larger incision in the abdomen. This may be necessary if your surgeon thinks that it is not safe to continue with a laparoscopic procedure. BEFORE THE PROCEDURE  Ask your health care provider about:  Changing or stopping your  regular medicines. This is especially important if you are taking diabetes medicines or blood thinners.  Taking medicines such as aspirin and ibuprofen. These medicines can thin your blood. Do not take these medicines before your procedure if your health care provider instructs you not to.  Follow instructions from your health care provider about eating or drinking restrictions.  Let your health care provider know if you develop a cold or an infection before surgery.  Plan to have someone take you home after the procedure.  Ask your health care provider how your surgical site will be marked or identified.  You may be given antibiotic medicine to help prevent infection. PROCEDURE  To reduce your risk of infection:  Your health care team will wash or sanitize their hands.  Your skin will be washed with soap.  An IV tube may be inserted into one of your veins.  You will be given a medicine to make you fall asleep (general anesthetic).  A breathing tube will be placed in your mouth.  The surgeon will make several small cuts (incisions) in your abdomen.  A thin, lighted tube (laparoscope) that has a tiny camera on the end will be inserted through one of the small incisions. The camera on the laparoscope will send a picture to a TV screen (monitor) in the operating room. This will give the surgeon a good view inside your abdomen.  A gas will be pumped into your abdomen. This will expand your abdomen to give the surgeon more room to perform the surgery.  Other tools that are needed for the procedure will be inserted through the other incisions. The gallbladder will  be removed through one of the incisions.  After your gallbladder has been removed, the incisions will be closed with stitches (sutures), staples, or skin glue.  Your incisions may be covered with a bandage (dressing). The procedure may vary among health care providers and hospitals. AFTER THE PROCEDURE  Your blood  pressure, heart rate, breathing rate, and blood oxygen level will be monitored often until the medicines you were given have worn off.  You will be given medicines as needed to control your pain.   This information is not intended to replace advice given to you by your health care provider. Make sure you discuss any questions you have with your health care provider.   Document Released: 03/18/2005 Document Revised: 12/07/2014 Document Reviewed: 10/28/2012 Elsevier Interactive Patient Education 2016 [place] may shower tomorrow.  Advance diet from liquids to solids as tolerated.    AMBULATORY SURGERY  DISCHARGE INSTRUCTIONS   1) The drugs that you were given will stay in your system until tomorrow so for the next 24 hours you should not:  A) Drive an automobile B) Make any legal decisions C) Drink any alcoholic beverage   2) You may resume regular meals tomorrow.  Today it is better to start with liquids and gradually work up to solid foods.  You may eat anything you prefer, but it is better to start with liquids, then soup and crackers, and gradually work up to solid foods.   3) Please notify your doctor immediately if you have any unusual bleeding, trouble breathing, redness and pain at the surgery site, drainage, fever, or pain not relieved by medication.    4) Additional Instructions:   Please contact your physician with any problems or Same Day Surgery at [phone], Monday through Friday 6 am to 4 pm, or [place] at [facility] number at [phone].

## 2015-09-27 ENCOUNTER — Encounter: Payer: Self-pay | Admitting: Surgery

## 2015-09-28 LAB — SURGICAL PATHOLOGY

## 2015-10-26 DIAGNOSIS — G43719 Chronic migraine without aura, intractable, without status migrainosus: Secondary | ICD-10-CM | POA: Diagnosis not present

## 2015-10-29 ENCOUNTER — Other Ambulatory Visit: Payer: Self-pay | Admitting: Family Medicine

## 2015-10-30 ENCOUNTER — Other Ambulatory Visit: Payer: Self-pay | Admitting: Family Medicine

## 2015-10-30 DIAGNOSIS — Z1231 Encounter for screening mammogram for malignant neoplasm of breast: Secondary | ICD-10-CM

## 2015-10-30 NOTE — Telephone Encounter (Signed)
done

## 2015-10-30 NOTE — Telephone Encounter (Signed)
Pt had an acute appt in Jan but no recent f/u or CPE appts, please advise

## 2015-10-30 NOTE — Telephone Encounter (Signed)
Please refill for 6 mo 

## 2015-11-06 DIAGNOSIS — M1711 Unilateral primary osteoarthritis, right knee: Secondary | ICD-10-CM | POA: Diagnosis not present

## 2015-11-06 DIAGNOSIS — M1712 Unilateral primary osteoarthritis, left knee: Secondary | ICD-10-CM | POA: Diagnosis not present

## 2015-11-13 DIAGNOSIS — M1712 Unilateral primary osteoarthritis, left knee: Secondary | ICD-10-CM | POA: Diagnosis not present

## 2015-11-13 DIAGNOSIS — M1711 Unilateral primary osteoarthritis, right knee: Secondary | ICD-10-CM | POA: Diagnosis not present

## 2015-11-21 DIAGNOSIS — M1711 Unilateral primary osteoarthritis, right knee: Secondary | ICD-10-CM | POA: Diagnosis not present

## 2015-11-21 DIAGNOSIS — M1712 Unilateral primary osteoarthritis, left knee: Secondary | ICD-10-CM | POA: Diagnosis not present

## 2015-11-23 DIAGNOSIS — G8929 Other chronic pain: Secondary | ICD-10-CM | POA: Diagnosis not present

## 2015-11-23 DIAGNOSIS — R350 Frequency of micturition: Secondary | ICD-10-CM | POA: Diagnosis not present

## 2015-11-23 DIAGNOSIS — N3010 Interstitial cystitis (chronic) without hematuria: Secondary | ICD-10-CM | POA: Diagnosis not present

## 2015-11-23 DIAGNOSIS — R3915 Urgency of urination: Secondary | ICD-10-CM | POA: Diagnosis not present

## 2015-12-05 ENCOUNTER — Ambulatory Visit
Admission: RE | Admit: 2015-12-05 | Discharge: 2015-12-05 | Disposition: A | Discharge status: Home / Self Care | Payer: Medicare Other | Source: Ambulatory Visit | Attending: Family Medicine | Admitting: Family Medicine

## 2015-12-05 DIAGNOSIS — Z1231 Encounter for screening mammogram for malignant neoplasm of breast: Secondary | ICD-10-CM

## 2015-12-26 DIAGNOSIS — N9489 Other specified conditions associated with female genital organs and menstrual cycle: Secondary | ICD-10-CM | POA: Diagnosis not present

## 2015-12-26 DIAGNOSIS — K582 Mixed irritable bowel syndrome: Secondary | ICD-10-CM | POA: Diagnosis not present

## 2015-12-26 DIAGNOSIS — N3010 Interstitial cystitis (chronic) without hematuria: Secondary | ICD-10-CM | POA: Diagnosis not present

## 2015-12-26 DIAGNOSIS — R5382 Chronic fatigue, unspecified: Secondary | ICD-10-CM | POA: Diagnosis not present

## 2015-12-26 DIAGNOSIS — G43709 Chronic migraine without aura, not intractable, without status migrainosus: Secondary | ICD-10-CM | POA: Diagnosis not present

## 2016-01-23 ENCOUNTER — Ambulatory Visit: Payer: Self-pay | Admitting: Rheumatology

## 2016-01-25 DIAGNOSIS — G43719 Chronic migraine without aura, intractable, without status migrainosus: Secondary | ICD-10-CM | POA: Diagnosis not present

## 2016-02-15 DIAGNOSIS — M179 Osteoarthritis of knee, unspecified: Secondary | ICD-10-CM | POA: Insufficient documentation

## 2016-02-15 DIAGNOSIS — M1710 Unilateral primary osteoarthritis, unspecified knee: Secondary | ICD-10-CM | POA: Insufficient documentation

## 2016-02-15 NOTE — Progress Notes (Signed)
Office Visit Note  Patient: Lindsey [name]             Date of Birth: 1955/06/28           MRN: [id number]             PCP: [name], MD Referring: Tower, Wynelle Fanny, MD Visit Date: 02/20/2016 Occupation: @GUAROCC @    Subjective:  No chief complaint on file. Fibromyalgia, fatigue, insomnia.    History of Present Illness: Lindsey [name] is a 60 y.o. female   Last seen on 08/22/2015.  Patient is having interstitial cystitis flare at the moment.  Her fibromyalgia is rated 4 on a scale of 0-10 with fatigue rated 8 on a scale of 0-10.  She's having pain all over today.  She has a history of osteoarthritis of the knee joint and we gave for Euflexxa 3 to bilateral knee joint the patient received no benefit from this medication. As a result we advised the patient that we don't want to repeat this unless she is getting relief.  She is having trouble sleeping at night and we discussed using Flexeril. She is agreeable and she will try half a tablet to 1 tablet daily at bedtime. She'll use when necessary.  Her right SI joint is very painful and I'll give her cortisone injection for this and she is agreeable. Because of blood pressure is elevated today only give her injection in the right SI joint. Patient is agreeable. Next she needs a refill on Cymbalta. She takes 60 mg daily and she does well with the medication   Activities of Daily Living:  Patient reports morning stiffness for 30 minutes.   Patient Reports nocturnal pain.  Difficulty dressing/grooming: Reports Difficulty climbing stairs: Reports Difficulty getting out of chair: Reports Difficulty using hands for taps, buttons, cutlery, and/or writing: Reports   Review of Systems  Constitutional: Positive for fatigue.  HENT: Negative for mouth sores and mouth dryness.   Eyes: Negative for dryness.  Respiratory: Negative for shortness of breath.   Gastrointestinal: Negative for constipation and diarrhea.  Musculoskeletal:  Positive for myalgias and myalgias.  Skin: Negative for sensitivity to sunlight.  Psychiatric/Behavioral: Positive for sleep disturbance. Negative for decreased concentration.    PMFS History:  Patient Active Problem List   Diagnosis Date Noted  . Osteoarthritis, knee 02/15/2016  . S/P laparoscopic cholecystectomy June 2017 09/26/2015  . RUQ pain 08/09/2015  . Nausea without vomiting 08/09/2015  . Dermatitis 10/31/2014  . Hyperglycemia 12/24/2013  . Acute pyelonephritis 09/23/2013  . Encounter for Medicare annual wellness exam 09/04/2012  . Low TSH level 09/04/2012  . Skin lesion 08/19/2012  . HSV (herpes simplex virus) infection 04/08/2012  . IBS (irritable bowel syndrome) 10/21/2011  . Osteopenia 05/17/2009  . DEPRESSION 10/05/2008  . GERD 05/05/2008  . OBSTRUCTIVE SLEEP APNEA 06/25/2007  . Hyperlipidemia 05/31/2007  . Essential hypertension 05/31/2007  . Other fatigue 04/23/2007  . Vitamin D deficiency 01/22/2007  . MIGRAINE HEADACHE 01/22/2007  . CYSTITIS, CHRONIC INTERSTITIAL 01/22/2007  . MENOPAUSE-RELATED VASOMOTOR SYMPTOMS 01/22/2007  . OSTEOARTHROSIS, GENERALIZED, MULTIPLE SITES 01/22/2007  . Fibromyalgia 01/22/2007  . PATENT FORAMEN OVALE 01/22/2007    Past Medical History:  Diagnosis Date  . Arthritis   . Chronic fatigue   . Fibromyalgia   . Hemorrhoids   . Hyperlipidemia   . Hypersomnia   . Hypertension   . IBS (irritable bowel syndrome)   . Interstitial cystitis   . Migraines   . Osteopenia   .  Pelvic floor dysfunction   . PFO (patent foramen ovale)   . Tendonitis    hand  . Vitamin D deficiency     Family History  Problem Relation Age of Onset  . Diabetes Father   . Alcohol abuse Father   . Heart disease Father   . Fibromyalgia Mother   . Colon cancer Paternal Uncle   . Stomach cancer Paternal Grandfather   . Diabetes Paternal Grandfather   . Diabetes Paternal Grandmother   . Stroke Paternal Grandmother    Past Surgical History:    Procedure Laterality Date  . ANKLE ARTHROSCOPY Right   . ANKLE FRACTURE SURGERY Left   . BLADDER SURGERY     and hydrodistension  . BREAST LUMPECTOMY     many, bilateral  . CERVICAL DISCECTOMY  2006   fusion and plating  . CHOLECYSTECTOMY N/A 09/26/2015   Procedure: LAPAROSCOPIC CHOLECYSTECTOMY WITH INTRAOPERATIVE CHOLANGIOGRAM;  Surgeon: Johnathan Hausen, MD;  Location: [facility] ORS;  Service: General;  Laterality: N/A;  . COLONOSCOPY    . FRACTURE SURGERY     ankle fracture x 2  . NASAL SINUS SURGERY     multiple, x4  . UPPER GASTROINTESTINAL ENDOSCOPY    . VAGINAL HYSTERECTOMY  1990   because of the IC partial   Social History   Social History Narrative  . No narrative on file     Objective: Vital Signs: BP (!) 139/91 (BP Location: Right Arm, Patient Position: Sitting, Cuff Size: Large)   Pulse 61   Resp 13   Ht 5\' 3"  (1.6 m)   Wt 170 lb (77.1 kg)   BMI 30.11 kg/m    Physical Exam  Constitutional: She is oriented to person, place, and time. She appears well-developed and well-nourished.  HENT:  Head: Normocephalic and atraumatic.  Eyes: EOM are normal. Pupils are equal, round, and reactive to light.  Cardiovascular: Normal rate, regular rhythm and normal heart sounds.  Exam reveals no gallop and no friction rub.   No murmur heard. Pulmonary/Chest: Effort normal and breath sounds normal. She has no wheezes. She has no rales.  Abdominal: Soft. Bowel sounds are normal. She exhibits no distension. There is no tenderness. There is no guarding. No hernia.  Musculoskeletal: Normal range of motion. She exhibits no edema, tenderness or deformity.  Lymphadenopathy:    She has no cervical adenopathy.  Neurological: She is alert and oriented to person, place, and time. Coordination normal.  Skin: Skin is warm and dry. Capillary refill takes less than 2 seconds. No rash noted.  Psychiatric: She has a normal mood and affect. Her behavior is normal.     Musculoskeletal Exam:   Full range of motion of all joints Grip strength is equal and strong bilaterally Fiber myalgia tender points are all absent  CDAI Exam: CDAI Homunculus Exam:   Joint Counts:  CDAI Tender Joint count: 0 CDAI Swollen Joint count: 0  Global Assessments:  Patient Global Assessment: 4 Provider Global Assessment: 4  CDAI Calculated Score: 8    Investigation: No additional findings.   Imaging: No results found.  Speciality Comments: No specialty comments available.    Procedures:  Large Joint Inj Date/Time: 02/20/2016 2:14 PM Performed by: Eliezer Lofts Authorized by: Eliezer Lofts   Consent Given by:  Patient Site marked: the procedure site was marked   Timeout: prior to procedure the correct patient, procedure, and site was verified   Indications:  Pain Location:  Sacroiliac Site:  R sacroiliac joint Prep: patient was  prepped and draped in usual sterile fashion   Needle Size:  27 G Needle Length:  1.5 inches Approach:  Superior Ultrasound Guidance: No   Fluoroscopic Guidance: No   Arthrogram: No   Medications:  1 mL lidocaine 1 %; 40 mg triamcinolone acetonide 40 MG/ML Aspiration Attempted: Yes   Aspirate amount (mL):  0 Patient tolerance:  Patient tolerated the procedure well with no immediate complications  Patient's right SI joint has been very painful over the last week or 2. Causing patient difficulty in moving and resting. Rates the pain as 7 on a scale of 0-10 After informed consent was obtained the site was prepped in usual sterile fashion and injected with one half mL's 1% lidocaine without epinephrine mixed with 40 mg of Kenalog. Patient tolerated procedure well. There were no complications.   Allergies: Buspirone hcl; Simvastatin; and Sulfa antibiotics   Assessment / Plan: Visit Diagnoses: Fibromyalgia  Other fatigue  Primary osteoarthritis of both knees    Orders: Orders Placed This Encounter  Procedures  . Large Joint  Injection/Arthrocentesis   No orders of the defined types were placed in this encounter.   Face-to-face time spent with patient was 30 minutes. 50% of time was spent in counseling and coordination of care.  Follow-Up Instructions: No Follow-up on file.   Eliezer Lofts, PA-C   I examined and evaluated the patient with Eliezer Lofts PA. The plan of care was discussed as noted above.  Bo Merino, MD

## 2016-02-19 ENCOUNTER — Encounter: Payer: Self-pay | Admitting: *Deleted

## 2016-02-20 ENCOUNTER — Ambulatory Visit (INDEPENDENT_AMBULATORY_CARE_PROVIDER_SITE_OTHER): Payer: Medicare Other | Admitting: Rheumatology

## 2016-02-20 ENCOUNTER — Encounter: Payer: Self-pay | Admitting: Rheumatology

## 2016-02-20 VITALS — BP 139/91 | HR 61 | Resp 13 | Ht 63.0 in | Wt 170.0 lb

## 2016-02-20 DIAGNOSIS — M170 Bilateral primary osteoarthritis of knee: Secondary | ICD-10-CM

## 2016-02-20 DIAGNOSIS — M461 Sacroiliitis, not elsewhere classified: Secondary | ICD-10-CM | POA: Diagnosis not present

## 2016-02-20 DIAGNOSIS — M797 Fibromyalgia: Secondary | ICD-10-CM

## 2016-02-20 DIAGNOSIS — R5383 Other fatigue: Secondary | ICD-10-CM

## 2016-02-20 DIAGNOSIS — N3010 Interstitial cystitis (chronic) without hematuria: Secondary | ICD-10-CM | POA: Diagnosis not present

## 2016-02-20 MED ORDER — CYCLOBENZAPRINE HCL 10 MG PO TABS: 10.0000 mg | ORAL_TABLET | Freq: Every day | ORAL | 5 refills | Status: AC

## 2016-02-20 MED ORDER — LIDOCAINE HCL 1 % IJ SOLN
1.0000 mL | INTRAMUSCULAR | Status: AC | PRN
  Administered 2016-02-20: 1 mL

## 2016-02-20 MED ORDER — DULOXETINE HCL 60 MG PO CPEP: 60.0000 mg | ORAL_CAPSULE | Freq: Every day | ORAL | 1 refills | Status: DC

## 2016-02-20 MED ORDER — TRIAMCINOLONE ACETONIDE 40 MG/ML IJ SUSP
40.0000 mg | INTRAMUSCULAR | Status: AC | PRN
  Administered 2016-02-20: 40 mg via INTRA_ARTICULAR

## 2016-03-19 DIAGNOSIS — N3010 Interstitial cystitis (chronic) without hematuria: Secondary | ICD-10-CM | POA: Diagnosis not present

## 2016-03-19 DIAGNOSIS — N9489 Other specified conditions associated with female genital organs and menstrual cycle: Secondary | ICD-10-CM | POA: Diagnosis not present

## 2016-03-19 DIAGNOSIS — R5382 Chronic fatigue, unspecified: Secondary | ICD-10-CM | POA: Diagnosis not present

## 2016-03-19 DIAGNOSIS — R1030 Lower abdominal pain, unspecified: Secondary | ICD-10-CM | POA: Diagnosis not present

## 2016-03-19 DIAGNOSIS — G43709 Chronic migraine without aura, not intractable, without status migrainosus: Secondary | ICD-10-CM | POA: Diagnosis not present

## 2016-04-04 ENCOUNTER — Ambulatory Visit (INDEPENDENT_AMBULATORY_CARE_PROVIDER_SITE_OTHER): Payer: Medicare Other | Admitting: Internal Medicine

## 2016-04-04 ENCOUNTER — Telehealth: Payer: Self-pay

## 2016-04-04 ENCOUNTER — Encounter: Payer: Self-pay | Admitting: Internal Medicine

## 2016-04-04 VITALS — BP 122/80 | HR 65 | Temp 98.0°F | Wt 169.0 lb

## 2016-04-04 DIAGNOSIS — N3000 Acute cystitis without hematuria: Secondary | ICD-10-CM | POA: Diagnosis not present

## 2016-04-04 DIAGNOSIS — R300 Dysuria: Secondary | ICD-10-CM | POA: Diagnosis not present

## 2016-04-04 DIAGNOSIS — R102 Pelvic and perineal pain: Secondary | ICD-10-CM

## 2016-04-04 LAB — POC URINALSYSI DIPSTICK (AUTOMATED)
BILIRUBIN UA: NEGATIVE
GLUCOSE UA: NEGATIVE
Ketones, UA: NEGATIVE
NITRITE UA: NEGATIVE
PH UA: 5.5
Protein, UA: NEGATIVE
RBC UA: NEGATIVE
Spec Grav, UA: 1.02
Urobilinogen, UA: NEGATIVE

## 2016-04-04 MED ORDER — CIPROFLOXACIN HCL 500 MG PO TABS: 500.0000 mg | ORAL_TABLET | Freq: Two times a day (BID) | ORAL | 0 refills | Status: DC

## 2016-04-04 NOTE — Patient Instructions (Signed)
Urinary Tract Infection, Adult Introduction A urinary tract infection (UTI) is an infection of any part of the urinary tract. The urinary tract includes the:  Kidneys.  Ureters.  Bladder.  Urethra. These organs make, store, and get rid of pee (urine) in the body. Follow these instructions at home:  Take over-the-counter and prescription medicines only as told by your doctor.  If you were prescribed an antibiotic medicine, take it as told by your doctor. Do not stop taking the antibiotic even if you start to feel better.  Avoid the following drinks:  Alcohol.  Caffeine.  Tea.  Carbonated drinks.  Drink enough fluid to keep your pee clear or pale yellow.  Keep all follow-up visits as told by your doctor. This is important.  Make sure to:  Empty your bladder often and completely. Do not to hold pee for long periods of time.  Empty your bladder before and after sex.  Wipe from front to back after a bowel movement if you are female. Use each tissue one time when you wipe. Contact a doctor if:  You have back pain.  You have a fever.  You feel sick to your stomach (nauseous).  You throw up (vomit).  Your symptoms do not get better after 3 days.  Your symptoms go away and then come back. Get help right away if:  You have very bad back pain.  You have very bad lower belly (abdominal) pain.  You are throwing up and cannot keep down any medicines or water. This information is not intended to replace advice given to you by your health care provider. Make sure you discuss any questions you have with your health care provider. Document Released: 09/04/2007 Document Revised: 08/24/2015 Document Reviewed: 02/06/2015  2017 Elsevier  

## 2016-04-04 NOTE — Telephone Encounter (Signed)
Per verbal okay from Dr Silvio Pate....Ok for pt to take medication as instructed--pt has taken medication before listed in medication history... I spoke to [place] with [facility] and he is aware

## 2016-04-04 NOTE — Progress Notes (Signed)
HPI  Pt presents to the clinic today with c/o pelvic pain, dysuria and left pain. This started 3-4 days ago. She denies urgency, frequency or blood in her urine. She denies fever, chills or nausea. She has been taking Macrobid daily for UTI prophylaxis secondary to recurrent UTI's. She also has a history of interstitial cystitis.   Review of Systems  Past Medical History:  Diagnosis Date  . Arthritis   . Chronic fatigue   . Fibromyalgia   . Hemorrhoids   . Hyperlipidemia   . Hypersomnia   . Hypertension   . IBS (irritable bowel syndrome)   . Interstitial cystitis   . Migraines   . Osteopenia   . Pelvic floor dysfunction   . PFO (patent foramen ovale)   . Tendonitis    hand  . Vitamin D deficiency     Family History  Problem Relation Age of Onset  . Diabetes Father   . Alcohol abuse Father   . Heart disease Father   . Fibromyalgia Mother   . Colon cancer Paternal Uncle   . Stomach cancer Paternal Grandfather   . Diabetes Paternal Grandfather   . Diabetes Paternal Grandmother   . Stroke Paternal Grandmother     Social History   Social History  . Marital status: Married    Spouse name: N/A  . Number of children: 0  . Years of education: N/A   Occupational History  . Disabled from [place] History Main Topics  . Smoking status: Never Smoker  . Smokeless tobacco: Never Used  . Alcohol use No  . Drug use: No  . Sexual activity: Not on file   Other Topics Concern  . Not on file   Social History Narrative  . No narrative on file    Allergies  Allergen Reactions  . Buspirone Hcl     REACTION: dystonia  . Simvastatin     REACTION: malaise  . Sulfa Antibiotics     nausea     Constitutional: Denies fever, malaise, fatigue, headache or abrupt weight changes.   GU: Pt reports pelvic pressure, flank pain  and pain with urination. Denies urgency, frequency, burning sensation, blood in urine, odor or discharge. Skin: Denies redness, rashes,  lesions or ulcercations.   No other specific complaints in a complete review of systems (except as listed in HPI above).    Objective:   Physical Exam  BP 122/80   Pulse 65   Temp 98 F (36.7 C) (Oral)   Wt 169 lb (76.7 kg)   SpO2 98%   BMI 29.94 kg/m  Wt Readings from Last 3 Encounters:  04/04/16 169 lb (76.7 kg)  02/20/16 170 lb (77.1 kg)  02/19/16 164 lb (74.4 kg)    General: Appears her stated age, well developed, well nourished in NAD. Cardiovascular: Normal rate and rhythm. S1,S2 noted.   Pulmonary/Chest: Normal effort and positive vesicular breath sounds. No respiratory distress. No wheezes, rales or ronchi noted.  Abdomen: Soft. Normal bowel sounds. No distention or masses noted.  Tender to palpation over the bladder area. No CVA tenderness.       Assessment & Plan:   Pelvic pressure and  dysuria secondary to UTI:  Urinalysis: 3+ leuks Will send urine culture eRx sent if for Cipro 500 mg BID x 5 days (stop Macrobid while on Cipro) OK to take AZO OTC Drink plenty of fluids  RTC as needed or if symptoms persist. Webb Silversmith, NP

## 2016-04-04 NOTE — Telephone Encounter (Signed)
Lindsey [name] with [facility] left v/m; requesting cb about possible interaction between cipro and hydroxyzine and duloxetine. Pt is not starting med until Lindsey [name] gets cb.

## 2016-04-04 NOTE — Addendum Note (Signed)
Addended by: Lurlean Nanny on: 04/04/2016 03:35 PM   Modules accepted: Orders

## 2016-04-05 LAB — URINE CULTURE: Organism ID, Bacteria: NO GROWTH

## 2016-04-05 NOTE — Telephone Encounter (Signed)
noted 

## 2016-04-09 ENCOUNTER — Encounter: Payer: Self-pay | Admitting: Family Medicine

## 2016-04-09 ENCOUNTER — Ambulatory Visit (INDEPENDENT_AMBULATORY_CARE_PROVIDER_SITE_OTHER): Payer: Medicare Other | Admitting: Family Medicine

## 2016-04-09 VITALS — BP 118/76 | HR 59 | Temp 98.3°F | Ht 63.25 in | Wt 169.0 lb

## 2016-04-09 DIAGNOSIS — Z0000 Encounter for general adult medical examination without abnormal findings: Secondary | ICD-10-CM | POA: Diagnosis not present

## 2016-04-09 DIAGNOSIS — R739 Hyperglycemia, unspecified: Secondary | ICD-10-CM | POA: Diagnosis not present

## 2016-04-09 DIAGNOSIS — I10 Essential (primary) hypertension: Secondary | ICD-10-CM | POA: Diagnosis not present

## 2016-04-09 DIAGNOSIS — E7800 Pure hypercholesterolemia, unspecified: Secondary | ICD-10-CM

## 2016-04-09 DIAGNOSIS — E559 Vitamin D deficiency, unspecified: Secondary | ICD-10-CM | POA: Diagnosis not present

## 2016-04-09 DIAGNOSIS — H3530 Unspecified macular degeneration: Secondary | ICD-10-CM

## 2016-04-09 DIAGNOSIS — M8589 Other specified disorders of bone density and structure, multiple sites: Secondary | ICD-10-CM

## 2016-04-09 LAB — CBC WITH DIFFERENTIAL/PLATELET
BASOS PCT: 0.4 % (ref 0.0–3.0)
Basophils Absolute: 0 10*3/uL (ref 0.0–0.1)
EOS PCT: 1.4 % (ref 0.0–5.0)
Eosinophils Absolute: 0.1 10*3/uL (ref 0.0–0.7)
HCT: 41 % (ref 36.0–46.0)
HEMOGLOBIN: 13.6 g/dL (ref 12.0–15.0)
LYMPHS ABS: 3.2 10*3/uL (ref 0.7–4.0)
LYMPHS PCT: 38.1 % (ref 12.0–46.0)
MCHC: 33.3 g/dL (ref 30.0–36.0)
MCV: 88.4 fl (ref 78.0–100.0)
MONOS PCT: 6.6 % (ref 3.0–12.0)
Monocytes Absolute: 0.5 10*3/uL (ref 0.1–1.0)
NEUTROS ABS: 4.4 10*3/uL (ref 1.4–7.7)
Neutrophils Relative %: 53.5 % (ref 43.0–77.0)
Platelets: 246 10*3/uL (ref 150.0–400.0)
RBC: 4.64 Mil/uL (ref 3.87–5.11)
RDW: 15.6 % — ABNORMAL HIGH (ref 11.5–15.5)
WBC: 8.3 10*3/uL (ref 4.0–10.5)

## 2016-04-09 LAB — COMPREHENSIVE METABOLIC PANEL
ALBUMIN: 3.7 g/dL (ref 3.5–5.2)
ALT: 20 U/L (ref 0–35)
AST: 17 U/L (ref 0–37)
Alkaline Phosphatase: 74 U/L (ref 39–117)
BUN: 8 mg/dL (ref 6–23)
CHLORIDE: 100 meq/L (ref 96–112)
CO2: 28 mEq/L (ref 19–32)
CREATININE: 0.83 mg/dL (ref 0.40–1.20)
Calcium: 9.7 mg/dL (ref 8.4–10.5)
GFR: 74.67 mL/min (ref >60.00)
Glucose, Bld: 110 mg/dL — ABNORMAL HIGH (ref 70–99)
Potassium: 4.7 mEq/L (ref 3.5–5.1)
SODIUM: 136 meq/L (ref 135–145)
TOTAL PROTEIN: 7.1 g/dL (ref 6.0–8.3)
Total Bilirubin: 0.4 mg/dL (ref 0.2–1.2)

## 2016-04-09 LAB — LIPID PANEL
CHOL/HDL RATIO: 3
Cholesterol: 185 mg/dL (ref 0–200)
HDL: 54.7 mg/dL (ref >39.00)
LDL Cholesterol: 119 mg/dL — ABNORMAL HIGH (ref 0–99)
NONHDL: 130.77
Triglycerides: 61 mg/dL (ref 0.0–149.0)
VLDL: 12.2 mg/dL (ref 0.0–40.0)

## 2016-04-09 LAB — VITAMIN D 25 HYDROXY (VIT D DEFICIENCY, FRACTURES): VITD: 80.49 ng/mL (ref 30.00–100.00)

## 2016-04-09 LAB — HEMOGLOBIN A1C: Hgb A1c MFr Bld: 6.2 % (ref 4.6–6.5)

## 2016-04-09 LAB — TSH: TSH: 2.28 u[IU]/mL (ref 0.35–4.50)

## 2016-04-09 MED ORDER — ATENOLOL 100 MG PO TABS: 100.0000 mg | ORAL_TABLET | Freq: Every day | ORAL | 3 refills | Status: DC

## 2016-04-09 NOTE — Patient Instructions (Addendum)
When you go for your eye exam please tell them your bad vision is starting to cause you to fall    For cholesterol  Avoid red meat/ fried foods/ egg yolks/ fatty breakfast meats/ butter, cheese and high fat dairy/ and shellfish   For blood sugar Start cutting refined carbs /sugar for pre diabetes   Take care of yourself  Stay as active as you can be -low impact exercise is beneficial for mind and body   Labs today

## 2016-04-09 NOTE — Assessment & Plan Note (Signed)
bp in fair control at this time  BP Readings from Last 1 Encounters:  04/09/16 118/76   No changes needed Disc lifstyle change with low sodium diet and exercise  Labs today

## 2016-04-09 NOTE — Assessment & Plan Note (Signed)
A1C today  Not watching diet at all Disc imp of low glycemic diet/ wt loss and low impact exercise as tolerated to prevent DM2

## 2016-04-09 NOTE — Assessment & Plan Note (Signed)
Reviewed health habits including diet and exercise and skin cancer prevention Reviewed appropriate screening tests for age  Also reviewed health mt list, fam hx and immunization status , as well as social and family history   Unable to exercise due to chronic med problems Stressed imp of low chol/ sugar diet  Continues vit D Labs today  Will f/u for AMW at a later time  utd imms  Mammogram utd Colon screen utd Has had a hysterectomy

## 2016-04-09 NOTE — Progress Notes (Signed)
Pre visit review using our clinic review tool, if applicable. No additional management support is needed unless otherwise documented below in the visit note. 

## 2016-04-09 NOTE — Assessment & Plan Note (Signed)
Worrisome that her vision has caused her to fall For f/u soon Per pt -intolerant of the supplements recommended

## 2016-04-09 NOTE — Assessment & Plan Note (Signed)
Lab today  Not watching diet  Disc goals for lipids and reasons to control them Rev labs with pt (last check) Given handout on low fat diet  Rev low sat fat diet in detail

## 2016-04-09 NOTE — Assessment & Plan Note (Signed)
Mild on last dexa 2015 Does not desire another test yet  Pos falls but no fractures On vit D  Disc need for calcium/ vitamin D/ wt bearing exercise and bone density test every 2 y to monitor Disc safety/ fracture risk in detail

## 2016-04-09 NOTE — Progress Notes (Signed)
Subjective:    Patient ID: Lindsey [name], female    DOB: 10/21/1955, 61 y.o.   MRN: [id number]  HPI Here for health maintenance exam and to review chronic medical problems    Finishing cipro for uti  Intolerant of it  Her IC is always up and down   Wt Readings from Last 3 Encounters:  04/09/16 169 lb (76.7 kg)  04/04/16 169 lb (76.7 kg)  02/20/16 170 lb (77.1 kg)  does well with mt  bmi is 29.7  Gyn care Hx of a partial hysterectomy- no problems  No symptoms  (all pain is from IC)  Tetanus shot 1/09  Mammogram 9/17-normal  Self breast exam- no lumps   Colonoscopy 5/12 She states it was normal - 10 year recall ? She is unsure  Dr Recardo Evangelist with IBS    Flu shot 11/17  Vit D level is 35.9 dexa 4/15 mild osteopenia No fractures  Does occasionally fall  (she blames it on her macular degeneration)  Wants to wait for another bone density  She takes vit D   Has macular degeneration  Goes this mo for visit  Will let them know    bp is stable today  No cp or palpitations or headaches or edema  No side effects to medicines  BP Readings from Last 3 Encounters:  04/09/16 118/76  04/04/16 122/80  02/20/16 (!) 139/91     Hx of hyperglycemia  Lab Results  Component Value Date   HGBA1C 6.2 10/24/2014  diet is not good in general  She does not eat "much at all"  Instant oatmeal for bkfast Eats out every evening    Hx of hyperlipidemia Lab Results  Component Value Date   CHOL 204 (H) 10/24/2014   CHOL 196 03/28/2014   CHOL 201 (H) 12/17/2013   Lab Results  Component Value Date   HDL 52.90 10/24/2014   HDL 67.50 03/28/2014   HDL 45.00 12/17/2013   Lab Results  Component Value Date   LDLCALC 138 (H) 10/24/2014   LDLCALC 120 (H) 03/28/2014   LDLCALC 142 (H) 12/17/2013   Lab Results  Component Value Date   TRIG 66.0 10/24/2014   TRIG 43.0 03/28/2014   TRIG 69.0 12/17/2013   Lab Results  Component Value Date   CHOLHDL 4 10/24/2014   CHOLHDL 3 03/28/2014   CHOLHDL 4 12/17/2013   Lab Results  Component Value Date   LDLDIRECT 137.4 11/27/2012   LDLDIRECT 166.1 07/24/2012   LDLDIRECT 161.5 07/03/2011    Knows what she needs to watch   Due for labs   Will schedule a return visit for AMW  Patient Active Problem List   Diagnosis Date Noted  . Macular degeneration 04/09/2016  . Osteoarthritis, knee 02/15/2016  . S/P laparoscopic cholecystectomy June 2017 09/26/2015  . RUQ pain 08/09/2015  . Nausea without vomiting 08/09/2015  . Dermatitis 10/31/2014  . Hyperglycemia 12/24/2013  . Encounter for Medicare annual wellness exam 09/04/2012  . HSV (herpes simplex virus) infection 04/08/2012  . IBS (irritable bowel syndrome) 10/21/2011  . Routine general medical examination at a health care facility 07/02/2011  . Osteopenia 05/17/2009  . DEPRESSION 10/05/2008  . GERD 05/05/2008  . OBSTRUCTIVE SLEEP APNEA 06/25/2007  . Hyperlipidemia 05/31/2007  . Essential hypertension 05/31/2007  . Other fatigue 04/23/2007  . Vitamin D deficiency 01/22/2007  . MIGRAINE HEADACHE 01/22/2007  . CYSTITIS, CHRONIC INTERSTITIAL 01/22/2007  . MENOPAUSE-RELATED VASOMOTOR SYMPTOMS 01/22/2007  . OSTEOARTHROSIS, GENERALIZED, MULTIPLE SITES  01/22/2007  . Fibromyalgia 01/22/2007  . PATENT FORAMEN OVALE 01/22/2007   Past Medical History:  Diagnosis Date  . Arthritis   . Chronic fatigue   . Fibromyalgia   . Hemorrhoids   . Hyperlipidemia   . Hypersomnia   . Hypertension   . IBS (irritable bowel syndrome)   . Interstitial cystitis   . Migraines   . Osteopenia   . Pelvic floor dysfunction   . PFO (patent foramen ovale)   . Tendonitis    hand  . Vitamin D deficiency    Past Surgical History:  Procedure Laterality Date  . ANKLE ARTHROSCOPY Right   . ANKLE FRACTURE SURGERY Left   . BLADDER SURGERY     and hydrodistension  . BREAST LUMPECTOMY     many, bilateral  . CERVICAL DISCECTOMY  2006   fusion and plating  .  CHOLECYSTECTOMY N/A 09/26/2015   Procedure: LAPAROSCOPIC CHOLECYSTECTOMY WITH INTRAOPERATIVE CHOLANGIOGRAM;  Surgeon: Johnathan Hausen, MD;  Location: [facility] ORS;  Service: General;  Laterality: N/A;  . COLONOSCOPY    . FRACTURE SURGERY     ankle fracture x 2  . NASAL SINUS SURGERY     multiple, x4  . UPPER GASTROINTESTINAL ENDOSCOPY    . VAGINAL HYSTERECTOMY  1990   because of the IC partial   Social History  Substance Use Topics  . Smoking status: Never Smoker  . Smokeless tobacco: Never Used  . Alcohol use No   Family History  Problem Relation Age of Onset  . Diabetes Father   . Alcohol abuse Father   . Heart disease Father   . Fibromyalgia Mother   . Colon cancer Paternal Uncle   . Stomach cancer Paternal Grandfather   . Diabetes Paternal Grandfather   . Diabetes Paternal Grandmother   . Stroke Paternal Grandmother    Allergies  Allergen Reactions  . Buspirone Hcl     REACTION: dystonia  . Simvastatin     REACTION: malaise  . Sulfa Antibiotics     nausea   Current Outpatient Prescriptions on File Prior to Visit  Medication Sig Dispense Refill  . baclofen (LIORESAL) 10 MG tablet Take 10 mg by mouth 3 (three) times daily as needed.    . botulinum toxin Type A (BOTOX) 100 units SOLR injection Inject 200 Units into the muscle once.     . cholecalciferol (VITAMIN D) 1000 UNITS tablet Take 2,000 Units by mouth 2 (two) times daily.     . ciprofloxacin (CIPRO) 500 MG tablet Take 1 tablet (500 mg total) by mouth 2 (two) times daily. 10 tablet 0  . cyclobenzaprine (FLEXERIL) 10 MG tablet Take 1 tablet (10 mg total) by mouth at bedtime. 30 tablet 5  . diclofenac sodium (VOLTAREN) 1 % GEL Apply 3 g topically 3 (three) times daily as needed.  3  . dicyclomine (BENTYL) 20 MG tablet Take one tab before meals (Patient taking differently: Take 20 mg by mouth 3 (three) times daily as needed for spasms. Take one tab before meals) 90 tablet 5  . DULoxetine (CYMBALTA) 60 MG capsule Take 1  capsule (60 mg total) by mouth daily. 90 capsule 1  . fentaNYL (DURAGESIC - DOSED MCG/HR) 50 MCG/HR Place 50 mcg onto the skin every other day.     Marland Kitchen FLUARIX QUADRIVALENT 0.5 ML injection     . hydrOXYzine (ATARAX) 50 MG tablet Take 50 mg by mouth 2 (two) times daily.      Marland Kitchen lidocaine (LIDODERM) 5 % Place onto  the skin.    Marland Kitchen nitrofurantoin (MACRODANTIN) 50 MG capsule Take 50 mg by mouth daily.    . pentosan polysulfate (ELMIRON) 100 MG capsule Take 200 mg by mouth 2 (two) times daily.      No current facility-administered medications on file prior to visit.     Review of Systems Review of Systems  Constitutional: Negative for fever, appetite change, and unexpected weight change. pos for fatigue  Eyes: Negative for pain pos visual disturbance.  Respiratory: Negative for cough and shortness of breath.   Cardiovascular: Negative for cp or palpitations    Gastrointestinal: Negative for nausea, diarrhea and constipation.  Genitourinary: pos for chronic urinary pain and  for urgency and frequency.  Skin: Negative for pallor or rash   MSK pos for chronic myofascial pain  Neurological: Negative for weakness, light-headedness, numbness and pos for headaches.  Hematological: Negative for adenopathy. Does not bruise/bleed easily.  Psychiatric/Behavioral: pos for dysphoric and anx mood at times        Objective:   Physical Exam  Constitutional: She appears well-developed and well-nourished. No distress.  overwt and well appearing (a little less fatigued than usual)  HENT:  Head: Normocephalic and atraumatic.  Right Ear: External ear normal.  Left Ear: External ear normal.  Mouth/Throat: Oropharynx is clear and moist.  Eyes: Conjunctivae and EOM are normal. Pupils are equal, round, and reactive to light. No scleral icterus.  Neck: Normal range of motion. Neck supple. No JVD present. Carotid bruit is not present. No thyromegaly present.  Cardiovascular: Normal rate, regular rhythm, normal  heart sounds and intact distal pulses.  Exam reveals no gallop.   Pulmonary/Chest: Effort normal and breath sounds normal. No respiratory distress. She has no wheezes. She exhibits no tenderness.  Abdominal: Soft. Bowel sounds are normal. She exhibits no distension, no abdominal bruit and no mass. There is no tenderness.  Genitourinary: No breast swelling, tenderness, discharge or bleeding.  Genitourinary Comments: Breast exam: No mass, nodules, thickening, tenderness, bulging, retraction, inflamation, nipple discharge or skin changes noted.  No axillary or clavicular LA.      Musculoskeletal: Normal range of motion. She exhibits no edema or tenderness.  Baseline myofascial pain and sensitivity   Lymphadenopathy:    She has no cervical adenopathy.  Neurological: She is alert. She has normal reflexes. No cranial nerve deficit. She exhibits normal muscle tone. Coordination normal.  Skin: Skin is warm and dry. No rash noted. No erythema. No pallor.  Solar lentigines diffusely Fair complexion   Psychiatric: She has a normal mood and affect.          Assessment & Plan:   Problem List Items Addressed This Visit      Cardiovascular and Mediastinum   Essential hypertension - Primary    bp in fair control at this time  BP Readings from Last 1 Encounters:  04/09/16 118/76   No changes needed Disc lifstyle change with low sodium diet and exercise  Labs today       Relevant Medications   atenolol (TENORMIN) 100 MG tablet     Musculoskeletal and Integument   Osteopenia    Mild on last dexa 2015 Does not desire another test yet  Pos falls but no fractures On vit D  Disc need for calcium/ vitamin D/ wt bearing exercise and bone density test every 2 y to monitor Disc safety/ fracture risk in detail          Other   Hyperglycemia    A1C  today  Not watching diet at all Disc imp of low glycemic diet/ wt loss and low impact exercise as tolerated to prevent DM2      Relevant  Orders   Hemoglobin A1c (Completed)   Hyperlipidemia    Lab today  Not watching diet  Disc goals for lipids and reasons to control them Rev labs with pt (last check) Given handout on low fat diet  Rev low sat fat diet in detail       Relevant Medications   atenolol (TENORMIN) 100 MG tablet   Macular degeneration    Worrisome that her vision has caused her to fall For f/u soon Per pt -intolerant of the supplements recommended       Routine general medical examination at a health care facility    Reviewed health habits including diet and exercise and skin cancer prevention Reviewed appropriate screening tests for age  Also reviewed health mt list, fam hx and immunization status , as well as social and family history   Unable to exercise due to chronic med problems Stressed imp of low chol/ sugar diet  Continues vit D Labs today  Will f/u for AMW at a later time  utd imms  Mammogram utd Colon screen utd Has had a hysterectomy      Relevant Orders   CBC with Differential/Platelet (Completed)   Comprehensive metabolic panel (Completed)   TSH (Completed)   Lipid panel (Completed)   Vitamin D deficiency   Relevant Orders   VITAMIN D 25 Hydroxy (Vit-D Deficiency, Fractures) (Completed)

## 2016-04-23 ENCOUNTER — Ambulatory Visit (INDEPENDENT_AMBULATORY_CARE_PROVIDER_SITE_OTHER): Payer: Medicare Other

## 2016-04-23 VITALS — BP 118/80 | HR 69 | Temp 98.5°F | Ht 63.0 in | Wt 169.0 lb

## 2016-04-23 DIAGNOSIS — Z0000 Encounter for general adult medical examination without abnormal findings: Secondary | ICD-10-CM

## 2016-04-23 DIAGNOSIS — Z114 Encounter for screening for human immunodeficiency virus [HIV]: Secondary | ICD-10-CM | POA: Diagnosis not present

## 2016-04-23 DIAGNOSIS — Z1159 Encounter for screening for other viral diseases: Secondary | ICD-10-CM | POA: Diagnosis not present

## 2016-04-23 NOTE — Patient Instructions (Signed)
Lindsey [name] , Thank you for taking time to come for your Medicare Wellness Visit. I appreciate your ongoing commitment to your health goals. Please review the following plan we discussed and let me know if I can assist you in the future.   These are the goals we discussed: Goals    . Eat more fruits and vegetables          Starting 04/23/2016, I will attempt to eat at least 4-5 servings of fresh fruits and vegetables.        This is a list of the screening recommended for you and due dates:  Health Maintenance  Topic Date Due  . Tetanus Vaccine  04/22/2017  . Mammogram  12/04/2017  . Colon Cancer Screening  08/08/2020  . Flu Shot  Completed  .  Hepatitis C: One time screening is recommended by Center for Disease Control  (CDC) for  adults born from [birth year] through 1964.   Completed  . HIV Screening  Completed   Preventive Care for Adults  A healthy lifestyle and preventive care can promote health and wellness. Preventive health guidelines for adults include the following key practices.  . A routine yearly physical is a good way to check with your health care provider about your health and preventive screening. It is a chance to share any concerns and updates on your health and to receive a thorough exam.  . Visit your dentist for a routine exam and preventive care every 6 months. Brush your teeth twice a day and floss once a day. Good oral hygiene prevents tooth decay and gum disease.  . The frequency of eye exams is based on your age, health, family medical history, use  of contact lenses, and other factors. Follow your health care provider's ecommendations for frequency of eye exams.  . Eat a healthy diet. Foods like vegetables, fruits, whole grains, low-fat dairy products, and lean protein foods contain the nutrients you need without too many calories. Decrease your intake of foods high in solid fats, added sugars, and salt. Eat the right amount of calories for you. Get information  about a proper diet from your health care provider, if necessary.  . Regular physical exercise is one of the most important things you can do for your health. Most adults should get at least 150 minutes of moderate-intensity exercise (any activity that increases your heart rate and causes you to sweat) each week. In addition, most adults need muscle-strengthening exercises on 2 or more days a week.  Silver Sneakers may be a benefit available to you. To determine eligibility, you may visit the website: www.silversneakers.com or contact program at 930-528-1865 Mon-Fri between 8AM-8PM.   . Maintain a healthy weight. The body mass index (BMI) is a screening tool to identify possible weight problems. It provides an estimate of body fat based on height and weight. Your health care provider can find your BMI and can help you achieve or maintain a healthy weight.   For adults 20 years and older: ? A BMI below 18.5 is considered underweight. ? A BMI of 18.5 to 24.9 is normal. ? A BMI of 25 to 29.9 is considered overweight. ? A BMI of 30 and above is considered obese.   . Maintain normal blood lipids and cholesterol levels by exercising and minimizing your intake of saturated fat. Eat a balanced diet with plenty of fruit and vegetables. Blood tests for lipids and cholesterol should begin at age 32 and be repeated every 5 years.  If your lipid or cholesterol levels are high, you are over 50, or you are at high risk for heart disease, you may need your cholesterol levels checked more frequently. Ongoing high lipid and cholesterol levels should be treated with medicines if diet and exercise are not working.  . If you smoke, find out from your health care provider how to quit. If you do not use tobacco, please do not start.  . If you choose to drink alcohol, please do not consume more than 2 drinks per day. One drink is considered to be 12 ounces (355 mL) of beer, 5 ounces (148 mL) of wine, or 1.5 ounces (44  mL) of liquor.  . If you are 69-33 years old, ask your health care provider if you should take aspirin to prevent strokes.  . Use sunscreen. Apply sunscreen liberally and repeatedly throughout the day. You should seek shade when your shadow is shorter than you. Protect yourself by wearing long sleeves, pants, a wide-brimmed hat, and sunglasses year round, whenever you are outdoors.  . Once a month, do a whole body skin exam, using a mirror to look at the skin on your back. Tell your health care provider of new moles, moles that have irregular borders, moles that are larger than a pencil eraser, or moles that have changed in shape or color.

## 2016-04-23 NOTE — Progress Notes (Signed)
PCP notes:   Health maintenance:  HIV screening - completed Hep C screening - completed  Abnormal screenings:   Hearing - failed Fall risk - hx of multiple falls without injury  Patient concerns:   None  Nurse concerns:  None  Next PCP appt:   N/A; CPE in Jan 2018  I reviewed health advisor's note, was available for consultation, and agree with documentation and plan. [name] MD

## 2016-04-23 NOTE — Progress Notes (Signed)
Subjective:   Lindsey [name] is a 61 y.o. female who presents for Medicare Annual (Subsequent) preventive examination.  Review of Systems:  N/A Cardiac Risk Factors include: advanced age (>75men, >58 women);dyslipidemia;hypertension     Objective:     Vitals: BP 118/80 (BP Location: Left Arm, Patient Position: Sitting, Cuff Size: Normal)   Pulse 69   Temp 98.5 F (36.9 C) (Oral)   Ht 5\' 3"  (1.6 m)   Wt 169 lb (76.7 kg)   SpO2 97%   BMI 29.94 kg/m   Body mass index is 29.94 kg/m.   Tobacco History  Smoking Status  . Never Smoker  Smokeless Tobacco  . Never Used     Counseling given: No   Past Medical History:  Diagnosis Date  . Arthritis   . Chronic fatigue   . Fibromyalgia   . Hemorrhoids   . Hyperlipidemia   . Hypersomnia   . Hypertension   . IBS (irritable bowel syndrome)   . Interstitial cystitis   . Migraines   . Osteopenia   . Pelvic floor dysfunction   . PFO (patent foramen ovale)   . Tendonitis    hand  . Vitamin D deficiency    Past Surgical History:  Procedure Laterality Date  . ANKLE ARTHROSCOPY Right   . ANKLE FRACTURE SURGERY Left   . BLADDER SURGERY     and hydrodistension  . BREAST LUMPECTOMY     many, bilateral  . CERVICAL DISCECTOMY  2006   fusion and plating  . CHOLECYSTECTOMY N/A 09/26/2015   Procedure: LAPAROSCOPIC CHOLECYSTECTOMY WITH INTRAOPERATIVE CHOLANGIOGRAM;  Surgeon: Johnathan Hausen, MD;  Location: [facility] ORS;  Service: General;  Laterality: N/A;  . COLONOSCOPY    . FRACTURE SURGERY     ankle fracture x 2  . NASAL SINUS SURGERY     multiple, x4  . UPPER GASTROINTESTINAL ENDOSCOPY    . VAGINAL HYSTERECTOMY  1990   because of the IC partial   Family History  Problem Relation Age of Onset  . Diabetes Father   . Alcohol abuse Father   . Heart disease Father   . Fibromyalgia Mother   . Colon cancer Paternal Uncle   . Stomach cancer Paternal Grandfather   . Diabetes Paternal Grandfather   . Diabetes Paternal  Grandmother   . Stroke Paternal Grandmother    History  Sexual Activity  . Sexual activity: Yes    Outpatient Encounter Prescriptions as of 04/23/2016  Medication Sig  . atenolol (TENORMIN) 100 MG tablet Take 1 tablet (100 mg total) by mouth daily.  . baclofen (LIORESAL) 10 MG tablet Take 10 mg by mouth 3 (three) times daily as needed.  . botulinum toxin Type A (BOTOX) 100 units SOLR injection Inject 200 Units into the muscle once.   . cholecalciferol (VITAMIN D) 1000 UNITS tablet Take 2,000 Units by mouth 2 (two) times daily.   . ciprofloxacin (CIPRO) 500 MG tablet Take 1 tablet (500 mg total) by mouth 2 (two) times daily.  . cyclobenzaprine (FLEXERIL) 10 MG tablet Take 1 tablet (10 mg total) by mouth at bedtime.  . diclofenac sodium (VOLTAREN) 1 % GEL Apply 3 g topically 3 (three) times daily as needed.  . dicyclomine (BENTYL) 20 MG tablet Take one tab before meals (Patient taking differently: Take 20 mg by mouth 3 (three) times daily as needed for spasms. Take one tab before meals)  . DULoxetine (CYMBALTA) 60 MG capsule Take 1 capsule (60 mg total) by mouth daily.  Marland Kitchen  fentaNYL (DURAGESIC - DOSED MCG/HR) 50 MCG/HR Place 50 mcg onto the skin every other day.   Marland Kitchen FLUARIX QUADRIVALENT 0.5 ML injection   . hydrOXYzine (ATARAX) 50 MG tablet Take 50 mg by mouth 2 (two) times daily.    Marland Kitchen lidocaine (LIDODERM) 5 % Place onto the skin.  Marland Kitchen nitrofurantoin (MACRODANTIN) 50 MG capsule Take 50 mg by mouth daily.  . pentosan polysulfate (ELMIRON) 100 MG capsule Take 200 mg by mouth 2 (two) times daily.    No facility-administered encounter medications on file as of 04/23/2016.     Activities of Daily Living In your present state of health, do you have any difficulty performing the following activities: 04/23/2016 09/20/2015  Hearing? N N  Vision? Y Y  Difficulty concentrating or making decisions? Tempie Donning  Walking or climbing stairs? Y Y  Dressing or bathing? N Y  Doing errands, shopping? Y N  Preparing  Food and eating ? Y -  Using the Toilet? N -  In the past six months, have you accidently leaked urine? Y -  Do you have problems with loss of bowel control? N -  Managing your Medications? N -  Managing your Finances? Y -  Housekeeping or managing your Housekeeping? Y -  Some recent data might be hidden    Patient Care Team: Abner Greenspan, MD as PCP - General    Assessment:     Hearing Screening   125Hz  250Hz  500Hz  1000Hz  2000Hz  3000Hz  4000Hz  6000Hz  8000Hz   Right ear:   40 0 40  40    Left ear:   40 0 40  40    Vision Screening Comments: Last vision exam in Jan 2017 with Dr. Johny Shock   Exercise Activities and Dietary recommendations Current Exercise Habits: The patient does not participate in regular exercise at present, Exercise limited by: neurologic condition(s);Other - see comments (pt reports bladder and pelvic pain; chronic fatigue syndrome)  Goals    . Eat more fruits and vegetables          Starting 04/23/2016, I will attempt to eat at least 4-5 servings of fresh fruits and vegetables.       Fall Risk Fall Risk  04/23/2016 10/31/2014 12/24/2013 09/06/2012  Falls in the past year? Yes Yes Yes No  Number falls in past yr: 2 or more 2 or more 2 or more -  Injury with Fall? No No - -  Risk Factor Category  - - High Fall Risk -   Depression Screen PHQ 2/9 Scores 04/23/2016 10/31/2014 12/24/2013 09/06/2012  PHQ - 2 Score 0 0 0 2     Cognitive Function MMSE - Mini Mental State Exam 04/23/2016  Orientation to time 5  Orientation to Place 5  Registration 3  Attention/ Calculation 0  Recall 3  Language- name 2 objects 0  Language- repeat 1  Language- follow 3 step command 3  Language- read & follow direction 0  Write a sentence 0  Copy design 0  Total score 20     PLEASE NOTE: A Mini-Cog screen was completed. Maximum score is 20. A value of 0 denotes this part of Folstein MMSE was not completed or the patient failed this part of the Mini-Cog screening.   Mini-Cog  Screening Orientation to Time - Max 5 pts Orientation to Place - Max 5 pts Registration - Max 3 pts Recall - Max 3 pts Language Repeat - Max 1 pts Language Follow 3 Step Command - Max 3 pts  Immunization History  Administered Date(s) Administered  . Influenza Whole 12/31/2006  . Influenza,inj,Quad PF,36+ Mos 12/24/2013, 02/20/2016  . Pneumococcal Polysaccharide-23 09/04/2012  . Td 04/23/2007   Screening Tests Health Maintenance  Topic Date Due  . TETANUS/TDAP  04/22/2017  . MAMMOGRAM  12/04/2017  . COLONOSCOPY  08/08/2020  . INFLUENZA VACCINE  Completed  . Hepatitis C Screening  Completed  . HIV Screening  Completed      Plan:     I have personally reviewed and addressed the Medicare Annual Wellness questionnaire and have noted the following in the patient's chart:  A. Medical and social history B. Use of alcohol, tobacco or illicit drugs  C. Current medications and supplements D. Functional ability and status E.  Nutritional status F.  Physical activity G. Advance directives H. List of other physicians I.  Hospitalizations, surgeries, and ER visits in previous 12 months J.  [place] to include hearing, vision, cognitive, depression L. Referrals and appointments - none  In addition, I have reviewed and discussed with patient certain preventive protocols, quality metrics, and best practice recommendations. A written personalized care plan for preventive services as well as general preventive health recommendations were provided to patient.  See attached scanned questionnaire for additional information.   Signed,   Lindell Noe, MHA, BS, LPN Health Coach

## 2016-04-23 NOTE — Progress Notes (Signed)
Pre visit review using our clinic review tool, if applicable. No additional management support is needed unless otherwise documented below in the visit note. 

## 2016-04-24 LAB — HIV ANTIBODY (ROUTINE TESTING W REFLEX): HIV 1&2 Ab, 4th Generation: NONREACTIVE

## 2016-04-24 LAB — HEPATITIS C ANTIBODY: HCV Ab: NEGATIVE

## 2016-04-25 DIAGNOSIS — G43719 Chronic migraine without aura, intractable, without status migrainosus: Secondary | ICD-10-CM | POA: Diagnosis not present

## 2016-05-23 ENCOUNTER — Other Ambulatory Visit: Payer: Self-pay | Admitting: *Deleted

## 2016-05-23 ENCOUNTER — Encounter: Payer: Self-pay | Admitting: *Deleted

## 2016-05-24 ENCOUNTER — Ambulatory Visit (INDEPENDENT_AMBULATORY_CARE_PROVIDER_SITE_OTHER): Payer: Medicare Other | Admitting: Family Medicine

## 2016-05-24 ENCOUNTER — Encounter: Payer: Self-pay | Admitting: Family Medicine

## 2016-05-24 VITALS — BP 120/90 | HR 67 | Temp 98.2°F | Ht 63.0 in | Wt 171.2 lb

## 2016-05-24 DIAGNOSIS — R300 Dysuria: Secondary | ICD-10-CM

## 2016-05-24 DIAGNOSIS — N3010 Interstitial cystitis (chronic) without hematuria: Secondary | ICD-10-CM | POA: Diagnosis not present

## 2016-05-24 LAB — POC URINALSYSI DIPSTICK (AUTOMATED)
Bilirubin, UA: NEGATIVE
Glucose, UA: NEGATIVE
KETONES UA: NEGATIVE
Leukocytes, UA: NEGATIVE
Nitrite, UA: POSITIVE
SPEC GRAV UA: 1.025
Urobilinogen, UA: 0.2
pH, UA: 6

## 2016-05-24 LAB — POCT UA - MICROSCOPIC ONLY

## 2016-05-24 MED ORDER — CEPHALEXIN 500 MG PO CAPS: 500.0000 mg | ORAL_CAPSULE | Freq: Three times a day (TID) | ORAL | 0 refills | Status: DC

## 2016-05-24 NOTE — Progress Notes (Signed)
Pre visit review using our clinic review tool, if applicable. No additional management support is needed unless otherwise documented below in the visit note. 

## 2016-05-24 NOTE — Assessment & Plan Note (Signed)
Keep upcoming OV with Dr. Amalia Hailey for further treatment of IC.

## 2016-05-24 NOTE — Progress Notes (Signed)
   Subjective:    Patient ID: Lindsey [name], female    DOB: [birth year], 61 y.o.   MRN: [id number]  Dysuria   This is a new problem. The current episode started in the past 7 days (2 days). The problem has been rapidly worsening. The quality of the pain is described as burning. The pain is moderate. There has been no fever. She is sexually active. There is no history of pyelonephritis. Associated symptoms include flank pain, frequency, nausea and urgency. Pertinent negatives include no chills, hematuria, hesitancy or vomiting. Associated symptoms comments: Pelvic pain. She has tried increased fluids for the symptoms. The treatment provided no relief. Her past medical history is significant for recurrent UTIs. There is no history of catheterization, kidney stones, a single kidney, urinary stasis or a urological procedure.    Hx of chronic interstitial cystitis  She is on macrobid daily for UTI prophylaxis for recurrent UTI   treated for UTI Cipro x 5 days on 04/04/2016  Ur culture was negative.  Had sex day prior to symtpoms. Pain with intercourse.  Review of Systems  Constitutional: Negative for chills.  Gastrointestinal: Positive for nausea. Negative for vomiting.  Genitourinary: Positive for dysuria, flank pain, frequency and urgency. Negative for hematuria and hesitancy.       Objective:   Physical Exam  Constitutional: Vital signs are normal. She appears well-developed and well-nourished. She is cooperative.  Non-toxic appearance. She does not appear ill. No distress.  HENT:  Head: Normocephalic.  Right Ear: Hearing, tympanic membrane, external ear and ear canal normal. Tympanic membrane is not erythematous, not retracted and not bulging.  Left Ear: Hearing, tympanic membrane, external ear and ear canal normal. Tympanic membrane is not erythematous, not retracted and not bulging.  Nose: No mucosal edema or rhinorrhea. Right sinus exhibits no maxillary sinus tenderness and no frontal  sinus tenderness. Left sinus exhibits no maxillary sinus tenderness and no frontal sinus tenderness.  Mouth/Throat: Uvula is midline, oropharynx is clear and moist and mucous membranes are normal.  Eyes: Conjunctivae, EOM and lids are normal. Pupils are equal, round, and reactive to light. Lids are everted and swept, no foreign bodies found.  Neck: Trachea normal and normal range of motion. Neck supple. Carotid bruit is not present. No thyroid mass and no thyromegaly present.  Cardiovascular: Normal rate, regular rhythm, S1 normal, S2 normal, normal heart sounds, intact distal pulses and normal pulses.  Exam reveals no gallop and no friction rub.   No murmur heard. Pulmonary/Chest: Effort normal and breath sounds normal. No tachypnea. No respiratory distress. She has no decreased breath sounds. She has no wheezes. She has no rhonchi. She has no rales.  Abdominal: Soft. Normal appearance and bowel sounds are normal. There is no hepatosplenomegaly, splenomegaly or hepatomegaly. There is tenderness in the suprapubic area and left lower quadrant. There is guarding and CVA tenderness. There is no rigidity and no rebound.  Neurological: She is alert.  Skin: Skin is warm, dry and intact. No rash noted.  Psychiatric: Her speech is normal and behavior is normal. Judgment and thought content normal. Her mood appears not anxious. Cognition and memory are normal. She does not exhibit a depressed mood.          Assessment & Plan:

## 2016-05-24 NOTE — Patient Instructions (Addendum)
Push fluids.  Complete antibiotics.  Follow up as scheduled with D.r evan's. We will call with urine culture results.

## 2016-05-24 NOTE — Assessment & Plan Note (Addendum)
Most likely UTI given TNTC rbcs on micro and nitrate on UA.  Will send for culture but start on antibitoics for acute cystitis.  Appears in 04/2016 no growth on culture, but this time it is more likely an infection than a flare of IC.

## 2016-05-27 LAB — URINE CULTURE

## 2016-05-28 DIAGNOSIS — H2513 Age-related nuclear cataract, bilateral: Secondary | ICD-10-CM | POA: Diagnosis not present

## 2016-06-10 ENCOUNTER — Telehealth: Payer: Self-pay | Admitting: Pharmacist

## 2016-06-10 NOTE — Telephone Encounter (Signed)
Received fax from patient's insurance regarding drug-drug interaction: cyclobenzaprine and duloxetine.  Coadministration may increase the risk of serotonin syndrome.    Letter was reviewed by Mr. Carlyon Shadow, who reports "ok to switch to tizanidine if patient is not allergic" (see attached).  I called patient to discuss.  There was no answer.  Left message asking her to call me back.    Elisabeth Most, Pharm.D., BCPS, CPP Clinical Pharmacist Pager: [phone] Phone: [phone] 06/10/2016 11:52 AM

## 2016-06-11 NOTE — Telephone Encounter (Signed)
Spoke to patient regarding the increased risk of serotonin syndrome between duloxetine and tizanidine.  Discussed switching to tizanidine.  Reviewed purpose, proper use, and adverse effects of tizanidine including sedation.  Patient was advised not to drive after taking tizanidine.    Patient agreed to trial of tizanidine.

## 2016-06-12 NOTE — Telephone Encounter (Signed)
Okay to prescribe tizanidine 4 mg; 1 by mouth daily at bedtime; 30 day supply with 2 refillsIf patient does well with this medication, we can do a 90 day supply future.She should not take any sleep aid or any other muscle relaxers in the evening with this medication.This medicine may make her drowsy so therefore she should take it early enough in the evening sweats out of her system by the next day. Please give all of this advice of the patient thank you

## 2016-06-13 DIAGNOSIS — R5382 Chronic fatigue, unspecified: Secondary | ICD-10-CM | POA: Diagnosis not present

## 2016-06-13 DIAGNOSIS — M797 Fibromyalgia: Secondary | ICD-10-CM | POA: Diagnosis not present

## 2016-06-13 DIAGNOSIS — K582 Mixed irritable bowel syndrome: Secondary | ICD-10-CM | POA: Diagnosis not present

## 2016-06-13 DIAGNOSIS — N3010 Interstitial cystitis (chronic) without hematuria: Secondary | ICD-10-CM | POA: Diagnosis not present

## 2016-06-13 NOTE — Telephone Encounter (Signed)
Attempted to contact the patient and left message for patient to call the office.  

## 2016-06-17 MED ORDER — TIZANIDINE HCL 4 MG PO TABS: 4.0000 mg | ORAL_TABLET | Freq: Every day | ORAL | 2 refills | Status: DC

## 2016-06-17 NOTE — Telephone Encounter (Signed)
Patient advised and prescription sent to the pharmacy 

## 2016-07-25 DIAGNOSIS — G43719 Chronic migraine without aura, intractable, without status migrainosus: Secondary | ICD-10-CM | POA: Diagnosis not present

## 2016-08-16 DIAGNOSIS — Z87440 Personal history of urinary (tract) infections: Secondary | ICD-10-CM | POA: Insufficient documentation

## 2016-08-16 DIAGNOSIS — M6289 Other specified disorders of muscle: Secondary | ICD-10-CM | POA: Insufficient documentation

## 2016-08-16 NOTE — Progress Notes (Signed)
Office Visit Note  Patient: Lindsey [name]             Date of Birth: Dec 16, 1955           MRN: [id number]             PCP: Abner Greenspan, MD Referring: Tower, Wynelle Fanny, MD Visit Date: 08/29/2016 Occupation: @GUAROCC @    Subjective:  Fibromyalgia (Follow up)   History of Present Illness: Lindsey [name] is a 61 y.o. female  Last seen approximately 5 months ago. Patient continues to have fibromyalgia issues. She rates her discomfort from fibrosed about 5 on a scale of 0-10. Her fatigue is also high and rated about 8 on a scale of 0-10.  In addition to the fibromyalgia problems, she continues to have weight gain, poor sleep.  In addition, patient is having an unusual rash going on for some time. She describes rash to her scalp, sores in her mouth that come and go, rashes to her eyelids bilaterally, rash to her chest and her anterior neck area, and a rash to her right foot. She has seen a dermatologist but they were unable to give her diagnosis.  She has ongoing fatigue.  She also complains of hurting all over.   Activities of Daily Living:  Patient reports morning stiffness for 30 minutes.   Patient Reports nocturnal pain.  Difficulty dressing/grooming: Reports Difficulty climbing stairs: Reports Difficulty getting out of chair: Reports Difficulty using hands for taps, buttons, cutlery, and/or writing: Reports   Review of Systems  Constitutional: Positive for fatigue.  HENT: Negative for mouth sores and mouth dryness.   Eyes: Negative for dryness.  Respiratory: Negative for shortness of breath.   Gastrointestinal: Negative for constipation and diarrhea.  Musculoskeletal: Positive for myalgias and myalgias.  Skin: Negative for sensitivity to sunlight.  Psychiatric/Behavioral: Positive for sleep disturbance. Negative for decreased concentration.    PMFS History:  Patient Active Problem List   Diagnosis Date Noted  . History of recurrent cystitis (IC) 08/16/2016  .  Pelvic floor dysfunction 08/16/2016  . Dysuria 05/24/2016  . Macular degeneration 04/09/2016  . Osteoarthritis, knee 02/15/2016  . S/P laparoscopic cholecystectomy June 2017 09/26/2015  . RUQ pain 08/09/2015  . Nausea without vomiting 08/09/2015  . Dermatitis 10/31/2014  . Hyperglycemia 12/24/2013  . Encounter for Medicare annual wellness exam 09/04/2012  . HSV (herpes simplex virus) infection 04/08/2012  . IBS (irritable bowel syndrome) 10/21/2011  . Routine general medical examination at a health care facility 07/02/2011  . Osteopenia 05/17/2009  . DEPRESSION 10/05/2008  . GERD 05/05/2008  . OBSTRUCTIVE SLEEP APNEA 06/25/2007  . Hyperlipidemia 05/31/2007  . Essential hypertension 05/31/2007  . Other fatigue 04/23/2007  . Vitamin D deficiency 01/22/2007  . MIGRAINE HEADACHE 01/22/2007  . CYSTITIS, CHRONIC INTERSTITIAL 01/22/2007  . MENOPAUSE-RELATED VASOMOTOR SYMPTOMS 01/22/2007  . OSTEOARTHROSIS, GENERALIZED, MULTIPLE SITES 01/22/2007  . Fibromyalgia 01/22/2007  . PATENT FORAMEN OVALE 01/22/2007    Past Medical History:  Diagnosis Date  . Arthritis   . Chronic fatigue   . Fibromyalgia   . Hemorrhoids   . Hyperlipidemia   . Hypersomnia   . Hypertension   . IBS (irritable bowel syndrome)   . Interstitial cystitis   . Migraines   . Osteopenia   . Pelvic floor dysfunction   . PFO (patent foramen ovale)   . Tendonitis    hand  . Vitamin D deficiency     Family History  Problem Relation Age of Onset  .  Diabetes Father   . Alcohol abuse Father   . Heart disease Father   . Fibromyalgia Mother   . Colon cancer Paternal Uncle   . Stomach cancer Paternal Grandfather   . Diabetes Paternal Grandfather   . Diabetes Paternal Grandmother   . Stroke Paternal Grandmother    Past Surgical History:  Procedure Laterality Date  . ANKLE ARTHROSCOPY Right   . ANKLE FRACTURE SURGERY Left   . BLADDER SURGERY     and hydrodistension  . BREAST LUMPECTOMY     many, bilateral    . CERVICAL DISCECTOMY  2006   fusion and plating  . CHOLECYSTECTOMY N/A 09/26/2015   Procedure: LAPAROSCOPIC CHOLECYSTECTOMY WITH INTRAOPERATIVE CHOLANGIOGRAM;  Surgeon: Johnathan Hausen, MD;  Location: [facility] ORS;  Service: General;  Laterality: N/A;  . COLONOSCOPY    . FRACTURE SURGERY     ankle fracture x 2  . NASAL SINUS SURGERY     multiple, x4  . UPPER GASTROINTESTINAL ENDOSCOPY    . VAGINAL HYSTERECTOMY  1990   because of the IC partial   Social History   Social History Narrative  . No narrative on file     Objective: Vital Signs: BP 110/72 (BP Location: Left Arm, Patient Position: Sitting, Cuff Size: Normal)   Pulse 67   Resp 14   Ht 5\' 3"  (1.6 m)   Wt 173 lb (78.5 kg)   BMI 30.65 kg/m    Physical Exam  Constitutional: She is oriented to person, place, and time. She appears well-developed and well-nourished.  HENT:  Head: Normocephalic and atraumatic.  Eyes: EOM are normal. Pupils are equal, round, and reactive to light.  Cardiovascular: Normal rate, regular rhythm and normal heart sounds.  Exam reveals no gallop and no friction rub.   No murmur heard. Pulmonary/Chest: Effort normal and breath sounds normal. She has no wheezes. She has no rales.  Abdominal: Soft. Bowel sounds are normal. She exhibits no distension. There is no tenderness. There is no guarding. No hernia.  Musculoskeletal: Normal range of motion. She exhibits no edema, tenderness or deformity.  Lymphadenopathy:    She has no cervical adenopathy.  Neurological: She is alert and oriented to person, place, and time. Coordination normal.  Skin: Skin is warm and dry. Capillary refill takes less than 2 seconds. No rash noted.  Psychiatric: She has a normal mood and affect. Her behavior is normal.  Nursing note and vitals reviewed.    Musculoskeletal Exam:  Full range of motion of all joints Grip strength is equal and strong bilaterally Fibromyalgia tender points are 18 out of 18 positive  CDAI  Exam: CDAI Homunculus Exam:   Joint Counts:  CDAI Tender Joint count: 0 CDAI Swollen Joint count: 0  Global Assessments:  Patient Global Assessment: 8 Provider Global Assessment: 8  CDAI Calculated Score: 16    Investigation: No additional findings.   Imaging: No results found.                 Office Visit on 05/24/2016  Component Date Value Ref Range Status  . Color, UA 05/24/2016 yellow   Final  . Clarity, UA 05/24/2016 cloudy   Final  . Glucose, UA 05/24/2016 negative   Final  . Bilirubin, UA 05/24/2016 negative   Final  . Ketones, UA 05/24/2016 negative   Final  . Spec Grav, UA 05/24/2016 1.025   Final  . Blood, UA 05/24/2016 large   Final  . pH, UA 05/24/2016 6.0   Final  .  Protein, UA 05/24/2016 2+   Final  . Urobilinogen, UA 05/24/2016 0.2   Final  . Nitrite, UA 05/24/2016 positive   Final  . Leukocytes, UA 05/24/2016 Negative  Negative Final  . WBC, Ur, HPF, POC 05/24/2016 many   Final  . RBC, urine, microscopic 05/24/2016 tntc   Final  . Epithelial cells, urine per micros 05/24/2016 none   Final  . Casts, Ur, LPF, POC 05/24/2016 none   Final  . Yeast, UA 05/24/2016 none   Final  . Culture 05/24/2016 KLEBSIELLA PNEUMONIAE   Final  . Colony Count 05/24/2016 Greater than 100,000 CFU/mL   Final  . Organism ID, Bacteria 05/24/2016 KLEBSIELLA PNEUMONIAE   Final  Clinical Support on 04/23/2016  Component Date Value Ref Range Status  . HIV 1&2 Ab, 4th Generation 04/23/2016 NONREACTIVE  NONREACTIVE Final   Comment:   HIV-1 antigen and HIV-1/HIV-2 antibodies were not detected.  There is no laboratory evidence of HIV infection.   HIV-1/2 Antibody Diff        Not indicated. HIV-1 RNA, Qual TMA          Not indicated.     PLEASE NOTE: This information has been disclosed to you from records whose confidentiality may be protected by state law. If your state requires such protection, then the state law prohibits you from making any further disclosure  of the information without the specific written consent of the person to whom it pertains, or as otherwise permitted by law. A general authorization for the release of medical or other information is NOT sufficient for this purpose.   The performance of this assay has not been clinically validated in patients less than 11 years old.   For additional information please refer to http://education.[employer].com/faq/FAQ106.  (This link is being provided for informational/educational purposes only.)     . HCV Ab 04/23/2016 NEGATIVE  NEGATIVE Final  Office Visit on 04/09/2016  Component Date Value Ref Range Status  . WBC 04/09/2016 8.3  4.0 - 10.5 K/uL Final  . RBC 04/09/2016 4.64  3.87 - 5.11 Mil/uL Final  . Hemoglobin 04/09/2016 13.6  12.0 - 15.0 g/dL Final  . HCT 04/09/2016 41.0  36.0 - 46.0 % Final  . MCV 04/09/2016 88.4  78.0 - 100.0 fl Final  . MCHC 04/09/2016 33.3  30.0 - 36.0 g/dL Final  . RDW 04/09/2016 15.6* 11.5 - 15.5 % Final  . Platelets 04/09/2016 246.0  150.0 - 400.0 K/uL Final  . Neutrophils Relative % 04/09/2016 53.5  43.0 - 77.0 % Final  . Lymphocytes Relative 04/09/2016 38.1  12.0 - 46.0 % Final  . Monocytes Relative 04/09/2016 6.6  3.0 - 12.0 % Final  . Eosinophils Relative 04/09/2016 1.4  0.0 - 5.0 % Final  . Basophils Relative 04/09/2016 0.4  0.0 - 3.0 % Final  . Neutro Abs 04/09/2016 4.4  1.4 - 7.7 K/uL Final  . Lymphs Abs 04/09/2016 3.2  0.7 - 4.0 K/uL Final  . Monocytes Absolute 04/09/2016 0.5  0.1 - 1.0 K/uL Final  . Eosinophils Absolute 04/09/2016 0.1  0.0 - 0.7 K/uL Final  . Basophils Absolute 04/09/2016 0.0  0.0 - 0.1 K/uL Final  . Sodium 04/09/2016 136  135 - 145 mEq/L Final  . Potassium 04/09/2016 4.7  3.5 - 5.1 mEq/L Final  . Chloride 04/09/2016 100  96 - 112 mEq/L Final  . CO2 04/09/2016 28  19 - 32 mEq/L Final  . Glucose, Bld 04/09/2016 110* 70 - 99 mg/dL Final  .  BUN 04/09/2016 8  6 - 23 mg/dL Final  . Creatinine, Ser 04/09/2016 0.83  0.40 -  1.20 mg/dL Final  . Total Bilirubin 04/09/2016 0.4  0.2 - 1.2 mg/dL Final  . Alkaline Phosphatase 04/09/2016 74  39 - 117 U/L Final  . AST 04/09/2016 17  0 - 37 U/L Final  . ALT 04/09/2016 20  0 - 35 U/L Final  . Total Protein 04/09/2016 7.1  6.0 - 8.3 g/dL Final  . Albumin 04/09/2016 3.7  3.5 - 5.2 g/dL Final  . Calcium 04/09/2016 9.7  8.4 - 10.5 mg/dL Final  . GFR 04/09/2016 74.67  >60.00 mL/min Final  . TSH 04/09/2016 2.28  0.35 - 4.50 uIU/mL Final  . Hgb A1c MFr Bld 04/09/2016 6.2  4.6 - 6.5 % Final   Glycemic Control Guidelines for People with Diabetes:Non Diabetic:  <6%Goal of Therapy: <7%Additional Action Suggested:  >8%   . Cholesterol 04/09/2016 185  0 - 200 mg/dL Final   ATP III Classification       Desirable:  < 200 mg/dL               Borderline High:  200 - 239 mg/dL          High:  > = 240 mg/dL  . Triglycerides 04/09/2016 61.0  0.0 - 149.0 mg/dL Final   Normal:  <150 mg/dLBorderline High:  150 - 199 mg/dL  . HDL 04/09/2016 54.70  >39.00 mg/dL Final  . VLDL 04/09/2016 12.2  0.0 - 40.0 mg/dL Final  . LDL Cholesterol 04/09/2016 119* 0 - 99 mg/dL Final  . Total CHOL/HDL Ratio 04/09/2016 3   Final                  Men          Women1/2 Average Risk     3.4          3.3Average Risk          5.0          4.42X Average Risk          9.6          7.13X Average Risk          15.0          11.0                      . NonHDL 04/09/2016 130.77   Final   NOTE:  Non-HDL goal should be 30 mg/dL higher than patient's LDL goal (i.e. LDL goal of < 70 mg/dL, would have non-HDL goal of < 100 mg/dL)  . VITD 04/09/2016 80.49  30.00 - 100.00 ng/mL Final  Office Visit on 04/04/2016  Component Date Value Ref Range Status  . Color, UA 04/04/2016 yellow   Final  . Clarity, UA 04/04/2016 clear   Final  . Glucose, UA 04/04/2016 neg   Final  . Bilirubin, UA 04/04/2016 neg   Final  . Ketones, UA 04/04/2016 neg   Final  . Spec Grav, UA 04/04/2016 1.020   Final  . Blood, UA 04/04/2016 neg   Final  .  pH, UA 04/04/2016 5.5   Final  . Protein, UA 04/04/2016 neg   Final  . Urobilinogen, UA 04/04/2016 negative   Final  . Nitrite, UA 04/04/2016 neg   Final  . Leukocytes, UA 04/04/2016 large (3+)* Negative Final  . Organism ID, Bacteria 04/04/2016 NO GROWTH   Final    Speciality Comments:  No specialty comments available.    Procedures:  No procedures performed Allergies: Buspirone hcl; Simvastatin; and Sulfa antibiotics   Assessment / Plan:     Visit Diagnoses: Fibromyalgia  Osteopenia of multiple sites  Primary insomnia  Other fatigue - Plan: CK, C3 and C4, ANA, ENA 9 Panel, Sedimentation rate, Pan-ANCA  Vitamin D deficiency  S/P laparoscopic cholecystectomy June 2017  Primary osteoarthritis of both knees  History of migraine  History of IBS  History of hyperlipidemia  History of hyperglycemia  History of hypertension  History of depression  History of recurrent cystitis (IC)  Pelvic floor dysfunction  Dermatitis - Plan: CK, C3 and C4, ANA, ENA 9 Panel, Sedimentation rate, Pan-ANCA  Autoimmune disease ([place]) - Plan: CK, C3 and C4, ANA, ENA 9 Panel, Sedimentation rate, Pan-ANCA   Plan: 1)  Fms  2) due to recurrent rash to her body, oral ulcers off and on, severe fatigue,  rash on face and eyelids, we will do the following labs ANA, ENA, C3-C4, sedimentation rate, CK, p-ANCA. Patient is already done CBC with differential, CMP with GFR, TSH  #3: Patient was told to stop the Flexeril due to the interference with Cymbalta. She is requesting an alternative to the Flexeril since tizanidine cause problems. We discussed use of trazodone and patient is agreeable. We will do trial of this so we will do 50 mg daily at bedtime; 30 day supply with 2 refills. If this medication once properly for the patient, I'm happy to do a 90 day supply future  #4: Return to clinic in 5 months  #5: Continue water aerobic exercises if possible otherwise do exercises including  yoga.  #6: No other med refills needed at this time.   Orders: Orders Placed This Encounter  Procedures  . CK  . C3 and C4  . ANA  . ENA 9 Panel  . Sedimentation rate  . Pan-ANCA   Meds ordered this encounter  Medications  . traZODone (DESYREL) 50 MG tablet    Sig: Take 1 tablet (50 mg total) by mouth at bedtime.    Dispense:  30 tablet    Refill:  2    Order Specific Question:   Supervising Provider    Answer:   Bo Merino [phone]    Face-to-face time spent with patient was 30 minutes. 50% of time was spent in counseling and coordination of care.  Follow-Up Instructions: Return in about 5 months (around 01/29/2017) for FMS, FATIGUE,INSOMNIA,dermatitis,oral ulcers.   Eliezer Lofts, PA-C  I examined and evaluated the patient with Eliezer Lofts PA. The plan of care was discussed as noted above.  Bo Merino, MD  Note - This record has been created using Editor, commissioning.  Chart creation errors have been sought, but may not always  have been located. Such creation errors do not reflect on  the standard of medical care.

## 2016-08-19 ENCOUNTER — Ambulatory Visit: Payer: Medicare Other | Admitting: Rheumatology

## 2016-08-29 ENCOUNTER — Encounter: Payer: Self-pay | Admitting: Rheumatology

## 2016-08-29 ENCOUNTER — Ambulatory Visit (INDEPENDENT_AMBULATORY_CARE_PROVIDER_SITE_OTHER): Payer: Medicare Other | Admitting: Rheumatology

## 2016-08-29 VITALS — BP 110/72 | HR 67 | Resp 14 | Ht 63.0 in | Wt 173.0 lb

## 2016-08-29 DIAGNOSIS — Z8719 Personal history of other diseases of the digestive system: Secondary | ICD-10-CM

## 2016-08-29 DIAGNOSIS — R5383 Other fatigue: Secondary | ICD-10-CM | POA: Diagnosis not present

## 2016-08-29 DIAGNOSIS — D8989 Other specified disorders involving the immune mechanism, not elsewhere classified: Secondary | ICD-10-CM | POA: Diagnosis not present

## 2016-08-29 DIAGNOSIS — E559 Vitamin D deficiency, unspecified: Secondary | ICD-10-CM | POA: Diagnosis not present

## 2016-08-29 DIAGNOSIS — M170 Bilateral primary osteoarthritis of knee: Secondary | ICD-10-CM | POA: Diagnosis not present

## 2016-08-29 DIAGNOSIS — M797 Fibromyalgia: Secondary | ICD-10-CM | POA: Diagnosis not present

## 2016-08-29 DIAGNOSIS — Z87440 Personal history of urinary (tract) infections: Secondary | ICD-10-CM

## 2016-08-29 DIAGNOSIS — Z8659 Personal history of other mental and behavioral disorders: Secondary | ICD-10-CM | POA: Diagnosis not present

## 2016-08-29 DIAGNOSIS — Z9049 Acquired absence of other specified parts of digestive tract: Secondary | ICD-10-CM | POA: Diagnosis not present

## 2016-08-29 DIAGNOSIS — M8589 Other specified disorders of bone density and structure, multiple sites: Secondary | ICD-10-CM

## 2016-08-29 DIAGNOSIS — Z8679 Personal history of other diseases of the circulatory system: Secondary | ICD-10-CM

## 2016-08-29 DIAGNOSIS — F5101 Primary insomnia: Secondary | ICD-10-CM

## 2016-08-29 DIAGNOSIS — L309 Dermatitis, unspecified: Secondary | ICD-10-CM

## 2016-08-29 DIAGNOSIS — M6289 Other specified disorders of muscle: Secondary | ICD-10-CM

## 2016-08-29 DIAGNOSIS — Z8639 Personal history of other endocrine, nutritional and metabolic disease: Secondary | ICD-10-CM | POA: Diagnosis not present

## 2016-08-29 DIAGNOSIS — M359 Systemic involvement of connective tissue, unspecified: Secondary | ICD-10-CM

## 2016-08-29 DIAGNOSIS — Z8669 Personal history of other diseases of the nervous system and sense organs: Secondary | ICD-10-CM

## 2016-08-29 MED ORDER — TRAZODONE HCL 50 MG PO TABS: 50.0000 mg | ORAL_TABLET | Freq: Every day | ORAL | 2 refills | Status: DC

## 2016-08-29 NOTE — Progress Notes (Signed)
Patient was prescribed trazodone for insomnia.  Counseled patient on the purpose, proper use, and adverse effects of trazodone.  Discussed risk of serotonin syndrome with duloxetine and trazodone and counseled patient on signs and symptoms of serotonin syndrome.  Counseled patient to stop medication and seek medical care if those symptoms occur.  Patient voiced understanding and denies any questions or concerns regarding her medications at this time.   Elisabeth Most, Pharm.D., BCPS, CPP Clinical Pharmacist Pager: [phone] Phone: [phone] 08/29/2016 6:16 PM

## 2016-08-29 NOTE — Addendum Note (Signed)
Addended byEliezer Lofts on: 08/29/2016 03:35 PM   Modules accepted: Orders

## 2016-08-30 LAB — C3 AND C4
C3 COMPLEMENT: 166 mg/dL (ref 83–193)
C4 Complement: 25 mg/dL (ref 15–57)

## 2016-08-30 LAB — CK: CK TOTAL: 49 U/L (ref 29–143)

## 2016-08-30 LAB — CP5000020 ENA PANEL
ENA SM Ab Ser-aCnc: <1
Ribonucleic Protein(ENA) Antibody, IgG: <1
SCLERODERMA (SCL-70) (ENA) ANTIBODY, IGG: NEGATIVE
SSA (RO) (ENA) ANTIBODY, IGG: NEGATIVE
SSB (LA) (ENA) ANTIBODY, IGG: NEGATIVE

## 2016-08-30 LAB — PAN-ANCA
ANCA Screen: NEGATIVE
Myeloperoxidase Abs: <1
Serine Protease 3: <1

## 2016-08-30 LAB — ANA: ANA: NEGATIVE

## 2016-08-30 LAB — SEDIMENTATION RATE: SED RATE: 26 mm/h (ref 0–30)

## 2016-09-04 ENCOUNTER — Telehealth: Payer: Self-pay | Admitting: Rheumatology

## 2016-09-04 ENCOUNTER — Encounter: Payer: Self-pay | Admitting: Family Medicine

## 2016-09-04 ENCOUNTER — Ambulatory Visit (INDEPENDENT_AMBULATORY_CARE_PROVIDER_SITE_OTHER): Payer: Medicare Other | Admitting: Family Medicine

## 2016-09-04 VITALS — BP 114/78 | HR 71 | Temp 98.3°F | Ht 63.0 in | Wt 171.5 lb

## 2016-09-04 DIAGNOSIS — R21 Rash and other nonspecific skin eruption: Secondary | ICD-10-CM

## 2016-09-04 NOTE — Telephone Encounter (Signed)
Patient returned your call.  CB#[id number].  Thank you.

## 2016-09-04 NOTE — Progress Notes (Signed)
Subjective:    Patient ID: Lindsey [name], female    DOB: 01-19-1956, 61 y.o.   MRN: [id number]  HPI Here for rash on eyelid and neck and headache  It started on on neck-mostly on L side- very red  Eyelids were swollen and raw (scale in creases) -very itchy and burning  R side of face was a bit swollen ?  Made her whole head hurt    Saw Dr Denman George her to have it looked at  Did some rheumatoid labs that were ok   Worst day Saturday Improved on Sunday   No makeup  No new products  Washes face with argan oil cleanser - not new   No recent bites No new foods   No nail polish contact at all   Patient Active Problem List   Diagnosis Date Noted  . Rash and nonspecific skin eruption 09/04/2016  . History of recurrent cystitis (IC) 08/16/2016  . Pelvic floor dysfunction 08/16/2016  . Dysuria 05/24/2016  . Macular degeneration 04/09/2016  . Osteoarthritis, knee 02/15/2016  . S/P laparoscopic cholecystectomy June 2017 09/26/2015  . RUQ pain 08/09/2015  . Nausea without vomiting 08/09/2015  . Dermatitis 10/31/2014  . Hyperglycemia 12/24/2013  . Encounter for Medicare annual wellness exam 09/04/2012  . HSV (herpes simplex virus) infection 04/08/2012  . IBS (irritable bowel syndrome) 10/21/2011  . Routine general medical examination at a health care facility 07/02/2011  . Osteopenia 05/17/2009  . DEPRESSION 10/05/2008  . GERD 05/05/2008  . OBSTRUCTIVE SLEEP APNEA 06/25/2007  . Hyperlipidemia 05/31/2007  . Essential hypertension 05/31/2007  . Other fatigue 04/23/2007  . Vitamin D deficiency 01/22/2007  . MIGRAINE HEADACHE 01/22/2007  . CYSTITIS, CHRONIC INTERSTITIAL 01/22/2007  . MENOPAUSE-RELATED VASOMOTOR SYMPTOMS 01/22/2007  . OSTEOARTHROSIS, GENERALIZED, MULTIPLE SITES 01/22/2007  . Fibromyalgia 01/22/2007  . PATENT FORAMEN OVALE 01/22/2007   Past Medical History:  Diagnosis Date  . Arthritis   . Chronic fatigue   . Fibromyalgia   . Hemorrhoids   .  Hyperlipidemia   . Hypersomnia   . Hypertension   . IBS (irritable bowel syndrome)   . Interstitial cystitis   . Migraines   . Osteopenia   . Pelvic floor dysfunction   . PFO (patent foramen ovale)   . Tendonitis    hand  . Vitamin D deficiency    Past Surgical History:  Procedure Laterality Date  . ANKLE ARTHROSCOPY Right   . ANKLE FRACTURE SURGERY Left   . BLADDER SURGERY     and hydrodistension  . BREAST LUMPECTOMY     many, bilateral  . CERVICAL DISCECTOMY  2006   fusion and plating  . CHOLECYSTECTOMY N/A 09/26/2015   Procedure: LAPAROSCOPIC CHOLECYSTECTOMY WITH INTRAOPERATIVE CHOLANGIOGRAM;  Surgeon: Johnathan Hausen, MD;  Location: [facility] ORS;  Service: General;  Laterality: N/A;  . COLONOSCOPY    . FRACTURE SURGERY     ankle fracture x 2  . NASAL SINUS SURGERY     multiple, x4  . UPPER GASTROINTESTINAL ENDOSCOPY    . VAGINAL HYSTERECTOMY  1990   because of the IC partial   Social History  Substance Use Topics  . Smoking status: Never Smoker  . Smokeless tobacco: Never Used  . Alcohol use No   Family History  Problem Relation Age of Onset  . Diabetes Father   . Alcohol abuse Father   . Heart disease Father   . Fibromyalgia Mother   . Colon cancer Paternal Uncle   . Stomach cancer Paternal Grandfather   .  Diabetes Paternal Grandfather   . Diabetes Paternal Grandmother   . Stroke Paternal Grandmother    Allergies  Allergen Reactions  . Buspirone Hcl     REACTION: dystonia  . Simvastatin     REACTION: malaise  . Sulfa Antibiotics     nausea   Current Outpatient Prescriptions on File Prior to Visit  Medication Sig Dispense Refill  . atenolol (TENORMIN) 100 MG tablet Take 1 tablet (100 mg total) by mouth daily. 90 tablet 3  . baclofen (LIORESAL) 10 MG tablet Take 10 mg by mouth 3 (three) times daily as needed.    . botulinum toxin Type A (BOTOX) 100 units SOLR injection Inject 200 Units into the muscle once.     . cholecalciferol (VITAMIN D) 1000 UNITS  tablet Take 2,000 Units by mouth 2 (two) times daily.     . diclofenac sodium (VOLTAREN) 1 % GEL Apply 3 g topically 3 (three) times daily as needed.  3  . dicyclomine (BENTYL) 20 MG tablet Take one tab before meals (Patient taking differently: Take 20 mg by mouth 3 (three) times daily as needed for spasms. Take one tab before meals) 90 tablet 5  . fentaNYL (DURAGESIC - DOSED MCG/HR) 50 MCG/HR Place 50 mcg onto the skin every other day.     Marland Kitchen FLUARIX QUADRIVALENT 0.5 ML injection     . hydrOXYzine (ATARAX/VISTARIL) 25 MG tablet     . lidocaine (LIDODERM) 5 % Place onto the skin.    Marland Kitchen nitrofurantoin (MACRODANTIN) 50 MG capsule Take 50 mg by mouth daily.    . pentosan polysulfate (ELMIRON) 100 MG capsule Take 200 mg by mouth 2 (two) times daily.     . traZODone (DESYREL) 50 MG tablet Take 1 tablet (50 mg total) by mouth at bedtime. 30 tablet 2  . DULoxetine (CYMBALTA) 60 MG capsule Take 1 capsule (60 mg total) by mouth daily. 90 capsule 1   No current facility-administered medications on file prior to visit.     Review of Systems  Constitutional: Positive for fatigue. Negative for activity change, appetite change, fever and unexpected weight change.  HENT: Negative for congestion, ear pain, rhinorrhea, sinus pressure and sore throat.   Eyes: Negative for photophobia, pain, discharge, redness and visual disturbance.  Respiratory: Negative for cough, shortness of breath and wheezing.   Cardiovascular: Negative for chest pain and palpitations.  Gastrointestinal: Negative for abdominal pain, blood in stool, constipation, diarrhea and nausea.  Endocrine: Negative for polydipsia and polyuria.  Genitourinary: Positive for dysuria and pelvic pain. Negative for frequency and urgency.       Baseline IC symptoms  Musculoskeletal: Negative for arthralgias, back pain and myalgias.  Skin: Positive for rash. Negative for pallor and wound.  Allergic/Immunologic: Negative for environmental allergies.    Neurological: Negative for dizziness, syncope and headaches.  Hematological: Negative for adenopathy. Does not bruise/bleed easily.  Psychiatric/Behavioral: Negative for decreased concentration and dysphoric mood. The patient is not nervous/anxious.        Objective:   Physical Exam  Constitutional: She appears well-developed and well-nourished. No distress.  HENT:  Head: Normocephalic and atraumatic.  Right Ear: External ear normal.  Left Ear: External ear normal.  Nose: Nose normal.  Mouth/Throat: Oropharynx is clear and moist.  Eyes: Conjunctivae and EOM are normal. Pupils are equal, round, and reactive to light. Right eye exhibits no discharge. Left eye exhibits no discharge. No scleral icterus.  Neck: Normal range of motion. Neck supple.  Cardiovascular: Normal rate and regular  rhythm.   Pulmonary/Chest: Effort normal and breath sounds normal. She has no wheezes.  Lymphadenopathy:    She has no cervical adenopathy.  Skin: Skin is warm and dry. Rash noted.  Mild scale both upper eyelids w/o erythema or swelling  Resolving papular rash on neck (L more than R)  No vesicles or hives No insect bites   Psychiatric: She has a normal mood and affect.          Assessment & Plan:   Problem List Items Addressed This Visit      Musculoskeletal and Integument   Rash and nonspecific skin eruption    Pt has scale on eyelids (improved) with itch Papular rash on neck (mostly L side) also improved  Suspect allergic type of dermatitis Rev exposures (no nail polish) Also foods Will keep track/ avoid scents  Hydrocortisone on neck Keep cool Update if not starting to improve in a week or if worsening

## 2016-09-04 NOTE — Assessment & Plan Note (Signed)
Pt has scale on eyelids (improved) with itch Papular rash on neck (mostly L side) also improved  Suspect allergic type of dermatitis Rev exposures (no nail polish) Also foods Will keep track/ avoid scents  Hydrocortisone on neck Keep cool Update if not starting to improve in a week or if worsening

## 2016-09-04 NOTE — Patient Instructions (Signed)
Your rash seems to be getting better  I think it is allergic   Use hydrocortisone cream on rash on your neck Keep cool - no hot showers or hot tubs Alert [place] if it gets worse  Use non scented products - soaps/ detergents etc  Change to dove soap for sensitive skin for a while   For eyelids- cool compress when bothersome  Keep clean and dry  vaseline is ok to use in small amounts   Keep track of foods if the rash flares - to see if you could be allergic  For example - ? Strawberries

## 2016-09-04 NOTE — Telephone Encounter (Signed)
Patient advised of lab results and verbalized understanding.  

## 2016-09-15 ENCOUNTER — Other Ambulatory Visit: Payer: Self-pay | Admitting: Rheumatology

## 2016-09-16 NOTE — Telephone Encounter (Signed)
Last Visit: 08/29/16 Next Visit: 12/03/16  Okay to refill Cymbalta?

## 2016-09-24 DIAGNOSIS — R350 Frequency of micturition: Secondary | ICD-10-CM | POA: Diagnosis not present

## 2016-09-24 DIAGNOSIS — R3915 Urgency of urination: Secondary | ICD-10-CM | POA: Diagnosis not present

## 2016-09-24 DIAGNOSIS — M797 Fibromyalgia: Secondary | ICD-10-CM | POA: Diagnosis not present

## 2016-09-24 DIAGNOSIS — N3010 Interstitial cystitis (chronic) without hematuria: Secondary | ICD-10-CM | POA: Diagnosis not present

## 2016-10-18 ENCOUNTER — Other Ambulatory Visit: Payer: Self-pay | Admitting: *Deleted

## 2016-10-18 MED ORDER — TRAZODONE HCL 50 MG PO TABS: 50.0000 mg | ORAL_TABLET | Freq: Every day | ORAL | 0 refills | Status: DC

## 2016-10-18 NOTE — Telephone Encounter (Signed)
Last Visit: 08/29/16 Next Visit: 12/03/16  Okay to refill Trazodone?

## 2016-10-24 DIAGNOSIS — M542 Cervicalgia: Secondary | ICD-10-CM | POA: Diagnosis not present

## 2016-10-24 DIAGNOSIS — G43719 Chronic migraine without aura, intractable, without status migrainosus: Secondary | ICD-10-CM | POA: Diagnosis not present

## 2016-10-24 DIAGNOSIS — G518 Other disorders of facial nerve: Secondary | ICD-10-CM | POA: Diagnosis not present

## 2016-10-24 DIAGNOSIS — M791 Myalgia: Secondary | ICD-10-CM | POA: Diagnosis not present

## 2016-11-21 ENCOUNTER — Encounter: Payer: Self-pay | Admitting: Rheumatology

## 2016-11-21 ENCOUNTER — Ambulatory Visit (INDEPENDENT_AMBULATORY_CARE_PROVIDER_SITE_OTHER): Payer: Medicare Other | Admitting: Rheumatology

## 2016-11-21 VITALS — BP 118/70 | HR 78 | Resp 14 | Ht 63.0 in | Wt 170.0 lb

## 2016-11-21 DIAGNOSIS — F5101 Primary insomnia: Secondary | ICD-10-CM | POA: Diagnosis not present

## 2016-11-21 DIAGNOSIS — R5383 Other fatigue: Secondary | ICD-10-CM

## 2016-11-21 DIAGNOSIS — L309 Dermatitis, unspecified: Secondary | ICD-10-CM

## 2016-11-21 DIAGNOSIS — M797 Fibromyalgia: Secondary | ICD-10-CM | POA: Diagnosis not present

## 2016-11-21 NOTE — Progress Notes (Signed)
Office Visit Note  Patient: Lindsey [name]             Date of Birth: Nov 10, 1955           MRN: [id number]             PCP: Abner Greenspan, MD Referring: Tower, Wynelle Fanny, MD Visit Date: 11/21/2016 Occupation: @GUAROCC @    Subjective:  Medication Management   History of Present Illness: Lindsey [name] is a 61 y.o. female   Was last seen in our office on 08/29/2016 for fibromyalgia. During that visit, patient reported that her fibromyalgia was 5 on a scale of 0-10. Her fatigue was 8 on a scale of 0-10. In addition to the above, she reported that she was having an unusual rash going on for some time, (rash to her scalp, sores in her mouth that comes and go, rash to her eyelids bilaterally, rash to her chest and anterior neck, rash to her right foot. Please see photographs taken at the last office visit.  We did autoimmune workup and it all came back negative. Please see investigations below for details on the labs.  We have reviewed rest of the patient to return today so reevaluate her and see how she is progressing.  Today,pt reports that rash resolved after using probiotic skin care.   Rash still to neck and right ear as well as belly button, and creases. Pt still has sores in mouth that come and go. The sores makes it uncomfortable for patient to eat.  Pt has not seen dermatologist.  Will try to get an appt soon.  FMS is "6" and fatigue is "8". Poor sleep because trazadone causing pt to snore and "having psychedelic effects".  Also c/o of pain to right side of body and chest. (muscular in nature). No falls or injury. No assoc sob or diaphoresis.  Activities of Daily Living:  Patient reports morning stiffness for 30 minutes.   Patient Reports nocturnal pain.  Difficulty dressing/grooming: Reports Difficulty climbing stairs: Reports Difficulty getting out of chair: Reports Difficulty using hands for taps, buttons, cutlery, and/or writing: Reports   Review of Systems    Constitutional: Positive for fatigue.  HENT: Negative for mouth sores and mouth dryness.   Eyes: Negative for dryness.  Respiratory: Negative for shortness of breath.   Gastrointestinal: Negative for constipation and diarrhea.  Musculoskeletal: Positive for myalgias and myalgias.  Skin: Negative for sensitivity to sunlight.  Psychiatric/Behavioral: Positive for sleep disturbance. Negative for decreased concentration.    PMFS History:  Patient Active Problem List   Diagnosis Date Noted  . Rash and nonspecific skin eruption 09/04/2016  . History of recurrent cystitis (IC) 08/16/2016  . Pelvic floor dysfunction 08/16/2016  . Dysuria 05/24/2016  . Macular degeneration 04/09/2016  . Osteoarthritis, knee 02/15/2016  . S/P laparoscopic cholecystectomy June 2017 09/26/2015  . RUQ pain 08/09/2015  . Nausea without vomiting 08/09/2015  . Dermatitis 10/31/2014  . Hyperglycemia 12/24/2013  . Encounter for Medicare annual wellness exam 09/04/2012  . HSV (herpes simplex virus) infection 04/08/2012  . IBS (irritable bowel syndrome) 10/21/2011  . Routine general medical examination at a health care facility 07/02/2011  . Osteopenia 05/17/2009  . DEPRESSION 10/05/2008  . GERD 05/05/2008  . OBSTRUCTIVE SLEEP APNEA 06/25/2007  . Hyperlipidemia 05/31/2007  . Essential hypertension 05/31/2007  . Other fatigue 04/23/2007  . Vitamin D deficiency 01/22/2007  . MIGRAINE HEADACHE 01/22/2007  . CYSTITIS, CHRONIC INTERSTITIAL 01/22/2007  . MENOPAUSE-RELATED VASOMOTOR SYMPTOMS 01/22/2007  .  OSTEOARTHROSIS, GENERALIZED, MULTIPLE SITES 01/22/2007  . Fibromyalgia 01/22/2007  . PATENT FORAMEN OVALE 01/22/2007    Past Medical History:  Diagnosis Date  . Arthritis   . Chronic fatigue   . Fibromyalgia   . Hemorrhoids   . Hyperlipidemia   . Hypersomnia   . Hypertension   . IBS (irritable bowel syndrome)   . Interstitial cystitis   . Migraines   . Osteopenia   . Pelvic floor dysfunction   . PFO  (patent foramen ovale)   . Tendonitis    hand  . Vitamin D deficiency     Family History  Problem Relation Age of Onset  . Diabetes Father   . Alcohol abuse Father   . Heart disease Father   . Fibromyalgia Mother   . Colon cancer Paternal Uncle   . Stomach cancer Paternal Grandfather   . Diabetes Paternal Grandfather   . Diabetes Paternal Grandmother   . Stroke Paternal Grandmother    Past Surgical History:  Procedure Laterality Date  . ANKLE ARTHROSCOPY Right   . ANKLE FRACTURE SURGERY Left   . BLADDER SURGERY     and hydrodistension  . BREAST LUMPECTOMY     many, bilateral  . CERVICAL DISCECTOMY  2006   fusion and plating  . CHOLECYSTECTOMY N/A 09/26/2015   Procedure: LAPAROSCOPIC CHOLECYSTECTOMY WITH INTRAOPERATIVE CHOLANGIOGRAM;  Surgeon: Johnathan Hausen, MD;  Location: [facility] ORS;  Service: General;  Laterality: N/A;  . COLONOSCOPY    . FRACTURE SURGERY     ankle fracture x 2  . NASAL SINUS SURGERY     multiple, x4  . UPPER GASTROINTESTINAL ENDOSCOPY    . VAGINAL HYSTERECTOMY  1990   because of the IC partial   Social History   Social History Narrative  . No narrative on file     Objective: Vital Signs: BP 118/70   Pulse 78   Resp 14   Ht 5\' 3"  (1.6 m)   Wt 170 lb (77.1 kg)   BMI 30.11 kg/m    Physical Exam  Constitutional: She is oriented to person, place, and time. She appears well-developed and well-nourished.  HENT:  Head: Normocephalic and atraumatic.  Eyes: Pupils are equal, round, and reactive to light. EOM are normal.  Cardiovascular: Normal rate, regular rhythm and normal heart sounds.  Exam reveals no gallop and no friction rub.   No murmur heard. Pulmonary/Chest: Effort normal and breath sounds normal. She has no wheezes. She has no rales.  Abdominal: Soft. Bowel sounds are normal. She exhibits no distension. There is no tenderness. There is no guarding. No hernia.  Musculoskeletal: Normal range of motion. She exhibits no edema, tenderness or  deformity.  Lymphadenopathy:    She has no cervical adenopathy.  Neurological: She is alert and oriented to person, place, and time. Coordination normal.  Skin: Skin is warm and dry. Capillary refill takes less than 2 seconds. No rash noted.  Psychiatric: She has a normal mood and affect. Her behavior is normal.  Nursing note and vitals reviewed.    Musculoskeletal Exam:  From of all joints Grip strength equal and strong bilaterally Fms 18 out of 18 tender points. (significant pain to bilateral trapezius muscles --> pt declines cortisone injection).  CDAI Exam: No CDAI exam completed.  No synovitis on exam.  Investigation: No additional findings. Office Visit on 08/29/2016  Component Date Value Ref Range Status  . Total CK 08/29/2016 49  29 - 143 U/L Final   Comment: ** Please note change  in reference range(s). **     . C3 Complement 08/29/2016 166  83 - 193 mg/dL Final  . C4 Complement 08/29/2016 25  15 - 57 mg/dL Final  . Anit Nuclear Antibody(ANA) 08/29/2016 NEG  NEGATIVE Final  . Sed Rate 08/29/2016 26  0 - 30 mm/hr Final  . ANCA Screen 08/29/2016 Negative   Final   Comment:             ** Normal Reference Range: Negative **   ANCA Screen includes evaluation for p-ANCA, c-ANCA and Atypical p-ANCA.   Marland Kitchen Myeloperoxidase Abs 08/29/2016 <1.0  <1.0 AI Final   Comment:                                 Value   Interpretation                              <1.0 AI: No Antibody Detected                           >or=1.0 AI: Antibody Detected   Autoantibodies to myeloperoxidase (MPO) are commonly associated with the following small-vessel vasculitides: microscopic polyangiitis, polyarteritis nodosa, Churg-Strauss syndrome, necrotizing and crescentic glomerulonephritis and occasionally Wegener's granulomatosis. The perinuclear IFA pattern, (p-ANCA), is based largely on autoantibody to myeloperoxidase which serves as the primary antigen   These autoantibodies are present in  active disease state.     . Serine Protease 3 08/29/2016 <1.0  <1.0 AI Final   Comment:                                 Value   Interpretation                              <1.0 AI: No Antibody Detected                           >or=1.0 AI: Antibody Detected   Autoantibodies to proteinase-3 (PR-3) are accepted as characteristic for granulomatosis with polyangiitis (Wegener's), and are detectable in 95% of the histologically proven cases. The cytoplasmic IFA pattern, (c-ANCA), is based largely on autoantibody to PR-3 which serves as the primary antigen.   These autoantibodies are present in active disease state.     . ds DNA Ab 08/29/2016 <1  IU/mL Final   Comment:                                 IU/mL       Interpretation                               < or = 4    Negative                               5-9         Indeterminate                               >  or = 10   Positive     . Ribonucleic Protein(ENA) Antibody,* 08/29/2016 <1.0 NEG  <1.0 NEG AI Final  . Scleroderma (Scl-70) (ENA) Antibod* 08/29/2016 <1.0 NEG  <1.0 NEG AI Final  . SSA (Ro) (ENA) Antibody, IgG 08/29/2016 <1.0 NEG  <1.0 NEG AI Final  . SSB (La) (ENA) Antibody, IgG 08/29/2016 <1.0 NEG  <1.0 NEG AI Final  . ENA SM Ab Ser-aCnc 08/29/2016 <1.0 NEG  <1.0 NEG AI Final   After we received results noted above, we sent the following information to the patient:===> Notes recorded by Eliezer Lofts, PA-C on 08/30/2016 at 3:54 PM EDT Please tell patient All of her autoimmune workup was negative. Please send a copy of these labs to her, PCP as well. ENA, p-ANCA, sedimentation rate, CK, C3 and C4, ANA were all negative.  Based on above, patient does not show any evidence of lupus. Have patient return to clinic in 3 months so we can monitor her carefully.  Imaging: No results found.  Speciality Comments: No specialty comments available.    Procedures:  No procedures performed Allergies: Buspirone hcl;  Simvastatin; and Sulfa antibiotics   Assessment / Plan:     Visit Diagnoses: Fibromyalgia  Other fatigue  Primary insomnia  Dermatitis - will f-u w/ derm. advised to make appt last offfice visit, pt given october appt; pt declined appt since it was not quicker. now, will call again to make appt .   Plan: #1: Fibromyalgia. Active disease with Joyce pain and positive tender points are 18 out of 18. Patient was advised that water aerobics can be very beneficial to fibromyalgia. Patient states that she does not like to go into the water. It makes her skin hurt. She is tried it before and it did not help her at all.   #2: Fatigue and insomnia. Because patient is on Cymbalta, and because of the risk of serotonin syndrome, we have discussed the patient from using Zanaflex and cyclobenzaprine. In its place, we gave the patient trazodone. Patient reports her trazodone makes her some more so loud that she cannot sleep well at night. It also gives her psychedelic dreams. Therefore she discontinued the medication on her own. I advised her to try over-the-counter melatonin  #3: Autoimmune workup done May 2018 was negative. We did the workup because of patient's symptoms of rash, oral ulcers, fatigue.  The rash on her face is gone after using probiotic cream. The rash on her neck, umbilicus, right ear area is still present. Patient was advised to make an appointment with dermatologist with last visit. When she called our office, she was given appointment October 2018. She felt that that was not quick enough and decided not to make the appointment. Note that the rash continues, she would've benefited from October appointment. I've asked her to make an appointment so they can evaluate her and treat the rash.  #4: Return to clinic in 6. No labs are needed at this time. No meds need to be refilled at this time.  Orders: No orders of the defined types were placed in this encounter.  No orders of  the defined types were placed in this encounter.   Face-to-face time spent with patient was 30 minutes. 50% of time was spent in counseling and coordination of care.  Follow-Up Instructions: Return in about 6 months (around 05/24/2017) for [place], [place].   Eliezer Lofts, PA-C  Note - This record has been created using [employer].  Chart creation errors have been  sought, but may not always  have been located. Such creation errors do not reflect on  the standard of medical care.

## 2016-12-03 ENCOUNTER — Ambulatory Visit: Payer: Medicare Other | Admitting: Rheumatology

## 2016-12-18 ENCOUNTER — Other Ambulatory Visit: Payer: Self-pay | Admitting: Family Medicine

## 2016-12-24 DIAGNOSIS — N3010 Interstitial cystitis (chronic) without hematuria: Secondary | ICD-10-CM | POA: Diagnosis not present

## 2016-12-24 DIAGNOSIS — M797 Fibromyalgia: Secondary | ICD-10-CM | POA: Diagnosis not present

## 2017-01-22 DIAGNOSIS — M791 Myalgia, unspecified site: Secondary | ICD-10-CM | POA: Diagnosis not present

## 2017-01-22 DIAGNOSIS — G43719 Chronic migraine without aura, intractable, without status migrainosus: Secondary | ICD-10-CM | POA: Diagnosis not present

## 2017-01-22 DIAGNOSIS — G518 Other disorders of facial nerve: Secondary | ICD-10-CM | POA: Diagnosis not present

## 2017-01-22 DIAGNOSIS — M542 Cervicalgia: Secondary | ICD-10-CM | POA: Diagnosis not present

## 2017-01-28 ENCOUNTER — Other Ambulatory Visit: Payer: Self-pay | Admitting: Rheumatology

## 2017-01-28 ENCOUNTER — Other Ambulatory Visit: Payer: Self-pay | Admitting: Family Medicine

## 2017-01-28 DIAGNOSIS — Z1231 Encounter for screening mammogram for malignant neoplasm of breast: Secondary | ICD-10-CM

## 2017-01-28 NOTE — Telephone Encounter (Signed)
ok 

## 2017-01-28 NOTE — Telephone Encounter (Signed)
Last Visit: 11/21/16 Next Visit due in February 2019. Message sent to the front to schedule patient.  Okay to refill Trazodone?

## 2017-01-30 ENCOUNTER — Ambulatory Visit: Payer: Medicare Other | Admitting: Rheumatology

## 2017-02-25 ENCOUNTER — Ambulatory Visit
Admission: RE | Admit: 2017-02-25 | Discharge: 2017-02-25 | Disposition: A | Discharge status: Home / Self Care | Payer: Medicare Other | Source: Ambulatory Visit | Attending: Family Medicine | Admitting: Family Medicine

## 2017-02-25 DIAGNOSIS — Z1231 Encounter for screening mammogram for malignant neoplasm of breast: Secondary | ICD-10-CM

## 2017-03-18 DIAGNOSIS — N3010 Interstitial cystitis (chronic) without hematuria: Secondary | ICD-10-CM | POA: Diagnosis not present

## 2017-03-18 DIAGNOSIS — M797 Fibromyalgia: Secondary | ICD-10-CM | POA: Diagnosis not present

## 2017-03-18 DIAGNOSIS — K582 Mixed irritable bowel syndrome: Secondary | ICD-10-CM | POA: Diagnosis not present

## 2017-03-18 DIAGNOSIS — R5382 Chronic fatigue, unspecified: Secondary | ICD-10-CM | POA: Diagnosis not present

## 2017-04-02 ENCOUNTER — Other Ambulatory Visit: Payer: Self-pay | Admitting: Rheumatology

## 2017-04-03 NOTE — Telephone Encounter (Signed)
Last Visit: 11/21/16 Next Visit: 07/01/17  Okay to refill per Dr. Estanislado Pandy

## 2017-04-23 DIAGNOSIS — M542 Cervicalgia: Secondary | ICD-10-CM | POA: Diagnosis not present

## 2017-04-23 DIAGNOSIS — G43719 Chronic migraine without aura, intractable, without status migrainosus: Secondary | ICD-10-CM | POA: Diagnosis not present

## 2017-04-23 DIAGNOSIS — M791 Myalgia, unspecified site: Secondary | ICD-10-CM | POA: Diagnosis not present

## 2017-04-23 DIAGNOSIS — G518 Other disorders of facial nerve: Secondary | ICD-10-CM | POA: Diagnosis not present

## 2017-05-26 ENCOUNTER — Other Ambulatory Visit: Payer: Self-pay | Admitting: *Deleted

## 2017-05-26 NOTE — Telephone Encounter (Signed)
No recent or future appts., please advise  

## 2017-05-26 NOTE — Telephone Encounter (Signed)
Please schedule a spring f/u and refill until then 

## 2017-05-27 MED ORDER — ATENOLOL 100 MG PO TABS: 100.0000 mg | ORAL_TABLET | Freq: Every day | ORAL | 0 refills | Status: DC

## 2017-05-27 NOTE — Telephone Encounter (Signed)
appt scheduled and med refilled 

## 2017-05-30 DIAGNOSIS — H2513 Age-related nuclear cataract, bilateral: Secondary | ICD-10-CM | POA: Diagnosis not present

## 2017-06-12 DIAGNOSIS — R5382 Chronic fatigue, unspecified: Secondary | ICD-10-CM | POA: Diagnosis not present

## 2017-06-12 DIAGNOSIS — R300 Dysuria: Secondary | ICD-10-CM | POA: Diagnosis not present

## 2017-06-12 DIAGNOSIS — N3010 Interstitial cystitis (chronic) without hematuria: Secondary | ICD-10-CM | POA: Diagnosis not present

## 2017-06-12 DIAGNOSIS — K582 Mixed irritable bowel syndrome: Secondary | ICD-10-CM | POA: Diagnosis not present

## 2017-06-12 DIAGNOSIS — N9489 Other specified conditions associated with female genital organs and menstrual cycle: Secondary | ICD-10-CM | POA: Diagnosis not present

## 2017-06-17 ENCOUNTER — Encounter: Payer: Self-pay | Admitting: Family Medicine

## 2017-06-17 ENCOUNTER — Ambulatory Visit (INDEPENDENT_AMBULATORY_CARE_PROVIDER_SITE_OTHER): Payer: Medicare Other | Admitting: Family Medicine

## 2017-06-17 VITALS — BP 116/70 | HR 62 | Temp 98.4°F | Ht 63.0 in | Wt 172.0 lb

## 2017-06-17 DIAGNOSIS — R4589 Other symptoms and signs involving emotional state: Secondary | ICD-10-CM

## 2017-06-17 DIAGNOSIS — E7800 Pure hypercholesterolemia, unspecified: Secondary | ICD-10-CM

## 2017-06-17 DIAGNOSIS — E559 Vitamin D deficiency, unspecified: Secondary | ICD-10-CM | POA: Diagnosis not present

## 2017-06-17 DIAGNOSIS — M797 Fibromyalgia: Secondary | ICD-10-CM | POA: Diagnosis not present

## 2017-06-17 DIAGNOSIS — D8989 Other specified disorders involving the immune mechanism, not elsewhere classified: Secondary | ICD-10-CM | POA: Diagnosis not present

## 2017-06-17 DIAGNOSIS — F329 Major depressive disorder, single episode, unspecified: Secondary | ICD-10-CM

## 2017-06-17 DIAGNOSIS — G43909 Migraine, unspecified, not intractable, without status migrainosus: Secondary | ICD-10-CM | POA: Diagnosis not present

## 2017-06-17 DIAGNOSIS — K219 Gastro-esophageal reflux disease without esophagitis: Secondary | ICD-10-CM | POA: Diagnosis not present

## 2017-06-17 DIAGNOSIS — M359 Systemic involvement of connective tissue, unspecified: Secondary | ICD-10-CM

## 2017-06-17 DIAGNOSIS — R739 Hyperglycemia, unspecified: Secondary | ICD-10-CM | POA: Diagnosis not present

## 2017-06-17 DIAGNOSIS — I10 Essential (primary) hypertension: Secondary | ICD-10-CM

## 2017-06-17 DIAGNOSIS — Z23 Encounter for immunization: Secondary | ICD-10-CM | POA: Diagnosis not present

## 2017-06-17 LAB — VITAMIN D 25 HYDROXY (VIT D DEFICIENCY, FRACTURES): VITD: 40.54 ng/mL (ref 30.00–100.00)

## 2017-06-17 LAB — CBC WITH DIFFERENTIAL/PLATELET
BASOS ABS: 0.1 10*3/uL (ref 0.0–0.1)
BASOS PCT: 1 % (ref 0.0–3.0)
Eosinophils Absolute: 0.1 10*3/uL (ref 0.0–0.7)
Eosinophils Relative: 1 % (ref 0.0–5.0)
HEMATOCRIT: 42.3 % (ref 36.0–46.0)
Hemoglobin: 14 g/dL (ref 12.0–15.0)
LYMPHS PCT: 38.8 % (ref 12.0–46.0)
Lymphs Abs: 2.4 10*3/uL (ref 0.7–4.0)
MCHC: 33 g/dL (ref 30.0–36.0)
MCV: 87.8 fl (ref 78.0–100.0)
MONOS PCT: 7.9 % (ref 3.0–12.0)
Monocytes Absolute: 0.5 10*3/uL (ref 0.1–1.0)
Neutro Abs: 3.1 10*3/uL (ref 1.4–7.7)
Neutrophils Relative %: 51.3 % (ref 43.0–77.0)
Platelets: 213 10*3/uL (ref 150.0–400.0)
RBC: 4.82 Mil/uL (ref 3.87–5.11)
RDW: 15.4 % (ref 11.5–15.5)
WBC: 6.1 10*3/uL (ref 4.0–10.5)

## 2017-06-17 LAB — COMPREHENSIVE METABOLIC PANEL
ALT: 28 U/L (ref 0–35)
AST: 22 U/L (ref 0–37)
Albumin: 4 g/dL (ref 3.5–5.2)
Alkaline Phosphatase: 66 U/L (ref 39–117)
BILIRUBIN TOTAL: 0.4 mg/dL (ref 0.2–1.2)
BUN: 7 mg/dL (ref 6–23)
CALCIUM: 10.2 mg/dL (ref 8.4–10.5)
CO2: 31 mEq/L (ref 19–32)
Chloride: 100 mEq/L (ref 96–112)
Creatinine, Ser: 0.7 mg/dL (ref 0.40–1.20)
GFR: 90.52 mL/min (ref >60.00)
Glucose, Bld: 113 mg/dL — ABNORMAL HIGH (ref 70–99)
Potassium: 4.9 mEq/L (ref 3.5–5.1)
Sodium: 136 mEq/L (ref 135–145)
Total Protein: 7.5 g/dL (ref 6.0–8.3)

## 2017-06-17 LAB — LIPID PANEL
CHOL/HDL RATIO: 3
CHOLESTEROL: 209 mg/dL — AB (ref 0–200)
HDL: 61.2 mg/dL (ref >39.00)
LDL Cholesterol: 131 mg/dL — ABNORMAL HIGH (ref 0–99)
NonHDL: 147.91
TRIGLYCERIDES: 87 mg/dL (ref 0.0–149.0)
VLDL: 17.4 mg/dL (ref 0.0–40.0)

## 2017-06-17 LAB — HEMOGLOBIN A1C: Hgb A1c MFr Bld: 6.3 % (ref 4.6–6.5)

## 2017-06-17 LAB — TSH: TSH: 2.05 u[IU]/mL (ref 0.35–4.50)

## 2017-06-17 MED ORDER — ATENOLOL 100 MG PO TABS: 100.0000 mg | ORAL_TABLET | Freq: Every day | ORAL | 3 refills | Status: DC

## 2017-06-17 NOTE — Patient Instructions (Addendum)
For diabetes prevention  Try to get most of your carbohydrates from produce (with the exception of white potatoes)  Eat less bread/pasta/rice/snack foods/cereals/sweets and other items from the middle of the grocery store (processed carbs)  For cholesterol Avoid red meat/ fried foods/ egg yolks/ fatty breakfast meats/ butter, cheese and high fat dairy/ and shellfish    If you are interested in the new shingles vaccine (Shingrix) - call your local pharmacy to check on coverage and availability  I highly recommend getting on a wait list for this   Tdap vaccine today   Continue the atenolol for headaches and blood pressure

## 2017-06-17 NOTE — Progress Notes (Signed)
Subjective:    Patient ID: Lindsey [name], female    DOB: [birth year], 63 y.o.   MRN: [id number]  HPI Here for f/u of chronic medical problems   Doing about the same   More eye problems now - will have cataracts removed  Drusen syndrome and macular deg -known   Wt Readings from Last 3 Encounters:  06/17/17 172 lb (78 kg)  11/21/16 170 lb (77.1 kg)  09/04/16 171 lb 8 oz (77.8 kg)  taking care of herself - but not a very healthy diet  30.47 kg/m   Eats a pack of grits for bkfast No lunch  Dinner- 1/2 sandwich / or pasta / red meat / some pork   Not a lot of food in general due to lack of exercise   Gets heartburn easily  occ pepcid or tums   bp is stable today  No cp or palpitations or headaches or edema  No side effects to medicines  BP Readings from Last 3 Encounters:  06/17/17 116/70  11/21/16 118/70  09/04/16 114/78    Takes atenolol   Pulse Readings from Last 3 Encounters:  06/17/17 62  11/21/16 78  09/04/16 71    Myofascial pain syndrome with depression  On cymbalta from dr Garen Grams  Unable to be active or exercise  Mood has been stable   Hx of elevated blood sugar Lab Results  Component Value Date   HGBA1C 6.2 04/09/2016  eats sugar and carbs w/o paying much attention  Limited diet - and craves sweets  Has gained weight   Hx of hyperlipidemia Lab Results  Component Value Date   CHOL 185 04/09/2016   HDL 54.70 04/09/2016   LDLCALC 119 (H) 04/09/2016   LDLDIRECT 137.4 11/27/2012   TRIG 61.0 04/09/2016   CHOLHDL 3 04/09/2016  does not eat fried foods  Does eat some red meat  Also butter  No shellfish No cheese    H/o D def Level of 27 in 2010  She takes 4000 iu per day   She got her flu shot this year- got the flu anyway   Will get Tetanus shot today  Patient Active Problem List   Diagnosis Date Noted  . Rash and nonspecific skin eruption 09/04/2016  . History of recurrent cystitis (IC) 08/16/2016  . Pelvic floor dysfunction  08/16/2016  . Dysuria 05/24/2016  . Macular degeneration 04/09/2016  . Osteoarthritis, knee 02/15/2016  . S/P laparoscopic cholecystectomy June 2017 09/26/2015  . RUQ pain 08/09/2015  . Nausea without vomiting 08/09/2015  . Dermatitis 10/31/2014  . Hyperglycemia 12/24/2013  . Encounter for Medicare annual wellness exam 09/04/2012  . HSV (herpes simplex virus) infection 04/08/2012  . IBS (irritable bowel syndrome) 10/21/2011  . Routine general medical examination at a health care facility 07/02/2011  . Osteopenia 05/17/2009  . Depressed mood 10/05/2008  . GERD 05/05/2008  . OBSTRUCTIVE SLEEP APNEA 06/25/2007  . Hyperlipidemia 05/31/2007  . Essential hypertension 05/31/2007  . Other fatigue 04/23/2007  . Vitamin D deficiency 01/22/2007  . Migraine headache 01/22/2007  . CYSTITIS, CHRONIC INTERSTITIAL 01/22/2007  . MENOPAUSE-RELATED VASOMOTOR SYMPTOMS 01/22/2007  . OSTEOARTHROSIS, GENERALIZED, MULTIPLE SITES 01/22/2007  . Fibromyalgia 01/22/2007  . PATENT FORAMEN OVALE 01/22/2007   Past Medical History:  Diagnosis Date  . Arthritis   . Chronic fatigue   . Fibromyalgia   . Hemorrhoids   . Hyperlipidemia   . Hypersomnia   . Hypertension   . IBS (irritable bowel syndrome)   . Interstitial cystitis   .  Migraines   . Osteopenia   . Pelvic floor dysfunction   . PFO (patent foramen ovale)   . Tendonitis    hand  . Vitamin D deficiency    Past Surgical History:  Procedure Laterality Date  . ANKLE ARTHROSCOPY Right   . ANKLE FRACTURE SURGERY Left   . BLADDER SURGERY     and hydrodistension  . BREAST EXCISIONAL BIOPSY Bilateral    Multiple Benign Lumps removed    . BREAST LUMPECTOMY     many, bilateral  . CERVICAL DISCECTOMY  2006   fusion and plating  . CHOLECYSTECTOMY N/A 09/26/2015   Procedure: LAPAROSCOPIC CHOLECYSTECTOMY WITH INTRAOPERATIVE CHOLANGIOGRAM;  Surgeon: Johnathan Hausen, MD;  Location: [facility] ORS;  Service: General;  Laterality: N/A;  . COLONOSCOPY    .  FRACTURE SURGERY     ankle fracture x 2  . NASAL SINUS SURGERY     multiple, x4  . UPPER GASTROINTESTINAL ENDOSCOPY    . VAGINAL HYSTERECTOMY  1990   because of the IC partial   Social History   Tobacco Use  . Smoking status: Never Smoker  . Smokeless tobacco: Never Used  Substance Use Topics  . Alcohol use: No    Alcohol/week: 0.0 oz  . Drug use: No   Family History  Problem Relation Age of Onset  . Diabetes Father   . Alcohol abuse Father   . Heart disease Father   . Fibromyalgia Mother   . Colon cancer Paternal Uncle   . Stomach cancer Paternal Grandfather   . Diabetes Paternal Grandfather   . Diabetes Paternal Grandmother   . Stroke Paternal Grandmother   . Breast cancer Neg Hx    Allergies  Allergen Reactions  . Buspirone Hcl     REACTION: dystonia  . Simvastatin     REACTION: malaise  . Sulfa Antibiotics     nausea   Current Outpatient Medications on File Prior to Visit  Medication Sig Dispense Refill  . botulinum toxin Type A (BOTOX) 100 units SOLR injection Inject 200 Units into the muscle once.     . cholecalciferol (VITAMIN D) 1000 UNITS tablet Take 2,000 Units by mouth 2 (two) times daily.     . diclofenac sodium (VOLTAREN) 1 % GEL Apply 3 g topically 3 (three) times daily as needed.  3  . DULoxetine (CYMBALTA) 60 MG capsule TAKE 1 CAPSULE (60 MG TOTAL) BY MOUTH DAILY. 90 capsule 1  . fentaNYL (DURAGESIC - DOSED MCG/HR) 50 MCG/HR Place 50 mcg onto the skin every other day.     . hydrOXYzine (ATARAX/VISTARIL) 25 MG tablet     . lidocaine (LIDODERM) 5 % Place onto the skin.    Marland Kitchen nitrofurantoin (MACRODANTIN) 50 MG capsule Take 50 mg by mouth daily.    . pentosan polysulfate (ELMIRON) 100 MG capsule Take 200 mg by mouth 2 (two) times daily.     . cyclobenzaprine (FLEXERIL) 10 MG tablet Take 1 tablet by mouth 3 (three) times daily as needed.     No current facility-administered medications on file prior to visit.     Review of Systems  Constitutional:  Positive for fatigue. Negative for activity change, appetite change, fever and unexpected weight change.  HENT: Negative for congestion, ear pain, rhinorrhea, sinus pressure and sore throat.   Eyes: Positive for visual disturbance. Negative for pain and redness.  Respiratory: Negative for cough, shortness of breath and wheezing.   Cardiovascular: Negative for chest pain and palpitations.  Gastrointestinal: Negative for abdominal pain,  blood in stool, constipation and diarrhea.       Heartburn  Endocrine: Negative for polydipsia and polyuria.  Genitourinary: Positive for dysuria. Negative for frequency and urgency.       Chronic dysuria and bladder pain  Musculoskeletal: Positive for arthralgias, back pain and myalgias.       Myofascial pain syndrome  Skin: Negative for pallor and rash.  Allergic/Immunologic: Negative for environmental allergies.  Neurological: Positive for headaches. Negative for dizziness and syncope.       Improved headaches overall   Hematological: Negative for adenopathy. Does not bruise/bleed easily.  Psychiatric/Behavioral: Negative for decreased concentration and dysphoric mood. The patient is not nervous/anxious.        Objective:   Physical Exam  Constitutional: She appears well-developed and well-nourished. No distress.  overwt and fatigued appearing   HENT:  Head: Normocephalic and atraumatic.  Mouth/Throat: Oropharynx is clear and moist.  Eyes: Conjunctivae and EOM are normal. Pupils are equal, round, and reactive to light.  Neck: Normal range of motion. Neck supple. No JVD present. Carotid bruit is not present. No thyromegaly present.  Cardiovascular: Normal rate, regular rhythm, normal heart sounds and intact distal pulses. Exam reveals no gallop.  Pulmonary/Chest: Effort normal and breath sounds normal. No respiratory distress. She has no wheezes. She has no rales.  No crackles  Abdominal: Soft. Bowel sounds are normal. She exhibits no distension, no  abdominal bruit and no mass. There is tenderness.  Suprapubic tenderness baseline   Mild epigastric tenderness  Musculoskeletal: She exhibits tenderness. She exhibits no edema.  Myofascial trigger points  Labored gait due to pain   Lymphadenopathy:    She has no cervical adenopathy.  Neurological: She is alert. She has normal reflexes. No cranial nerve deficit. Coordination normal.  Skin: Skin is warm and dry. No rash noted. No pallor.  Psychiatric: She has a normal mood and affect.  Overall mood is fairly good today          Assessment & Plan:   Problem List Items Addressed This Visit      Cardiovascular and Mediastinum   Essential hypertension - Primary    bp in fair control at this time  BP Readings from Last 1 Encounters:  06/17/17 116/70   No changes needed Disc lifstyle change with low sodium diet and exercise  Continue atenolol for this and migraine Lab today      Relevant Medications   atenolol (TENORMIN) 100 MG tablet   Other Relevant Orders   CBC with Differential/Platelet (Completed)   Comprehensive metabolic panel (Completed)   Lipid panel (Completed)   TSH (Completed)   Migraine headache    Takes atenolol  Overall fairly controlled lately  Enc better diet and fluid intake       Relevant Medications   cyclobenzaprine (FLEXERIL) 10 MG tablet   atenolol (TENORMIN) 100 MG tablet     Digestive   GERD    Pepcid/tums prn Watches diet to control this primarily        Other   Autoimmune disease ([place])    ? If related to her myofascial pain  Continue f/u Dr Garen Grams      Depressed mood    Well controlled with cymbalta (which also helps pain)      Fibromyalgia    Stable  Continue f/u with Dr Garen Grams Also on chronic pain medication for IC  cymbalta  Flexeril  Enc low impact exercise as tolerated      Hyperglycemia  A1C today  disc imp of low glycemic diet and wt loss to prevent DM2       Relevant Orders   Hemoglobin A1c  (Completed)   Hyperlipidemia    Disc goals for lipids and reasons to control them Rev labs with pt  (last check)  Lipid panel today  Diet is not optimal Rev low sat fat diet in detail       Relevant Medications   atenolol (TENORMIN) 100 MG tablet   Other Relevant Orders   Lipid panel (Completed)   Vitamin D deficiency    Taking 4000 iu daily  D level today Disc imp to bone and overall health      Relevant Orders   VITAMIN D 25 Hydroxy (Vit-D Deficiency, Fractures) (Completed)    Other Visit Diagnoses    Need for Tdap vaccination       Relevant Orders   Tdap vaccine greater than or equal to 7yo IM (Completed)

## 2017-06-18 DIAGNOSIS — M359 Systemic involvement of connective tissue, unspecified: Secondary | ICD-10-CM | POA: Insufficient documentation

## 2017-06-18 NOTE — Assessment & Plan Note (Signed)
Pepcid/tums prn Watches diet to control this primarily

## 2017-06-18 NOTE — Assessment & Plan Note (Signed)
?   If related to her myofascial pain  Continue f/u Dr Garen Grams

## 2017-06-18 NOTE — Assessment & Plan Note (Signed)
bp in fair control at this time  BP Readings from Last 1 Encounters:  06/17/17 116/70   No changes needed Disc lifstyle change with low sodium diet and exercise  Continue atenolol for this and migraine Lab today

## 2017-06-18 NOTE — Assessment & Plan Note (Signed)
Taking 4000 iu daily  D level today Disc imp to bone and overall health

## 2017-06-18 NOTE — Progress Notes (Signed)
Office Visit Note  Patient: Lindsey [name]             Date of Birth: 1955/10/02           MRN: [id number]             PCP: Abner Greenspan, MD Referring: Tower, Wynelle Fanny, MD Visit Date: 07/01/2017 Occupation: @GUAROCC @    Subjective:  Generalized pain and fatigue.   History of Present Illness: Lindsey [name] is a 62 y.o. female with history of fibromyalgia syndrome.  She states she continues to have generalized pain and fatigue.  She was taking baclofen before which was ineffective.  She tried trazodone for some time which caused nightmares.  She was given Flexeril recently by Dr. Amalia Hailey which was causing side effects and she discontinued.  She would like to take some medication for insomnia and muscle spasms.  She continues to have some generalized pain and discomfort.  Increased pain is experienced with the weather change.  Activities of Daily Living:  Patient reports morning stiffness for all day hours.   Patient Reports nocturnal pain.  Difficulty dressing/grooming: Denies Difficulty climbing stairs: Reports Difficulty getting out of chair: Reports Difficulty using hands for taps, buttons, cutlery, and/or writing: Denies   Review of Systems  Constitutional: Positive for fatigue. Negative for fever, night sweats, weight gain and weight loss.  HENT: Negative for ear pain, mouth sores, trouble swallowing, trouble swallowing, mouth dryness and nose dryness.   Eyes: Negative for pain, redness, visual disturbance and dryness.  Respiratory: Negative for cough, shortness of breath and difficulty breathing.   Cardiovascular: Negative for chest pain, palpitations, hypertension, irregular heartbeat and swelling in legs/feet.  Gastrointestinal: Positive for constipation. Negative for blood in stool and diarrhea.  Endocrine: Negative for increased urination.  Genitourinary: Negative for difficulty urinating and vaginal dryness.  Musculoskeletal: Positive for arthralgias, joint pain, myalgias,  muscle weakness and myalgias. Negative for joint swelling, morning stiffness and muscle tenderness.  Skin: Negative for color change, rash, hair loss, skin tightness, ulcers and sensitivity to sunlight.  Allergic/Immunologic: Negative for susceptible to infections.  Neurological: Positive for headaches. Negative for dizziness, light-headedness, numbness, memory loss, night sweats and weakness.  Hematological: Negative for bruising/bleeding tendency and swollen glands.  Psychiatric/Behavioral: Positive for sleep disturbance. Negative for depressed mood. The patient is not nervous/anxious.     PMFS History:  Patient Active Problem List   Diagnosis Date Noted  . Autoimmune disease ([place]) 06/18/2017  . Rash and nonspecific skin eruption 09/04/2016  . History of recurrent cystitis (IC) 08/16/2016  . Pelvic floor dysfunction 08/16/2016  . Dysuria 05/24/2016  . Macular degeneration 04/09/2016  . Osteoarthritis, knee 02/15/2016  . S/P laparoscopic cholecystectomy June 2017 09/26/2015  . Dermatitis 10/31/2014  . Hyperglycemia 12/24/2013  . Encounter for Medicare annual wellness exam 09/04/2012  . HSV (herpes simplex virus) infection 04/08/2012  . IBS (irritable bowel syndrome) 10/21/2011  . Routine general medical examination at a health care facility 07/02/2011  . Osteopenia 05/17/2009  . Depressed mood 10/05/2008  . GERD 05/05/2008  . OBSTRUCTIVE SLEEP APNEA 06/25/2007  . Hyperlipidemia 05/31/2007  . Essential hypertension 05/31/2007  . Other fatigue 04/23/2007  . Vitamin D deficiency 01/22/2007  . Migraine headache 01/22/2007  . Interstitial cystitis 01/22/2007  . MENOPAUSE-RELATED VASOMOTOR SYMPTOMS 01/22/2007  . OSTEOARTHROSIS, GENERALIZED, MULTIPLE SITES 01/22/2007  . Fibromyalgia 01/22/2007  . PATENT FORAMEN OVALE 01/22/2007    Past Medical History:  Diagnosis Date  . Arthritis   . Chronic fatigue   .  Fibromyalgia   . Hemorrhoids   . Hyperlipidemia   . Hypersomnia   .  Hypertension   . IBS (irritable bowel syndrome)   . Interstitial cystitis   . Migraines   . Osteopenia   . Pelvic floor dysfunction   . PFO (patent foramen ovale)   . Tendonitis    hand  . Vitamin D deficiency     Family History  Problem Relation Age of Onset  . Diabetes Father   . Alcohol abuse Father   . Heart disease Father   . Fibromyalgia Mother   . Colon cancer Paternal Uncle   . Stomach cancer Paternal Grandfather   . Diabetes Paternal Grandfather   . Diabetes Paternal Grandmother   . Stroke Paternal Grandmother   . Breast cancer Neg Hx    Past Surgical History:  Procedure Laterality Date  . ANKLE ARTHROSCOPY Right   . ANKLE FRACTURE SURGERY Left   . BLADDER SURGERY     and hydrodistension  . BREAST EXCISIONAL BIOPSY Bilateral    Multiple Benign Lumps removed    . BREAST LUMPECTOMY     many, bilateral  . CERVICAL DISCECTOMY  2006   fusion and plating  . CHOLECYSTECTOMY N/A 09/26/2015   Procedure: LAPAROSCOPIC CHOLECYSTECTOMY WITH INTRAOPERATIVE CHOLANGIOGRAM;  Surgeon: Johnathan Hausen, MD;  Location: [facility] ORS;  Service: General;  Laterality: N/A;  . COLONOSCOPY    . FRACTURE SURGERY     ankle fracture x 2  . NASAL SINUS SURGERY     multiple, x4  . UPPER GASTROINTESTINAL ENDOSCOPY    . VAGINAL HYSTERECTOMY  1990   because of the IC partial   Social History   Social History Narrative  . Not on file     Objective: Vital Signs: BP 130/84 (BP Location: Left Arm, Patient Position: Sitting, Cuff Size: Normal)   Pulse 66   Resp 18   Ht 5\' 3"  (1.6 m)   Wt 174 lb (78.9 kg)   BMI 30.82 kg/m    Physical Exam  Constitutional: She is oriented to person, place, and time. She appears well-developed and well-nourished.  HENT:  Head: Normocephalic and atraumatic.  Eyes: Conjunctivae and EOM are normal.  Neck: Normal range of motion.  Cardiovascular: Normal rate, regular rhythm, normal heart sounds and intact distal pulses.  Pulmonary/Chest: Effort normal and  breath sounds normal.  Abdominal: Soft. Bowel sounds are normal.  Lymphadenopathy:    She has no cervical adenopathy.  Neurological: She is alert and oriented to person, place, and time.  Skin: Skin is warm and dry. Capillary refill takes less than 2 seconds.  Psychiatric: She has a normal mood and affect. Her behavior is normal.  Nursing note and vitals reviewed.    Musculoskeletal Exam: C-spine limited range of motion.  Thoracic and lumbar spine good range of motion.  Shoulder joints, elbow joints, wrist joint, MCPs and PIPs were in good range of motion with no synovitis.  She has some osteoarthritic changes in her hands with CMC thickening.  Hip joints knee joints ankles MTPs PIPs were in good range of motion with no synovitis.  He has generalized hyperalgesia.  CDAI Exam: No CDAI exam completed.    Investigation: No additional findings. CBC Latest Ref Rng & Units 06/17/2017 04/09/2016 08/09/2015  WBC 4.0 - 10.5 K/uL 6.1 8.3 6.5  Hemoglobin 12.0 - 15.0 g/dL 14.0 13.6 13.2  Hematocrit 36.0 - 46.0 % 42.3 41.0 39.9  Platelets 150.0 - 400.0 K/uL 213.0 246.0 218.0   CMP Latest  Ref Rng & Units 06/17/2017 04/09/2016 08/09/2015  Glucose 70 - 99 mg/dL 113(H) 110(H) 142(H)  BUN 6 - 23 mg/dL 7 8 8   Creatinine 0.40 - 1.20 mg/dL 0.70 0.83 0.73  Sodium 135 - 145 mEq/L 136 136 137  Potassium 3.5 - 5.1 mEq/L 4.9 4.7 4.5  Chloride 96 - 112 mEq/L 100 100 99  CO2 19 - 32 mEq/L 31 28 30   Calcium 8.4 - 10.5 mg/dL 10.2 9.7 10.0  Total Protein 6.0 - 8.3 g/dL 7.5 7.1 7.2  Total Bilirubin 0.2 - 1.2 mg/dL 0.4 0.4 0.3  Alkaline Phos 39 - 117 U/L 66 74 56  AST 0 - 37 U/L 22 17 18   ALT 0 - 35 U/L 28 20 21     Imaging: No results found.  Speciality Comments: No specialty comments available.    Procedures:  No procedures performed Allergies: Buspirone hcl; Simvastatin; and Sulfa antibiotics   Assessment / Plan:     Visit Diagnoses: Fibromyalgia: She is been having generalized pain and discomfort and  positive tender points.  She is having a flare with increased pain and discomfort with weather change.  Osteopenia of multiple sites: Her DEXA was done by her PCP.  She is on calcium and vitamin D.  Other fatigue: Related to insomnia.  Primary insomnia: Good sleep hygiene was discussed.  She is not tolerating Flexeril and wants to switch the medication.  She has discontinued Flexeril.  I have given her prescription for Zanaflex.  Side effects were discussed.  Primary osteoarthritis of both hands: She has pain and stiffness in her hands especially the left CMC joint.  I offered [place] brace but she declined.  Joint protection was discussed.  History of vitamin D deficiency: She is on supplement.  Other medical problems are listed as follows:  History of IBS  History of hypertension  History of hyperglycemia  History of depression  History of recurrent cystitis (IC)  History of hyperlipidemia  S/P laparoscopic cholecystectomy June 2017  Interstitial cystitis    Orders: No orders of the defined types were placed in this encounter.  Meds ordered this encounter  Medications  . tiZANidine (ZANAFLEX) 4 MG tablet    Sig: 1 tablet p.o. nightly as needed    Dispense:  30 tablet    Refill:  2    Face-to-face time spent with patient was 30 minutes.  Greater than 50% of time was spent in counseling and coordination of care.  Follow-Up Instructions: Return in about 6 months (around 12/31/2017) for FMS.   Bo Merino, MD  Note - This record has been created using Editor, commissioning.  Chart creation errors have been sought, but may not always  have been located. Such creation errors do not reflect on  the standard of medical care.

## 2017-06-18 NOTE — Assessment & Plan Note (Signed)
Takes atenolol  Overall fairly controlled lately  Enc better diet and fluid intake

## 2017-06-18 NOTE — Assessment & Plan Note (Signed)
A1C today  disc imp of low glycemic diet and wt loss to prevent DM2  

## 2017-06-18 NOTE — Assessment & Plan Note (Signed)
Disc goals for lipids and reasons to control them Rev labs with pt  (last check)  Lipid panel today  Diet is not optimal Rev low sat fat diet in detail

## 2017-06-18 NOTE — Assessment & Plan Note (Signed)
Stable  Continue f/u with Dr Garen Grams Also on chronic pain medication for IC  cymbalta  Flexeril  Enc low impact exercise as tolerated

## 2017-06-18 NOTE — Assessment & Plan Note (Signed)
Well controlled with cymbalta (which also helps pain)

## 2017-06-26 DIAGNOSIS — H353132 Nonexudative age-related macular degeneration, bilateral, intermediate dry stage: Secondary | ICD-10-CM | POA: Diagnosis not present

## 2017-06-26 DIAGNOSIS — H2513 Age-related nuclear cataract, bilateral: Secondary | ICD-10-CM | POA: Diagnosis not present

## 2017-07-01 ENCOUNTER — Ambulatory Visit: Payer: Medicare Other | Admitting: Rheumatology

## 2017-07-01 ENCOUNTER — Encounter: Payer: Self-pay | Admitting: Rheumatology

## 2017-07-01 VITALS — BP 130/84 | HR 66 | Resp 18 | Ht 63.0 in | Wt 174.0 lb

## 2017-07-01 DIAGNOSIS — M797 Fibromyalgia: Secondary | ICD-10-CM | POA: Diagnosis not present

## 2017-07-01 DIAGNOSIS — Z87440 Personal history of urinary (tract) infections: Secondary | ICD-10-CM

## 2017-07-01 DIAGNOSIS — R5383 Other fatigue: Secondary | ICD-10-CM

## 2017-07-01 DIAGNOSIS — Z8679 Personal history of other diseases of the circulatory system: Secondary | ICD-10-CM

## 2017-07-01 DIAGNOSIS — M19041 Primary osteoarthritis, right hand: Secondary | ICD-10-CM | POA: Diagnosis not present

## 2017-07-01 DIAGNOSIS — M8589 Other specified disorders of bone density and structure, multiple sites: Secondary | ICD-10-CM

## 2017-07-01 DIAGNOSIS — N3010 Interstitial cystitis (chronic) without hematuria: Secondary | ICD-10-CM | POA: Diagnosis not present

## 2017-07-01 DIAGNOSIS — Z8659 Personal history of other mental and behavioral disorders: Secondary | ICD-10-CM

## 2017-07-01 DIAGNOSIS — Z8719 Personal history of other diseases of the digestive system: Secondary | ICD-10-CM

## 2017-07-01 DIAGNOSIS — Z8639 Personal history of other endocrine, nutritional and metabolic disease: Secondary | ICD-10-CM

## 2017-07-01 DIAGNOSIS — M19042 Primary osteoarthritis, left hand: Secondary | ICD-10-CM

## 2017-07-01 DIAGNOSIS — Z9049 Acquired absence of other specified parts of digestive tract: Secondary | ICD-10-CM

## 2017-07-01 DIAGNOSIS — F5101 Primary insomnia: Secondary | ICD-10-CM | POA: Diagnosis not present

## 2017-07-01 MED ORDER — TIZANIDINE HCL 4 MG PO TABS: ORAL_TABLET | ORAL | 2 refills | Status: DC

## 2017-07-22 DIAGNOSIS — M791 Myalgia, unspecified site: Secondary | ICD-10-CM | POA: Diagnosis not present

## 2017-07-22 DIAGNOSIS — G518 Other disorders of facial nerve: Secondary | ICD-10-CM | POA: Diagnosis not present

## 2017-07-22 DIAGNOSIS — G43719 Chronic migraine without aura, intractable, without status migrainosus: Secondary | ICD-10-CM | POA: Diagnosis not present

## 2017-07-22 DIAGNOSIS — M542 Cervicalgia: Secondary | ICD-10-CM | POA: Diagnosis not present

## 2017-07-25 ENCOUNTER — Ambulatory Visit: Payer: Medicare Other

## 2017-08-04 ENCOUNTER — Telehealth: Payer: Self-pay | Admitting: Family Medicine

## 2017-08-04 DIAGNOSIS — E7800 Pure hypercholesterolemia, unspecified: Secondary | ICD-10-CM

## 2017-08-04 DIAGNOSIS — I10 Essential (primary) hypertension: Secondary | ICD-10-CM

## 2017-08-04 DIAGNOSIS — E559 Vitamin D deficiency, unspecified: Secondary | ICD-10-CM

## 2017-08-04 DIAGNOSIS — R739 Hyperglycemia, unspecified: Secondary | ICD-10-CM

## 2017-08-04 NOTE — Telephone Encounter (Signed)
-----   Message from Eustace Pen, LPN sent at 08/31/2242 10:30 AM EDT ----- Regarding: Labs 5/7 Lab orders needed. Thank you.  Insurance:  [facility] Medicare

## 2017-08-05 ENCOUNTER — Ambulatory Visit (INDEPENDENT_AMBULATORY_CARE_PROVIDER_SITE_OTHER): Payer: Medicare Other

## 2017-08-05 VITALS — BP 102/70 | HR 65 | Temp 98.5°F | Ht 63.0 in | Wt 171.0 lb

## 2017-08-05 DIAGNOSIS — E559 Vitamin D deficiency, unspecified: Secondary | ICD-10-CM

## 2017-08-05 DIAGNOSIS — R739 Hyperglycemia, unspecified: Secondary | ICD-10-CM | POA: Diagnosis not present

## 2017-08-05 DIAGNOSIS — E7800 Pure hypercholesterolemia, unspecified: Secondary | ICD-10-CM | POA: Diagnosis not present

## 2017-08-05 DIAGNOSIS — Z0000 Encounter for general adult medical examination without abnormal findings: Secondary | ICD-10-CM | POA: Diagnosis not present

## 2017-08-05 DIAGNOSIS — I10 Essential (primary) hypertension: Secondary | ICD-10-CM | POA: Diagnosis not present

## 2017-08-05 NOTE — Progress Notes (Signed)
Subjective:   Lindsey [name] is a 62 y.o. female who presents for Medicare Annual (Subsequent) preventive examination.  Review of Systems:  N/A Cardiac Risk Factors include: obesity (BMI >30kg/m2);dyslipidemia;hypertension     Objective:     Vitals: BP 102/70 (BP Location: Right Arm, Patient Position: Sitting, Cuff Size: Normal)   Pulse 65   Temp 98.5 F (36.9 C) (Oral)   Ht 5\' 3"  (1.6 m) Comment: no shoes  Wt 171 lb (77.6 kg)   SpO2 97%   BMI 30.29 kg/m   Body mass index is 30.29 kg/m.  Advanced Directives 08/05/2017 04/23/2016 09/20/2015  Does Patient Have a Medical Advance Directive? No No No  Would patient like information on creating a medical advance directive? No - Patient declined - -    Tobacco Social History   Tobacco Use  Smoking Status Never Smoker  Smokeless Tobacco Never Used     Counseling given: No   Clinical Intake:  Pre-visit preparation completed: Yes  Pain : No/denies pain Pain Score: 4      Nutritional Status: BMI > 30  Obese Nutritional Risks: None Diabetes: No  How often do you need to have someone help you when you read instructions, pamphlets, or other written materials from your doctor or pharmacy?: 1 - Never What is the last grade level you completed in school?: Bachelors degree  Interpreter Needed?: No  Comments: pt lives with spouse Information entered by :: LPinson, LPN  Past Medical History:  Diagnosis Date  . Arthritis   . Cataract   . Chronic fatigue   . Early onset macular degeneration   . Fibromyalgia   . Hemorrhoids   . Hyperlipidemia   . Hypersomnia   . Hypertension   . IBS (irritable bowel syndrome)   . Interstitial cystitis   . Migraines   . Osteopenia   . Pelvic floor dysfunction   . PFO (patent foramen ovale)   . Tendonitis    hand  . Vitamin D deficiency    Past Surgical History:  Procedure Laterality Date  . ANKLE ARTHROSCOPY Right   . ANKLE FRACTURE SURGERY Left   . BLADDER SURGERY     and  hydrodistension  . BREAST EXCISIONAL BIOPSY Bilateral    Multiple Benign Lumps removed    . BREAST LUMPECTOMY     many, bilateral  . CERVICAL DISCECTOMY  2006   fusion and plating  . CHOLECYSTECTOMY N/A 09/26/2015   Procedure: LAPAROSCOPIC CHOLECYSTECTOMY WITH INTRAOPERATIVE CHOLANGIOGRAM;  Surgeon: Johnathan Hausen, MD;  Location: [facility] ORS;  Service: General;  Laterality: N/A;  . COLONOSCOPY    . FRACTURE SURGERY     ankle fracture x 2  . NASAL SINUS SURGERY     multiple, x4  . UPPER GASTROINTESTINAL ENDOSCOPY    . VAGINAL HYSTERECTOMY  1990   because of the IC partial   Family History  Problem Relation Age of Onset  . Diabetes Father   . Alcohol abuse Father   . Heart disease Father   . Fibromyalgia Mother   . Colon cancer Paternal Uncle   . Stomach cancer Paternal Grandfather   . Diabetes Paternal Grandfather   . Diabetes Paternal Grandmother   . Stroke Paternal Grandmother   . Breast cancer Neg Hx    Social History   Socioeconomic History  . Marital status: Married    Spouse name: Not on file  . Number of children: 0  . Years of education: Not on file  . Highest education level:  Not on file  Occupational History  . Occupation: Disabled from [place]: [employer]  . Financial resource strain: Not on file  . Food insecurity:    Worry: Not on file    Inability: Not on file  . Transportation needs:    Medical: Not on file    Non-medical: Not on file  Tobacco Use  . Smoking status: Never Smoker  . Smokeless tobacco: Never Used  Substance and Sexual Activity  . Alcohol use: No    Alcohol/week: 0.0 oz  . Drug use: No  . Sexual activity: Yes  Lifestyle  . Physical activity:    Days per week: Not on file    Minutes per session: Not on file  . Stress: Not on file  Relationships  . Social connections:    Talks on phone: Not on file    Gets together: Not on file    Attends religious service: Not on file    Active member of club or  organization: Not on file    Attends meetings of clubs or organizations: Not on file    Relationship status: Not on file  Other Topics Concern  . Not on file  Social History Narrative  . Not on file    Outpatient Encounter Medications as of 08/05/2017  Medication Sig  . atenolol (TENORMIN) 100 MG tablet Take 1 tablet (100 mg total) by mouth daily.  . botulinum toxin Type A (BOTOX) 100 units SOLR injection Inject 200 Units into the muscle once.   . cholecalciferol (VITAMIN D) 1000 UNITS tablet Take 2,000 Units by mouth 2 (two) times daily.   . diclofenac sodium (VOLTAREN) 1 % GEL Apply 3 g topically 3 (three) times daily as needed.  . DULoxetine (CYMBALTA) 60 MG capsule TAKE 1 CAPSULE (60 MG TOTAL) BY MOUTH DAILY.  . fentaNYL (DURAGESIC - DOSED MCG/HR) 50 MCG/HR Place 50 mcg onto the skin every other day.   . hydrOXYzine (ATARAX/VISTARIL) 25 MG tablet   . lidocaine (LIDODERM) 5 % Place onto the skin.  Marland Kitchen nitrofurantoin (MACRODANTIN) 50 MG capsule Take 50 mg by mouth daily.  . pentosan polysulfate (ELMIRON) 100 MG capsule Take 200 mg by mouth 2 (two) times daily.   Marland Kitchen tiZANidine (ZANAFLEX) 4 MG tablet 1 tablet p.o. nightly as needed (Patient not taking: Reported on 08/05/2017)   No facility-administered encounter medications on file as of 08/05/2017.     Activities of Daily Living In your present state of health, do you have any difficulty performing the following activities: 08/05/2017  Hearing? N  Vision? Y  Comment macular degeneration bilateral eyes  Difficulty concentrating or making decisions? N  Walking or climbing stairs? Y  Dressing or bathing? N  Doing errands, shopping? Y  Comment drives short distances only  Conservation officer, nature and eating ? N  Using the Toilet? N  In the past six months, have you accidently leaked urine? N  Do you have problems with loss of bowel control? N  Managing your Medications? N  Managing your Finances? N  Housekeeping or managing your Housekeeping? Y    Comment external housekeeping assistance   Some recent data might be hidden    Patient Care Team: Tower, Wynelle Fanny, MD as PCP - General    Assessment:   This is a routine wellness examination for Lindsey [name].   Hearing Screening   125Hz  250Hz  500Hz  1000Hz  2000Hz  3000Hz  4000Hz  6000Hz  8000Hz   Right ear:   40 40 40  40  Left ear:   40 40 40  40    Vision Screening Comments: Last vision exam in April 2019 with Dr. Annamaria Helling    Exercise Activities and Dietary recommendations Current Exercise Habits: The patient does not participate in regular exercise at present, Exercise limited by: None identified  Goals    . Patient Stated     Starting 08/05/2017, I will continue to take medications as prescribed.        Fall Risk Fall Risk  08/05/2017 04/23/2016 10/31/2014 12/24/2013 09/06/2012  Falls in the past year? Yes Yes Yes Yes No  Comment averages 1 fall every 2 mths; lacerations and bruising only  pt reports 4-5 falls without injury; pt states she is having clumsiness - - -  Number falls in past yr: 2 or more 2 or more 2 or more 2 or more -  Injury with Fall? Yes No No - -  Risk Factor Category  High Fall Risk - - High Fall Risk -  Risk for fall due to : Impaired balance/gait - - - -   Depression Screen PHQ 2/9 Scores 08/05/2017 06/17/2017 04/23/2016 10/31/2014  PHQ - 2 Score 1 3 0 0  PHQ- 9 Score 12 15 - -     Cognitive Function MMSE - Mini Mental State Exam 08/05/2017 04/23/2016  Orientation to time 5 5  Orientation to Place 5 5  Registration 3 3  Attention/ Calculation 0 0  Recall 3 3  Language- name 2 objects 0 0  Language- repeat 1 1  Language- follow 3 step command 3 3  Language- read & follow direction 0 0  Write a sentence 0 0  Copy design 0 0  Total score 20 20     PLEASE NOTE: A Mini-Cog screen was completed. Maximum score is 20. A value of 0 denotes this part of Folstein MMSE was not completed or the patient failed this part of the Mini-Cog screening.   Mini-Cog  Screening Orientation to Time - Max 5 pts Orientation to Place - Max 5 pts Registration - Max 3 pts Recall - Max 3 pts Language Repeat - Max 1 pts Language Follow 3 Step Command - Max 3 pts     Immunization History  Administered Date(s) Administered  . Influenza Whole 12/31/2006  . Influenza,inj,Quad PF,6+ Mos 12/24/2013, 02/20/2016, 10/30/2016  . Pneumococcal Polysaccharide-23 09/04/2012  . Td 04/23/2007  . Tdap 06/17/2017    Screening Tests Health Maintenance  Topic Date Due  . INFLUENZA VACCINE  10/30/2017  . MAMMOGRAM  02/26/2019  . COLONOSCOPY  08/08/2020  . TETANUS/TDAP  06/18/2027  . Hepatitis C Screening  Completed  . HIV Screening  Completed     Plan:     I have personally reviewed, addressed, and noted the following in the patient's chart:  A. Medical and social history B. Use of alcohol, tobacco or illicit drugs  C. Current medications and supplements D. Functional ability and status E.  Nutritional status F.  Physical activity G. Advance directives H. List of other physicians I.  Hospitalizations, surgeries, and ER visits in previous 12 months J.  [place] to include hearing, vision, cognitive, depression L. Referrals and appointments - none  In addition, I have reviewed and discussed with patient certain preventive protocols, quality metrics, and best practice recommendations. A written personalized care plan for preventive services as well as general preventive health recommendations were provided to patient.  See attached scanned questionnaire for additional information.   Signed,   Katha Cabal  Chung Chagoya, MHA, BS, LPN Health Coach

## 2017-08-05 NOTE — Patient Instructions (Signed)
Lindsey [name] , Thank you for taking time to come for your Medicare Wellness Visit. I appreciate your ongoing commitment to your health goals. Please review the following plan we discussed and let me know if I can assist you in the future.   These are the goals we discussed: Goals    . Patient Stated     Starting 08/05/2017, I will continue to take medications as prescribed.        This is a list of the screening recommended for you and due dates:  Health Maintenance  Topic Date Due  . Flu Shot  10/30/2017  . Mammogram  02/26/2019  . Colon Cancer Screening  08/08/2020  . Tetanus Vaccine  06/18/2027  .  Hepatitis C: One time screening is recommended by Center for Disease Control  (CDC) for  adults born from [birth year] through 1964.   Completed  . HIV Screening  Completed   Preventive Care for Adults  A healthy lifestyle and preventive care can promote health and wellness. Preventive health guidelines for adults include the following key practices.  . A routine yearly physical is a good way to check with your health care provider about your health and preventive screening. It is a chance to share any concerns and updates on your health and to receive a thorough exam.  . Visit your dentist for a routine exam and preventive care every 6 months. Brush your teeth twice a day and floss once a day. Good oral hygiene prevents tooth decay and gum disease.  . The frequency of eye exams is based on your age, health, family medical history, use  of contact lenses, and other factors. Follow your health care provider's recommendations for frequency of eye exams.  . Eat a healthy diet. Foods like vegetables, fruits, whole grains, low-fat dairy products, and lean protein foods contain the nutrients you need without too many calories. Decrease your intake of foods high in solid fats, added sugars, and salt. Eat the right amount of calories for you. Get information about a proper diet from your health care  provider, if necessary.  . Regular physical exercise is one of the most important things you can do for your health. Most adults should get at least 150 minutes of moderate-intensity exercise (any activity that increases your heart rate and causes you to sweat) each week. In addition, most adults need muscle-strengthening exercises on 2 or more days a week.  Silver Sneakers may be a benefit available to you. To determine eligibility, you may visit the website: www.silversneakers.com or contact program at 743-772-0211 Mon-Fri between 8AM-8PM.   . Maintain a healthy weight. The body mass index (BMI) is a screening tool to identify possible weight problems. It provides an estimate of body fat based on height and weight. Your health care provider can find your BMI and can help you achieve or maintain a healthy weight.   For adults 20 years and older: ? A BMI below 18.5 is considered underweight. ? A BMI of 18.5 to 24.9 is normal. ? A BMI of 25 to 29.9 is considered overweight. ? A BMI of 30 and above is considered obese.   . Maintain normal blood lipids and cholesterol levels by exercising and minimizing your intake of saturated fat. Eat a balanced diet with plenty of fruit and vegetables. Blood tests for lipids and cholesterol should begin at age 72 and be repeated every 5 years. If your lipid or cholesterol levels are high, you are over 50, or you  are at high risk for heart disease, you may need your cholesterol levels checked more frequently. Ongoing high lipid and cholesterol levels should be treated with medicines if diet and exercise are not working.  . If you smoke, find out from your health care provider how to quit. If you do not use tobacco, please do not start.  . If you choose to drink alcohol, please do not consume more than 2 drinks per day. One drink is considered to be 12 ounces (355 mL) of beer, 5 ounces (148 mL) of wine, or 1.5 ounces (44 mL) of liquor.  . If you are 98-79 years  old, ask your health care provider if you should take aspirin to prevent strokes.  . Use sunscreen. Apply sunscreen liberally and repeatedly throughout the day. You should seek shade when your shadow is shorter than you. Protect yourself by wearing long sleeves, pants, a wide-brimmed hat, and sunglasses year round, whenever you are outdoors.  . Once a month, do a whole body skin exam, using a mirror to look at the skin on your back. Tell your health care provider of new moles, moles that have irregular borders, moles that are larger than a pencil eraser, or moles that have changed in shape or color.

## 2017-08-05 NOTE — Progress Notes (Signed)
PCP notes:   Health maintenance:  No gaps identified.  Abnormal screenings:   Fall risk - hx of multiple falls Fall Risk  08/05/2017 04/23/2016 10/31/2014 12/24/2013 09/06/2012  Falls in the past year? Yes Yes Yes Yes No  Comment averages 1 fall every 2 mths; lacerations and bruising only  pt reports 4-5 falls without injury; pt states she is having clumsiness - - -  Number falls in past yr: 2 or more 2 or more 2 or more 2 or more -  Injury with Fall? Yes No No - -  Risk Factor Category  High Fall Risk - - High Fall Risk -  Risk for fall due to : Impaired balance/gait - - - -   Depression score: 12 Depression screen [facility] 2/9 08/05/2017 06/17/2017 04/23/2016 10/31/2014 12/24/2013  Decreased Interest 1 3 0 0 0  Down, Depressed, Hopeless 0 0 0 0 0  PHQ - 2 Score 1 3 0 0 0  Altered sleeping 3 3 - - -  Tired, decreased energy 3 3 - - -  Change in appetite 0 0 - - -  Feeling bad or failure about yourself  1 1 - - -  Trouble concentrating 3 3 - - -  Moving slowly or fidgety/restless 1 2 - - -  Suicidal thoughts 0 0 - - -  PHQ-9 Score 12 15 - - -   Patient concerns:   None  Nurse concerns:  None  Next PCP appt:   08/11/17 @ 1430  I reviewed health advisor's note, was available for consultation, and agree with documentation and plan. [name] MD

## 2017-08-11 ENCOUNTER — Ambulatory Visit (INDEPENDENT_AMBULATORY_CARE_PROVIDER_SITE_OTHER): Payer: Medicare Other | Admitting: Family Medicine

## 2017-08-11 ENCOUNTER — Encounter: Payer: Self-pay | Admitting: Family Medicine

## 2017-08-11 VITALS — BP 122/70 | HR 69 | Temp 98.2°F | Ht 63.0 in | Wt 171.5 lb

## 2017-08-11 DIAGNOSIS — Z0000 Encounter for general adult medical examination without abnormal findings: Secondary | ICD-10-CM | POA: Diagnosis not present

## 2017-08-11 DIAGNOSIS — M8589 Other specified disorders of bone density and structure, multiple sites: Secondary | ICD-10-CM | POA: Diagnosis not present

## 2017-08-11 DIAGNOSIS — I10 Essential (primary) hypertension: Secondary | ICD-10-CM | POA: Diagnosis not present

## 2017-08-11 DIAGNOSIS — E7800 Pure hypercholesterolemia, unspecified: Secondary | ICD-10-CM | POA: Diagnosis not present

## 2017-08-11 DIAGNOSIS — E559 Vitamin D deficiency, unspecified: Secondary | ICD-10-CM

## 2017-08-11 DIAGNOSIS — R739 Hyperglycemia, unspecified: Secondary | ICD-10-CM

## 2017-08-11 NOTE — Assessment & Plan Note (Signed)
Reviewed health habits including diet and exercise and skin cancer prevention Reviewed appropriate screening tests for age  Also reviewed health mt list, fam hx and immunization status , as well as social and family history   See HPI Labs reviewed  amw reviewed  Disc shingrix vaccine Disc need for dexa Disc fall prev  Diet /exercise to prevent DM

## 2017-08-11 NOTE — Assessment & Plan Note (Signed)
Lab Results  Component Value Date   HGBA1C 6.3 06/17/2017   disc imp of low glycemic diet and wt loss to prevent DM2

## 2017-08-11 NOTE — Assessment & Plan Note (Signed)
Disc goals for lipids and reasons to control them Rev last labs with pt Rev low sat fat diet in detail LDL is up again into the 130s   Pt will make attempt to cut out beef - which she eats a lot

## 2017-08-11 NOTE — Assessment & Plan Note (Signed)
Dexa 2015  Remote ankle fx  No recent fx though has high risk of falls (disc fall prev in and out of the home  Disc imp of ca and D  Exercise as tolerated  Pt will disc another dexa with Dr Garen Grams at next visit

## 2017-08-11 NOTE — Progress Notes (Signed)
Subjective:    Patient ID: Lindsey [name], female    DOB: [birth year], 62 y.o.   MRN: [id number]  HPI Here for health maintenance exam and to review chronic medical problems  Feeling about the same  The weather change is hard   Also allergies - tree pollen     Mammogram 11/18-no problems /neg Self breast exam -no lumps   Colonoscopy 5/12 normal with 10 y recall Sees Dr Carlean Purl Has IBS  Zoster status -is on the wait list for shingrix   Partial hysterectomy in the past  No gyn problems   Had amw on 5/7  Clumsy - falls often (due to pain) - goal is to stay away from steps  Depression screening score of 12 (better than last year)    Wt Readings from Last 3 Encounters:  08/11/17 171 lb 8 oz (77.8 kg)  08/05/17 171 lb (77.6 kg)  07/01/17 174 lb (78.9 kg)  exercise is limited by pain - she tries to do exercise while sitting for rom of joints  Does not like water  Never could get back with yoga/pilates  Diet is not good  30.38 kg/m   dexa 2015 osteopenia  Vit D -takes 4000 iu daily  No new fractures   (ankles in the past)  Dr Garen Grams orders it   bp is stable today  No cp or palpitations or headaches or edema  No side effects to medicines  BP Readings from Last 3 Encounters:  08/11/17 122/70  08/05/17 102/70  07/01/17 130/84     Lab Results  Component Value Date   CREATININE 0.70 06/17/2017   BUN 7 06/17/2017   NA 136 06/17/2017   K 4.9 06/17/2017   CL 100 06/17/2017   CO2 31 06/17/2017   Lab Results  Component Value Date   ALT 28 06/17/2017   AST 22 06/17/2017   ALKPHOS 66 06/17/2017   BILITOT 0.4 06/17/2017   Lab Results  Component Value Date   WBC 6.1 06/17/2017   HGB 14.0 06/17/2017   HCT 42.3 06/17/2017   MCV 87.8 06/17/2017   PLT 213.0 06/17/2017   Lab Results  Component Value Date   TSH 2.05 06/17/2017      Continues to see Dr Garen Grams for myofascial pain syndrome with depression  Urology for IC   Hyperglycemia Lab Results    Component Value Date   HGBA1C 6.3 06/17/2017  up from 6.2 Eats whatever she wants  Not large amounts  Also limited by IC diet   Hyperlipidemia Lab Results  Component Value Date   CHOL 209 (H) 06/17/2017   CHOL 185 04/09/2016   CHOL 204 (H) 10/24/2014   Lab Results  Component Value Date   HDL 61.20 06/17/2017   HDL 54.70 04/09/2016   HDL 52.90 10/24/2014   Lab Results  Component Value Date   LDLCALC 131 (H) 06/17/2017   LDLCALC 119 (H) 04/09/2016   LDLCALC 138 (H) 10/24/2014   Lab Results  Component Value Date   TRIG 87.0 06/17/2017   TRIG 61.0 04/09/2016   TRIG 66.0 10/24/2014   Lab Results  Component Value Date   CHOLHDL 3 06/17/2017   CHOLHDL 3 04/09/2016   CHOLHDL 4 10/24/2014   Lab Results  Component Value Date   LDLDIRECT 137.4 11/27/2012   LDLDIRECT 166.1 07/24/2012   LDLDIRECT 161.5 07/03/2011  eats a lot of beef    Patient Active Problem List   Diagnosis Date Noted  . Autoimmune disease ([place]) 06/18/2017  .  Rash and nonspecific skin eruption 09/04/2016  . History of recurrent cystitis (IC) 08/16/2016  . Pelvic floor dysfunction 08/16/2016  . Dysuria 05/24/2016  . Macular degeneration 04/09/2016  . Osteoarthritis, knee 02/15/2016  . S/P laparoscopic cholecystectomy June 2017 09/26/2015  . Dermatitis 10/31/2014  . Hyperglycemia 12/24/2013  . Encounter for Medicare annual wellness exam 09/04/2012  . HSV (herpes simplex virus) infection 04/08/2012  . IBS (irritable bowel syndrome) 10/21/2011  . Routine general medical examination at a health care facility 07/02/2011  . Osteopenia 05/17/2009  . Depressed mood 10/05/2008  . GERD 05/05/2008  . OBSTRUCTIVE SLEEP APNEA 06/25/2007  . Hyperlipidemia 05/31/2007  . Essential hypertension 05/31/2007  . Other fatigue 04/23/2007  . Vitamin D deficiency 01/22/2007  . Migraine headache 01/22/2007  . Interstitial cystitis 01/22/2007  . MENOPAUSE-RELATED VASOMOTOR SYMPTOMS 01/22/2007  . OSTEOARTHROSIS,  GENERALIZED, MULTIPLE SITES 01/22/2007  . Fibromyalgia 01/22/2007  . PATENT FORAMEN OVALE 01/22/2007   Past Medical History:  Diagnosis Date  . Arthritis   . Cataract   . Chronic fatigue   . Early onset macular degeneration   . Fibromyalgia   . Hemorrhoids   . Hyperlipidemia   . Hypersomnia   . Hypertension   . IBS (irritable bowel syndrome)   . Interstitial cystitis   . Migraines   . Osteopenia   . Pelvic floor dysfunction   . PFO (patent foramen ovale)   . Tendonitis    hand  . Vitamin D deficiency    Past Surgical History:  Procedure Laterality Date  . ANKLE ARTHROSCOPY Right   . ANKLE FRACTURE SURGERY Left   . BLADDER SURGERY     and hydrodistension  . BREAST EXCISIONAL BIOPSY Bilateral    Multiple Benign Lumps removed    . BREAST LUMPECTOMY     many, bilateral  . CERVICAL DISCECTOMY  2006   fusion and plating  . CHOLECYSTECTOMY N/A 09/26/2015   Procedure: LAPAROSCOPIC CHOLECYSTECTOMY WITH INTRAOPERATIVE CHOLANGIOGRAM;  Surgeon: Johnathan Hausen, MD;  Location: [facility] ORS;  Service: General;  Laterality: N/A;  . COLONOSCOPY    . FRACTURE SURGERY     ankle fracture x 2  . NASAL SINUS SURGERY     multiple, x4  . UPPER GASTROINTESTINAL ENDOSCOPY    . VAGINAL HYSTERECTOMY  1990   because of the IC partial   Social History   Tobacco Use  . Smoking status: Never Smoker  . Smokeless tobacco: Never Used  Substance Use Topics  . Alcohol use: No    Alcohol/week: 0.0 oz  . Drug use: No   Family History  Problem Relation Age of Onset  . Diabetes Father   . Alcohol abuse Father   . Heart disease Father   . Fibromyalgia Mother   . Colon cancer Paternal Uncle   . Stomach cancer Paternal Grandfather   . Diabetes Paternal Grandfather   . Diabetes Paternal Grandmother   . Stroke Paternal Grandmother   . Breast cancer Neg Hx    Allergies  Allergen Reactions  . Buspirone Hcl     REACTION: dystonia  . Simvastatin     REACTION: malaise  . Sulfa Antibiotics      nausea   Current Outpatient Medications on File Prior to Visit  Medication Sig Dispense Refill  . atenolol (TENORMIN) 100 MG tablet Take 1 tablet (100 mg total) by mouth daily. 90 tablet 3  . botulinum toxin Type A (BOTOX) 100 units SOLR injection Inject 200 Units into the muscle once.     Marland Kitchen  calcium carbonate (TUMS - DOSED IN MG ELEMENTAL CALCIUM) 500 MG chewable tablet Chew 1 tablet by mouth daily as needed for indigestion or heartburn.    . cholecalciferol (VITAMIN D) 1000 UNITS tablet Take 2,000 Units by mouth 2 (two) times daily.     . diclofenac sodium (VOLTAREN) 1 % GEL Apply 3 g topically 3 (three) times daily as needed.  3  . DULoxetine (CYMBALTA) 60 MG capsule TAKE 1 CAPSULE (60 MG TOTAL) BY MOUTH DAILY. 90 capsule 1  . famotidine (PEPCID) 20 MG tablet Take 20 mg by mouth 2 (two) times daily as needed for heartburn or indigestion.    . fentaNYL (DURAGESIC - DOSED MCG/HR) 50 MCG/HR Place 50 mcg onto the skin every other day.     . hydrOXYzine (ATARAX/VISTARIL) 25 MG tablet     . lidocaine (LIDODERM) 5 % Place onto the skin.    Marland Kitchen LUTEIN PO Take 1 tablet by mouth daily.    . nitrofurantoin (MACRODANTIN) 50 MG capsule Take 50 mg by mouth daily.    . pentosan polysulfate (ELMIRON) 100 MG capsule Take 200 mg by mouth 2 (two) times daily.      No current facility-administered medications on file prior to visit.     Review of Systems  Constitutional: Positive for fatigue. Negative for activity change, appetite change, fever and unexpected weight change.  HENT: Negative for congestion, ear pain, rhinorrhea, sinus pressure and sore throat.   Eyes: Positive for visual disturbance. Negative for pain and redness.  Respiratory: Negative for cough, shortness of breath and wheezing.   Cardiovascular: Negative for chest pain and palpitations.  Gastrointestinal: Negative for abdominal pain, blood in stool, constipation and diarrhea.  Endocrine: Negative for polydipsia and polyuria.  Genitourinary:  Positive for dysuria and frequency. Negative for urgency.  Musculoskeletal: Positive for arthralgias, back pain and myalgias.  Skin: Negative for pallor and rash.  Allergic/Immunologic: Negative for environmental allergies.  Neurological: Positive for weakness. Negative for dizziness, syncope and headaches.  Hematological: Negative for adenopathy. Does not bruise/bleed easily.  Psychiatric/Behavioral: Negative for decreased concentration and dysphoric mood. The patient is not nervous/anxious.        Objective:   Physical Exam  Constitutional: She appears well-developed and well-nourished. No distress.  obese and well appearing   HENT:  Head: Normocephalic and atraumatic.  Right Ear: External ear normal.  Left Ear: External ear normal.  Mouth/Throat: Oropharynx is clear and moist.  Eyes: Pupils are equal, round, and reactive to light. Conjunctivae and EOM are normal. No scleral icterus.  Neck: Normal range of motion. Neck supple. No JVD present. Carotid bruit is not present. No thyromegaly present.  Cardiovascular: Normal rate, regular rhythm, normal heart sounds and intact distal pulses. Exam reveals no gallop.  Pulmonary/Chest: Effort normal and breath sounds normal. No respiratory distress. She has no wheezes. She exhibits no tenderness. No breast tenderness, discharge or bleeding.  Abdominal: Soft. Bowel sounds are normal. She exhibits no distension, no abdominal bruit and no mass. There is no tenderness.  Genitourinary: No breast tenderness, discharge or bleeding.  Genitourinary Comments: Breast exam: No mass, nodules, thickening, tenderness, bulging, retraction, inflamation, nipple discharge or skin changes noted.  No axillary or clavicular LA.      Musculoskeletal: Normal range of motion. She exhibits no edema or tenderness.  Lymphadenopathy:    She has no cervical adenopathy.  Neurological: She is alert. She has normal reflexes. No cranial nerve deficit. She exhibits normal muscle  tone. Coordination normal.  Gait is  labored due to chronic pain   Skin: Skin is warm and dry. No rash noted. No erythema. No pallor.  Fair complexion Some lentigines   Psychiatric: She has a normal mood and affect.  Pleasant  Good mood today          Assessment & Plan:   Problem List Items Addressed This Visit      Cardiovascular and Mediastinum   Essential hypertension    bp in fair control at this time  BP Readings from Last 1 Encounters:  08/11/17 122/70   No changes needed Most recent labs reviewed  Disc lifstyle change with low sodium diet and exercise   Labs rev         Musculoskeletal and Integument   Osteopenia    Dexa 2015  Remote ankle fx  No recent fx though has high risk of falls (disc fall prev in and out of the home  Disc imp of ca and D  Exercise as tolerated  Pt will disc another dexa with Dr Garen Grams at next visit         Other   Hyperglycemia    Lab Results  Component Value Date   HGBA1C 6.3 06/17/2017   disc imp of low glycemic diet and wt loss to prevent DM2        Hyperlipidemia    Disc goals for lipids and reasons to control them Rev last labs with pt Rev low sat fat diet in detail LDL is up again into the 130s   Pt will make attempt to cut out beef - which she eats a lot       Routine general medical examination at a health care facility - Primary    Reviewed health habits including diet and exercise and skin cancer prevention Reviewed appropriate screening tests for age  Also reviewed health mt list, fam hx and immunization status , as well as social and family history   See HPI Labs reviewed  amw reviewed  Disc shingrix vaccine Disc need for dexa Disc fall prev  Diet /exercise to prevent DM      Vitamin D deficiency    Continue vit D 4000 iu daily for bone and overall health

## 2017-08-11 NOTE — Assessment & Plan Note (Signed)
bp in fair control at this time  BP Readings from Last 1 Encounters:  08/11/17 122/70   No changes needed Most recent labs reviewed  Disc lifstyle change with low sodium diet and exercise   Labs rev

## 2017-08-11 NOTE — Assessment & Plan Note (Signed)
Continue vit D 4000 iu daily for bone and overall health

## 2017-08-11 NOTE — Patient Instructions (Addendum)
If you are interested in the new shingles vaccine (Shingrix) - call your local pharmacy to check on coverage and availability  Stay on the wait list for that    Be careful not to fall - use walker or cane if pain causes you to fall   Talk to Dr Garen Grams about when to do the next bone density test   To prevent losing muscle mass- try low impact exercise and stretching  Light yoga  Short distance walking (for example 5 minutes three or more times per day)   To prevent diabetes  Try to get most of your carbohydrates from produce (with the exception of white potatoes)  Eat less bread/pasta/rice/snack foods/cereals/sweets and other items from the middle of the grocery store (processed carbs)  Weight loss helps too   For cholesterol   Avoid red meat/ fried foods/ egg yolks/ fatty breakfast meats/ butter, cheese and high fat dairy/ and shellfish

## 2017-09-16 DIAGNOSIS — N3010 Interstitial cystitis (chronic) without hematuria: Secondary | ICD-10-CM | POA: Diagnosis not present

## 2017-09-16 DIAGNOSIS — M797 Fibromyalgia: Secondary | ICD-10-CM | POA: Diagnosis not present

## 2017-09-16 DIAGNOSIS — R5382 Chronic fatigue, unspecified: Secondary | ICD-10-CM | POA: Diagnosis not present

## 2017-09-16 DIAGNOSIS — K582 Mixed irritable bowel syndrome: Secondary | ICD-10-CM | POA: Diagnosis not present

## 2017-09-27 ENCOUNTER — Other Ambulatory Visit: Payer: Self-pay | Admitting: Rheumatology

## 2017-09-29 NOTE — Telephone Encounter (Signed)
Last Visit: 07/01/17 Next Visit: 01/01/18  Okay to refill per Dr. Estanislado Pandy

## 2017-10-14 DIAGNOSIS — M542 Cervicalgia: Secondary | ICD-10-CM | POA: Diagnosis not present

## 2017-10-14 DIAGNOSIS — G518 Other disorders of facial nerve: Secondary | ICD-10-CM | POA: Diagnosis not present

## 2017-10-14 DIAGNOSIS — M791 Myalgia, unspecified site: Secondary | ICD-10-CM | POA: Diagnosis not present

## 2017-10-14 DIAGNOSIS — G43719 Chronic migraine without aura, intractable, without status migrainosus: Secondary | ICD-10-CM | POA: Diagnosis not present

## 2017-12-16 DIAGNOSIS — N3010 Interstitial cystitis (chronic) without hematuria: Secondary | ICD-10-CM | POA: Diagnosis not present

## 2017-12-16 DIAGNOSIS — B9620 Unspecified Escherichia coli [E. coli] as the cause of diseases classified elsewhere: Secondary | ICD-10-CM | POA: Diagnosis not present

## 2017-12-16 DIAGNOSIS — R82998 Other abnormal findings in urine: Secondary | ICD-10-CM | POA: Diagnosis not present

## 2017-12-16 DIAGNOSIS — K582 Mixed irritable bowel syndrome: Secondary | ICD-10-CM | POA: Diagnosis not present

## 2017-12-16 DIAGNOSIS — R5382 Chronic fatigue, unspecified: Secondary | ICD-10-CM | POA: Diagnosis not present

## 2017-12-16 DIAGNOSIS — Z09 Encounter for follow-up examination after completed treatment for conditions other than malignant neoplasm: Secondary | ICD-10-CM | POA: Diagnosis not present

## 2017-12-17 DIAGNOSIS — H47323 Drusen of optic disc, bilateral: Secondary | ICD-10-CM | POA: Diagnosis not present

## 2017-12-19 NOTE — Progress Notes (Signed)
Office Visit Note  Patient: Lindsey [name]             Date of Birth: [birth year]           MRN: [id number]             PCP: Abner Greenspan, MD Referring: Tower, Wynelle Fanny, MD Visit Date: 01/01/2018 Occupation: @GUAROCC @  Subjective:  Generalized pain   History of Present Illness: Lindsey [name] is a 62 y.o. female with history of fibromyalgia and osteoarthritis. She is taking Cymbalta 60 mg by mouth daily. She uses voltaren gel PRN. She reports she has been having more frequent fibromyalgia flares.  She has generalized muscle aches and muscle tenderness due to fibromyalgia.  She states that she is currently in a flare.  She states that her fatigue has been worsening recently.  She continues very interrupted sleep at night.  She states that she wakes up several times at night due to pain as well as interstitial cystitis.  She continues to see Dr. Amalia Hailey every 3 months.  She states that she is having increased pain in bilateral hands and bilateral wrists.  She denies any joint swelling.  She states she has discomfort in her right knee joint especially going up and down steps but denies any joint swelling.  She continues to have chronic neck pain but denies any symptoms of radiculopathy at this time.   Activities of Daily Living:  Patient reports morning stiffness for 2 hour.   Patient Reports nocturnal pain.  Difficulty dressing/grooming: Denies Difficulty climbing stairs: Reports Difficulty getting out of chair: Reports Difficulty using hands for taps, buttons, cutlery, and/or writing: Denies  Review of Systems  Constitutional: Positive for fatigue.  HENT: Positive for mouth sores and mouth dryness. Negative for nose dryness.   Eyes: Positive for dryness (Hx of MD and cataracts). Negative for pain and visual disturbance.  Respiratory: Negative for cough, hemoptysis, shortness of breath and difficulty breathing.   Cardiovascular: Negative for chest pain, palpitations, hypertension and swelling  in legs/feet.  Gastrointestinal: Positive for constipation (Hx of IBS) and diarrhea. Negative for blood in stool.  Endocrine: Negative for increased urination.  Genitourinary: Positive for painful urination (Hx of IC).  Musculoskeletal: Positive for myalgias, morning stiffness, muscle tenderness and myalgias. Negative for arthralgias, joint pain, joint swelling and muscle weakness.  Skin: Negative for color change, pallor, rash, hair loss, nodules/bumps, skin tightness, ulcers and sensitivity to sunlight.  Allergic/Immunologic: Negative for susceptible to infections.  Neurological: Positive for headaches (Gets botox injections ). Negative for dizziness, numbness and weakness.  Hematological: Negative for swollen glands.  Psychiatric/Behavioral: Positive for sleep disturbance. Negative for depressed mood. The patient is not nervous/anxious.     PMFS History:  Patient Active Problem List   Diagnosis Date Noted  . Autoimmune disease ([place]) 06/18/2017  . Rash and nonspecific skin eruption 09/04/2016  . History of recurrent cystitis (IC) 08/16/2016  . Pelvic floor dysfunction 08/16/2016  . Dysuria 05/24/2016  . Macular degeneration 04/09/2016  . Osteoarthritis, knee 02/15/2016  . S/P laparoscopic cholecystectomy June 2017 09/26/2015  . Dermatitis 10/31/2014  . Hyperglycemia 12/24/2013  . Encounter for Medicare annual wellness exam 09/04/2012  . HSV (herpes simplex virus) infection 04/08/2012  . IBS (irritable bowel syndrome) 10/21/2011  . Routine general medical examination at a health care facility 07/02/2011  . Osteopenia 05/17/2009  . Depressed mood 10/05/2008  . GERD 05/05/2008  . OBSTRUCTIVE SLEEP APNEA 06/25/2007  . Hyperlipidemia 05/31/2007  . Essential hypertension  05/31/2007  . Other fatigue 04/23/2007  . Vitamin D deficiency 01/22/2007  . Migraine headache 01/22/2007  . Interstitial cystitis 01/22/2007  . MENOPAUSE-RELATED VASOMOTOR SYMPTOMS 01/22/2007  . OSTEOARTHROSIS,  GENERALIZED, MULTIPLE SITES 01/22/2007  . Fibromyalgia 01/22/2007  . PATENT FORAMEN OVALE 01/22/2007    Past Medical History:  Diagnosis Date  . Arthritis   . Cataract   . Chronic fatigue   . Early onset macular degeneration   . Fibromyalgia   . Hemorrhoids   . Hyperlipidemia   . Hypersomnia   . Hypertension   . IBS (irritable bowel syndrome)   . Interstitial cystitis   . Migraines   . Osteopenia   . Pelvic floor dysfunction   . PFO (patent foramen ovale)   . Tendonitis    hand  . Vitamin D deficiency     Family History  Problem Relation Age of Onset  . Diabetes Father   . Alcohol abuse Father   . Heart disease Father   . Fibromyalgia Mother   . Colon cancer Paternal Uncle   . Stomach cancer Paternal Grandfather   . Diabetes Paternal Grandfather   . Diabetes Paternal Grandmother   . Stroke Paternal Grandmother   . Breast cancer Neg Hx    Past Surgical History:  Procedure Laterality Date  . ANKLE ARTHROSCOPY Right   . ANKLE FRACTURE SURGERY Left   . BLADDER SURGERY     and hydrodistension  . BREAST EXCISIONAL BIOPSY Bilateral    Multiple Benign Lumps removed    . BREAST LUMPECTOMY     many, bilateral  . CERVICAL DISCECTOMY  2006   fusion and plating  . CHOLECYSTECTOMY N/A 09/26/2015   Procedure: LAPAROSCOPIC CHOLECYSTECTOMY WITH INTRAOPERATIVE CHOLANGIOGRAM;  Surgeon: Johnathan Hausen, MD;  Location: [facility] ORS;  Service: General;  Laterality: N/A;  . COLONOSCOPY    . FRACTURE SURGERY     ankle fracture x 2  . NASAL SINUS SURGERY     multiple, x4  . UPPER GASTROINTESTINAL ENDOSCOPY    . VAGINAL HYSTERECTOMY  1990   because of the IC partial   Social History   Social History Narrative  . Not on file    Objective: Vital Signs: BP 136/78 (BP Location: Left Arm, Patient Position: Sitting, Cuff Size: Normal)   Pulse (!) 57   Resp 14   Ht 5\' 3"  (1.6 m)   Wt 172 lb 6.4 oz (78.2 kg)   BMI 30.54 kg/m    Physical Exam  Constitutional: She is oriented to  person, place, and time. She appears well-developed and well-nourished.  HENT:  Head: Normocephalic and atraumatic.  Eyes: Conjunctivae and EOM are normal.  Neck: Normal range of motion.  Cardiovascular: Normal rate, regular rhythm, normal heart sounds and intact distal pulses.  Pulmonary/Chest: Effort normal and breath sounds normal.  Abdominal: Soft. Bowel sounds are normal.  Lymphadenopathy:    She has no cervical adenopathy.  Neurological: She is alert and oriented to person, place, and time.  Skin: Skin is warm and dry. Capillary refill takes less than 2 seconds.  Psychiatric: She has a normal mood and affect. Her behavior is normal.  Nursing note and vitals reviewed.    Musculoskeletal Exam: C-spine limited range of motion.  Thoracic and lumbar spine good range of motion.  Shoulder joints, elbow joints, wrist joints, MCPs, PIPs, DIPs good range of motion no synovitis.  She is complete fist formation bilaterally.  She has tenderness of bilateral wrist joints.  Hip joints good range of motion  with no discomfort.  Knee joints good range of motion with no warmth or effusion noted.  She has discomfort with right knee range of motion. No tenderness or swelling of ankle joints.   CDAI Exam: CDAI Score: Not documented Patient Global Assessment: Not documented; Provider Global Assessment: Not documented Swollen: Not documented; Tender: Not documented Joint Exam   Not documented   There is currently no information documented on the homunculus. Go to the Rheumatology activity and complete the homunculus joint exam.  Investigation: No additional findings.  Imaging: No results found.  Recent Labs: Lab Results  Component Value Date   WBC 6.1 06/17/2017   HGB 14.0 06/17/2017   PLT 213.0 06/17/2017   NA 136 06/17/2017   K 4.9 06/17/2017   CL 100 06/17/2017   CO2 31 06/17/2017   GLUCOSE 113 (H) 06/17/2017   BUN 7 06/17/2017   CREATININE 0.70 06/17/2017   BILITOT 0.4 06/17/2017    ALKPHOS 66 06/17/2017   AST 22 06/17/2017   ALT 28 06/17/2017   PROT 7.5 06/17/2017   ALBUMIN 4.0 06/17/2017   CALCIUM 10.2 06/17/2017   GFRAA 98 04/23/2007    Speciality Comments: No specialty comments available.  Procedures:  No procedures performed Allergies: Buspirone hcl; Simvastatin; and Sulfa antibiotics   Assessment / Plan:     Visit Diagnoses: Fibromyalgia: She has generalized hyperalgesia on exam.  She is currently in a fibromyalgia flare.  She has been having more frequent fibromyalgia flares.  She has generalized muscle aches and muscle tenderness.  She continues take Cymbalta 60 mg by mouth daily.  She applies Voltaren gel topically to painful joints.  She continues to have migraines and gets Botox injections on a regular basis.  She has chronic insomnia and fatigue.  She is encouraged to stay active and exercise on a regular basis.  Osteopenia of multiple sites - Her DEXA was done by her PCP.   Primary insomnia: Chronic.  She is interrupted sleep at night due to generalized muscle tenderness as well as interstitial cystitis.  Other fatigue: Chronic and related to insomnia.  She was encouraged to try to stay active and exercise on a regular basis.  Primary osteoarthritis of both hands: She is been having increased discomfort in bilateral hands.  No synovitis was noted on exam today.  She is complete fist formation bilaterally.  Joint protection and muscle strengthening were discussed.  She was given a handout of hand exercises that she can perform at home.  A refill of Voltaren gel was sent to the pharmacy today.  History of vitamin D deficiency: She takes vitamin D 2000 units by mouth daily.  Interstitial cystitis: She follows up with Dr. Amalia Hailey every 3 months.  Other medical conditions are listed as follows:  History of hyperlipidemia  History of hypertension  History of IBS  History of depression  History of hyperglycemia  History of recurrent cystitis  (IC)  S/P laparoscopic cholecystectomy June 2017   Orders: No orders of the defined types were placed in this encounter.  Meds ordered this encounter  Medications  . diclofenac sodium (VOLTAREN) 1 % GEL    Sig: Apply 3 g to 3 large joints up to 3 times daily    Dispense:  3 Tube    Refill:  3     Follow-Up Instructions: Return in about 6 months (around 07/03/2018) for Fibromyalgia, Osteoarthritis.   Ofilia Neas, PA-C   I examined and evaluated the patient with Hazel Sams PA.  Patient  continues to have some generalized arthralgias and myalgias due to underlying fibromyalgia.  She had no synovitis on my examination.  The plan of care was discussed as noted above.  Bo Merino, MD  Note - This record has been created using Editor, commissioning.  Chart creation errors have been sought, but may not always  have been located. Such creation errors do not reflect on  the standard of medical care.

## 2017-12-23 DIAGNOSIS — H353132 Nonexudative age-related macular degeneration, bilateral, intermediate dry stage: Secondary | ICD-10-CM | POA: Diagnosis not present

## 2018-01-01 ENCOUNTER — Encounter: Payer: Self-pay | Admitting: Rheumatology

## 2018-01-01 ENCOUNTER — Ambulatory Visit: Payer: Medicare Other | Admitting: Rheumatology

## 2018-01-01 VITALS — BP 136/78 | HR 57 | Resp 14 | Ht 63.0 in | Wt 172.4 lb

## 2018-01-01 DIAGNOSIS — Z8639 Personal history of other endocrine, nutritional and metabolic disease: Secondary | ICD-10-CM

## 2018-01-01 DIAGNOSIS — Z87440 Personal history of urinary (tract) infections: Secondary | ICD-10-CM

## 2018-01-01 DIAGNOSIS — M8589 Other specified disorders of bone density and structure, multiple sites: Secondary | ICD-10-CM | POA: Diagnosis not present

## 2018-01-01 DIAGNOSIS — Z8659 Personal history of other mental and behavioral disorders: Secondary | ICD-10-CM

## 2018-01-01 DIAGNOSIS — N3010 Interstitial cystitis (chronic) without hematuria: Secondary | ICD-10-CM

## 2018-01-01 DIAGNOSIS — R5383 Other fatigue: Secondary | ICD-10-CM | POA: Diagnosis not present

## 2018-01-01 DIAGNOSIS — M797 Fibromyalgia: Secondary | ICD-10-CM | POA: Diagnosis not present

## 2018-01-01 DIAGNOSIS — M19041 Primary osteoarthritis, right hand: Secondary | ICD-10-CM

## 2018-01-01 DIAGNOSIS — Z8679 Personal history of other diseases of the circulatory system: Secondary | ICD-10-CM

## 2018-01-01 DIAGNOSIS — F5101 Primary insomnia: Secondary | ICD-10-CM | POA: Diagnosis not present

## 2018-01-01 DIAGNOSIS — Z9049 Acquired absence of other specified parts of digestive tract: Secondary | ICD-10-CM

## 2018-01-01 DIAGNOSIS — M19042 Primary osteoarthritis, left hand: Secondary | ICD-10-CM

## 2018-01-01 DIAGNOSIS — Z8719 Personal history of other diseases of the digestive system: Secondary | ICD-10-CM

## 2018-01-01 MED ORDER — DICLOFENAC SODIUM 1 % TD GEL
TRANSDERMAL | 3 refills | Status: DC
Start: 2018-01-01 — End: 2019-01-05

## 2018-01-01 NOTE — Patient Instructions (Signed)

## 2018-01-14 DIAGNOSIS — M542 Cervicalgia: Secondary | ICD-10-CM | POA: Diagnosis not present

## 2018-01-14 DIAGNOSIS — M791 Myalgia, unspecified site: Secondary | ICD-10-CM | POA: Diagnosis not present

## 2018-01-14 DIAGNOSIS — G43719 Chronic migraine without aura, intractable, without status migrainosus: Secondary | ICD-10-CM | POA: Diagnosis not present

## 2018-01-14 DIAGNOSIS — G518 Other disorders of facial nerve: Secondary | ICD-10-CM | POA: Diagnosis not present

## 2018-01-27 ENCOUNTER — Telehealth: Payer: Self-pay | Admitting: Family Medicine

## 2018-01-27 DIAGNOSIS — E2839 Other primary ovarian failure: Secondary | ICD-10-CM

## 2018-01-27 NOTE — Telephone Encounter (Signed)
See below CRM Need order for bone density  Copied from [place] [phone]. Topic: Referral - Request for Referral >> Jan 27, 2018  1:02 PM Yvette Rack wrote: Has patient seen PCP for this complaint? Yes.  They discussed that she she was over due *If NO, is insurance requiring patient see PCP for this issue before PCP can refer them? Referral for which specialty: Bone density [facility] provider[facility]office[facility] [address] Reason for referral: over due for bone density

## 2018-01-28 DIAGNOSIS — E2839 Other primary ovarian failure: Secondary | ICD-10-CM | POA: Insufficient documentation

## 2018-01-28 NOTE — Telephone Encounter (Signed)
Called patient and told patients husband that the Bone Density order was placed and she can call directly to schedule.

## 2018-01-28 NOTE — Telephone Encounter (Signed)
Order done Will send to [facility] 

## 2018-01-30 ENCOUNTER — Ambulatory Visit (INDEPENDENT_AMBULATORY_CARE_PROVIDER_SITE_OTHER): Payer: Medicare Other | Admitting: Family Medicine

## 2018-01-30 ENCOUNTER — Encounter: Payer: Self-pay | Admitting: Family Medicine

## 2018-01-30 VITALS — BP 122/84 | HR 60 | Temp 98.3°F | Ht 63.0 in | Wt 172.0 lb

## 2018-01-30 DIAGNOSIS — R300 Dysuria: Secondary | ICD-10-CM

## 2018-01-30 DIAGNOSIS — N3000 Acute cystitis without hematuria: Secondary | ICD-10-CM | POA: Diagnosis not present

## 2018-01-30 LAB — POC URINALSYSI DIPSTICK (AUTOMATED)
Bilirubin, UA: NEGATIVE
Glucose, UA: NEGATIVE
Ketones, UA: NEGATIVE
NITRITE UA: POSITIVE
Protein, UA: POSITIVE — AB
SPEC GRAV UA: 1.02 (ref 1.010–1.025)
UROBILINOGEN UA: 0.2 U/dL
pH, UA: 6 (ref 5.0–8.0)

## 2018-01-30 MED ORDER — CEPHALEXIN 500 MG PO CAPS: 500.0000 mg | ORAL_CAPSULE | Freq: Two times a day (BID) | ORAL | 0 refills | Status: DC

## 2018-01-30 NOTE — Patient Instructions (Signed)
Drink lots of water  Take the keflex as directed  We will contact you when urine culture returns  Update if not starting to improve in a week or if worsening

## 2018-01-30 NOTE — Progress Notes (Signed)
Subjective:    Patient ID: Lindsey [name], female    DOB: 01/05/1956, 62 y.o.   MRN: [id number]  HPI Here with urinary symptoms  Burning to urinate  Bladder hurts more than usual  Nausea / no vomiting  No fever  Some flank pain on L (just a little)  Odor   Results for orders placed or performed in visit on 01/30/18  POCT Urinalysis Dipstick (Automated)  Result Value Ref Range   Color, UA Yellow    Clarity, UA Cloudy    Glucose, UA Negative Negative   Bilirubin, UA Negative    Ketones, UA Negative    Spec Grav, UA 1.020 1.010 - 1.025   Blood, UA 200 Ery/uL    pH, UA 6.0 5.0 - 8.0   Protein, UA Positive (A) Negative   Urobilinogen, UA 0.2 0.2 or 1.0 E.U./dL   Nitrite, UA Positive    Leukocytes, UA Large (3+) (A) Negative    Takes macrodantin 50 mg daily for prophylaxis Also elmiron for IC   Tends to get uti with intercourse  Tries to urinate before and after   Last uti was 2 mo ago - fully tx with keflex and did get better   Patient Active Problem List   Diagnosis Date Noted  . Estrogen deficiency 01/28/2018  . Autoimmune disease ([place]) 06/18/2017  . Rash and nonspecific skin eruption 09/04/2016  . History of recurrent cystitis (IC) 08/16/2016  . Pelvic floor dysfunction 08/16/2016  . Dysuria 05/24/2016  . Macular degeneration 04/09/2016  . Osteoarthritis, knee 02/15/2016  . S/P laparoscopic cholecystectomy June 2017 09/26/2015  . Dermatitis 10/31/2014  . Hyperglycemia 12/24/2013  . Encounter for Medicare annual wellness exam 09/04/2012  . UTI (urinary tract infection) 07/24/2012  . HSV (herpes simplex virus) infection 04/08/2012  . IBS (irritable bowel syndrome) 10/21/2011  . Routine general medical examination at a health care facility 07/02/2011  . Osteopenia 05/17/2009  . Depressed mood 10/05/2008  . GERD 05/05/2008  . OBSTRUCTIVE SLEEP APNEA 06/25/2007  . Hyperlipidemia 05/31/2007  . Essential hypertension 05/31/2007  . Other fatigue 04/23/2007  .  Vitamin D deficiency 01/22/2007  . Migraine headache 01/22/2007  . Interstitial cystitis 01/22/2007  . MENOPAUSE-RELATED VASOMOTOR SYMPTOMS 01/22/2007  . OSTEOARTHROSIS, GENERALIZED, MULTIPLE SITES 01/22/2007  . Fibromyalgia 01/22/2007  . PATENT FORAMEN OVALE 01/22/2007   Past Medical History:  Diagnosis Date  . Arthritis   . Cataract   . Chronic fatigue   . Early onset macular degeneration   . Fibromyalgia   . Hemorrhoids   . Hyperlipidemia   . Hypersomnia   . Hypertension   . IBS (irritable bowel syndrome)   . Interstitial cystitis   . Migraines   . Osteopenia   . Pelvic floor dysfunction   . PFO (patent foramen ovale)   . Tendonitis    hand  . Vitamin D deficiency    Past Surgical History:  Procedure Laterality Date  . ANKLE ARTHROSCOPY Right   . ANKLE FRACTURE SURGERY Left   . BLADDER SURGERY     and hydrodistension  . BREAST EXCISIONAL BIOPSY Bilateral    Multiple Benign Lumps removed    . BREAST LUMPECTOMY     many, bilateral  . CERVICAL DISCECTOMY  2006   fusion and plating  . CHOLECYSTECTOMY N/A 09/26/2015   Procedure: LAPAROSCOPIC CHOLECYSTECTOMY WITH INTRAOPERATIVE CHOLANGIOGRAM;  Surgeon: Johnathan Hausen, MD;  Location: [facility] ORS;  Service: General;  Laterality: N/A;  . COLONOSCOPY    . FRACTURE SURGERY  ankle fracture x 2  . NASAL SINUS SURGERY     multiple, x4  . UPPER GASTROINTESTINAL ENDOSCOPY    . VAGINAL HYSTERECTOMY  1990   because of the IC partial   Social History   Tobacco Use  . Smoking status: Never Smoker  . Smokeless tobacco: Never Used  Substance Use Topics  . Alcohol use: No    Alcohol/week: 0.0 standard drinks  . Drug use: No   Family History  Problem Relation Age of Onset  . Diabetes Father   . Alcohol abuse Father   . Heart disease Father   . Fibromyalgia Mother   . Colon cancer Paternal Uncle   . Stomach cancer Paternal Grandfather   . Diabetes Paternal Grandfather   . Diabetes Paternal Grandmother   . Stroke  Paternal Grandmother   . Breast cancer Neg Hx    Allergies  Allergen Reactions  . Buspirone Hcl     REACTION: dystonia  . Simvastatin     REACTION: malaise  . Sulfa Antibiotics     nausea   Current Outpatient Medications on File Prior to Visit  Medication Sig Dispense Refill  . atenolol (TENORMIN) 100 MG tablet Take 1 tablet (100 mg total) by mouth daily. 90 tablet 3  . botulinum toxin Type A (BOTOX) 100 units SOLR injection Inject 200 Units into the muscle once.     . calcium carbonate (TUMS - DOSED IN MG ELEMENTAL CALCIUM) 500 MG chewable tablet Chew 1 tablet by mouth daily as needed for indigestion or heartburn.    . cholecalciferol (VITAMIN D) 1000 UNITS tablet Take 2,000 Units by mouth 2 (two) times daily.     . diclofenac sodium (VOLTAREN) 1 % GEL Apply 3 g topically 3 (three) times daily as needed.  3  . diclofenac sodium (VOLTAREN) 1 % GEL Apply 3 g to 3 large joints up to 3 times daily 3 Tube 3  . DULoxetine (CYMBALTA) 60 MG capsule TAKE 1 CAPSULE BY MOUTH EVERY DAY 90 capsule 1  . famotidine (PEPCID) 20 MG tablet Take 20 mg by mouth 2 (two) times daily as needed for heartburn or indigestion.    . fentaNYL (DURAGESIC - DOSED MCG/HR) 50 MCG/HR Place 50 mcg onto the skin every other day.     . hydrOXYzine (ATARAX/VISTARIL) 25 MG tablet     . LUTEIN PO Take 1 tablet by mouth daily.    . nitrofurantoin (MACRODANTIN) 50 MG capsule Take 50 mg by mouth daily.    . pentosan polysulfate (ELMIRON) 100 MG capsule Take 200 mg by mouth 2 (two) times daily.      No current facility-administered medications on file prior to visit.      Review of Systems  Constitutional: Positive for fatigue. Negative for activity change, appetite change and fever.  HENT: Negative for congestion and sore throat.   Eyes: Negative for itching and visual disturbance.  Respiratory: Negative for cough and shortness of breath.   Cardiovascular: Negative for leg swelling.  Gastrointestinal: Negative for  abdominal distention, abdominal pain, constipation, diarrhea and nausea.  Endocrine: Negative for cold intolerance and polydipsia.  Genitourinary: Positive for dysuria, frequency and urgency. Negative for difficulty urinating, flank pain and hematuria.  Musculoskeletal: Positive for arthralgias and myalgias.  Skin: Negative for rash.  Allergic/Immunologic: Negative for immunocompromised state.  Neurological: Negative for dizziness and weakness.  Hematological: Negative for adenopathy.       Objective:   Physical Exam  Constitutional: She appears well-developed and well-nourished. No distress.  HENT:  Head: Normocephalic and atraumatic.  Eyes: Pupils are equal, round, and reactive to light. Conjunctivae and EOM are normal.  Neck: Normal range of motion. Neck supple.  Cardiovascular: Normal rate, regular rhythm and normal heart sounds.  Pulmonary/Chest: Effort normal and breath sounds normal.  Abdominal: Soft. Bowel sounds are normal. She exhibits no distension. There is tenderness. There is no rebound.  No cva tenderness  Mild suprapubic tenderness  Musculoskeletal: She exhibits no edema.  Slow gait due to myofascial  pain)   Lymphadenopathy:    She has no cervical adenopathy.  Neurological: She is alert.  Skin: No rash noted.  Psychiatric: She has a normal mood and affect.          Assessment & Plan:   Problem List Items Addressed This Visit      Genitourinary   UTI (urinary tract infection) - Primary    Pos ua  Already on macrodantin proph  Px keflex  Inc fluids Update if worse or not imp in several days Ucx pending   Meds ordered this encounter  Medications  . cephALEXin (KEFLEX) 500 MG capsule    Sig: Take 1 capsule (500 mg total) by mouth 2 (two) times daily.    Dispense:  14 capsule    Refill:  0         Relevant Medications   cephALEXin (KEFLEX) 500 MG capsule   Other Relevant Orders   Urine Culture (Completed)     Other   Dysuria   Relevant  Orders   POCT Urinalysis Dipstick (Automated) (Completed)

## 2018-01-30 NOTE — Assessment & Plan Note (Addendum)
Pos ua  Already on macrodantin proph  Px keflex  Inc fluids Update if worse or not imp in several days Ucx pending   Meds ordered this encounter  Medications  . cephALEXin (KEFLEX) 500 MG capsule    Sig: Take 1 capsule (500 mg total) by mouth 2 (two) times daily.    Dispense:  14 capsule    Refill:  0

## 2018-02-02 LAB — URINE CULTURE
MICRO NUMBER:: 91317601
SPECIMEN QUALITY:: ADEQUATE

## 2018-03-13 ENCOUNTER — Other Ambulatory Visit: Payer: Self-pay | Admitting: Family Medicine

## 2018-03-13 DIAGNOSIS — Z1231 Encounter for screening mammogram for malignant neoplasm of breast: Secondary | ICD-10-CM

## 2018-03-17 ENCOUNTER — Other Ambulatory Visit: Payer: Self-pay | Admitting: Rheumatology

## 2018-03-17 NOTE — Telephone Encounter (Signed)
Last Visit: 01/01/18 Next visit: 07/02/18  Okay to refill per Dr. Estanislado Pandy

## 2018-04-15 DIAGNOSIS — G518 Other disorders of facial nerve: Secondary | ICD-10-CM | POA: Diagnosis not present

## 2018-04-15 DIAGNOSIS — M542 Cervicalgia: Secondary | ICD-10-CM | POA: Diagnosis not present

## 2018-04-15 DIAGNOSIS — G43719 Chronic migraine without aura, intractable, without status migrainosus: Secondary | ICD-10-CM | POA: Diagnosis not present

## 2018-04-15 DIAGNOSIS — M791 Myalgia, unspecified site: Secondary | ICD-10-CM | POA: Diagnosis not present

## 2018-04-23 DIAGNOSIS — R5382 Chronic fatigue, unspecified: Secondary | ICD-10-CM | POA: Diagnosis not present

## 2018-04-23 DIAGNOSIS — N3010 Interstitial cystitis (chronic) without hematuria: Secondary | ICD-10-CM | POA: Diagnosis not present

## 2018-04-23 DIAGNOSIS — K582 Mixed irritable bowel syndrome: Secondary | ICD-10-CM | POA: Diagnosis not present

## 2018-04-23 DIAGNOSIS — M797 Fibromyalgia: Secondary | ICD-10-CM | POA: Diagnosis not present

## 2018-05-13 ENCOUNTER — Ambulatory Visit
Admission: RE | Admit: 2018-05-13 | Discharge: 2018-05-13 | Disposition: A | Discharge status: Home / Self Care | Payer: Medicare Other | Source: Ambulatory Visit | Attending: Family Medicine | Admitting: Family Medicine

## 2018-05-13 DIAGNOSIS — E2839 Other primary ovarian failure: Secondary | ICD-10-CM

## 2018-05-13 DIAGNOSIS — Z1231 Encounter for screening mammogram for malignant neoplasm of breast: Secondary | ICD-10-CM | POA: Diagnosis not present

## 2018-05-13 DIAGNOSIS — Z780 Asymptomatic menopausal state: Secondary | ICD-10-CM | POA: Diagnosis not present

## 2018-05-13 DIAGNOSIS — M85852 Other specified disorders of bone density and structure, left thigh: Secondary | ICD-10-CM | POA: Diagnosis not present

## 2018-05-13 NOTE — Progress Notes (Signed)
C/w Osteopenia, advise resistive exercises., Vit D and calcium.

## 2018-06-29 NOTE — Progress Notes (Deleted)
Virtual Visit via Telephone Note  I connected with Lindsey [name] on 06/29/18 at 11:30 AM EDT by telephone and verified that I am speaking with the correct person using two identifiers.   I discussed the limitations, risks, security and privacy concerns of performing an evaluation and management service by telephone and the availability of in person appointments. I also discussed with the patient that there may be a patient responsible charge related to this service. The patient expressed understanding and agreed to proceed.   History of Present Illness:    ROS Observations/Objective: Physical Exam  Assessment and Plan: Visit Diagnoses: Fibromyalgia:   Osteopenia of multiple sites - Her DEXA was done by her PCP.   Primary insomnia:   Other fatigue:   Primary osteoarthritis of both hands:   History of vitamin D deficiency:   Interstitial cystitis: She follows up with Dr. Amalia Hailey every 3 months.  Other medical conditions are listed as follows:  History of hyperlipidemia  History of hypertension  History of IBS  History of depression  History of hyperglycemia  History of recurrent cystitis (IC)  S/P laparoscopic cholecystectomy June 2017    Follow Up Instructions:    I discussed the assessment and treatment plan with the patient. The patient was provided an opportunity to ask questions and all were answered. The patient agreed with the plan and demonstrated an understanding of the instructions.   The patient was advised to call back or seek an in-person evaluation if the symptoms worsen or if the condition fails to improve as anticipated.  I provided *** minutes of non-face-to-face time during this encounter.   Earnestine Mealing, CMA

## 2018-07-02 ENCOUNTER — Encounter: Payer: Self-pay | Admitting: Rheumatology

## 2018-07-02 ENCOUNTER — Telehealth (INDEPENDENT_AMBULATORY_CARE_PROVIDER_SITE_OTHER): Payer: Medicare Other | Admitting: Rheumatology

## 2018-07-02 DIAGNOSIS — M8589 Other specified disorders of bone density and structure, multiple sites: Secondary | ICD-10-CM

## 2018-07-02 DIAGNOSIS — Z9049 Acquired absence of other specified parts of digestive tract: Secondary | ICD-10-CM

## 2018-07-02 DIAGNOSIS — Z8659 Personal history of other mental and behavioral disorders: Secondary | ICD-10-CM

## 2018-07-02 DIAGNOSIS — M19041 Primary osteoarthritis, right hand: Secondary | ICD-10-CM | POA: Diagnosis not present

## 2018-07-02 DIAGNOSIS — M797 Fibromyalgia: Secondary | ICD-10-CM | POA: Diagnosis not present

## 2018-07-02 DIAGNOSIS — N3010 Interstitial cystitis (chronic) without hematuria: Secondary | ICD-10-CM

## 2018-07-02 DIAGNOSIS — Z87440 Personal history of urinary (tract) infections: Secondary | ICD-10-CM

## 2018-07-02 DIAGNOSIS — F5101 Primary insomnia: Secondary | ICD-10-CM | POA: Diagnosis not present

## 2018-07-02 DIAGNOSIS — M19042 Primary osteoarthritis, left hand: Secondary | ICD-10-CM

## 2018-07-02 DIAGNOSIS — R5383 Other fatigue: Secondary | ICD-10-CM

## 2018-07-02 DIAGNOSIS — Z8639 Personal history of other endocrine, nutritional and metabolic disease: Secondary | ICD-10-CM

## 2018-07-02 DIAGNOSIS — Z8719 Personal history of other diseases of the digestive system: Secondary | ICD-10-CM

## 2018-07-02 DIAGNOSIS — Z8679 Personal history of other diseases of the circulatory system: Secondary | ICD-10-CM

## 2018-07-02 NOTE — Progress Notes (Signed)
Virtual Visit via Telephone Note  I connected with Lindsey [name] on 07/02/18 at 11:30 AM EDT by telephone and verified that I am speaking with the correct person using two identifiers.   I discussed the limitations, risks, security and privacy concerns of performing an evaluation and management service by telephone and the availability of in person appointments. I also discussed with the patient that there may be a patient responsible charge related to this service. The patient expressed understanding and agreed to proceed.  CC: Fatigue   History of Present Illness: Patient is a 63 year old female with a past medical history of fibromyalgia, osteopenia, and osteoarthritis.  She is taking cymbalta 60 mg 1 capsule by mouth daily.  She states her fibromyalgia is currently flaring due to the increased stress surrounding the coronavirus. She is having increased myalgias and muscle tenderness. She is having generalized pain. She has chronic fatigue.  She has been having difficulty sleeping at night.  She has tried several sleep aids which have not been effective. She states she has also started snoring.  She has had sleep studies in the past. She is having increased pain in both hand and both wrist joints.  She denies any joint swelling.     Review of Systems  Constitutional: Positive for malaise/fatigue. Negative for fever.  Eyes: Negative for photophobia, pain, discharge and redness.  Respiratory: Negative for cough, shortness of breath and wheezing.   Cardiovascular: Negative for chest pain and palpitations.  Gastrointestinal: Negative for blood in stool, constipation and diarrhea.  Genitourinary: Negative for dysuria.  Musculoskeletal: Positive for joint pain and myalgias. Negative for back pain and neck pain.  Skin: Negative for rash.  Neurological: Negative for dizziness and headaches.  Psychiatric/Behavioral: Positive for depression. The patient is not nervous/anxious and does not have  insomnia.     Observations/Objective: Physical Exam  Constitutional: She is oriented to person, place, and time.  Neurological: She is alert and oriented to person, place, and time.  Psychiatric: Mood, memory, affect and judgment normal.    Patient reports nocturnal pain.  Difficulty dressing/grooming: Denies Difficulty climbing stairs: Reports Difficulty getting out of chair: Reports Difficulty using hands for taps, buttons, cutlery, and/or writing: Reports  Assessment and Plan: Fibromyalgia: She has generalized muscle aches and muscle tenderness.  She is currently having a fibromyalgia flare due to increased stress surrounding the risk of coronavirus.  She is having increased muscle tension and spasms.  She is taking Cymbalta 60 mg 1 capsule by mouth daily.  She has chronic fatigue and insomnia.  She has tried several sleep aids in the past which were ineffective.  She has started snoring so we discussed scheduling a sleep study in the future.  The importance of regular exercise and good sleep hygiene was discussed.  She does not need any refills at this time.  She will follow up in 6 months.   Other fatigue: She has chronic fatigue related to insomnia.  She was encouraged to stay active and exercise regularly.  Primary insomnia: She has difficulty sleeping at night.  Good sleep hygiene was discussed.  She has tried taking sleep aids in the past which was ineffective.  She will have PCP refer for a sleep study in the future.  Osteopenia of multiple sites: DEXA is ordered by PCP.  She is taking a vitamin D supplement.   Primary osteoarthritis of both hands: She has intermittent pain in both hands and wrist joints.  She has no joint swelling.  Interstitial cystitis: She is followed by Dr. Amalia Hailey.   Follow Up Instructions: She will follow up in the office in 6 months.  She does not need any refills at this time. She was advised to have PCP forward labs to [place] after her yearly physical  exam.   I discussed the assessment and treatment plan with the patient. The patient was provided an opportunity to ask questions and all were answered. The patient agreed with the plan and demonstrated an understanding of the instructions.   The patient was advised to call back or seek an in-person evaluation if the symptoms worsen or if the condition fails to improve as anticipated.  I provided 15 minutes of non-face-to-face time during this encounter.  Bo Merino, MD  Scribed by- Ofilia Neas, PA-C

## 2018-07-15 DIAGNOSIS — M791 Myalgia, unspecified site: Secondary | ICD-10-CM | POA: Diagnosis not present

## 2018-07-15 DIAGNOSIS — M542 Cervicalgia: Secondary | ICD-10-CM | POA: Diagnosis not present

## 2018-07-15 DIAGNOSIS — G518 Other disorders of facial nerve: Secondary | ICD-10-CM | POA: Diagnosis not present

## 2018-07-15 DIAGNOSIS — G43719 Chronic migraine without aura, intractable, without status migrainosus: Secondary | ICD-10-CM | POA: Diagnosis not present

## 2018-07-23 DIAGNOSIS — N3010 Interstitial cystitis (chronic) without hematuria: Secondary | ICD-10-CM | POA: Diagnosis not present

## 2018-08-07 ENCOUNTER — Ambulatory Visit (INDEPENDENT_AMBULATORY_CARE_PROVIDER_SITE_OTHER): Payer: Medicare Other

## 2018-08-07 DIAGNOSIS — Z0000 Encounter for general adult medical examination without abnormal findings: Secondary | ICD-10-CM | POA: Diagnosis not present

## 2018-08-07 NOTE — Progress Notes (Signed)
PCP notes:   Health maintenance:  No gaps identified.   Abnormal screenings:   None  Patient concerns:   None  Nurse concerns:  None  Next PCP appt:   08/11/18 @ 1430  I reviewed health advisor's note, was available for consultation, and agree with documentation and plan. [name] MD

## 2018-08-07 NOTE — Patient Instructions (Signed)
Ms. Schneck , Thank you for taking time to come for your Medicare Wellness Visit. I appreciate your ongoing commitment to your health goals. Please review the following plan we discussed and let me know if I can assist you in the future.   These are the goals we discussed: Goals    . Patient Stated     Starting 08/07/2018, I will continue to take medications as prescribed.        This is a list of the screening recommended for you and due dates:  Health Maintenance  Topic Date Due  . Flu Shot  10/31/2018  . Mammogram  05/13/2020  . Colon Cancer Screening  08/08/2020  . Tetanus Vaccine  06/18/2027  .  Hepatitis C: One time screening is recommended by Center for Disease Control  (CDC) for  adults born from [birth year] through 1964.   Completed  . HIV Screening  Completed   Preventive Care for Adults  A healthy lifestyle and preventive care can promote health and wellness. Preventive health guidelines for adults include the following key practices.  . A routine yearly physical is a good way to check with your health care provider about your health and preventive screening. It is a chance to share any concerns and updates on your health and to receive a thorough exam.  . Visit your dentist for a routine exam and preventive care every 6 months. Brush your teeth twice a day and floss once a day. Good oral hygiene prevents tooth decay and gum disease.  . The frequency of eye exams is based on your age, health, family medical history, use  of contact lenses, and other factors. Follow your health care provider's recommendations for frequency of eye exams.  . Eat a healthy diet. Foods like vegetables, fruits, whole grains, low-fat dairy products, and lean protein foods contain the nutrients you need without too many calories. Decrease your intake of foods high in solid fats, added sugars, and salt. Eat the right amount of calories for you. Get information about a proper diet from your health care  provider, if necessary.  . Regular physical exercise is one of the most important things you can do for your health. Most adults should get at least 150 minutes of moderate-intensity exercise (any activity that increases your heart rate and causes you to sweat) each week. In addition, most adults need muscle-strengthening exercises on 2 or more days a week.  Silver Sneakers may be a benefit available to you. To determine eligibility, you may visit the website: www.silversneakers.com or contact program at (364)206-8073 Mon-Fri between 8AM-8PM.   . Maintain a healthy weight. The body mass index (BMI) is a screening tool to identify possible weight problems. It provides an estimate of body fat based on height and weight. Your health care provider can find your BMI and can help you achieve or maintain a healthy weight.   For adults 20 years and older: ? A BMI below 18.5 is considered underweight. ? A BMI of 18.5 to 24.9 is normal. ? A BMI of 25 to 29.9 is considered overweight. ? A BMI of 30 and above is considered obese.   . Maintain normal blood lipids and cholesterol levels by exercising and minimizing your intake of saturated fat. Eat a balanced diet with plenty of fruit and vegetables. Blood tests for lipids and cholesterol should begin at age 84 and be repeated every 5 years. If your lipid or cholesterol levels are high, you are over 50, or you  are at high risk for heart disease, you may need your cholesterol levels checked more frequently. Ongoing high lipid and cholesterol levels should be treated with medicines if diet and exercise are not working.  . If you smoke, find out from your health care provider how to quit. If you do not use tobacco, please do not start.  . If you choose to drink alcohol, please do not consume more than 2 drinks per day. One drink is considered to be 12 ounces (355 mL) of beer, 5 ounces (148 mL) of wine, or 1.5 ounces (44 mL) of liquor.  . If you are 88-79 years  old, ask your health care provider if you should take aspirin to prevent strokes.  . Use sunscreen. Apply sunscreen liberally and repeatedly throughout the day. You should seek shade when your shadow is shorter than you. Protect yourself by wearing long sleeves, pants, a wide-brimmed hat, and sunglasses year round, whenever you are outdoors.  . Once a month, do a whole body skin exam, using a mirror to look at the skin on your back. Tell your health care provider of new moles, moles that have irregular borders, moles that are larger than a pencil eraser, or moles that have changed in shape or color.

## 2018-08-07 NOTE — Progress Notes (Signed)
Subjective:   Lindsey [name] is a 63 y.o. female who presents for Medicare Annual (Subsequent) preventive examination.  Review of Systems:  N/A Cardiac Risk Factors include: dyslipidemia;hypertension;obesity (BMI >30kg/m2)     Objective:     Vitals: There were no vitals taken for this visit.  There is no height or weight on file to calculate BMI.  Advanced Directives 08/05/2017 04/23/2016 09/20/2015  Does Patient Have a Medical Advance Directive? No No No  Would patient like information on creating a medical advance directive? No - Patient declined - -    Tobacco Social History   Tobacco Use  Smoking Status Never Smoker  Smokeless Tobacco Never Used     Counseling given: No   Clinical Intake:  Pre-visit preparation completed: Yes  Pain : 0-10 Pain Score: 5  Pain Type: Chronic pain Pain Location: Generalized     Nutritional Status: BMI 25 -29 Overweight Nutritional Risks: None Diabetes: Yes CBG done?: No Did pt. bring in CBG monitor from home?: No  How often do you need to have someone help you when you read instructions, pamphlets, or other written materials from your doctor or pharmacy?: 1 - Never  Interpreter Needed?: No  Comments: pt lives with spouse Information entered by :: LPinson, LPN  Past Medical History:  Diagnosis Date  . Arthritis   . Cataract   . Chronic fatigue   . Early onset macular degeneration   . Fibromyalgia   . Hemorrhoids   . Hyperlipidemia   . Hypersomnia   . Hypertension   . IBS (irritable bowel syndrome)   . Interstitial cystitis   . Migraines   . Osteopenia   . Pelvic floor dysfunction   . PFO (patent foramen ovale)   . Tendonitis    hand  . Vitamin D deficiency    Past Surgical History:  Procedure Laterality Date  . ANKLE ARTHROSCOPY Right   . ANKLE FRACTURE SURGERY Left   . BLADDER SURGERY     and hydrodistension  . BREAST EXCISIONAL BIOPSY Bilateral    Multiple Benign Lumps removed    . BREAST LUMPECTOMY     many, bilateral  . CERVICAL DISCECTOMY  2006   fusion and plating  . CHOLECYSTECTOMY N/A 09/26/2015   Procedure: LAPAROSCOPIC CHOLECYSTECTOMY WITH INTRAOPERATIVE CHOLANGIOGRAM;  Surgeon: Johnathan Hausen, MD;  Location: [facility] ORS;  Service: General;  Laterality: N/A;  . COLONOSCOPY    . FRACTURE SURGERY     ankle fracture x 2  . NASAL SINUS SURGERY     multiple, x4  . UPPER GASTROINTESTINAL ENDOSCOPY    . VAGINAL HYSTERECTOMY  1990   because of the IC partial   Family History  Problem Relation Age of Onset  . Diabetes Father   . Alcohol abuse Father   . Heart disease Father   . Fibromyalgia Mother   . Colon cancer Paternal Uncle   . Stomach cancer Paternal Grandfather   . Diabetes Paternal Grandfather   . Diabetes Paternal Grandmother   . Stroke Paternal Grandmother   . Breast cancer Neg Hx    Social History   Socioeconomic History  . Marital status: Married    Spouse name: Not on file  . Number of children: 0  . Years of education: Not on file  . Highest education level: Not on file  Occupational History  . Occupation: Disabled from [place]: [employer]  . Financial resource strain: Not on file  . Food insecurity:  Worry: Not on file    Inability: Not on file  . Transportation needs:    Medical: Not on file    Non-medical: Not on file  Tobacco Use  . Smoking status: Never Smoker  . Smokeless tobacco: Never Used  Substance and Sexual Activity  . Alcohol use: No    Alcohol/week: 0.0 standard drinks  . Drug use: No  . Sexual activity: Yes  Lifestyle  . Physical activity:    Days per week: Not on file    Minutes per session: Not on file  . Stress: Not on file  Relationships  . Social connections:    Talks on phone: Not on file    Gets together: Not on file    Attends religious service: Not on file    Active member of club or organization: Not on file    Attends meetings of clubs or organizations: Not on file    Relationship status: Not on  file  Other Topics Concern  . Not on file  Social History Narrative  . Not on file    Outpatient Encounter Medications as of 08/07/2018  Medication Sig  . atenolol (TENORMIN) 100 MG tablet Take 1 tablet (100 mg total) by mouth daily.  . botulinum toxin Type A (BOTOX) 100 units SOLR injection Inject 200 Units into the muscle once.   . calcium carbonate (TUMS - DOSED IN MG ELEMENTAL CALCIUM) 500 MG chewable tablet Chew 1 tablet by mouth daily as needed for indigestion or heartburn.  . cholecalciferol (VITAMIN D) 1000 UNITS tablet Take 2,000 Units by mouth 2 (two) times daily.   Marland Kitchen DIAZEPAM RE Place 10 mg vaginally at bedtime.  . diclofenac sodium (VOLTAREN) 1 % GEL Apply 3 g to 3 large joints up to 3 times daily  . DULoxetine (CYMBALTA) 60 MG capsule TAKE 1 CAPSULE BY MOUTH EVERY DAY  . famotidine (PEPCID) 20 MG tablet Take 20 mg by mouth 2 (two) times daily as needed for heartburn or indigestion.  . fentaNYL (DURAGESIC - DOSED MCG/HR) 50 MCG/HR Place 50 mcg onto the skin every other day.   . hydrOXYzine (ATARAX/VISTARIL) 25 MG tablet   . LUTEIN PO Take 1 tablet by mouth daily.  . nitrofurantoin (MACRODANTIN) 50 MG capsule Take 50 mg by mouth daily.  . [DISCONTINUED] pentosan polysulfate (ELMIRON) 100 MG capsule Take 200 mg by mouth 2 (two) times daily.   . [DISCONTINUED] diclofenac sodium (VOLTAREN) 1 % GEL Apply 3 g topically 3 (three) times daily as needed.   No facility-administered encounter medications on file as of 08/07/2018.     Activities of Daily Living In your present state of health, do you have any difficulty performing the following activities: 08/07/2018  Hearing? N  Vision? N  Difficulty concentrating or making decisions? N  Walking or climbing stairs? N  Dressing or bathing? N  Doing errands, shopping? N  Preparing Food and eating ? N  Using the Toilet? N  In the past six months, have you accidently leaked urine? Y  Do you have problems with loss of bowel control? N   Managing your Medications? N  Managing your Finances? N  Housekeeping or managing your Housekeeping? N  Some recent data might be hidden    Patient Care Team: Tower, Wynelle Fanny, MD as PCP - General    Assessment:   This is a routine wellness examination for Lindsey [name].  Vision Screening Comments: Vision exam in 2019 with Dr. Annamaria Helling  Exercise Activities and Dietary recommendations Current  Exercise Habits: The patient does not participate in regular exercise at present, Exercise limited by: neurologic condition(s)  Goals    . Patient Stated     Starting 08/07/2018, I will continue to take medications as prescribed.        Fall Risk Fall Risk  08/07/2018 08/05/2017 04/23/2016 10/31/2014 12/24/2013  Falls in the past year? 0 Yes Yes Yes Yes  Comment - averages 1 fall every 2 mths; lacerations and bruising only  pt reports 4-5 falls without injury; pt states she is having clumsiness - -  Number falls in past yr: - 2 or more 2 or more 2 or more 2 or more  Injury with Fall? - Yes No No -  Risk Factor Category  - High Fall Risk - - High Fall Risk  Risk for fall due to : - Impaired balance/gait - - -   Depression Screen PHQ 2/9 Scores 08/07/2018 08/05/2017 06/17/2017 04/23/2016  PHQ - 2 Score 0 1 3 0  PHQ- 9 Score 0 12 15 -     Cognitive Function MMSE - Mini Mental State Exam 08/07/2018 08/05/2017 04/23/2016  Orientation to time 5 5 5   Orientation to Place 5 5 5   Registration 3 3 3   Attention/ Calculation 0 0 0  Recall 3 3 3   Language- name 2 objects 0 0 0  Language- repeat 1 1 1   Language- follow 3 step command 0 3 3  Language- read & follow direction 0 0 0  Write a sentence 0 0 0  Copy design 0 0 0  Total score 17 20 20      PLEASE NOTE: A Mini-Cog screen was completed. Maximum score is 17. A value of 0 denotes this part of [place] MMSE was not completed or the patient failed this part of the Mini-Cog screening.   Mini-Cog Screening Orientation to Time - Max 5 pts Orientation to Place - Max  5 pts Registration - Max 3 pts Recall - Max 3 pts Language Repeat - Max 1 pts      Immunization History  Administered Date(s) Administered  . Influenza Inj Mdck Quad Pf 12/16/2017  . Influenza Whole 12/31/2006  . Influenza,inj,Quad PF,6+ Mos 12/24/2013, 02/20/2016, 10/30/2016, 11/30/2016  . Pneumococcal Polysaccharide-23 09/04/2012  . Td 04/23/2007  . Tdap 06/17/2017    Screening Tests Health Maintenance  Topic Date Due  . INFLUENZA VACCINE  10/31/2018  . MAMMOGRAM  05/13/2020  . COLONOSCOPY  08/08/2020  . TETANUS/TDAP  06/18/2027  . Hepatitis C Screening  Completed  . HIV Screening  Completed     Plan:     I have personally reviewed, addressed, and noted the following in the patient's chart:  A. Medical and social history B. Use of alcohol, tobacco or illicit drugs  C. Current medications and supplements D. Functional ability and status E.  Nutritional status F.  Physical activity G. Advance directives H. List of other physicians I.  Hospitalizations, surgeries, and ER visits in previous 12 months J.  Vitals (unless it is a telemedicine encounter) K. Screenings to include cognitive, depression, hearing, vision (NOTE: hearing and vision screenings not completed in telemedicine encounter) L. Referrals and appointments   In addition, I have reviewed and discussed with patient certain preventive protocols, quality metrics, and best practice recommendations. A written personalized care plan for preventive services and recommendations were provided to patient.  With patient's permission, we connected on 08/07/18 at 12:30 PM EDT by a video enabled telemedicine application. Two patient identifiers were used  to ensure the encounter occurred with the correct person.    Patient was in home and writer was in office.   Signed,   Lindell Noe, MHA, BS, LPN Health Coach

## 2018-08-09 ENCOUNTER — Encounter: Payer: Self-pay | Admitting: Family Medicine

## 2018-08-09 ENCOUNTER — Telehealth: Payer: Self-pay | Admitting: Family Medicine

## 2018-08-09 DIAGNOSIS — R7303 Prediabetes: Secondary | ICD-10-CM | POA: Insufficient documentation

## 2018-08-09 DIAGNOSIS — E559 Vitamin D deficiency, unspecified: Secondary | ICD-10-CM

## 2018-08-09 DIAGNOSIS — E7800 Pure hypercholesterolemia, unspecified: Secondary | ICD-10-CM

## 2018-08-09 DIAGNOSIS — I10 Essential (primary) hypertension: Secondary | ICD-10-CM

## 2018-08-09 NOTE — Telephone Encounter (Signed)
-----   Message from Ellamae Sia sent at 08/04/2018 12:30 PM EDT ----- Regarding: Lab orders for Monday, 5.11.20 Ab orders, thanks

## 2018-08-10 ENCOUNTER — Other Ambulatory Visit (INDEPENDENT_AMBULATORY_CARE_PROVIDER_SITE_OTHER): Payer: Medicare Other

## 2018-08-10 ENCOUNTER — Other Ambulatory Visit: Payer: Self-pay

## 2018-08-10 DIAGNOSIS — R7303 Prediabetes: Secondary | ICD-10-CM | POA: Diagnosis not present

## 2018-08-10 DIAGNOSIS — E559 Vitamin D deficiency, unspecified: Secondary | ICD-10-CM

## 2018-08-10 DIAGNOSIS — E7800 Pure hypercholesterolemia, unspecified: Secondary | ICD-10-CM | POA: Diagnosis not present

## 2018-08-10 DIAGNOSIS — I10 Essential (primary) hypertension: Secondary | ICD-10-CM

## 2018-08-10 LAB — COMPREHENSIVE METABOLIC PANEL
ALT: 21 U/L (ref 0–35)
AST: 17 U/L (ref 0–37)
Albumin: 4 g/dL (ref 3.5–5.2)
Alkaline Phosphatase: 74 U/L (ref 39–117)
BUN: 10 mg/dL (ref 6–23)
CO2: 30 mEq/L (ref 19–32)
Calcium: 9.7 mg/dL (ref 8.4–10.5)
Chloride: 100 mEq/L (ref 96–112)
Creatinine, Ser: 0.7 mg/dL (ref 0.40–1.20)
GFR: 84.84 mL/min (ref >60.00)
Glucose, Bld: 110 mg/dL — ABNORMAL HIGH (ref 70–99)
Potassium: 4.8 mEq/L (ref 3.5–5.1)
Sodium: 137 mEq/L (ref 135–145)
Total Bilirubin: 0.5 mg/dL (ref 0.2–1.2)
Total Protein: 7 g/dL (ref 6.0–8.3)

## 2018-08-10 LAB — CBC WITH DIFFERENTIAL/PLATELET
Basophils Absolute: 0 10*3/uL (ref 0.0–0.1)
Basophils Relative: 0.3 % (ref 0.0–3.0)
Eosinophils Absolute: 0.1 10*3/uL (ref 0.0–0.7)
Eosinophils Relative: 1.9 % (ref 0.0–5.0)
HCT: 42.8 % (ref 36.0–46.0)
Hemoglobin: 14.4 g/dL (ref 12.0–15.0)
Lymphocytes Relative: 46.7 % — ABNORMAL HIGH (ref 12.0–46.0)
Lymphs Abs: 2.8 10*3/uL (ref 0.7–4.0)
MCHC: 33.6 g/dL (ref 30.0–36.0)
MCV: 89.7 fl (ref 78.0–100.0)
Monocytes Absolute: 0.4 10*3/uL (ref 0.1–1.0)
Monocytes Relative: 7.1 % (ref 3.0–12.0)
Neutro Abs: 2.7 10*3/uL (ref 1.4–7.7)
Neutrophils Relative %: 44 % (ref 43.0–77.0)
Platelets: 176 10*3/uL (ref 150.0–400.0)
RBC: 4.77 Mil/uL (ref 3.87–5.11)
RDW: 14.7 % (ref 11.5–15.5)
WBC: 6.1 10*3/uL (ref 4.0–10.5)

## 2018-08-10 LAB — LIPID PANEL
Cholesterol: 182 mg/dL (ref 0–200)
HDL: 55.3 mg/dL (ref >39.00)
LDL Cholesterol: 113 mg/dL — ABNORMAL HIGH (ref 0–99)
NonHDL: 126.49
Total CHOL/HDL Ratio: 3
Triglycerides: 69 mg/dL (ref 0.0–149.0)
VLDL: 13.8 mg/dL (ref 0.0–40.0)

## 2018-08-10 LAB — VITAMIN D 25 HYDROXY (VIT D DEFICIENCY, FRACTURES): VITD: 46.86 ng/mL (ref 30.00–100.00)

## 2018-08-10 LAB — TSH: TSH: 2.19 u[IU]/mL (ref 0.35–4.50)

## 2018-08-10 LAB — HEMOGLOBIN A1C: Hgb A1c MFr Bld: 6.1 % (ref 4.6–6.5)

## 2018-08-11 ENCOUNTER — Ambulatory Visit (INDEPENDENT_AMBULATORY_CARE_PROVIDER_SITE_OTHER): Payer: Medicare Other | Admitting: Family Medicine

## 2018-08-11 ENCOUNTER — Encounter: Payer: Self-pay | Admitting: Family Medicine

## 2018-08-11 VITALS — Wt 175.0 lb

## 2018-08-11 DIAGNOSIS — R4589 Other symptoms and signs involving emotional state: Secondary | ICD-10-CM

## 2018-08-11 DIAGNOSIS — E7800 Pure hypercholesterolemia, unspecified: Secondary | ICD-10-CM | POA: Diagnosis not present

## 2018-08-11 DIAGNOSIS — M359 Systemic involvement of connective tissue, unspecified: Secondary | ICD-10-CM

## 2018-08-11 DIAGNOSIS — E559 Vitamin D deficiency, unspecified: Secondary | ICD-10-CM | POA: Diagnosis not present

## 2018-08-11 DIAGNOSIS — M8589 Other specified disorders of bone density and structure, multiple sites: Secondary | ICD-10-CM

## 2018-08-11 DIAGNOSIS — F329 Major depressive disorder, single episode, unspecified: Secondary | ICD-10-CM

## 2018-08-11 DIAGNOSIS — I10 Essential (primary) hypertension: Secondary | ICD-10-CM

## 2018-08-11 DIAGNOSIS — R7303 Prediabetes: Secondary | ICD-10-CM

## 2018-08-11 DIAGNOSIS — N3010 Interstitial cystitis (chronic) without hematuria: Secondary | ICD-10-CM

## 2018-08-11 MED ORDER — ATENOLOL 100 MG PO TABS: 100.0000 mg | ORAL_TABLET | Freq: Every day | ORAL | 3 refills | Status: DC

## 2018-08-11 NOTE — Assessment & Plan Note (Signed)
.  Disc goals for lipids and reasons to control them Rev last labs with pt Rev low sat fat diet in detail  LDL is down  Enc her to keep working on diet

## 2018-08-11 NOTE — Assessment & Plan Note (Signed)
Lab Results  Component Value Date   HGBA1C 6.1 08/10/2018   Disc eliminating some sweets  disc imp of low glycemic diet and wt loss to prevent DM2

## 2018-08-11 NOTE — Assessment & Plan Note (Signed)
dexa 2/20 - osteopenia (sees Dr Garen Grams) No falls or fractures  D level is therapeutic at 46.8 Enc exercise as tolerated dexa every 2 y

## 2018-08-11 NOTE — Patient Instructions (Signed)
A1C is improved  Cholesterol is also improved   Stay as active as your body will let you be Get outdoors when you can  Stay in touch with Dr Amalia Hailey and Dr Garen Grams  Diet wise Try to get most of your carbohydrates from produce (with the exception of white potatoes)  Eat less bread/pasta/rice/snack foods/cereals/sweets and other items from the middle of the grocery store (processed carbs) Avoid red meat/ fried foods/ egg yolks/ fatty breakfast meats/ butter, cheese and high fat dairy/ and shellfish

## 2018-08-11 NOTE — Assessment & Plan Note (Signed)
Continues f/u with Dr Garen Grams and tx for chronic pain

## 2018-08-11 NOTE — Progress Notes (Signed)
Virtual Visit via Video Note  I connected with Lindsey [name] on 08/11/18 at  2:30 PM EDT by a video enabled telemedicine application and verified that I am speaking with the correct person using two identifiers.  Location: Patient: home Provider: office    I discussed the limitations of evaluation and management by telemedicine and the availability of in person appointments. The patient expressed understanding and agreed to proceed.  History of Present Illness: Pt presents for annual f/u of chronic medical problems   Feeling lousy /not great  A lot going on  IC  Chronic pain -fibromyalgia is flared up -does not tolerate a lot of activity  Does very short amounts of yard work  Some irritability/anxiety  Developed macular degeneration a few years ago (rare at her age) - they now think that the [place] caused it (so she had to come off of it ) - now having some more bladder symptoms (she will see eye doctor and is watched by Dr Amalia Hailey)  Does not sleep well  Diazepam suppositories for vaginismus also help her relax  Also snores   Heartburn wakes her up (takes pepcid)    Had amw on 5/8  No gaps or concerns  Wt Readings from Last 3 Encounters:  01/30/18 172 lb (78 kg)  01/01/18 172 lb 6.4 oz (78.2 kg)  08/11/17 171 lb 8 oz (77.8 kg)  weight at home is 175  bp is stable today  No cp or palpitations or headaches or edema  No side effects to medicines  BP Readings from Last 3 Encounters:  01/30/18 122/84  01/01/18 136/78  08/11/17 122/70     Osteopenia   vit D level 46.8 dexa 2/20   Hyperlipidemia Lab Results  Component Value Date   CHOL 182 08/10/2018   CHOL 209 (H) 06/17/2017   CHOL 185 04/09/2016   Lab Results  Component Value Date   HDL 55.30 08/10/2018   HDL 61.20 06/17/2017   HDL 54.70 04/09/2016   Lab Results  Component Value Date   LDLCALC 113 (H) 08/10/2018   LDLCALC 131 (H) 06/17/2017   LDLCALC 119 (H) 04/09/2016   Lab Results  Component Value Date    TRIG 69.0 08/10/2018   TRIG 87.0 06/17/2017   TRIG 61.0 04/09/2016   Lab Results  Component Value Date   CHOLHDL 3 08/10/2018   CHOLHDL 3 06/17/2017   CHOLHDL 3 04/09/2016   Lab Results  Component Value Date   LDLDIRECT 137.4 11/27/2012   LDLDIRECT 166.1 07/24/2012   LDLDIRECT 161.5 07/03/2011  LDL cholesterol is improved  Eating less in general  Avoids red meat  She air fries things  Does not eat fried fast food as a rule   Lab Results  Component Value Date   HGBA1C 6.1 08/10/2018    Prediabetic - improved from 6.3 110 fasting glucose  She craves sugar   Lab Results  Component Value Date   WBC 6.1 08/10/2018   HGB 14.4 08/10/2018   HCT 42.8 08/10/2018   MCV 89.7 08/10/2018   PLT 176.0 08/10/2018   Lab Results  Component Value Date   CREATININE 0.70 08/10/2018   BUN 10 08/10/2018   NA 137 08/10/2018   K 4.8 08/10/2018   CL 100 08/10/2018   CO2 30 08/10/2018    Lab Results  Component Value Date   ALT 21 08/10/2018   AST 17 08/10/2018   ALKPHOS 74 08/10/2018   BILITOT 0.5 08/10/2018    Lab Results  Component Value Date   TSH 2.19 08/10/2018    Review of Systems  Constitutional: Positive for malaise/fatigue. Negative for chills, fever and weight loss.  HENT: Negative for hearing loss.   Eyes: Positive for blurred vision. Negative for pain, discharge and redness.  Respiratory: Negative for cough and shortness of breath.   Cardiovascular: Negative for chest pain, palpitations and leg swelling.  Gastrointestinal: Positive for heartburn. Negative for abdominal pain, nausea and vomiting.  Genitourinary: Positive for dysuria and frequency. Negative for hematuria.  Musculoskeletal: Positive for back pain, joint pain and myalgias. Negative for falls.  Skin: Negative for itching and rash.  Neurological: Positive for headaches. Negative for dizziness, sensory change, speech change and focal weakness.  Psychiatric/Behavioral: Positive for depression. Negative  for memory loss. The patient is nervous/anxious and has insomnia.     Patient Active Problem List   Diagnosis Date Noted  . Prediabetes 08/09/2018  . Estrogen deficiency 01/28/2018  . Autoimmune disease ([place]) 06/18/2017  . Rash and nonspecific skin eruption 09/04/2016  . History of recurrent cystitis (IC) 08/16/2016  . Pelvic floor dysfunction 08/16/2016  . Dysuria 05/24/2016  . Macular degeneration 04/09/2016  . Osteoarthritis, knee 02/15/2016  . S/P laparoscopic cholecystectomy June 2017 09/26/2015  . Dermatitis 10/31/2014  . Encounter for Medicare annual wellness exam 09/04/2012  . HSV (herpes simplex virus) infection 04/08/2012  . IBS (irritable bowel syndrome) 10/21/2011  . Routine general medical examination at a health care facility 07/02/2011  . Osteopenia 05/17/2009  . Depressed mood 10/05/2008  . GERD 05/05/2008  . OBSTRUCTIVE SLEEP APNEA 06/25/2007  . Hyperlipidemia 05/31/2007  . Essential hypertension 05/31/2007  . Other fatigue 04/23/2007  . Vitamin D deficiency 01/22/2007  . Migraine headache 01/22/2007  . Interstitial cystitis 01/22/2007  . MENOPAUSE-RELATED VASOMOTOR SYMPTOMS 01/22/2007  . OSTEOARTHROSIS, GENERALIZED, MULTIPLE SITES 01/22/2007  . Fibromyalgia 01/22/2007  . PATENT FORAMEN OVALE 01/22/2007   Past Medical History:  Diagnosis Date  . Arthritis   . Cataract   . Chronic fatigue   . Early onset macular degeneration   . Fibromyalgia   . Hemorrhoids   . Hyperglycemia 12/24/2013  . Hyperlipidemia   . Hypersomnia   . Hypertension   . IBS (irritable bowel syndrome)   . Interstitial cystitis   . Migraines   . Osteopenia   . Pelvic floor dysfunction   . PFO (patent foramen ovale)   . Tendonitis    hand  . Vitamin D deficiency    Past Surgical History:  Procedure Laterality Date  . ANKLE ARTHROSCOPY Right   . ANKLE FRACTURE SURGERY Left   . BLADDER SURGERY     and hydrodistension  . BREAST EXCISIONAL BIOPSY Bilateral    Multiple Benign  Lumps removed    . BREAST LUMPECTOMY     many, bilateral  . CERVICAL DISCECTOMY  2006   fusion and plating  . CHOLECYSTECTOMY N/A 09/26/2015   Procedure: LAPAROSCOPIC CHOLECYSTECTOMY WITH INTRAOPERATIVE CHOLANGIOGRAM;  Surgeon: Johnathan Hausen, MD;  Location: [facility] ORS;  Service: General;  Laterality: N/A;  . COLONOSCOPY    . FRACTURE SURGERY     ankle fracture x 2  . NASAL SINUS SURGERY     multiple, x4  . UPPER GASTROINTESTINAL ENDOSCOPY    . VAGINAL HYSTERECTOMY  1990   because of the IC partial   Social History   Tobacco Use  . Smoking status: Never Smoker  . Smokeless tobacco: Never Used  Substance Use Topics  . Alcohol use: No    Alcohol/week: 0.0  standard drinks  . Drug use: No   Family History  Problem Relation Age of Onset  . Diabetes Father   . Alcohol abuse Father   . Heart disease Father   . Fibromyalgia Mother   . Colon cancer Paternal Uncle   . Stomach cancer Paternal Grandfather   . Diabetes Paternal Grandfather   . Diabetes Paternal Grandmother   . Stroke Paternal Grandmother   . Breast cancer Neg Hx    Allergies  Allergen Reactions  . Buspirone Hcl     REACTION: dystonia  . Simvastatin     REACTION: malaise  . Sulfa Antibiotics     nausea   Current Outpatient Medications on File Prior to Visit  Medication Sig Dispense Refill  . botulinum toxin Type A (BOTOX) 100 units SOLR injection Inject 200 Units into the muscle once.     . calcium carbonate (TUMS - DOSED IN MG ELEMENTAL CALCIUM) 500 MG chewable tablet Chew 1 tablet by mouth daily as needed for indigestion or heartburn.    . cholecalciferol (VITAMIN D) 1000 UNITS tablet Take 2,000 Units by mouth 2 (two) times daily.     Marland Kitchen DIAZEPAM RE Place 10 mg vaginally at bedtime.    . diclofenac sodium (VOLTAREN) 1 % GEL Apply 3 g to 3 large joints up to 3 times daily 3 Tube 3  . DULoxetine (CYMBALTA) 60 MG capsule TAKE 1 CAPSULE BY MOUTH EVERY DAY 90 capsule 1  . famotidine (PEPCID) 20 MG tablet Take 20 mg  by mouth 2 (two) times daily as needed for heartburn or indigestion.    . fentaNYL (DURAGESIC - DOSED MCG/HR) 50 MCG/HR Place 50 mcg onto the skin every other day.     . hydrOXYzine (ATARAX/VISTARIL) 25 MG tablet     . LUTEIN PO Take 1 tablet by mouth daily.    . nitrofurantoin (MACRODANTIN) 50 MG capsule Take 50 mg by mouth daily.     No current facility-administered medications on file prior to visit.      Observations/Objective: Patient appears generally well/ somewhat fatigued / like her normal self Able to ambulate while talking Not tearful or distressed No facial swelling or asymmetry  Not hoarse /no slurring  No sob or cough during interview Fair complexion /no rash noted on face or neck No obvious tremor   Assessment and Plan: Problem List Items Addressed This Visit      Cardiovascular and Mediastinum   Essential hypertension - Primary    bp in fair control at this time  BP Readings from Last 1 Encounters:  01/30/18 122/84  pt does not check at home- this reading is from nov Has not felt high or low to her - usually stable on atenolol No changes needed Most recent labs reviewed  Disc lifstyle change with low sodium diet and exercise        Relevant Medications   atenolol (TENORMIN) 100 MG tablet     Musculoskeletal and Integument   Osteopenia    dexa 2/20 - osteopenia (sees Dr Garen Grams) No falls or fractures  D level is therapeutic at 46.8 Enc exercise as tolerated dexa every 2 y         Genitourinary   Interstitial cystitis    Struggling Had to stop her elmiron due to vision side effects  Sees Dr Amalia Hailey She watches diet and fluids         Other   Vitamin D deficiency    Vitamin D level is therapeutic with current  supplementation Disc importance of this to bone and overall health Level 46.8  Also watched by Dr Garen Grams      Hyperlipidemia    .Disc goals for lipids and reasons to control them Rev last labs with pt Rev low sat fat diet in  detail  LDL is down  Enc her to keep working on diet       Relevant Medications   atenolol (TENORMIN) 100 MG tablet   Depressed mood    Overall stable on cymbalta Reviewed stressors/ coping techniques/symptoms/ support sources/ tx options and side effects in detail today Many stressors plus chronic pain       Autoimmune disease ([place])    Continues f/u with Dr Garen Grams and tx for chronic pain      Prediabetes    Lab Results  Component Value Date   HGBA1C 6.1 08/10/2018   Disc eliminating some sweets  disc imp of low glycemic diet and wt loss to prevent DM2           Follow Up Instructions: A1C is improved  Cholesterol is also improved   Stay as active as your body will let you be Get outdoors when you can  Stay in touch with Dr Amalia Hailey and Dr Garen Grams  Diet wise Try to get most of your carbohydrates from produce (with the exception of white potatoes)  Eat less bread/pasta/rice/snack foods/cereals/sweets and other items from the middle of the grocery store (processed carbs) Avoid red meat/ fried foods/ egg yolks/ fatty breakfast meats/ butter, cheese and high fat dairy/ and shellfish     I discussed the assessment and treatment plan with the patient. The patient was provided an opportunity to ask questions and all were answered. The patient agreed with the plan and demonstrated an understanding of the instructions.   The patient was advised to call back or seek an in-person evaluation if the symptoms worsen or if the condition fails to improve as anticipated.     [name], MD

## 2018-08-11 NOTE — Assessment & Plan Note (Signed)
Overall stable on cymbalta Reviewed stressors/ coping techniques/symptoms/ support sources/ tx options and side effects in detail today Many stressors plus chronic pain

## 2018-08-11 NOTE — Assessment & Plan Note (Signed)
Vitamin D level is therapeutic with current supplementation Disc importance of this to bone and overall health Level 46.8  Also watched by Dr Garen Grams

## 2018-08-11 NOTE — Assessment & Plan Note (Signed)
Struggling Had to stop her elmiron due to vision side effects  Sees Dr Amalia Hailey She watches diet and fluids

## 2018-08-11 NOTE — Assessment & Plan Note (Signed)
bp in fair control at this time  BP Readings from Last 1 Encounters:  01/30/18 122/84  pt does not check at home- this reading is from nov Has not felt high or low to her - usually stable on atenolol No changes needed Most recent labs reviewed  Disc lifstyle change with low sodium diet and exercise

## 2018-09-18 ENCOUNTER — Other Ambulatory Visit: Payer: Self-pay | Admitting: Rheumatology

## 2018-09-18 NOTE — Telephone Encounter (Signed)
Last Visit: 07/02/18 Next Visit: 12/31/18  Okay to refill per Dr. Estanislado Pandy

## 2018-09-25 DIAGNOSIS — H353132 Nonexudative age-related macular degeneration, bilateral, intermediate dry stage: Secondary | ICD-10-CM | POA: Diagnosis not present

## 2018-10-14 DIAGNOSIS — M791 Myalgia, unspecified site: Secondary | ICD-10-CM | POA: Diagnosis not present

## 2018-10-14 DIAGNOSIS — M542 Cervicalgia: Secondary | ICD-10-CM | POA: Diagnosis not present

## 2018-10-14 DIAGNOSIS — G518 Other disorders of facial nerve: Secondary | ICD-10-CM | POA: Diagnosis not present

## 2018-10-14 DIAGNOSIS — G43719 Chronic migraine without aura, intractable, without status migrainosus: Secondary | ICD-10-CM | POA: Diagnosis not present

## 2018-10-27 DIAGNOSIS — K582 Mixed irritable bowel syndrome: Secondary | ICD-10-CM | POA: Diagnosis not present

## 2018-10-27 DIAGNOSIS — N3010 Interstitial cystitis (chronic) without hematuria: Secondary | ICD-10-CM | POA: Diagnosis not present

## 2018-10-27 DIAGNOSIS — M797 Fibromyalgia: Secondary | ICD-10-CM | POA: Diagnosis not present

## 2018-12-17 NOTE — Progress Notes (Signed)
Office Visit Note  Patient: Lindsey [name]             Date of Birth: 25-Oct-1955           MRN: [id number]             PCP: Abner Greenspan, MD Referring: Tower, Wynelle Fanny, MD Visit Date: 12/31/2018 Occupation: @GUAROCC @  Subjective:  Pain in both hands and both knees   History of Present Illness: Lindsey [name] is a 63 y.o. female with history of osteoarthritis, fibromyalgia syndrome.  She states she continues to have increased pain and discomfort from fibromyalgia.  She states with the pandemic she has gained some weight as well.  She has been having some reflux symptoms.  She continues to have some discomfort in her hands.  She states recently she has been experiencing increased pain in her knee joints and difficulty climbing stairs.  Activities of Daily Living:  Patient reports morning stiffness for 60 minutes.   Patient Reports nocturnal pain.  Difficulty dressing/grooming: Denies Difficulty climbing stairs: Reports Difficulty getting out of chair: Reports Difficulty using hands for taps, buttons, cutlery, and/or writing: Reports  Review of Systems  Constitutional: Positive for fatigue and weight gain. Negative for night sweats and weight loss.  HENT: Negative for mouth sores, trouble swallowing, trouble swallowing, mouth dryness and nose dryness.   Eyes: Negative for pain, redness, visual disturbance and dryness.  Respiratory: Negative for cough, shortness of breath and difficulty breathing.   Cardiovascular: Negative for chest pain, palpitations, hypertension, irregular heartbeat and swelling in legs/feet.  Gastrointestinal: Positive for constipation and diarrhea. Negative for blood in stool.       H/o IBS  Endocrine: Negative for increased urination.  Genitourinary: Positive for nocturia and pelvic pain. Negative for vaginal dryness.  Musculoskeletal: Positive for arthralgias, joint pain, myalgias, morning stiffness and myalgias. Negative for joint swelling, muscle weakness and  muscle tenderness.  Skin: Negative for color change, rash, hair loss, skin tightness, ulcers and sensitivity to sunlight.  Allergic/Immunologic: Negative for susceptible to infections.  Neurological: Positive for dizziness. Negative for memory loss, night sweats and weakness.  Hematological: Negative for swollen glands.  Psychiatric/Behavioral: Positive for depressed mood and sleep disturbance. The patient is nervous/anxious.     PMFS History:  Patient Active Problem List   Diagnosis Date Noted  . Prediabetes 08/09/2018  . Estrogen deficiency 01/28/2018  . Autoimmune disease ([place]) 06/18/2017  . Rash and nonspecific skin eruption 09/04/2016  . History of recurrent cystitis ([place]) 08/16/2016  . Pelvic floor dysfunction 08/16/2016  . Dysuria 05/24/2016  . Macular degeneration 04/09/2016  . Osteoarthritis, knee 02/15/2016  . S/P laparoscopic cholecystectomy June 2017 09/26/2015  . Dermatitis 10/31/2014  . Encounter for Medicare annual wellness exam 09/04/2012  . HSV (herpes simplex virus) infection 04/08/2012  . IBS (irritable bowel syndrome) 10/21/2011  . Routine general medical examination at a health care facility 07/02/2011  . Osteopenia 05/17/2009  . Depressed mood 10/05/2008  . GERD 05/05/2008  . OBSTRUCTIVE SLEEP APNEA 06/25/2007  . Hyperlipidemia 05/31/2007  . Essential hypertension 05/31/2007  . Other fatigue 04/23/2007  . Vitamin D deficiency 01/22/2007  . Migraine headache 01/22/2007  . Interstitial cystitis 01/22/2007  . MENOPAUSE-RELATED VASOMOTOR SYMPTOMS 01/22/2007  . OSTEOARTHROSIS, GENERALIZED, MULTIPLE SITES 01/22/2007  . Fibromyalgia 01/22/2007  . PATENT FORAMEN OVALE 01/22/2007    Past Medical History:  Diagnosis Date  . Arthritis   . Cataract   . Chronic fatigue   . Early onset macular degeneration   .  Fibromyalgia   . Hemorrhoids   . Hyperglycemia 12/24/2013  . Hyperlipidemia   . Hypersomnia   . Hypertension   . IBS (irritable bowel syndrome)   .  Interstitial cystitis   . Migraines   . Osteopenia   . Pelvic floor dysfunction   . PFO (patent foramen ovale)   . Tendonitis    hand  . Vitamin D deficiency     Family History  Problem Relation Age of Onset  . Diabetes Father   . Alcohol abuse Father   . Heart disease Father   . Fibromyalgia Mother   . Colon cancer Paternal Uncle   . Stomach cancer Paternal Grandfather   . Diabetes Paternal Grandfather   . Diabetes Paternal Grandmother   . Stroke Paternal Grandmother   . Breast cancer Neg Hx    Past Surgical History:  Procedure Laterality Date  . ANKLE ARTHROSCOPY Right   . ANKLE FRACTURE SURGERY Left   . BLADDER SURGERY     and hydrodistension  . BREAST EXCISIONAL BIOPSY Bilateral    Multiple Benign Lumps removed    . BREAST LUMPECTOMY     many, bilateral  . CERVICAL DISCECTOMY  2006   fusion and plating  . CHOLECYSTECTOMY N/A 09/26/2015   Procedure: LAPAROSCOPIC CHOLECYSTECTOMY WITH INTRAOPERATIVE CHOLANGIOGRAM;  Surgeon: Johnathan Hausen, MD;  Location: [facility] ORS;  Service: General;  Laterality: N/A;  . COLONOSCOPY    . FRACTURE SURGERY     ankle fracture x 2  . NASAL SINUS SURGERY     multiple, x4  . UPPER GASTROINTESTINAL ENDOSCOPY    . VAGINAL HYSTERECTOMY  1990   because of the IC partial   Social History   Social History Narrative  . Not on file   Immunization History  Administered Date(s) Administered  . Influenza Inj Mdck Quad Pf 12/16/2017  . Influenza Whole 12/31/2006  . Influenza,inj,Quad PF,6+ Mos 12/24/2013, 02/20/2016, 10/30/2016, 11/30/2016  . Pneumococcal Polysaccharide-23 09/04/2012  . Td 04/23/2007  . Tdap 06/17/2017     Objective: Vital Signs: BP 134/84 (BP Location: Left Arm, Patient Position: Sitting, Cuff Size: Small)   Pulse 65   Resp 12   Ht 5\' 3"  (1.6 m)   Wt 181 lb 9.6 oz (82.4 kg)   BMI 32.17 kg/m    Physical Exam Vitals signs and nursing note reviewed.  Constitutional:      Appearance: She is well-developed.  HENT:      Head: Normocephalic and atraumatic.  Eyes:     Conjunctiva/sclera: Conjunctivae normal.  Neck:     Musculoskeletal: Normal range of motion.  Cardiovascular:     Rate and Rhythm: Normal rate and regular rhythm.     Heart sounds: Normal heart sounds.  Pulmonary:     Effort: Pulmonary effort is normal.     Breath sounds: Normal breath sounds.  Abdominal:     General: Bowel sounds are normal.     Palpations: Abdomen is soft.  Lymphadenopathy:     Cervical: No cervical adenopathy.  Skin:    General: Skin is warm and dry.     Capillary Refill: Capillary refill takes less than 2 seconds.  Neurological:     Mental Status: She is alert and oriented to person, place, and time.  Psychiatric:        Behavior: Behavior normal.      Musculoskeletal Exam: C-spine was in good range of motion with some stiffness.  Shoulder joints and elbow joints with good range of motion.  She has  good range of motion of her bilateral wrist joints MCPs PIPs DIPs with no swelling.  Hip joints, knee joints, ankles were in good range of motion with no synovitis.  She had discomfort range of motion of her knee joints.  CDAI Exam: CDAI Score: - Patient Global: -; Provider Global: - Swollen: -; Tender: - Joint Exam   No joint exam has been documented for this visit   There is currently no information documented on the homunculus. Go to the Rheumatology activity and complete the homunculus joint exam.  Investigation: No additional findings.  Imaging: No results found.  Recent Labs: Lab Results  Component Value Date   WBC 6.1 08/10/2018   HGB 14.4 08/10/2018   PLT 176.0 08/10/2018   NA 137 08/10/2018   K 4.8 08/10/2018   CL 100 08/10/2018   CO2 30 08/10/2018   GLUCOSE 110 (H) 08/10/2018   BUN 10 08/10/2018   CREATININE 0.70 08/10/2018   BILITOT 0.5 08/10/2018   ALKPHOS 74 08/10/2018   AST 17 08/10/2018   ALT 21 08/10/2018   PROT 7.0 08/10/2018   ALBUMIN 4.0 08/10/2018   CALCIUM 9.7 08/10/2018    GFRAA 98 04/23/2007    Speciality Comments: No specialty comments available.  Procedures:  No procedures performed Allergies: Buspirone hcl, Simvastatin, and Sulfa antibiotics   Assessment / Plan:     Visit Diagnoses: Fibromyalgia -he has been having a flare of fibromyalgia with increased pain and discomfort.  She is on cymbalta 60 mg p.o. daily.  She has gained some weight with the pandemic which probably is contributing to her symptoms.  Primary insomnia-despite of the medication she has ongoing insomnia.  Other fatigue-related to insomnia.  Primary osteoarthritis of both hands-she has mild osteoarthritis on examination and has ongoing symptoms.  Chronic pain in both knees-she has been experiencing increased pain and discomfort in her knee joints and difficulty climbing stairs.  She had no warmth swelling or effusion in her knees.  X-ray of bilateral knee joints 3 views were obtained.  X-ray showed only moderate chondromalacia patella.  I have given her a handout on exercises.  Weight loss diet and regular exercise was also emphasized.  Osteopenia of multiple sites - DEXA is ordered by PCP in February 2020 showed T score of -1.7.  History of vitamin D deficiency-she is on vitamin D supplement.  Interstitial cystitis - She is followed by Dr. Amalia Hailey.  She continues to have discomfort from interstitial cystitis.  History of depression-she is on medications.  History of hypertension-blood pressure is stable.  History of hyperlipidemia  History of IBS-she continues to have chronic constipation.  S/P laparoscopic cholecystectomy June 2017  History of hyperglycemia  Orders: No orders of the defined types were placed in this encounter.  No orders of the defined types were placed in this encounter.   Face-to-face time spent with patient was 25 minutes. Greater than 50% of time was spent in counseling and coordination of care.  Follow-Up Instructions: No follow-ups on file.    Bo Merino, MD  Note - This record has been created using Editor, commissioning.  Chart creation errors have been sought, but may not always  have been located. Such creation errors do not reflect on  the standard of medical care.

## 2018-12-31 ENCOUNTER — Ambulatory Visit: Payer: Self-pay

## 2018-12-31 ENCOUNTER — Other Ambulatory Visit: Payer: Self-pay

## 2018-12-31 ENCOUNTER — Ambulatory Visit (INDEPENDENT_AMBULATORY_CARE_PROVIDER_SITE_OTHER): Payer: Medicare Other | Admitting: Rheumatology

## 2018-12-31 ENCOUNTER — Encounter: Payer: Self-pay | Admitting: Rheumatology

## 2018-12-31 VITALS — BP 134/84 | HR 65 | Resp 12 | Ht 63.0 in | Wt 181.6 lb

## 2018-12-31 DIAGNOSIS — F5101 Primary insomnia: Secondary | ICD-10-CM

## 2018-12-31 DIAGNOSIS — R5383 Other fatigue: Secondary | ICD-10-CM

## 2018-12-31 DIAGNOSIS — Z8679 Personal history of other diseases of the circulatory system: Secondary | ICD-10-CM

## 2018-12-31 DIAGNOSIS — Z8659 Personal history of other mental and behavioral disorders: Secondary | ICD-10-CM

## 2018-12-31 DIAGNOSIS — M25562 Pain in left knee: Secondary | ICD-10-CM

## 2018-12-31 DIAGNOSIS — M797 Fibromyalgia: Secondary | ICD-10-CM | POA: Diagnosis not present

## 2018-12-31 DIAGNOSIS — M19041 Primary osteoarthritis, right hand: Secondary | ICD-10-CM

## 2018-12-31 DIAGNOSIS — Z8719 Personal history of other diseases of the digestive system: Secondary | ICD-10-CM

## 2018-12-31 DIAGNOSIS — M25561 Pain in right knee: Secondary | ICD-10-CM

## 2018-12-31 DIAGNOSIS — Z8639 Personal history of other endocrine, nutritional and metabolic disease: Secondary | ICD-10-CM

## 2018-12-31 DIAGNOSIS — M19042 Primary osteoarthritis, left hand: Secondary | ICD-10-CM

## 2018-12-31 DIAGNOSIS — G8929 Other chronic pain: Secondary | ICD-10-CM

## 2018-12-31 DIAGNOSIS — M8589 Other specified disorders of bone density and structure, multiple sites: Secondary | ICD-10-CM

## 2018-12-31 DIAGNOSIS — Z9049 Acquired absence of other specified parts of digestive tract: Secondary | ICD-10-CM

## 2018-12-31 DIAGNOSIS — N3010 Interstitial cystitis (chronic) without hematuria: Secondary | ICD-10-CM

## 2018-12-31 NOTE — Patient Instructions (Signed)
Journal for Nurse Practitioners, 15(4), 263-267. Retrieved January 05, 2018 from http://clinicalkey.com/nursing">  Knee Exercises Ask your health care provider which exercises are safe for you. Do exercises exactly as told by your health care provider and adjust them as directed. It is normal to feel mild stretching, pulling, tightness, or discomfort as you do these exercises. Stop right away if you feel sudden pain or your pain gets worse. Do not begin these exercises until told by your health care provider. Stretching and range-of-motion exercises These exercises warm up your muscles and joints and improve the movement and flexibility of your knee. These exercises also help to relieve pain and swelling. Knee extension, prone 1. Lie on your abdomen (prone position) on a bed. 2. Place your left / right knee just beyond the edge of the surface so your knee is not on the bed. You can put a towel under your left / right thigh just above your kneecap for comfort. 3. Relax your leg muscles and allow gravity to straighten your knee (extension). You should feel a stretch behind your left / right knee. 4. Hold this position for __________ seconds. 5. Scoot up so your knee is supported between repetitions. Repeat __________ times. Complete this exercise __________ times a day. Knee flexion, active  1. Lie on your back with both legs straight. If this causes back discomfort, bend your left / right knee so your foot is flat on the floor. 2. Slowly slide your left / right heel back toward your buttocks. Stop when you feel a gentle stretch in the front of your knee or thigh (flexion). 3. Hold this position for __________ seconds. 4. Slowly slide your left / right heel back to the starting position. Repeat __________ times. Complete this exercise __________ times a day. Quadriceps stretch, prone  1. Lie on your abdomen on a firm surface, such as a bed or padded floor. 2. Bend your left / right knee and hold  your ankle. If you cannot reach your ankle or pant leg, loop a belt around your foot and grab the belt instead. 3. Gently pull your heel toward your buttocks. Your knee should not slide out to the side. You should feel a stretch in the front of your thigh and knee (quadriceps). 4. Hold this position for __________ seconds. Repeat __________ times. Complete this exercise __________ times a day. Hamstring, supine 1. Lie on your back (supine position). 2. Loop a belt or towel over the ball of your left / right foot. The ball of your foot is on the walking surface, right under your toes. 3. Straighten your left / right knee and slowly pull on the belt to raise your leg until you feel a gentle stretch behind your knee (hamstring). ? Do not let your knee bend while you do this. ? Keep your other leg flat on the floor. 4. Hold this position for __________ seconds. Repeat __________ times. Complete this exercise __________ times a day. Strengthening exercises These exercises build strength and endurance in your knee. Endurance is the ability to use your muscles for a long time, even after they get tired. Quadriceps, isometric This exercise stretches the muscles in front of your thigh (quadriceps) without moving your knee joint (isometric). 1. Lie on your back with your left / right leg extended and your other knee bent. Put a rolled towel or small pillow under your knee if told by your health care provider. 2. Slowly tense the muscles in the front of your left /   right thigh. You should see your kneecap slide up toward your hip or see increased dimpling just above the knee. This motion will push the back of the knee toward the floor. 3. For __________ seconds, hold the muscle as tight as you can without increasing your pain. 4. Relax the muscles slowly and completely. Repeat __________ times. Complete this exercise __________ times a day. Straight leg raises This exercise stretches the muscles in front  of your thigh (quadriceps) and the muscles that move your hips (hip flexors). 1. Lie on your back with your left / right leg extended and your other knee bent. 2. Tense the muscles in the front of your left / right thigh. You should see your kneecap slide up or see increased dimpling just above the knee. Your thigh may even shake a bit. 3. Keep these muscles tight as you raise your leg 4-6 inches (10-15 cm) off the floor. Do not let your knee bend. 4. Hold this position for __________ seconds. 5. Keep these muscles tense as you lower your leg. 6. Relax your muscles slowly and completely after each repetition. Repeat __________ times. Complete this exercise __________ times a day. Hamstring, isometric 1. Lie on your back on a firm surface. 2. Bend your left / right knee about __________ degrees. 3. Dig your left / right heel into the surface as if you are trying to pull it toward your buttocks. Tighten the muscles in the back of your thighs (hamstring) to "dig" as hard as you can without increasing any pain. 4. Hold this position for __________ seconds. 5. Release the tension gradually and allow your muscles to relax completely for __________ seconds after each repetition. Repeat __________ times. Complete this exercise __________ times a day. Hamstring curls If told by your health care provider, do this exercise while wearing ankle weights. Begin with __________ lb weights. Then increase the weight by 1 lb (0.5 kg) increments. Do not wear ankle weights that are more than __________ lb. 1. Lie on your abdomen with your legs straight. 2. Bend your left / right knee as far as you can without feeling pain. Keep your hips flat against the floor. 3. Hold this position for __________ seconds. 4. Slowly lower your leg to the starting position. Repeat __________ times. Complete this exercise __________ times a day. Squats This exercise strengthens the muscles in front of your thigh and knee  (quadriceps). 1. Stand in front of a table, with your feet and knees pointing straight ahead. You may rest your hands on the table for balance but not for support. 2. Slowly bend your knees and lower your hips like you are going to sit in a chair. ? Keep your weight over your heels, not over your toes. ? Keep your lower legs upright so they are parallel with the table legs. ? Do not let your hips go lower than your knees. ? Do not bend lower than told by your health care provider. ? If your knee pain increases, do not bend as low. 3. Hold the squat position for __________ seconds. 4. Slowly push with your legs to return to standing. Do not use your hands to pull yourself to standing. Repeat __________ times. Complete this exercise __________ times a day. Wall slides This exercise strengthens the muscles in front of your thigh and knee (quadriceps). 1. Lean your back against a smooth wall or door, and walk your feet out 18-24 inches (46-61 cm) from it. 2. Place your feet hip-width apart. 3.   Slowly slide down the wall or door until your knees bend __________ degrees. Keep your knees over your heels, not over your toes. Keep your knees in line with your hips. 4. Hold this position for __________ seconds. Repeat __________ times. Complete this exercise __________ times a day. Straight leg raises This exercise strengthens the muscles that rotate the leg at the hip and move it away from your body (hip abductors). 1. Lie on your side with your left / right leg in the top position. Lie so your head, shoulder, knee, and hip line up. You may bend your bottom knee to help you keep your balance. 2. Roll your hips slightly forward so your hips are stacked directly over each other and your left / right knee is facing forward. 3. Leading with your heel, lift your top leg 4-6 inches (10-15 cm). You should feel the muscles in your outer hip lifting. ? Do not let your foot drift forward. ? Do not let your knee  roll toward the ceiling. 4. Hold this position for __________ seconds. 5. Slowly return your leg to the starting position. 6. Let your muscles relax completely after each repetition. Repeat __________ times. Complete this exercise __________ times a day. Straight leg raises This exercise stretches the muscles that move your hips away from the front of the pelvis (hip extensors). 1. Lie on your abdomen on a firm surface. You can put a pillow under your hips if that is more comfortable. 2. Tense the muscles in your buttocks and lift your left / right leg about 4-6 inches (10-15 cm). Keep your knee straight as you lift your leg. 3. Hold this position for __________ seconds. 4. Slowly lower your leg to the starting position. 5. Let your leg relax completely after each repetition. Repeat __________ times. Complete this exercise __________ times a day. This information is not intended to replace advice given to you by your health care provider. Make sure you discuss any questions you have with your health care provider. Document Released: 01/30/2005 Document Revised: 01/06/2018 Document Reviewed: 01/06/2018 [employer]vier Patient Education  2020 [employer]  

## 2019-01-05 ENCOUNTER — Other Ambulatory Visit: Payer: Self-pay | Admitting: Physician Assistant

## 2019-01-05 NOTE — Telephone Encounter (Signed)
Last Visit: 12/31/18 Next Visit: 07/01/19  Okay to refill per Dr. Estanislado Pandy

## 2019-01-13 DIAGNOSIS — G43719 Chronic migraine without aura, intractable, without status migrainosus: Secondary | ICD-10-CM | POA: Diagnosis not present

## 2019-01-13 DIAGNOSIS — M791 Myalgia, unspecified site: Secondary | ICD-10-CM | POA: Diagnosis not present

## 2019-01-13 DIAGNOSIS — M542 Cervicalgia: Secondary | ICD-10-CM | POA: Diagnosis not present

## 2019-01-13 DIAGNOSIS — G518 Other disorders of facial nerve: Secondary | ICD-10-CM | POA: Diagnosis not present

## 2019-01-28 DIAGNOSIS — R350 Frequency of micturition: Secondary | ICD-10-CM | POA: Diagnosis not present

## 2019-01-28 DIAGNOSIS — N3010 Interstitial cystitis (chronic) without hematuria: Secondary | ICD-10-CM | POA: Diagnosis not present

## 2019-01-28 DIAGNOSIS — R3915 Urgency of urination: Secondary | ICD-10-CM | POA: Diagnosis not present

## 2019-01-28 DIAGNOSIS — G8929 Other chronic pain: Secondary | ICD-10-CM | POA: Diagnosis not present

## 2019-03-10 ENCOUNTER — Other Ambulatory Visit: Payer: Self-pay

## 2019-03-10 ENCOUNTER — Encounter: Payer: Self-pay | Admitting: Family Medicine

## 2019-03-10 ENCOUNTER — Ambulatory Visit (INDEPENDENT_AMBULATORY_CARE_PROVIDER_SITE_OTHER): Payer: Medicare Other | Admitting: Family Medicine

## 2019-03-10 VITALS — BP 118/74 | HR 65 | Temp 97.0°F | Ht 63.0 in | Wt 182.6 lb

## 2019-03-10 DIAGNOSIS — R300 Dysuria: Secondary | ICD-10-CM

## 2019-03-10 DIAGNOSIS — N3010 Interstitial cystitis (chronic) without hematuria: Secondary | ICD-10-CM | POA: Diagnosis not present

## 2019-03-10 DIAGNOSIS — N3000 Acute cystitis without hematuria: Secondary | ICD-10-CM | POA: Diagnosis not present

## 2019-03-10 LAB — POC URINALSYSI DIPSTICK (AUTOMATED)
Bilirubin, UA: NEGATIVE
Blood, UA: 25
Glucose, UA: NEGATIVE
Ketones, UA: NEGATIVE
Nitrite, UA: POSITIVE
Protein, UA: POSITIVE — AB
Spec Grav, UA: 1.015 (ref 1.010–1.025)
Urobilinogen, UA: 0.2 E.U./dL
pH, UA: 6 (ref 5.0–8.0)

## 2019-03-10 MED ORDER — CEPHALEXIN 500 MG PO CAPS: 500.0000 mg | ORAL_CAPSULE | Freq: Three times a day (TID) | ORAL | 0 refills | Status: DC

## 2019-03-10 NOTE — Progress Notes (Signed)
Subjective:    Patient ID: Lindsey [name], female    DOB: 1956-02-28, 63 y.o.   MRN: [id number] This visit occurred during the SARS-CoV-2 public health emergency.  Safety protocols were in place, including screening questions prior to the visit, additional usage of staff PPE, and extensive cleaning of exam room while observing appropriate contact time as indicated for disinfecting solutions.    HPI Pt presents with urinary symptoms   Wt Readings from Last 3 Encounters:  03/10/19 182 lb 9 oz (82.8 kg)  12/31/18 181 lb 9.6 oz (82.4 kg)  08/11/18 175 lb (79.4 kg)   32.34 kg/m   H/o IC and utis as well as pelvic floor dysfunction   Pt states her symptoms started 2 weeks ago - slowly  Urine has looked cloudy and terrible  Has been drinking a lot of water   Dysuria (up and down) worse than usual  Has not seen blood  Darker today  Odor  Frequency and urgency    Results for orders placed or performed in visit on 03/10/19  POCT Urinalysis Dipstick (Automated)  Result Value Ref Range   Color, UA Yellow    Clarity, UA Cloudy    Glucose, UA Negative Negative   Bilirubin, UA Negative    Ketones, UA Negative    Spec Grav, UA 1.015 1.010 - 1.025   Blood, UA 25 Ery/uL    pH, UA 6.0 5.0 - 8.0   Protein, UA Positive (A) Negative   Urobilinogen, UA 0.2 0.2 or 1.0 E.U./dL   Nitrite, UA Positive    Leukocytes, UA Large (3+) (A) Negative    Positive   Some nausea  No vomiting  Sore back -nothing focal  No fever    Patient Active Problem List   Diagnosis Date Noted  . Acute cystitis 03/10/2019  . Prediabetes 08/09/2018  . Estrogen deficiency 01/28/2018  . Autoimmune disease ([place]) 06/18/2017  . Rash and nonspecific skin eruption 09/04/2016  . History of recurrent cystitis (IC) 08/16/2016  . Pelvic floor dysfunction 08/16/2016  . Dysuria 05/24/2016  . Macular degeneration 04/09/2016  . Osteoarthritis, knee 02/15/2016  . S/P laparoscopic cholecystectomy June 2017 09/26/2015  .  Dermatitis 10/31/2014  . Encounter for Medicare annual wellness exam 09/04/2012  . HSV (herpes simplex virus) infection 04/08/2012  . IBS (irritable bowel syndrome) 10/21/2011  . Routine general medical examination at a health care facility 07/02/2011  . Osteopenia 05/17/2009  . Depressed mood 10/05/2008  . GERD 05/05/2008  . OBSTRUCTIVE SLEEP APNEA 06/25/2007  . Hyperlipidemia 05/31/2007  . Essential hypertension 05/31/2007  . Other fatigue 04/23/2007  . Vitamin D deficiency 01/22/2007  . Migraine headache 01/22/2007  . Interstitial cystitis 01/22/2007  . MENOPAUSE-RELATED VASOMOTOR SYMPTOMS 01/22/2007  . OSTEOARTHROSIS, GENERALIZED, MULTIPLE SITES 01/22/2007  . Fibromyalgia 01/22/2007  . PATENT FORAMEN OVALE 01/22/2007   Past Medical History:  Diagnosis Date  . Arthritis   . Cataract   . Chronic fatigue   . Early onset macular degeneration   . Fibromyalgia   . Hemorrhoids   . Hyperglycemia 12/24/2013  . Hyperlipidemia   . Hypersomnia   . Hypertension   . IBS (irritable bowel syndrome)   . Interstitial cystitis   . Migraines   . Osteopenia   . Pelvic floor dysfunction   . PFO (patent foramen ovale)   . Tendonitis    hand  . Vitamin D deficiency    Past Surgical History:  Procedure Laterality Date  . ANKLE ARTHROSCOPY Right   .  ANKLE FRACTURE SURGERY Left   . BLADDER SURGERY     and hydrodistension  . BREAST EXCISIONAL BIOPSY Bilateral    Multiple Benign Lumps removed    . BREAST LUMPECTOMY     many, bilateral  . CERVICAL DISCECTOMY  2006   fusion and plating  . CHOLECYSTECTOMY N/A 09/26/2015   Procedure: LAPAROSCOPIC CHOLECYSTECTOMY WITH INTRAOPERATIVE CHOLANGIOGRAM;  Surgeon: Johnathan Hausen, MD;  Location: [facility] ORS;  Service: General;  Laterality: N/A;  . COLONOSCOPY    . FRACTURE SURGERY     ankle fracture x 2  . NASAL SINUS SURGERY     multiple, x4  . UPPER GASTROINTESTINAL ENDOSCOPY    . VAGINAL HYSTERECTOMY  1990   because of the IC partial    Social History   Tobacco Use  . Smoking status: Never Smoker  . Smokeless tobacco: Never Used  Substance Use Topics  . Alcohol use: No    Alcohol/week: 0.0 standard drinks  . Drug use: No   Family History  Problem Relation Age of Onset  . Diabetes Father   . Alcohol abuse Father   . Heart disease Father   . Fibromyalgia Mother   . Colon cancer Paternal Uncle   . Stomach cancer Paternal Grandfather   . Diabetes Paternal Grandfather   . Diabetes Paternal Grandmother   . Stroke Paternal Grandmother   . Breast cancer Neg Hx    Allergies  Allergen Reactions  . Buspirone Hcl     REACTION: dystonia  . Simvastatin     REACTION: malaise  . Sulfa Antibiotics     nausea   Current Outpatient Medications on File Prior to Visit  Medication Sig Dispense Refill  . atenolol (TENORMIN) 100 MG tablet Take 1 tablet (100 mg total) by mouth daily. 90 tablet 3  . botulinum toxin Type A (BOTOX) 100 units SOLR injection Inject 200 Units into the muscle once.     . calcium carbonate (TUMS - DOSED IN MG ELEMENTAL CALCIUM) 500 MG chewable tablet Chew 1 tablet by mouth daily as needed for indigestion or heartburn.    . cholecalciferol (VITAMIN D) 1000 UNITS tablet Take 2,000 Units by mouth 2 (two) times daily.     Marland Kitchen DIAZEPAM RE Place 10 mg vaginally at bedtime.    . diclofenac sodium (VOLTAREN) 1 % GEL Apply 2 to 4 gram to affected joints up to 4 times daily 400 g 2  . DULoxetine (CYMBALTA) 60 MG capsule TAKE 1 CAPSULE BY MOUTH EVERY DAY 90 capsule 1  . famotidine (PEPCID) 20 MG tablet Take 20 mg by mouth 2 (two) times daily as needed for heartburn or indigestion.    . fentaNYL (DURAGESIC - DOSED MCG/HR) 50 MCG/HR Place 50 mcg onto the skin every other day.     . hydrOXYzine (ATARAX/VISTARIL) 25 MG tablet     . lidocaine (XYLOCAINE) 5 % ointment APPLY TO AFFECTED AREA EVERY DAY AS DIRECTED    . LUTEIN PO Take 1 tablet by mouth daily.    . nitrofurantoin (MACRODANTIN) 50 MG capsule Take 50 mg by  mouth daily.     No current facility-administered medications on file prior to visit.     Review of Systems  Constitutional: Positive for fatigue. Negative for activity change, appetite change, fever and unexpected weight change.  HENT: Negative for congestion, ear pain, rhinorrhea, sinus pressure and sore throat.   Eyes: Negative for pain, redness and visual disturbance.  Respiratory: Negative for cough, shortness of breath and wheezing.  Cardiovascular: Negative for chest pain and palpitations.  Gastrointestinal: Negative for abdominal pain, blood in stool, constipation and diarrhea.  Endocrine: Negative for polydipsia and polyuria.  Genitourinary: Positive for dysuria, frequency, pelvic pain and urgency. Negative for decreased urine volume, hematuria and vaginal discharge.  Musculoskeletal: Negative for arthralgias, back pain and myalgias.  Skin: Negative for pallor and rash.  Allergic/Immunologic: Negative for environmental allergies.  Neurological: Negative for dizziness, syncope and headaches.  Hematological: Negative for adenopathy. Does not bruise/bleed easily.  Psychiatric/Behavioral: Negative for decreased concentration and dysphoric mood. The patient is not nervous/anxious.        Objective:   Physical Exam Constitutional:      General: She is not in acute distress.    Appearance: Normal appearance. She is well-developed. She is obese. She is not ill-appearing.  HENT:     Head: Normocephalic and atraumatic.  Eyes:     Conjunctiva/sclera: Conjunctivae normal.     Pupils: Pupils are equal, round, and reactive to light.  Neck:     Musculoskeletal: Normal range of motion and neck supple.  Cardiovascular:     Rate and Rhythm: Normal rate and regular rhythm.     Heart sounds: Normal heart sounds.  Pulmonary:     Effort: Pulmonary effort is normal.     Breath sounds: Normal breath sounds.  Abdominal:     General: Bowel sounds are normal. There is no distension.      Palpations: Abdomen is soft.     Tenderness: There is abdominal tenderness. There is no left CVA tenderness or rebound.     Comments: Slight R cva tenderness-per pt this may be her baseline   Mild suprapubic tenderness   Lymphadenopathy:     Cervical: No cervical adenopathy.  Skin:    Findings: No rash.  Neurological:     Mental Status: She is alert.  Psychiatric:        Mood and Affect: Mood normal.           Assessment & Plan:   Problem List Items Addressed This Visit      Genitourinary   Interstitial cystitis    Symptoms are worse today  ua pos-will tx for uti       Acute cystitis - Primary    Pos ua with worse voiding symptoms (from her baseline IC) Keflex sent in  Enc fluids  inst to call/seek care if symptoms suddenly worsen  Urine cx pending       Relevant Orders   Urine Culture     Other   Dysuria   Relevant Orders   POCT Urinalysis Dipstick (Automated) (Completed)

## 2019-03-10 NOTE — Assessment & Plan Note (Signed)
Symptoms are worse today  ua pos-will tx for uti

## 2019-03-10 NOTE — Assessment & Plan Note (Signed)
Pos ua with worse voiding symptoms (from her baseline IC) Keflex sent in  Enc fluids  inst to call/seek care if symptoms suddenly worsen  Urine cx pending

## 2019-03-10 NOTE — Patient Instructions (Addendum)
Start keflex as directed  Drink lots of water  If symptoms suddenly change or worsen let us know   We will call when culture returns

## 2019-03-12 LAB — URINE CULTURE
MICRO NUMBER:: 1180642
SPECIMEN QUALITY:: ADEQUATE

## 2019-03-19 ENCOUNTER — Other Ambulatory Visit: Payer: Self-pay | Admitting: Rheumatology

## 2019-03-19 NOTE — Telephone Encounter (Signed)
Last Visit: 12/31/2018 Next Visit: 07/01/2019  Okay to refill per Dr. Estanislado Pandy.

## 2019-03-29 DIAGNOSIS — H353132 Nonexudative age-related macular degeneration, bilateral, intermediate dry stage: Secondary | ICD-10-CM | POA: Diagnosis not present

## 2019-04-05 DIAGNOSIS — H353132 Nonexudative age-related macular degeneration, bilateral, intermediate dry stage: Secondary | ICD-10-CM | POA: Diagnosis not present

## 2019-04-22 DIAGNOSIS — G8929 Other chronic pain: Secondary | ICD-10-CM | POA: Diagnosis not present

## 2019-04-22 DIAGNOSIS — G43719 Chronic migraine without aura, intractable, without status migrainosus: Secondary | ICD-10-CM | POA: Diagnosis not present

## 2019-04-22 DIAGNOSIS — G518 Other disorders of facial nerve: Secondary | ICD-10-CM | POA: Diagnosis not present

## 2019-04-22 DIAGNOSIS — M791 Myalgia, unspecified site: Secondary | ICD-10-CM | POA: Diagnosis not present

## 2019-04-22 DIAGNOSIS — M542 Cervicalgia: Secondary | ICD-10-CM | POA: Diagnosis not present

## 2019-05-04 DIAGNOSIS — R3915 Urgency of urination: Secondary | ICD-10-CM | POA: Diagnosis not present

## 2019-05-04 DIAGNOSIS — R350 Frequency of micturition: Secondary | ICD-10-CM | POA: Diagnosis not present

## 2019-05-04 DIAGNOSIS — N3010 Interstitial cystitis (chronic) without hematuria: Secondary | ICD-10-CM | POA: Diagnosis not present

## 2019-05-04 DIAGNOSIS — K582 Mixed irritable bowel syndrome: Secondary | ICD-10-CM | POA: Diagnosis not present

## 2019-05-22 ENCOUNTER — Other Ambulatory Visit: Payer: Self-pay | Admitting: Family Medicine

## 2019-06-14 ENCOUNTER — Other Ambulatory Visit: Payer: Self-pay | Admitting: Rheumatology

## 2019-06-14 NOTE — Telephone Encounter (Signed)
Last Visit: 12/31/2018 Next Visit: 07/01/2019  Okay to refill per Dr. Estanislado Pandy.

## 2019-06-18 ENCOUNTER — Ambulatory Visit: Payer: Medicare Other | Attending: Internal Medicine

## 2019-06-18 DIAGNOSIS — Z23 Encounter for immunization: Secondary | ICD-10-CM

## 2019-06-18 NOTE — Progress Notes (Signed)
   Covid-19 Vaccination Clinic  Name:  Lindsey [name]    MRN: [id number] DOB: 06-04-1955  06/18/2019  [name] was observed post Covid-19 immunization for 15 minutes without incident. She was provided with Vaccine Information Sheet and instruction to access the V-Safe system.   [name] was instructed to call 911 with any severe reactions post vaccine: Marland Kitchen Difficulty breathing  . Swelling of face and throat  . A fast heartbeat  . A bad rash all over body  . Dizziness and weakness   Immunizations Administered    Name Date Dose VIS Date Route   Pfizer COVID-19 Vaccine 06/18/2019  2:49 PM 0.3 mL 03/12/2019 Intramuscular   Manufacturer: [employer]   Lot: [id number]   [place]: [id number]

## 2019-06-28 NOTE — Progress Notes (Signed)
Office Visit Note  Patient: Lindsey [name]             Date of Birth: 17-Nov-1955           MRN: [id number]             PCP: Abner Greenspan, MD Referring: Tower, Wynelle Fanny, MD Visit Date: 07/01/2019 Occupation: @GUAROCC @  Subjective:  Generalized pain   History of Present Illness: Lindsey [name] is a 64 y.o. female with history of fibromyalgia and osteoarthritis.  She reports she is having worsening myalgias and arthralgias in bilateral lower extremities.  She is having difficulty rising from a seated position and walking prolonged distances due to the discomfort.  She has also been having increased pain and stiffness in both hands.  She denies any joint swelling.  She continues to have chronic fatigue secondary to insomnia.  She has interrupted sleep at night due to nocturnal pain.  She has been unable to exercise due to the discomfort she has been experiencing.     Activities of Daily Living:  Patient reports morning stiffness for 2   hours.   Patient Reports nocturnal pain.  Difficulty dressing/grooming: Denies Difficulty climbing stairs: Reports Difficulty getting out of chair: Reports Difficulty using hands for taps, buttons, cutlery, and/or writing: Denies  Review of Systems  Constitutional: Positive for fatigue.  HENT: Positive for mouth dryness. Negative for mouth sores and nose dryness.   Eyes: Positive for dryness. Negative for pain and visual disturbance.  Respiratory: Negative for cough, hemoptysis, shortness of breath and difficulty breathing.   Cardiovascular: Negative for chest pain, palpitations, hypertension and swelling in legs/feet.  Gastrointestinal: Positive for constipation. Negative for blood in stool and diarrhea.  Endocrine: Negative for increased urination.  Genitourinary: Negative for painful urination.  Musculoskeletal: Positive for arthralgias, joint pain, myalgias, morning stiffness, muscle tenderness and myalgias. Negative for joint swelling and muscle  weakness.  Skin: Negative for color change, pallor, rash, hair loss, nodules/bumps, skin tightness, ulcers and sensitivity to sunlight.  Allergic/Immunologic: Negative for susceptible to infections.  Neurological: Negative for dizziness, numbness, headaches and weakness.  Hematological: Negative for swollen glands.  Psychiatric/Behavioral: Positive for sleep disturbance. Negative for depressed mood. The patient is not nervous/anxious.     PMFS History:  Patient Active Problem List   Diagnosis Date Noted  . Acute cystitis 03/10/2019  . Prediabetes 08/09/2018  . Estrogen deficiency 01/28/2018  . Autoimmune disease ([place]) 06/18/2017  . Rash and nonspecific skin eruption 09/04/2016  . History of recurrent cystitis (IC) 08/16/2016  . Pelvic floor dysfunction 08/16/2016  . Dysuria 05/24/2016  . Macular degeneration 04/09/2016  . Osteoarthritis, knee 02/15/2016  . S/P laparoscopic cholecystectomy June 2017 09/26/2015  . Dermatitis 10/31/2014  . Encounter for Medicare annual wellness exam 09/04/2012  . HSV (herpes simplex virus) infection 04/08/2012  . IBS (irritable bowel syndrome) 10/21/2011  . Routine general medical examination at a health care facility 07/02/2011  . Osteopenia 05/17/2009  . Depressed mood 10/05/2008  . GERD 05/05/2008  . OBSTRUCTIVE SLEEP APNEA 06/25/2007  . Hyperlipidemia 05/31/2007  . Essential hypertension 05/31/2007  . Other fatigue 04/23/2007  . Vitamin D deficiency 01/22/2007  . Migraine headache 01/22/2007  . Interstitial cystitis 01/22/2007  . MENOPAUSE-RELATED VASOMOTOR SYMPTOMS 01/22/2007  . OSTEOARTHROSIS, GENERALIZED, MULTIPLE SITES 01/22/2007  . Fibromyalgia 01/22/2007  . PATENT FORAMEN OVALE 01/22/2007    Past Medical History:  Diagnosis Date  . Arthritis   . Cataract   . Chronic fatigue   .  Early onset macular degeneration   . Fibromyalgia   . Hemorrhoids   . Hyperglycemia 12/24/2013  . Hyperlipidemia   . Hypersomnia   . Hypertension     . IBS (irritable bowel syndrome)   . Interstitial cystitis   . Migraines   . Osteopenia   . Pelvic floor dysfunction   . PFO (patent foramen ovale)   . Tendonitis    hand  . Vitamin D deficiency     Family History  Problem Relation Age of Onset  . Diabetes Father   . Alcohol abuse Father   . Heart disease Father   . Fibromyalgia Mother   . Colon cancer Paternal Uncle   . Stomach cancer Paternal Grandfather   . Diabetes Paternal Grandfather   . Diabetes Paternal Grandmother   . Stroke Paternal Grandmother   . Breast cancer Neg Hx    Past Surgical History:  Procedure Laterality Date  . ANKLE ARTHROSCOPY Right   . ANKLE FRACTURE SURGERY Left   . BLADDER SURGERY     and hydrodistension  . BREAST EXCISIONAL BIOPSY Bilateral    Multiple Benign Lumps removed    . BREAST LUMPECTOMY     many, bilateral  . CERVICAL DISCECTOMY  2006   fusion and plating  . CHOLECYSTECTOMY N/A 09/26/2015   Procedure: LAPAROSCOPIC CHOLECYSTECTOMY WITH INTRAOPERATIVE CHOLANGIOGRAM;  Surgeon: Johnathan Hausen, MD;  Location: [facility] ORS;  Service: General;  Laterality: N/A;  . COLONOSCOPY    . FRACTURE SURGERY     ankle fracture x 2  . NASAL SINUS SURGERY     multiple, x4  . UPPER GASTROINTESTINAL ENDOSCOPY    . VAGINAL HYSTERECTOMY  1990   because of the IC partial   Social History   Social History Narrative  . Not on file   Immunization History  Administered Date(s) Administered  . Influenza Inj Mdck Quad Pf 12/16/2017  . Influenza Whole 12/31/2006  . Influenza,inj,Quad PF,6+ Mos 12/24/2013, 02/20/2016, 10/30/2016, 11/30/2016, 11/26/2018  . PFIZER SARS-COV-2 Vaccination 06/18/2019  . Pneumococcal Polysaccharide-23 09/04/2012  . Td 04/23/2007  . Tdap 06/17/2017     Objective: Vital Signs: There were no vitals taken for this visit.   Physical Exam Vitals and nursing note reviewed.  Constitutional:      Appearance: She is well-developed.  HENT:     Head: Normocephalic and atraumatic.   Eyes:     Conjunctiva/sclera: Conjunctivae normal.  Cardiovascular:     Rate and Rhythm: Normal rate.  Pulmonary:     Effort: Pulmonary effort is normal.  Abdominal:     General: Bowel sounds are normal.     Palpations: Abdomen is soft.  Musculoskeletal:     Cervical back: Normal range of motion.  Lymphadenopathy:     Cervical: No cervical adenopathy.  Skin:    General: Skin is warm and dry.     Capillary Refill: Capillary refill takes less than 2 seconds.  Neurological:     Mental Status: She is alert and oriented to person, place, and time.  Psychiatric:        Behavior: Behavior normal.      Musculoskeletal Exam: Generalized hyperalgesia and positive tender points.  C-spine, thoracic spine, and lumbar spine good ROM.  Shoulder joints, elbow joints, wrist joints, MCPs, PIPs, and DIPs good ROM with no synovitis.  Complete fist formation bilaterally.  Hip joints, knee joints, ankle joints, MTPs, PIPs, and DIPs good ROM with no synovitis.  No warmth or effusion of knee joints.  No tenderness or  swelling of ankle joints.    CDAI Exam: CDAI Score: -- Patient Global: --; Provider Global: -- Swollen: --; Tender: -- Joint Exam 07/01/2019   No joint exam has been documented for this visit   There is currently no information documented on the homunculus. Go to the Rheumatology activity and complete the homunculus joint exam.  Investigation: No additional findings.  Imaging: No results found.  Recent Labs: Lab Results  Component Value Date   WBC 6.1 08/10/2018   HGB 14.4 08/10/2018   PLT 176.0 08/10/2018   NA 137 08/10/2018   K 4.8 08/10/2018   CL 100 08/10/2018   CO2 30 08/10/2018   GLUCOSE 110 (H) 08/10/2018   BUN 10 08/10/2018   CREATININE 0.70 08/10/2018   BILITOT 0.5 08/10/2018   ALKPHOS 74 08/10/2018   AST 17 08/10/2018   ALT 21 08/10/2018   PROT 7.0 08/10/2018   ALBUMIN 4.0 08/10/2018   CALCIUM 9.7 08/10/2018   GFRAA 98 04/23/2007    Speciality  Comments: No specialty comments available.  Procedures:  No procedures performed Allergies: Buspirone hcl, Simvastatin, and Sulfa antibiotics   Assessment / Plan:     Visit Diagnoses: Fibromyalgia -She has generalized hyperalgesia and positive tender points on exam.  She has been experiencing more frequent and severe fibromyalgia flares recently.  She is having increased myalgias and muscle tenderness in bilateral lower extremities.  She is been having difficulty walking prolonged distances due to the discomfort she has been experiencing.  We discussed the importance of regular exercise and good sleep hygiene.  We discussed the benefits of water therapy.  She continues to take cymbalta 60 mg 1 capsule daily. A refill will be sent to pharmacy today.  She will follow-up in the office in 6 months  Other fatigue: She has chronic fatigue secondary to insomnia.  We discussed the importance of regular exercise.  Primary insomnia: She has interrupted sleep at night due to nocturnal pain.  Good sleep hygiene was discussed.  Primary osteoarthritis of both hands: She has PIP and DIP thickening consistent with osteoarthritis of both hands.  No tenderness or synovitis was noted.  She has complete fist formation bilaterally.  Joint protection and muscle strengthening were discussed.  She was given a handout of hand exercises to perform.  Primary osteoarthritis of both knees: X-rays of both knees were updated on 12/31/2018.  X-rays were consistent with moderate osteoarthritis.  She has good range of motion of both knee joints on exam.  No warmth or effusion was noted.  She has difficulty climbing steps and walking prolonged distances due to the discomfort.  She declined a cortisone injection today.  She was given a handout of knee joint exercises to perform.  We discussed the importance of lower extremity muscle strengthening.  We also discussed the importance of regular exercise and weight loss.  She can continue to  use Voltaren gel topically as needed for pain relief.  She does not need a refill at this time.  Osteopenia of multiple sites - DEXA is ordered by PCP in February 2020 showed T score of -1.7.  She is taking calcium and vitamin D supplement.  History of vitamin D deficiency: She is taking vitamin D supplement.  Other medical conditions are listed as follows:  History of depression  Interstitial cystitis - She is followed by Dr. Amalia Hailey.  History of hyperlipidemia  History of IBS  History of hypertension  History of hyperglycemia  S/P laparoscopic cholecystectomy June 2017  Orders: No  orders of the defined types were placed in this encounter.  No orders of the defined types were placed in this encounter.    Follow-Up Instructions: Return in about 6 months (around 12/31/2019) for Fibromyalgia, Osteoarthritis.   Ofilia Neas, PA-C   I examined and evaluated the patient with Hazel Sams PA.  Patient continues to have pain and discomfort in her hands and knee joints.  She has moderate osteoarthritis in her knee joints.  Weight loss diet and exercise was discussed at length.  I did not notice any synovitis on examination.  Handout on hand muscle  strengthening exercise was also given.  The plan of care was discussed as noted above.  Bo Merino, MD  Note - This record has been created using Editor, commissioning.  Chart creation errors have been sought, but may not always  have been located. Such creation errors do not reflect on  the standard of medical care.

## 2019-06-29 ENCOUNTER — Other Ambulatory Visit: Payer: Self-pay | Admitting: Family Medicine

## 2019-06-29 DIAGNOSIS — Z1231 Encounter for screening mammogram for malignant neoplasm of breast: Secondary | ICD-10-CM

## 2019-07-01 ENCOUNTER — Ambulatory Visit: Payer: Medicare Other | Admitting: Rheumatology

## 2019-07-01 ENCOUNTER — Encounter: Payer: Self-pay | Admitting: Rheumatology

## 2019-07-01 ENCOUNTER — Other Ambulatory Visit: Payer: Self-pay

## 2019-07-01 VITALS — BP 135/82 | HR 54 | Resp 17 | Ht 63.0 in | Wt 181.6 lb

## 2019-07-01 DIAGNOSIS — M19041 Primary osteoarthritis, right hand: Secondary | ICD-10-CM

## 2019-07-01 DIAGNOSIS — N3010 Interstitial cystitis (chronic) without hematuria: Secondary | ICD-10-CM

## 2019-07-01 DIAGNOSIS — Z8679 Personal history of other diseases of the circulatory system: Secondary | ICD-10-CM

## 2019-07-01 DIAGNOSIS — Z8659 Personal history of other mental and behavioral disorders: Secondary | ICD-10-CM

## 2019-07-01 DIAGNOSIS — M8589 Other specified disorders of bone density and structure, multiple sites: Secondary | ICD-10-CM | POA: Diagnosis not present

## 2019-07-01 DIAGNOSIS — R5383 Other fatigue: Secondary | ICD-10-CM | POA: Diagnosis not present

## 2019-07-01 DIAGNOSIS — M170 Bilateral primary osteoarthritis of knee: Secondary | ICD-10-CM

## 2019-07-01 DIAGNOSIS — Z8639 Personal history of other endocrine, nutritional and metabolic disease: Secondary | ICD-10-CM

## 2019-07-01 DIAGNOSIS — M19042 Primary osteoarthritis, left hand: Secondary | ICD-10-CM

## 2019-07-01 DIAGNOSIS — M797 Fibromyalgia: Secondary | ICD-10-CM

## 2019-07-01 DIAGNOSIS — Z9049 Acquired absence of other specified parts of digestive tract: Secondary | ICD-10-CM

## 2019-07-01 DIAGNOSIS — F5101 Primary insomnia: Secondary | ICD-10-CM

## 2019-07-01 DIAGNOSIS — Z8719 Personal history of other diseases of the digestive system: Secondary | ICD-10-CM

## 2019-07-01 NOTE — Patient Instructions (Addendum)
Journal for Nurse Practitioners, 15(4), 263-267. Retrieved January 05, 2018 from [email or web address]">  Knee Exercises Ask your health care provider which exercises are safe for you. Do exercises exactly as told by your health care provider and adjust them as directed. It is normal to feel mild stretching, pulling, tightness, or discomfort as you do these exercises. Stop right away if you feel sudden pain or your pain gets worse. Do not begin these exercises until told by your health care provider. Stretching and range-of-motion exercises These exercises warm up your muscles and joints and improve the movement and flexibility of your knee. These exercises also help to relieve pain and swelling. Knee extension, prone 1. Lie on your abdomen (prone position) on a bed. 2. Place your left / right knee just beyond the edge of the surface so your knee is not on the bed. You can put a towel under your left / right thigh just above your kneecap for comfort. 3. Relax your leg muscles and allow gravity to straighten your knee (extension). You should feel a stretch behind your left / right knee. 4. Hold this position for __________ seconds. 5. Scoot up so your knee is supported between repetitions. Repeat __________ times. Complete this exercise __________ times a day. Knee flexion, active  1. Lie on your back with both legs straight. If this causes back discomfort, bend your left / right knee so your foot is flat on the floor. 2. Slowly slide your left / right heel back toward your buttocks. Stop when you feel a gentle stretch in the front of your knee or thigh (flexion). 3. Hold this position for __________ seconds. 4. Slowly slide your left / right heel back to the starting position. Repeat __________ times. Complete this exercise __________ times a day. Quadriceps stretch, prone  1. Lie on your abdomen on a firm surface, such as a bed or padded floor. 2. Bend your left / right knee and hold  your ankle. If you cannot reach your ankle or pant leg, loop a belt around your foot and grab the belt instead. 3. Gently pull your heel toward your buttocks. Your knee should not slide out to the side. You should feel a stretch in the front of your thigh and knee (quadriceps). 4. Hold this position for __________ seconds. Repeat __________ times. Complete this exercise __________ times a day. Hamstring, supine 1. Lie on your back (supine position). 2. Loop a belt or towel over the ball of your left / right foot. The ball of your foot is on the walking surface, right under your toes. 3. Straighten your left / right knee and slowly pull on the belt to raise your leg until you feel a gentle stretch behind your knee (hamstring). ? Do not let your knee bend while you do this. ? Keep your other leg flat on the floor. 4. Hold this position for __________ seconds. Repeat __________ times. Complete this exercise __________ times a day. Strengthening exercises These exercises build strength and endurance in your knee. Endurance is the ability to use your muscles for a long time, even after they get tired. Quadriceps, isometric This exercise stretches the muscles in front of your thigh (quadriceps) without moving your knee joint (isometric). 1. Lie on your back with your left / right leg extended and your other knee bent. Put a rolled towel or small pillow under your knee if told by your health care provider. 2. Slowly tense the muscles in the front of your left /   right thigh. You should see your kneecap slide up toward your hip or see increased dimpling just above the knee. This motion will push the back of the knee toward the floor. 3. For __________ seconds, hold the muscle as tight as you can without increasing your pain. 4. Relax the muscles slowly and completely. Repeat __________ times. Complete this exercise __________ times a day. Straight leg raises This exercise stretches the muscles in front  of your thigh (quadriceps) and the muscles that move your hips (hip flexors). 1. Lie on your back with your left / right leg extended and your other knee bent. 2. Tense the muscles in the front of your left / right thigh. You should see your kneecap slide up or see increased dimpling just above the knee. Your thigh may even shake a bit. 3. Keep these muscles tight as you raise your leg 4-6 inches (10-15 cm) off the floor. Do not let your knee bend. 4. Hold this position for __________ seconds. 5. Keep these muscles tense as you lower your leg. 6. Relax your muscles slowly and completely after each repetition. Repeat __________ times. Complete this exercise __________ times a day. Hamstring, isometric 1. Lie on your back on a firm surface. 2. Bend your left / right knee about __________ degrees. 3. Dig your left / right heel into the surface as if you are trying to pull it toward your buttocks. Tighten the muscles in the back of your thighs (hamstring) to "dig" as hard as you can without increasing any pain. 4. Hold this position for __________ seconds. 5. Release the tension gradually and allow your muscles to relax completely for __________ seconds after each repetition. Repeat __________ times. Complete this exercise __________ times a day. Hamstring curls If told by your health care provider, do this exercise while wearing ankle weights. Begin with __________ lb weights. Then increase the weight by 1 lb (0.5 kg) increments. Do not wear ankle weights that are more than __________ lb. 1. Lie on your abdomen with your legs straight. 2. Bend your left / right knee as far as you can without feeling pain. Keep your hips flat against the floor. 3. Hold this position for __________ seconds. 4. Slowly lower your leg to the starting position. Repeat __________ times. Complete this exercise __________ times a day. Squats This exercise strengthens the muscles in front of your thigh and knee  (quadriceps). 1. Stand in front of a table, with your feet and knees pointing straight ahead. You may rest your hands on the table for balance but not for support. 2. Slowly bend your knees and lower your hips like you are going to sit in a chair. ? Keep your weight over your heels, not over your toes. ? Keep your lower legs upright so they are parallel with the table legs. ? Do not let your hips go lower than your knees. ? Do not bend lower than told by your health care provider. ? If your knee pain increases, do not bend as low. 3. Hold the squat position for __________ seconds. 4. Slowly push with your legs to return to standing. Do not use your hands to pull yourself to standing. Repeat __________ times. Complete this exercise __________ times a day. Wall slides This exercise strengthens the muscles in front of your thigh and knee (quadriceps). 1. Lean your back against a smooth wall or door, and walk your feet out 18-24 inches (46-61 cm) from it. 2. Place your feet hip-width apart. 3.   Slowly slide down the wall or door until your knees bend __________ degrees. Keep your knees over your heels, not over your toes. Keep your knees in line with your hips. 4. Hold this position for __________ seconds. Repeat __________ times. Complete this exercise __________ times a day. Straight leg raises This exercise strengthens the muscles that rotate the leg at the hip and move it away from your body (hip abductors). 1. Lie on your side with your left / right leg in the top position. Lie so your head, shoulder, knee, and hip line up. You may bend your bottom knee to help you keep your balance. 2. Roll your hips slightly forward so your hips are stacked directly over each other and your left / right knee is facing forward. 3. Leading with your heel, lift your top leg 4-6 inches (10-15 cm). You should feel the muscles in your outer hip lifting. ? Do not let your foot drift forward. ? Do not let your knee  roll toward the ceiling. 4. Hold this position for __________ seconds. 5. Slowly return your leg to the starting position. 6. Let your muscles relax completely after each repetition. Repeat __________ times. Complete this exercise __________ times a day. Straight leg raises This exercise stretches the muscles that move your hips away from the front of the pelvis (hip extensors). 1. Lie on your abdomen on a firm surface. You can put a pillow under your hips if that is more comfortable. 2. Tense the muscles in your buttocks and lift your left / right leg about 4-6 inches (10-15 cm). Keep your knee straight as you lift your leg. 3. Hold this position for __________ seconds. 4. Slowly lower your leg to the starting position. 5. Let your leg relax completely after each repetition. Repeat __________ times. Complete this exercise __________ times a day. This information is not intended to replace advice given to you by your health care provider. Make sure you discuss any questions you have with your health care provider. Document Revised: 01/06/2018 Document Reviewed: 01/06/2018 [employer] Patient Education  2020 [employer] Hand Exercises Hand exercises can be helpful for almost anyone. These exercises can strengthen the hands, improve flexibility and movement, and increase blood flow to the hands. These results can make work and daily tasks easier. Hand exercises can be especially helpful for people who have joint pain from arthritis or have nerve damage from overuse (carpal tunnel syndrome). These exercises can also help people who have injured a hand. Exercises Most of these hand exercises are gentle stretching and motion exercises. It is usually safe to do them often throughout the day. Warming up your hands before exercise may help to reduce stiffness. You can do this with gentle massage or by placing your hands in warm water for 10-15 minutes. It is normal to feel some stretching, pulling,  tightness, or mild discomfort as you begin new exercises. This will gradually improve. Stop an exercise right away if you feel sudden, severe pain or your pain gets worse. Ask your health care provider which exercises are best for you. Knuckle bend or "claw" fist 1. Stand or sit with your arm, hand, and all five fingers pointed straight up. Make sure to keep your wrist straight during the exercise. 2. Gently bend your fingers down toward your palm until the tips of your fingers are touching the top of your palm. Keep your big knuckle straight and just bend the small knuckles in your fingers. 3. Hold this position for   __________ seconds. 4. Straighten (extend) your fingers back to the starting position. Repeat this exercise 5-10 times with each hand. Full finger fist 1. Stand or sit with your arm, hand, and all five fingers pointed straight up. Make sure to keep your wrist straight during the exercise. 2. Gently bend your fingers into your palm until the tips of your fingers are touching the middle of your palm. 3. Hold this position for __________ seconds. 4. Extend your fingers back to the starting position, stretching every joint fully. Repeat this exercise 5-10 times with each hand. Straight fist 1. Stand or sit with your arm, hand, and all five fingers pointed straight up. Make sure to keep your wrist straight during the exercise. 2. Gently bend your fingers at the big knuckle, where your fingers meet your hand, and the middle knuckle. Keep the knuckle at the tips of your fingers straight and try to touch the bottom of your palm. 3. Hold this position for __________ seconds. 4. Extend your fingers back to the starting position, stretching every joint fully. Repeat this exercise 5-10 times with each hand. Tabletop 1. Stand or sit with your arm, hand, and all five fingers pointed straight up. Make sure to keep your wrist straight during the exercise. 2. Gently bend your fingers at the big  knuckle, where your fingers meet your hand, as far down as you can while keeping the small knuckles in your fingers straight. Think of forming a tabletop with your fingers. 3. Hold this position for __________ seconds. 4. Extend your fingers back to the starting position, stretching every joint fully. Repeat this exercise 5-10 times with each hand. Finger spread 1. Place your hand flat on a table with your palm facing down. Make sure your wrist stays straight as you do this exercise. 2. Spread your fingers and thumb apart from each other as far as you can until you feel a gentle stretch. Hold this position for __________ seconds. 3. Bring your fingers and thumb tight together again. Hold this position for __________ seconds. Repeat this exercise 5-10 times with each hand. Making circles 1. Stand or sit with your arm, hand, and all five fingers pointed straight up. Make sure to keep your wrist straight during the exercise. 2. Make a circle by touching the tip of your thumb to the tip of your index finger. 3. Hold for __________ seconds. Then open your hand wide. 4. Repeat this motion with your thumb and each finger on your hand. Repeat this exercise 5-10 times with each hand. Thumb motion 1. Sit with your forearm resting on a table and your wrist straight. Your thumb should be facing up toward the ceiling. Keep your fingers relaxed as you move your thumb. 2. Lift your thumb up as high as you can toward the ceiling. Hold for __________ seconds. 3. Bend your thumb across your palm as far as you can, reaching the tip of your thumb for the small finger (pinkie) side of your palm. Hold for __________ seconds. Repeat this exercise 5-10 times with each hand. Grip strengthening  1. Hold a stress ball or other soft ball in the middle of your hand. 2. Slowly increase the pressure, squeezing the ball as much as you can without causing pain. Think of bringing the tips of your fingers into the middle of  your palm. All of your finger joints should bend when doing this exercise. 3. Hold your squeeze for __________ seconds, then relax. Repeat this exercise 5-10 times with each   hand. Contact a health care provider if:  Your hand pain or discomfort gets much worse when you do an exercise.  Your hand pain or discomfort does not improve within 2 hours after you exercise. If you have any of these problems, stop doing these exercises right away. Do not do them again unless your health care provider says that you can. Get help right away if:  You develop sudden, severe hand pain or swelling. If this happens, stop doing these exercises right away. Do not do them again unless your health care provider says that you can. This information is not intended to replace advice given to you by your health care provider. Make sure you discuss any questions you have with your health care provider. Document Revised: 07/09/2018 Document Reviewed: 03/19/2018 Else[employer] Patient Education  2020 [employer]  

## 2019-07-13 ENCOUNTER — Ambulatory Visit: Payer: Medicare Other | Attending: Internal Medicine

## 2019-07-13 DIAGNOSIS — Z23 Encounter for immunization: Secondary | ICD-10-CM

## 2019-07-13 NOTE — Progress Notes (Signed)
   Covid-[facility] Vaccination [facility]  Name:  Lindsey [name]    MRN: [id number] DOB: 1956/01/31  07/13/2019  [name] was observed post Covid-19 immunization for 15 minutes without incident. She was provided with Vaccine Information Sheet and instruction to access the V-Safe system.   [name] was instructed to call 911 with any severe reactions post vaccine: Marland Kitchen Difficulty breathing  . Swelling of face and throat  . A fast heartbeat  . A bad rash all over body  . Dizziness and weakness   Immunizations Administered    Name Date Dose VIS Date Route   Pfizer COVID-19 Vaccine 07/13/2019  3:25 PM 0.3 mL 03/12/2019 Intramuscular   Manufacturer: [place]   Lot: [id number]   [place]: [id number]

## 2019-07-22 DIAGNOSIS — G43719 Chronic migraine without aura, intractable, without status migrainosus: Secondary | ICD-10-CM | POA: Diagnosis not present

## 2019-07-22 DIAGNOSIS — M791 Myalgia, unspecified site: Secondary | ICD-10-CM | POA: Diagnosis not present

## 2019-07-22 DIAGNOSIS — G518 Other disorders of facial nerve: Secondary | ICD-10-CM | POA: Diagnosis not present

## 2019-07-22 DIAGNOSIS — M542 Cervicalgia: Secondary | ICD-10-CM | POA: Diagnosis not present

## 2019-08-03 DIAGNOSIS — G8929 Other chronic pain: Secondary | ICD-10-CM | POA: Diagnosis not present

## 2019-08-03 DIAGNOSIS — N3010 Interstitial cystitis (chronic) without hematuria: Secondary | ICD-10-CM | POA: Diagnosis not present

## 2019-08-03 DIAGNOSIS — K582 Mixed irritable bowel syndrome: Secondary | ICD-10-CM | POA: Diagnosis not present

## 2019-08-03 DIAGNOSIS — R5382 Chronic fatigue, unspecified: Secondary | ICD-10-CM | POA: Diagnosis not present

## 2019-08-24 ENCOUNTER — Ambulatory Visit
Admission: RE | Admit: 2019-08-24 | Discharge: 2019-08-24 | Disposition: A | Discharge status: Home / Self Care | Payer: Medicare Other | Source: Ambulatory Visit | Attending: Family Medicine | Admitting: Family Medicine

## 2019-08-24 ENCOUNTER — Other Ambulatory Visit: Payer: Self-pay

## 2019-08-24 DIAGNOSIS — Z1231 Encounter for screening mammogram for malignant neoplasm of breast: Secondary | ICD-10-CM

## 2019-08-25 ENCOUNTER — Ambulatory Visit (INDEPENDENT_AMBULATORY_CARE_PROVIDER_SITE_OTHER): Payer: Medicare Other

## 2019-08-25 DIAGNOSIS — Z0000 Encounter for general adult medical examination without abnormal findings: Secondary | ICD-10-CM

## 2019-08-25 NOTE — Progress Notes (Signed)
PCP notes:  Health Maintenance: Shingrix- would like to receive at next office visit. Pharmacy never has it in stock   Abnormal Screenings: none   Patient concerns: none   Nurse concerns: none   Next PCP appt: none

## 2019-08-25 NOTE — Patient Instructions (Signed)
Lindsey [name] , Thank you for taking time to come for your Medicare Wellness Visit. I appreciate your ongoing commitment to your health goals. Please review the following plan we discussed and let me know if I can assist you in the future.   Screening recommendations/referrals: Colonoscopy: Up to date, completed 08/09/2010 Mammogram: Up to date, completed 08/24/2019 Bone Density: Up to date, completed 05/13/2018 Recommended yearly ophthalmology/optometry visit for glaucoma screening and checkup Recommended yearly dental visit for hygiene and checkup  Vaccinations: Influenza vaccine: Up to date, completed 11/26/2018 Pneumococcal vaccine: Up to date, completed 09/04/2012 Tdap vaccine: Up to date, completed 06/17/2017 Shingles vaccine: discussed    Advanced directives: Advance directive discussed with you today. Even though you declined this today please call our office should you change your mind and we can give you the proper paperwork for you to fill out.  Conditions/risks identified: hypertension, hyperlipidemia  Next appointment: none  Preventive Care 40-64 Years, Female Preventive care refers to lifestyle choices and visits with your health care provider that can promote health and wellness. What does preventive care include?  A yearly physical exam. This is also called an annual well check.  Dental exams once or twice a year.  Routine eye exams. Ask your health care provider how often you should have your eyes checked.  Personal lifestyle choices, including:  Daily care of your teeth and gums.  Regular physical activity.  Eating a healthy diet.  Avoiding tobacco and drug use.  Limiting alcohol use.  Practicing safe sex.  Taking low-dose aspirin daily starting at age 79.  Taking vitamin and mineral supplements as recommended by your health care provider. What happens during an annual well check? The services and screenings done by your health care provider during your annual  well check will depend on your age, overall health, lifestyle risk factors, and family history of disease. Counseling  Your health care provider may ask you questions about your:  Alcohol use.  Tobacco use.  Drug use.  Emotional well-being.  Home and relationship well-being.  Sexual activity.  Eating habits.  Work and work Statistician.  Method of birth control.  Menstrual cycle.  Pregnancy history. Screening  You may have the following tests or measurements:  Height, weight, and BMI.  Blood pressure.  Lipid and cholesterol levels. These may be checked every 5 years, or more frequently if you are over 77 years old.  Skin check.  Lung cancer screening. You may have this screening every year starting at age 50 if you have a 30-pack-year history of smoking and currently smoke or have quit within the past 15 years.  Fecal occult blood test (FOBT) of the stool. You may have this test every year starting at age 7.  Flexible sigmoidoscopy or colonoscopy. You may have a sigmoidoscopy every 5 years or a colonoscopy every 10 years starting at age 30.  Hepatitis C blood test.  Hepatitis B blood test.  Sexually transmitted disease (STD) testing.  Diabetes screening. This is done by checking your blood sugar (glucose) after you have not eaten for a while (fasting). You may have this done every 1-3 years.  Mammogram. This may be done every 1-2 years. Talk to your health care provider about when you should start having regular mammograms. This may depend on whether you have a family history of breast cancer.  BRCA-related cancer screening. This may be done if you have a family history of breast, ovarian, tubal, or peritoneal cancers.  Pelvic exam and Pap test.  This may be done every 3 years starting at age 51. Starting at age 51, this may be done every 5 years if you have a Pap test in combination with an HPV test.  Bone density scan. This is done to screen for osteoporosis.  You may have this scan if you are at high risk for osteoporosis. Discuss your test results, treatment options, and if necessary, the need for more tests with your health care provider. Vaccines  Your health care provider may recommend certain vaccines, such as:  Influenza vaccine. This is recommended every year.  Tetanus, diphtheria, and acellular pertussis (Tdap, Td) vaccine. You may need a Td booster every 10 years.  Zoster vaccine. You may need this after age 57.  Pneumococcal 13-valent conjugate (PCV13) vaccine. You may need this if you have certain conditions and were not previously vaccinated.  Pneumococcal polysaccharide (PPSV23) vaccine. You may need one or two doses if you smoke cigarettes or if you have certain conditions. Talk to your health care provider about which screenings and vaccines you need and how often you need them. This information is not intended to replace advice given to you by your health care provider. Make sure you discuss any questions you have with your health care provider. Document Released: 04/14/2015 Document Revised: 12/06/2015 Document Reviewed: 01/17/2015 Elsevier Interactive Patient Education  2017 [place] Prevention in the Home Falls can cause injuries. They can happen to people of all ages. There are many things you can do to make your home safe and to help prevent falls. What can I do on the outside of my home?  Regularly fix the edges of walkways and driveways and fix any cracks.  Remove anything that might make you trip as you walk through a door, such as a raised step or threshold.  Trim any bushes or trees on the path to your home.  Use bright outdoor lighting.  Clear any walking paths of anything that might make someone trip, such as rocks or tools.  Regularly check to see if handrails are loose or broken. Make sure that both sides of any steps have handrails.  Any raised decks and porches should have guardrails on  the edges.  Have any leaves, snow, or ice cleared regularly.  Use sand or salt on walking paths during winter.  Clean up any spills in your garage right away. This includes oil or grease spills. What can I do in the bathroom?  Use night lights.  Install grab bars by the toilet and in the tub and shower. Do not use towel bars as grab bars.  Use non-skid mats or decals in the tub or shower.  If you need to sit down in the shower, use a plastic, non-slip stool.  Keep the floor dry. Clean up any water that spills on the floor as soon as it happens.  Remove soap buildup in the tub or shower regularly.  Attach bath mats securely with double-sided non-slip rug tape.  Do not have throw rugs and other things on the floor that can make you trip. What can I do in the bedroom?  Use night lights.  Make sure that you have a light by your bed that is easy to reach.  Do not use any sheets or blankets that are too big for your bed. They should not hang down onto the floor.  Have a firm chair that has side arms. You can use this for support while you get dressed.  Do not have throw rugs and other things on the floor that can make you trip. What can I do in the kitchen?  Clean up any spills right away.  Avoid walking on wet floors.  Keep items that you use a lot in easy-to-reach places.  If you need to reach something above you, use a strong step stool that has a grab bar.  Keep electrical cords out of the way.  Do not use floor polish or wax that makes floors slippery. If you must use wax, use non-skid floor wax.  Do not have throw rugs and other things on the floor that can make you trip. What can I do with my stairs?  Do not leave any items on the stairs.  Make sure that there are handrails on both sides of the stairs and use them. Fix handrails that are broken or loose. Make sure that handrails are as long as the stairways.  Check any carpeting to make sure that it is firmly  attached to the stairs. Fix any carpet that is loose or worn.  Avoid having throw rugs at the top or bottom of the stairs. If you do have throw rugs, attach them to the floor with carpet tape.  Make sure that you have a light switch at the top of the stairs and the bottom of the stairs. If you do not have them, ask someone to add them for you. What else can I do to help prevent falls?  Wear shoes that:  Do not have high heels.  Have rubber bottoms.  Are comfortable and fit you well.  Are closed at the toe. Do not wear sandals.  If you use a stepladder:  Make sure that it is fully opened. Do not climb a closed stepladder.  Make sure that both sides of the stepladder are locked into place.  Ask someone to hold it for you, if possible.  Clearly mark and make sure that you can see:  Any grab bars or handrails.  First and last steps.  Where the edge of each step is.  Use tools that help you move around (mobility aids) if they are needed. These include:  Canes.  Walkers.  Scooters.  Crutches.  Turn on the lights when you go into a dark area. Replace any light bulbs as soon as they burn out.  Set up your furniture so you have a clear path. Avoid moving your furniture around.  If any of your floors are uneven, fix them.  If there are any pets around you, be aware of where they are.  Review your medicines with your doctor. Some medicines can make you feel dizzy. This can increase your chance of falling. Ask your doctor what other things that you can do to help prevent falls. This information is not intended to replace advice given to you by your health care provider. Make sure you discuss any questions you have with your health care provider. Document Released: 01/12/2009 Document Revised: 08/24/2015 Document Reviewed: 04/22/2014 Elsevier Interactive Patient Education  2017 [employer].

## 2019-08-25 NOTE — Progress Notes (Signed)
Subjective:   Lindsey [name] is a 64 y.o. female who presents for Medicare Annual (Subsequent) preventive examination.  Review of Systems: N/A   I connected with the patient today by telephone and verified that I am speaking with the correct person using two identifiers. Location patient: home Location nurse: work Persons participating in the virtual visit: patient, Marine scientist.   I discussed the limitations, risks, security and privacy concerns of performing an evaluation and management service by telephone and the availability of in person appointments. I also discussed with the patient that there may be a patient responsible charge related to this service. The patient expressed understanding and verbally consented to this telephonic visit.    Interactive audio and video telecommunications were attempted between this nurse and patient, however failed, due to patient having technical difficulties OR patient did not have access to video capability.  We continued and completed visit with audio only.     Cardiac Risk Factors include: hypertension;dyslipidemia     Objective:     Vitals: There were no vitals taken for this visit.  There is no height or weight on file to calculate BMI.  Advanced Directives 08/25/2019 08/07/2018 08/05/2017 04/23/2016 09/20/2015  Does Patient Have a Medical Advance Directive? No No No No No  Would patient like information on creating a medical advance directive? No - Patient declined No - Patient declined No - Patient declined - -    Tobacco Social History   Tobacco Use  Smoking Status Never Smoker  Smokeless Tobacco Never Used     Counseling given: Not Answered   Clinical Intake:  Pre-visit preparation completed: Yes  Pain : 0-10 Pain Score: 6  Pain Type: Chronic pain Pain Location: (fibromyalgia) Pain Descriptors / Indicators: Aching Pain Onset: More than a month ago Pain Frequency: Constant     Nutritional Risks: Nausea/ vomitting/ diarrhea(has  IBS) Diabetes: No  How often do you need to have someone help you when you read instructions, pamphlets, or other written materials from your doctor or pharmacy?: 1 - Never What is the last grade level you completed in school?: college graduate  Interpreter Needed?: No  Information entered by :: CJohnson, LPN  Past Medical History:  Diagnosis Date  . Arthritis   . Cataract   . Chronic fatigue   . Early onset macular degeneration   . Fibromyalgia   . Hemorrhoids   . Hyperglycemia 12/24/2013  . Hyperlipidemia   . Hypersomnia   . Hypertension   . IBS (irritable bowel syndrome)   . Interstitial cystitis   . Migraines   . Osteopenia   . Pelvic floor dysfunction   . PFO (patent foramen ovale)   . Tendonitis    hand  . Vitamin D deficiency    Past Surgical History:  Procedure Laterality Date  . ANKLE ARTHROSCOPY Right   . ANKLE FRACTURE SURGERY Left   . BLADDER SURGERY     and hydrodistension  . BREAST EXCISIONAL BIOPSY Bilateral    Multiple Benign Lumps removed    . BREAST LUMPECTOMY     many, bilateral  . CERVICAL DISCECTOMY  2006   fusion and plating  . CHOLECYSTECTOMY N/A 09/26/2015   Procedure: LAPAROSCOPIC CHOLECYSTECTOMY WITH INTRAOPERATIVE CHOLANGIOGRAM;  Surgeon: Johnathan Hausen, MD;  Location: [facility] ORS;  Service: General;  Laterality: N/A;  . COLONOSCOPY    . FRACTURE SURGERY     ankle fracture x 2  . NASAL SINUS SURGERY     multiple, x4  . UPPER GASTROINTESTINAL  ENDOSCOPY    . VAGINAL HYSTERECTOMY  1990   because of the IC partial   Family History  Problem Relation Age of Onset  . Diabetes Father   . Alcohol abuse Father   . Heart disease Father   . Fibromyalgia Mother   . Colon cancer Paternal Uncle   . Stomach cancer Paternal Grandfather   . Diabetes Paternal Grandfather   . Diabetes Paternal Grandmother   . Stroke Paternal Grandmother   . Breast cancer Neg Hx    Social History   Socioeconomic History  . Marital status: Married    Spouse  name: Not on file  . Number of children: 0  . Years of education: Not on file  . Highest education level: Not on file  Occupational History  . Occupation: Disabled from [place]: DISABLED  Tobacco Use  . Smoking status: Never Smoker  . Smokeless tobacco: Never Used  Substance and Sexual Activity  . Alcohol use: No    Alcohol/week: 0.0 standard drinks  . Drug use: No  . Sexual activity: Yes  Other Topics Concern  . Not on file  Social History Narrative  . Not on file   Social Determinants of Health   Financial Resource Strain: Low Risk   . Difficulty of Paying Living Expenses: Not hard at all  Food Insecurity: No Food Insecurity  . Worried About Charity fundraiser in the Last Year: Never true  . Ran Out of Food in the Last Year: Never true  Transportation Needs: No Transportation Needs  . Lack of Transportation (Medical): No  . Lack of Transportation (Non-Medical): No  Physical Activity: Inactive  . Days of Exercise per Week: 0 days  . Minutes of Exercise per Session: 0 min  Stress: No Stress Concern Present  . Feeling of Stress : Not at all  Social Connections:   . Frequency of Communication with Friends and Family:   . Frequency of Social Gatherings with Friends and Family:   . Attends Religious Services:   . Active Member of Clubs or Organizations:   . Attends Archivist Meetings:   Marland Kitchen Marital Status:     Outpatient Encounter Medications as of 08/25/2019  Medication Sig  . atenolol (TENORMIN) 100 MG tablet Take 1 tablet (100 mg total) by mouth daily.  . botulinum toxin Type A (BOTOX) 100 units SOLR injection Inject 200 Units into the muscle once.   . calcium carbonate (TUMS - DOSED IN MG ELEMENTAL CALCIUM) 500 MG chewable tablet Chew 1 tablet by mouth daily as needed for indigestion or heartburn.  . cholecalciferol (VITAMIN D) 1000 UNITS tablet Take 5,000 Units by mouth daily.   . diclofenac sodium (VOLTAREN) 1 % GEL Apply 2 to 4 gram to affected  joints up to 4 times daily  . DULoxetine (CYMBALTA) 60 MG capsule TAKE 1 CAPSULE BY MOUTH EVERY DAY  . famotidine (PEPCID) 20 MG tablet Take 20 mg by mouth 2 (two) times daily as needed for heartburn or indigestion.  . fentaNYL (DURAGESIC - DOSED MCG/HR) 50 MCG/HR Place 50 mcg onto the skin every other day.   . hydrOXYzine (ATARAX/VISTARIL) 25 MG tablet Take 25 mg by mouth 2 (two) times daily.   Marland Kitchen lidocaine (XYLOCAINE) 5 % ointment APPLY TO AFFECTED AREA EVERY DAY AS DIRECTED  . LUTEIN PO Take 1 tablet by mouth daily.   No facility-administered encounter medications on file as of 08/25/2019.    Activities of Daily Living In your  present state of health, do you have any difficulty performing the following activities: 08/25/2019  Hearing? N  Vision? Y  Comment has macular degeneration  Difficulty concentrating or making decisions? Y  Comment short term memory loss noted  Walking or climbing stairs? N  Dressing or bathing? N  Doing errands, shopping? N  Preparing Food and eating ? N  Using the Toilet? N  In the past six months, have you accidently leaked urine? Y  Comment sometimes when sneezing, coughing, etc.  Do you have problems with loss of bowel control? N  Managing your Medications? N  Managing your Finances? N  Housekeeping or managing your Housekeeping? N  Some recent data might be hidden    Patient Care Team: Tower, Wynelle Fanny, MD as PCP - General    Assessment:   This is a routine wellness examination for Lindsey [name].  Exercise Activities and Dietary recommendations Current Exercise Habits: The patient does not participate in regular exercise at present, Exercise limited by: None identified  Goals    . Patient Stated     Starting 08/07/2018, I will continue to take medications as prescribed.     . Patient Stated     08/25/2019, I will maintain and continue medications as prescribed.        Fall Risk Fall Risk  08/25/2019 08/07/2018 08/05/2017 04/23/2016 10/31/2014  Falls in the  past year? 1 0 Yes Yes Yes  Comment fell getting out of chair - averages 1 fall every 2 mths; lacerations and bruising only  pt reports 4-5 falls without injury; pt states she is having clumsiness -  Number falls in past yr: 0 - 2 or more 2 or more 2 or more  Injury with Fall? 0 - Yes No No  Risk Factor Category  - - High Fall Risk - -  Risk for fall due to : Medication side effect - Impaired balance/gait - -  Follow up Falls evaluation completed;Falls prevention discussed - - - -   Is the patient's home free of loose throw rugs in walkways, pet beds, electrical cords, etc?   yes      Grab bars in the bathroom? yes      Handrails on the stairs?   yes      Adequate lighting?   yes  Timed Get Up and Go performed: N/A  Depression Screen PHQ 2/9 Scores 08/25/2019 08/07/2018 08/05/2017 06/17/2017  PHQ - 2 Score 0 0 1 3  PHQ- 9 Score 0 0 12 15     Cognitive Function MMSE - Mini Mental State Exam 08/25/2019 08/07/2018 08/05/2017 04/23/2016  Orientation to time 5 5 5 5   Orientation to Place 5 5 5 5   Registration 3 3 3 3   Attention/ Calculation 5 0 0 0  Recall 3 3 3 3   Language- name 2 objects - 0 0 0  Language- repeat 1 1 1 1   Language- follow 3 step command - 0 3 3  Language- read & follow direction - 0 0 0  Write a sentence - 0 0 0  Copy design - 0 0 0  Total score - 17 20 20   Mini Cog  Mini-Cog screen was completed. Maximum score is 22. A value of 0 denotes this part of the MMSE was not completed or the patient failed this part of the Mini-Cog screening.       Immunization History  Administered Date(s) Administered  . Influenza Inj Mdck Quad Pf 12/16/2017  . Influenza Whole 12/31/2006  .  Influenza,inj,Quad PF,6+ Mos 12/24/2013, 02/20/2016, 10/30/2016, 11/30/2016, 11/26/2018  . PFIZER SARS-COV-2 Vaccination 06/18/2019, 07/13/2019  . Pneumococcal Polysaccharide-23 09/04/2012  . Td 04/23/2007  . Tdap 06/17/2017    Qualifies for Shingles Vaccine: Yes  Screening Tests Health  Maintenance  Topic Date Due  . INFLUENZA VACCINE  10/31/2019  . COLONOSCOPY  08/08/2020  . MAMMOGRAM  08/23/2021  . TETANUS/TDAP  06/18/2027  . COVID-19 Vaccine  Completed  . Hepatitis C Screening  Completed  . HIV Screening  Completed    Cancer Screenings: Lung: Low Dose CT Chest recommended if Age 63-80 years, 30 pack-year currently smoking OR have quit w/in 15 years. Patient does not qualify. Breast:  Up to date on Mammogram: Yes, completed 08/24/2019  Up to date of Bone Density/Dexa: Yes, completed 05/13/2018 Colorectal: completed 08/09/2010  Additional Screenings:  Hepatitis C Screening: 04/23/2016      Plan:   Patient will maintain and continue medications as prescribed.  I have personally reviewed and noted the following in the patient's chart:   . Medical and social history . Use of alcohol, tobacco or illicit drugs  . Current medications and supplements . Functional ability and status . Nutritional status . Physical activity . Advanced directives . List of other physicians . Hospitalizations, surgeries, and ER visits in previous 12 months . Vitals . Screenings to include cognitive, depression, and falls . Referrals and appointments  In addition, I have reviewed and discussed with patient certain preventive protocols, quality metrics, and best practice recommendations. A written personalized care plan for preventive services as well as general preventive health recommendations were provided to patient.     Andrez Grime, LPN  [id number]

## 2019-09-06 ENCOUNTER — Other Ambulatory Visit: Payer: Self-pay | Admitting: Rheumatology

## 2019-09-06 NOTE — Telephone Encounter (Signed)
Last visit: 07/01/2019 Next Visit: 12/30/2019  Okay to refill per Dr. Deveshwar  

## 2019-09-24 ENCOUNTER — Other Ambulatory Visit: Payer: Self-pay | Admitting: Family Medicine

## 2019-09-24 NOTE — Telephone Encounter (Signed)
Please schedule a f/u (or PE if she prefers) in late summer or early fall and refill until then

## 2019-09-24 NOTE — Telephone Encounter (Signed)
Pt has had a recent appt with the wellness nurse but it's been over a year since pt had a f/u or CPE, she did have an acute UTI appt on 03/10/19, please advise

## 2019-09-27 NOTE — Telephone Encounter (Signed)
Med refilled once and [name] will reach out to pt to try and get CPE scheduled  

## 2019-10-12 DIAGNOSIS — H47323 Drusen of optic disc, bilateral: Secondary | ICD-10-CM | POA: Diagnosis not present

## 2019-10-19 ENCOUNTER — Other Ambulatory Visit: Payer: Self-pay | Admitting: Family Medicine

## 2019-10-21 DIAGNOSIS — G518 Other disorders of facial nerve: Secondary | ICD-10-CM | POA: Diagnosis not present

## 2019-10-21 DIAGNOSIS — G43719 Chronic migraine without aura, intractable, without status migrainosus: Secondary | ICD-10-CM | POA: Diagnosis not present

## 2019-10-21 DIAGNOSIS — M542 Cervicalgia: Secondary | ICD-10-CM | POA: Diagnosis not present

## 2019-11-04 DIAGNOSIS — K582 Mixed irritable bowel syndrome: Secondary | ICD-10-CM | POA: Diagnosis not present

## 2019-11-04 DIAGNOSIS — M797 Fibromyalgia: Secondary | ICD-10-CM | POA: Diagnosis not present

## 2019-11-04 DIAGNOSIS — R5382 Chronic fatigue, unspecified: Secondary | ICD-10-CM | POA: Diagnosis not present

## 2019-11-04 DIAGNOSIS — G8929 Other chronic pain: Secondary | ICD-10-CM | POA: Diagnosis not present

## 2019-11-17 ENCOUNTER — Other Ambulatory Visit: Payer: Self-pay | Admitting: Family Medicine

## 2019-11-22 ENCOUNTER — Telehealth: Payer: Self-pay | Admitting: Family Medicine

## 2019-11-22 DIAGNOSIS — E7800 Pure hypercholesterolemia, unspecified: Secondary | ICD-10-CM

## 2019-11-22 DIAGNOSIS — E559 Vitamin D deficiency, unspecified: Secondary | ICD-10-CM

## 2019-11-22 DIAGNOSIS — R7303 Prediabetes: Secondary | ICD-10-CM

## 2019-11-22 DIAGNOSIS — I10 Essential (primary) hypertension: Secondary | ICD-10-CM

## 2019-11-22 NOTE — Telephone Encounter (Signed)
-----   Message from Cloyd Stagers, RT sent at 11/09/2019  3:10 PM EDT ----- Regarding: Lab Orders for Tuesday 8.24.2021 Please place lab orders for Tuesday 8.24.2021, office visit for physical on Tuesday 8.31.2021 Thank you, Dyke Maes RT(R)

## 2019-11-23 ENCOUNTER — Other Ambulatory Visit: Payer: Medicare Other

## 2019-11-24 ENCOUNTER — Other Ambulatory Visit: Payer: Self-pay

## 2019-11-24 ENCOUNTER — Other Ambulatory Visit (INDEPENDENT_AMBULATORY_CARE_PROVIDER_SITE_OTHER): Payer: Medicare Other

## 2019-11-24 DIAGNOSIS — E559 Vitamin D deficiency, unspecified: Secondary | ICD-10-CM

## 2019-11-24 DIAGNOSIS — I10 Essential (primary) hypertension: Secondary | ICD-10-CM | POA: Diagnosis not present

## 2019-11-24 DIAGNOSIS — R7303 Prediabetes: Secondary | ICD-10-CM

## 2019-11-24 DIAGNOSIS — E7800 Pure hypercholesterolemia, unspecified: Secondary | ICD-10-CM

## 2019-11-24 LAB — CBC WITH DIFFERENTIAL/PLATELET
Basophils Absolute: 0.1 10*3/uL (ref 0.0–0.1)
Basophils Relative: 1 % (ref 0.0–3.0)
Eosinophils Absolute: 0.1 10*3/uL (ref 0.0–0.7)
Eosinophils Relative: 2.1 % (ref 0.0–5.0)
HCT: 42.6 % (ref 36.0–46.0)
Hemoglobin: 14.2 g/dL (ref 12.0–15.0)
Lymphocytes Relative: 45.9 % (ref 12.0–46.0)
Lymphs Abs: 2.8 10*3/uL (ref 0.7–4.0)
MCHC: 33.3 g/dL (ref 30.0–36.0)
MCV: 88.6 fl (ref 78.0–100.0)
Monocytes Absolute: 0.4 10*3/uL (ref 0.1–1.0)
Monocytes Relative: 7.1 % (ref 3.0–12.0)
Neutro Abs: 2.7 10*3/uL (ref 1.4–7.7)
Neutrophils Relative %: 43.9 % (ref 43.0–77.0)
Platelets: 206 10*3/uL (ref 150.0–400.0)
RBC: 4.81 Mil/uL (ref 3.87–5.11)
RDW: 14.2 % (ref 11.5–15.5)
WBC: 6.1 10*3/uL (ref 4.0–10.5)

## 2019-11-24 LAB — COMPREHENSIVE METABOLIC PANEL
ALT: 24 U/L (ref 0–35)
AST: 17 U/L (ref 0–37)
Albumin: 3.9 g/dL (ref 3.5–5.2)
Alkaline Phosphatase: 80 U/L (ref 39–117)
BUN: 10 mg/dL (ref 6–23)
CO2: 32 mEq/L (ref 19–32)
Calcium: 9.8 mg/dL (ref 8.4–10.5)
Chloride: 97 mEq/L (ref 96–112)
Creatinine, Ser: 0.89 mg/dL (ref 0.40–1.20)
GFR: 64.04 mL/min (ref >60.00)
Glucose, Bld: 128 mg/dL — ABNORMAL HIGH (ref 70–99)
Potassium: 4.9 mEq/L (ref 3.5–5.1)
Sodium: 134 mEq/L — ABNORMAL LOW (ref 135–145)
Total Bilirubin: 0.5 mg/dL (ref 0.2–1.2)
Total Protein: 6.8 g/dL (ref 6.0–8.3)

## 2019-11-24 LAB — VITAMIN D 25 HYDROXY (VIT D DEFICIENCY, FRACTURES): VITD: 40.63 ng/mL (ref 30.00–100.00)

## 2019-11-24 LAB — LIPID PANEL
Cholesterol: 213 mg/dL — ABNORMAL HIGH (ref 0–200)
HDL: 50.3 mg/dL (ref >39.00)
LDL Cholesterol: 138 mg/dL — ABNORMAL HIGH (ref 0–99)
NonHDL: 162.83
Total CHOL/HDL Ratio: 4
Triglycerides: 122 mg/dL (ref 0.0–149.0)
VLDL: 24.4 mg/dL (ref 0.0–40.0)

## 2019-11-24 LAB — TSH: TSH: 6.96 u[IU]/mL — ABNORMAL HIGH (ref 0.35–4.50)

## 2019-11-24 LAB — HEMOGLOBIN A1C: Hgb A1c MFr Bld: 6.3 % (ref 4.6–6.5)

## 2019-11-30 ENCOUNTER — Ambulatory Visit (INDEPENDENT_AMBULATORY_CARE_PROVIDER_SITE_OTHER): Payer: Medicare Other | Admitting: Family Medicine

## 2019-11-30 ENCOUNTER — Other Ambulatory Visit: Payer: Self-pay | Admitting: Rheumatology

## 2019-11-30 ENCOUNTER — Encounter: Payer: Self-pay | Admitting: Family Medicine

## 2019-11-30 ENCOUNTER — Other Ambulatory Visit: Payer: Self-pay

## 2019-11-30 VITALS — BP 100/70 | HR 64 | Temp 97.3°F | Ht 63.86 in | Wt 183.5 lb

## 2019-11-30 DIAGNOSIS — Z0000 Encounter for general adult medical examination without abnormal findings: Secondary | ICD-10-CM | POA: Diagnosis not present

## 2019-11-30 DIAGNOSIS — N3010 Interstitial cystitis (chronic) without hematuria: Secondary | ICD-10-CM

## 2019-11-30 DIAGNOSIS — I10 Essential (primary) hypertension: Secondary | ICD-10-CM

## 2019-11-30 DIAGNOSIS — M8589 Other specified disorders of bone density and structure, multiple sites: Secondary | ICD-10-CM

## 2019-11-30 DIAGNOSIS — E7800 Pure hypercholesterolemia, unspecified: Secondary | ICD-10-CM

## 2019-11-30 DIAGNOSIS — R7303 Prediabetes: Secondary | ICD-10-CM

## 2019-11-30 DIAGNOSIS — E559 Vitamin D deficiency, unspecified: Secondary | ICD-10-CM

## 2019-11-30 DIAGNOSIS — M797 Fibromyalgia: Secondary | ICD-10-CM

## 2019-11-30 DIAGNOSIS — M359 Systemic involvement of connective tissue, unspecified: Secondary | ICD-10-CM | POA: Diagnosis not present

## 2019-11-30 NOTE — Assessment & Plan Note (Signed)
Reviewed health habits including diet and exercise and skin cancer prevention Reviewed appropriate screening tests for age  Also reviewed health mt list, fam hx and immunization status , as well as social and family history   See HPI Labs reviewed  Planning flu shot in the fall  covid immunized  Stressors /chronic pain - tough year , but attitude remains good  Enc search for exercise she can do

## 2019-11-30 NOTE — Assessment & Plan Note (Signed)
Continues rheumatology f/u 

## 2019-11-30 NOTE — Assessment & Plan Note (Signed)
Continues f/u with Dr Iverson Alamin and bad days

## 2019-11-30 NOTE — Assessment & Plan Note (Signed)
Lab Results  Component Value Date   HGBA1C 6.3 11/24/2019   disc imp of low glycemic diet and wt loss to prevent DM2  Will re check in 77mo

## 2019-11-30 NOTE — Assessment & Plan Note (Signed)
bp in fair control at this time  BP Readings from Last 1 Encounters:  11/30/19 100/70   No changes needed Most recent labs reviewed  Disc lifstyle change with low sodium diet and exercise

## 2019-11-30 NOTE — Telephone Encounter (Signed)
Last visit: 07/01/2019 Next Visit: 12/30/2019  Okay to refill per Dr. Estanislado Pandy

## 2019-11-30 NOTE — Patient Instructions (Addendum)
If you are interested in the new shingles vaccine (Shingrix) - call your local pharmacy to check on coverage and availability  If affordable, get on a wait list at your pharmacy to get the vaccine.  Check out chair yoga videos on u tube   For cholesterol  Avoid red meat/ fried foods/ egg yolks/ fatty breakfast meats/ butter, cheese and high fat dairy/ and shellfish    Try to get most of your carbohydrates from produce (with the exception of white potatoes)  Eat less bread/pasta/rice/snack foods/cereals/sweets and other items from the middle of the grocery store (processed carbs)  Let's re check cholesterol and thyroid in 6 months

## 2019-11-30 NOTE — Progress Notes (Signed)
Subjective:    Patient ID: Lindsey [name], female    DOB: 02/17/1956, 64 y.o.   MRN: [id number]  This visit occurred during the SARS-CoV-2 public health emergency.  Safety protocols were in place, including screening questions prior to the visit, additional usage of staff PPE, and extensive cleaning of exam room while observing appropriate contact time as indicated for disinfecting solutions.    HPI Here for health maintenance exam and to review chronic medical problems   Wt Readings from Last 3 Encounters:  11/30/19 183 lb 8 oz (83.2 kg)  07/01/19 181 lb 9.6 oz (82.4 kg)  03/10/19 182 lb 9 oz (82.8 kg)   31.64 kg/m  Has had a rough month  Her 26 yo dog was sick -hard on her  Car broke down on the way in  Attitude is good   She had amw in may  Noted interest in shingrix vaccine   Flu shot-will get in the fall  covid immunized -pfizer Tdap 3/19   Colonoscopy 5/12  Mammogram 5/21 Self breast exam -no lumps  Past hysterectomy  Does not see gyn   HTN  Taking atenolol  bp is stable today  No cp or palpitations or headaches or edema  No side effects to medicines  BP Readings from Last 3 Encounters:  11/30/19 100/70  07/01/19 135/82  03/10/19 118/74     Pulse Readings from Last 3 Encounters:  11/30/19 64  07/01/19 (!) 54  03/10/19 65    Osteopenia- dexa 2/20 Falls -one- bruise/ not bad (lost balance)  Fractures-none Supplements - taking D Vit D level is 40.6 Exercise -none except stretching   Interstitial cystitis - ups and downs   Fibromyalgia/auto immune dz -about the same Tried soma -did not help    Mood - harder during the pandemic and stress at home  Not sleeping well  Continues cymbalta   Hyperlipidemia Lab Results  Component Value Date   CHOL 213 (H) 11/24/2019   CHOL 182 08/10/2018   CHOL 209 (H) 06/17/2017   Lab Results  Component Value Date   HDL 50.30 11/24/2019   HDL 55.30 08/10/2018   HDL 61.20 06/17/2017   Lab Results    Component Value Date   LDLCALC 138 (H) 11/24/2019   LDLCALC 113 (H) 08/10/2018   LDLCALC 131 (H) 06/17/2017   Lab Results  Component Value Date   TRIG 122.0 11/24/2019   TRIG 69.0 08/10/2018   TRIG 87.0 06/17/2017   Lab Results  Component Value Date   CHOLHDL 4 11/24/2019   CHOLHDL 3 08/10/2018   CHOLHDL 3 06/17/2017   Lab Results  Component Value Date   LDLDIRECT 137.4 11/27/2012   LDLDIRECT 166.1 07/24/2012   LDLDIRECT 161.5 07/03/2011   Eating less in general  ? If different Some heart dz in the family In the past -simvastatin caused side effect    Prediabetes Lab Results  Component Value Date   HGBA1C 6.3 11/24/2019   Up from 6.1 last time   Lab Results  Component Value Date   TSH 6.96 (H) 11/24/2019    Patient Active Problem List   Diagnosis Date Noted  . Prediabetes 08/09/2018  . Estrogen deficiency 01/28/2018  . Autoimmune disease ([place]) 06/18/2017  . History of recurrent cystitis (IC) 08/16/2016  . Pelvic floor dysfunction 08/16/2016  . Dysuria 05/24/2016  . Macular degeneration 04/09/2016  . Osteoarthritis, knee 02/15/2016  . S/P laparoscopic cholecystectomy June 2017 09/26/2015  . Dermatitis 10/31/2014  . Encounter for Medicare  annual wellness exam 09/04/2012  . HSV (herpes simplex virus) infection 04/08/2012  . IBS (irritable bowel syndrome) 10/21/2011  . Routine general medical examination at a health care facility 07/02/2011  . Osteopenia 05/17/2009  . Depressed mood 10/05/2008  . GERD 05/05/2008  . OBSTRUCTIVE SLEEP APNEA 06/25/2007  . Hyperlipidemia 05/31/2007  . Essential hypertension 05/31/2007  . Other fatigue 04/23/2007  . Vitamin D deficiency 01/22/2007  . Migraine headache 01/22/2007  . Interstitial cystitis 01/22/2007  . MENOPAUSE-RELATED VASOMOTOR SYMPTOMS 01/22/2007  . OSTEOARTHROSIS, GENERALIZED, MULTIPLE SITES 01/22/2007  . Fibromyalgia 01/22/2007  . PATENT FORAMEN OVALE 01/22/2007   Past Medical History:  Diagnosis  Date  . Arthritis   . Cataract   . Chronic fatigue   . Early onset macular degeneration   . Fibromyalgia   . Hemorrhoids   . Hyperglycemia 12/24/2013  . Hyperlipidemia   . Hypersomnia   . Hypertension   . IBS (irritable bowel syndrome)   . Interstitial cystitis   . Migraines   . Osteopenia   . Pelvic floor dysfunction   . PFO (patent foramen ovale)   . Tendonitis    hand  . Vitamin D deficiency    Past Surgical History:  Procedure Laterality Date  . ANKLE ARTHROSCOPY Right   . ANKLE FRACTURE SURGERY Left   . BLADDER SURGERY     and hydrodistension  . BREAST EXCISIONAL BIOPSY Bilateral    Multiple Benign Lumps removed    . BREAST LUMPECTOMY     many, bilateral  . CERVICAL DISCECTOMY  2006   fusion and plating  . CHOLECYSTECTOMY N/A 09/26/2015   Procedure: LAPAROSCOPIC CHOLECYSTECTOMY WITH INTRAOPERATIVE CHOLANGIOGRAM;  Surgeon: Johnathan Hausen, MD;  Location: [facility] ORS;  Service: General;  Laterality: N/A;  . COLONOSCOPY    . FRACTURE SURGERY     ankle fracture x 2  . NASAL SINUS SURGERY     multiple, x4  . UPPER GASTROINTESTINAL ENDOSCOPY    . VAGINAL HYSTERECTOMY  1990   because of the IC partial   Social History   Tobacco Use  . Smoking status: Never Smoker  . Smokeless tobacco: Never Used  Vaping Use  . Vaping Use: Never used  Substance Use Topics  . Alcohol use: No    Alcohol/week: 0.0 standard drinks  . Drug use: No   Family History  Problem Relation Age of Onset  . Diabetes Father   . Alcohol abuse Father   . Heart disease Father   . Fibromyalgia Mother   . Colon cancer Paternal Uncle   . Stomach cancer Paternal Grandfather   . Diabetes Paternal Grandfather   . Diabetes Paternal Grandmother   . Stroke Paternal Grandmother   . Breast cancer Neg Hx    Allergies  Allergen Reactions  . Buspirone Hcl     REACTION: dystonia  . Simvastatin     REACTION: malaise  . Sulfa Antibiotics     nausea   Current Outpatient Medications on File Prior to  Visit  Medication Sig Dispense Refill  . atenolol (TENORMIN) 100 MG tablet TAKE 1 TABLET BY MOUTH EVERY DAY 30 tablet 5  . botulinum toxin Type A (BOTOX) 100 units SOLR injection Inject 200 Units into the muscle once.     . calcium carbonate (TUMS - DOSED IN MG ELEMENTAL CALCIUM) 500 MG chewable tablet Chew 1 tablet by mouth daily as needed for indigestion or heartburn.    . cholecalciferol (VITAMIN D) 1000 UNITS tablet Take 5,000 Units by mouth daily.     Marland Kitchen  diclofenac sodium (VOLTAREN) 1 % GEL Apply 2 to 4 gram to affected joints up to 4 times daily 400 g 2  . famotidine (PEPCID) 20 MG tablet Take 20 mg by mouth 2 (two) times daily as needed for heartburn or indigestion.    . fentaNYL (DURAGESIC - DOSED MCG/HR) 50 MCG/HR Place 50 mcg onto the skin every other day.     . hydrOXYzine (ATARAX/VISTARIL) 25 MG tablet Take 25 mg by mouth 2 (two) times daily.     Marland Kitchen lidocaine (XYLOCAINE) 5 % ointment APPLY TO AFFECTED AREA EVERY DAY AS DIRECTED    . LUTEIN PO Take 1 tablet by mouth daily.     No current facility-administered medications on file prior to visit.    Review of Systems  Constitutional: Positive for fatigue. Negative for activity change, appetite change, fever and unexpected weight change.  HENT: Negative for congestion, ear pain, rhinorrhea, sinus pressure and sore throat.   Eyes: Negative for pain, redness and visual disturbance.  Respiratory: Negative for cough, shortness of breath and wheezing.   Cardiovascular: Negative for chest pain and palpitations.  Gastrointestinal: Negative for abdominal pain, blood in stool, constipation and diarrhea.  Endocrine: Negative for polydipsia and polyuria.  Genitourinary: Positive for dysuria. Negative for frequency and urgency.  Musculoskeletal: Positive for arthralgias and myalgias. Negative for back pain and joint swelling.  Skin: Negative for pallor and rash.  Allergic/Immunologic: Negative for environmental allergies.  Neurological: Positive  for headaches. Negative for dizziness and syncope.  Hematological: Negative for adenopathy. Does not bruise/bleed easily.  Psychiatric/Behavioral: Negative for decreased concentration and dysphoric mood. The patient is not nervous/anxious.        Objective:   Physical Exam Constitutional:      General: She is not in acute distress.    Appearance: Normal appearance. She is well-developed. She is obese. She is not ill-appearing or diaphoretic.  HENT:     Head: Normocephalic and atraumatic.     Right Ear: Tympanic membrane, ear canal and external ear normal.     Left Ear: Tympanic membrane, ear canal and external ear normal.     Nose: Nose normal. No congestion.     Mouth/Throat:     Mouth: Mucous membranes are moist.     Pharynx: Oropharynx is clear. No posterior oropharyngeal erythema.  Eyes:     General: No scleral icterus.    Extraocular Movements: Extraocular movements intact.     Conjunctiva/sclera: Conjunctivae normal.     Pupils: Pupils are equal, round, and reactive to light.  Neck:     Thyroid: No thyromegaly.     Vascular: No carotid bruit or JVD.  Cardiovascular:     Rate and Rhythm: Normal rate and regular rhythm.     Pulses: Normal pulses.     Heart sounds: Normal heart sounds. No gallop.   Pulmonary:     Effort: Pulmonary effort is normal. No respiratory distress.     Breath sounds: Normal breath sounds. No wheezing.     Comments: Good air exch Chest:     Chest wall: No tenderness.  Abdominal:     General: Bowel sounds are normal. There is no distension or abdominal bruit.     Palpations: Abdomen is soft. There is no mass.     Tenderness: There is no abdominal tenderness.     Hernia: No hernia is present.  Genitourinary:    Comments: Breast exam: No mass, nodules, thickening, tenderness, bulging, retraction, inflamation, nipple discharge or skin changes noted.  No axillary or clavicular LA.     Musculoskeletal:        General: No tenderness. Normal range of  motion.     Cervical back: Normal range of motion and neck supple. No rigidity. No muscular tenderness.     Right lower leg: No edema.     Left lower leg: No edema.     Comments: Tenderness in shoulder girdle and back   Movement of any type is uncomfortable  Lymphadenopathy:     Cervical: No cervical adenopathy.  Skin:    General: Skin is warm and dry.     Coloration: Skin is not pale.     Findings: No erythema or rash.     Comments: Solar lentigines diffusely   Neurological:     Mental Status: She is alert. Mental status is at baseline.     Cranial Nerves: No cranial nerve deficit.     Motor: No abnormal muscle tone.     Coordination: Coordination normal.     Gait: Gait normal.     Deep Tendon Reflexes: Reflexes are normal and symmetric. Reflexes normal.  Psychiatric:        Mood and Affect: Mood normal.        Cognition and Memory: Cognition and memory normal.           Assessment & Plan:   Problem List Items Addressed This Visit      Cardiovascular and Mediastinum   Essential hypertension    bp in fair control at this time  BP Readings from Last 1 Encounters:  11/30/19 100/70   No changes needed Most recent labs reviewed  Disc lifstyle change with low sodium diet and exercise          Musculoskeletal and Integument   Osteopenia    Last dexa 2/20 -followed by rheumatology  One fall/ no fractures  D level is therapeutic  Needs more exercise-discussed chair yoga  Limited by chronic pain issues         Genitourinary   Interstitial cystitis    Continues f/u with Dr Iverson Alamin and bad days        Other   Vitamin D deficiency    Vitamin D level is therapeutic with current supplementation Disc importance of this to bone and overall health       Hyperlipidemia    Cholesterol is up despite no big diet change May be hereditary  LDL in 130s  Disc goals for lipids and reasons to control them Rev last labs with pt Rev low sat fat diet in  detail Will re check in 6 mo  May be a candidate for very low dose intermittent statin (intol of daily simvastatin in the past)      Fibromyalgia    Continues rheumatology f/u  Unable to do much exercise  Disc consideration of chair yoga Taking cymbalta  Also pain medication        Routine general medical examination at a health care facility - Primary    Reviewed health habits including diet and exercise and skin cancer prevention Reviewed appropriate screening tests for age  Also reviewed health mt list, fam hx and immunization status , as well as social and family history   See HPI Labs reviewed  Planning flu shot in the fall  covid immunized  Stressors /chronic pain - tough year , but attitude remains good  Enc search for exercise she can do        Autoimmune disease (  [place])    Continues rheumatology f/u       Prediabetes    Lab Results  Component Value Date   HGBA1C 6.3 11/24/2019   disc imp of low glycemic diet and wt loss to prevent DM2  Will re check in 24mo

## 2019-11-30 NOTE — Assessment & Plan Note (Signed)
Cholesterol is up despite no big diet change May be hereditary  LDL in 130s  Disc goals for lipids and reasons to control them Rev last labs with pt Rev low sat fat diet in detail Will re check in 6 mo  May be a candidate for very low dose intermittent statin (intol of daily simvastatin in the past)

## 2019-11-30 NOTE — Assessment & Plan Note (Signed)
Vitamin D level is therapeutic with current supplementation Disc importance of this to bone and overall health  

## 2019-11-30 NOTE — Assessment & Plan Note (Signed)
Continues rheumatology f/u  Unable to do much exercise  Disc consideration of chair yoga Taking cymbalta  Also pain medication

## 2019-11-30 NOTE — Assessment & Plan Note (Signed)
Last dexa 2/20 -followed by rheumatology  One fall/ no fractures  D level is therapeutic  Needs more exercise-discussed chair yoga  Limited by chronic pain issues

## 2019-12-20 NOTE — Progress Notes (Signed)
Office Visit Note  Patient: Lindsey [name]             Date of Birth: 1955/12/05           MRN: [id number]             PCP: Abner Greenspan, MD Referring: Tower, Wynelle Fanny, MD Visit Date: 12/30/2019 Occupation: @GUAROCC @  Subjective:  Arthritis (Doing good)   History of Present Illness: Lindsey [name] is a 64 y.o. female with history of fibromyalgia, osteoarthritis and degenerative disc disease.  She states she continues to have some generalized pain and discomfort.  She has been experiencing a lot of problems with fatigue.  She also has chronic insomnia.  She states she has become more forgetful.  She has been experiencing memory loss.  She had been evaluated at [facility] in the past for insomnia and had sleep study per patient.  Activities of Daily Living:  Patient reports morning stiffness for 24 hours.   Patient Reports nocturnal pain.  Difficulty dressing/grooming: Denies Difficulty climbing stairs: Reports Difficulty getting out of chair: Reports Difficulty using hands for taps, buttons, cutlery, and/or writing: Reports  Review of Systems  Constitutional: Positive for fatigue. Negative for night sweats, weight gain and weight loss.  HENT: Positive for mouth dryness. Negative for mouth sores, trouble swallowing, trouble swallowing and nose dryness.   Eyes: Positive for dryness. Negative for pain, redness and visual disturbance.  Respiratory: Negative for cough, shortness of breath and difficulty breathing.   Cardiovascular: Negative for chest pain, palpitations, hypertension, irregular heartbeat and swelling in legs/feet.  Gastrointestinal: Positive for constipation. Negative for blood in stool and diarrhea.  Endocrine: Positive for heat intolerance, excessive thirst and increased urination.  Genitourinary: Positive for difficulty urinating and painful urination. Negative for vaginal dryness.  Musculoskeletal: Positive for arthralgias, gait problem, joint pain, morning stiffness and muscle  tenderness. Negative for joint swelling, myalgias, muscle weakness and myalgias.  Skin: Negative for color change, rash, hair loss, skin tightness, ulcers and sensitivity to sunlight.  Allergic/Immunologic: Positive for susceptible to infections.  Neurological: Positive for memory loss. Negative for dizziness, night sweats and weakness.  Hematological: Negative for bruising/bleeding tendency and swollen glands.  Psychiatric/Behavioral: Positive for sleep disturbance. Negative for depressed mood. The patient is not nervous/anxious.     PMFS History:  Patient Active Problem List   Diagnosis Date Noted  . Prediabetes 08/09/2018  . Estrogen deficiency 01/28/2018  . Autoimmune disease ([place]) 06/18/2017  . History of recurrent cystitis (IC) 08/16/2016  . Pelvic floor dysfunction 08/16/2016  . Dysuria 05/24/2016  . Macular degeneration 04/09/2016  . Osteoarthritis, knee 02/15/2016  . S/P laparoscopic cholecystectomy June 2017 09/26/2015  . Dermatitis 10/31/2014  . Encounter for Medicare annual wellness exam 09/04/2012  . HSV (herpes simplex virus) infection 04/08/2012  . IBS (irritable bowel syndrome) 10/21/2011  . Routine general medical examination at a health care facility 07/02/2011  . Osteopenia 05/17/2009  . Depressed mood 10/05/2008  . GERD 05/05/2008  . OBSTRUCTIVE SLEEP APNEA 06/25/2007  . Hyperlipidemia 05/31/2007  . Essential hypertension 05/31/2007  . Other fatigue 04/23/2007  . Vitamin D deficiency 01/22/2007  . Migraine headache 01/22/2007  . Interstitial cystitis 01/22/2007  . MENOPAUSE-RELATED VASOMOTOR SYMPTOMS 01/22/2007  . OSTEOARTHROSIS, GENERALIZED, MULTIPLE SITES 01/22/2007  . Fibromyalgia 01/22/2007  . PATENT FORAMEN OVALE 01/22/2007    Past Medical History:  Diagnosis Date  . Arthritis   . Cataract   . Chronic fatigue   . Early onset macular degeneration   .  Fibromyalgia   . Hemorrhoids   . Hyperglycemia 12/24/2013  . Hyperlipidemia   . Hypersomnia     . Hypertension   . IBS (irritable bowel syndrome)   . Interstitial cystitis   . Migraines   . Osteopenia   . Pelvic floor dysfunction   . PFO (patent foramen ovale)   . Tendonitis    hand  . Vitamin D deficiency     Family History  Problem Relation Age of Onset  . Diabetes Father   . Alcohol abuse Father   . Heart disease Father   . Fibromyalgia Mother   . Colon cancer Paternal Uncle   . Stomach cancer Paternal Grandfather   . Diabetes Paternal Grandfather   . Diabetes Paternal Grandmother   . Stroke Paternal Grandmother   . Breast cancer Neg Hx    Past Surgical History:  Procedure Laterality Date  . ANKLE ARTHROSCOPY Right   . ANKLE FRACTURE SURGERY Left   . BLADDER SURGERY     and hydrodistension  . BREAST EXCISIONAL BIOPSY Bilateral    Multiple Benign Lumps removed    . BREAST LUMPECTOMY     many, bilateral  . CERVICAL DISCECTOMY  2006   fusion and plating  . CHOLECYSTECTOMY N/A 09/26/2015   Procedure: LAPAROSCOPIC CHOLECYSTECTOMY WITH INTRAOPERATIVE CHOLANGIOGRAM;  Surgeon: Johnathan Hausen, MD;  Location: [facility] ORS;  Service: General;  Laterality: N/A;  . COLONOSCOPY    . FRACTURE SURGERY     ankle fracture x 2  . NASAL SINUS SURGERY     multiple, x4  . UPPER GASTROINTESTINAL ENDOSCOPY    . VAGINAL HYSTERECTOMY  1990   because of the IC partial   Social History   Social History Narrative  . Not on file   Immunization History  Administered Date(s) Administered  . Influenza Inj Mdck Quad Pf 12/16/2017  . Influenza Whole 12/31/2006  . Influenza,inj,Quad PF,6+ Mos 12/24/2013, 02/20/2016, 10/30/2016, 11/30/2016, 11/26/2018  . PFIZER SARS-COV-2 Vaccination 06/18/2019, 07/13/2019  . Pneumococcal Polysaccharide-23 09/04/2012  . Td 04/23/2007  . Tdap 06/17/2017     Objective: Vital Signs: BP 105/70 (BP Location: Left Arm, Patient Position: Sitting, Cuff Size: Normal)   Pulse 63   Resp 16   Ht 5' 3.5" (1.613 m)   Wt 185 lb (83.9 kg)   BMI 32.26 kg/m     Physical Exam Vitals and nursing note reviewed.  Constitutional:      Appearance: She is well-developed.  HENT:     Head: Normocephalic and atraumatic.  Eyes:     Conjunctiva/sclera: Conjunctivae normal.  Cardiovascular:     Rate and Rhythm: Normal rate and regular rhythm.     Heart sounds: Normal heart sounds.  Pulmonary:     Effort: Pulmonary effort is normal.     Breath sounds: Normal breath sounds.  Abdominal:     General: Bowel sounds are normal.     Palpations: Abdomen is soft.  Musculoskeletal:     Cervical back: Normal range of motion.  Lymphadenopathy:     Cervical: No cervical adenopathy.  Skin:    General: Skin is warm and dry.     Capillary Refill: Capillary refill takes less than 2 seconds.  Neurological:     Mental Status: She is alert and oriented to person, place, and time.  Psychiatric:        Behavior: Behavior normal.      Musculoskeletal Exam: C-spine was in good range of motion.  She has painful limited range of motion of her  lumbar spine.  Shoulder joints, elbow joints, wrist joints, MCPs and PIPs with good range of motion.  Hip joints, knee joints, ankles, MTPs and PIPs with good range of motion with no synovitis.  She had generalized hyperalgesia and positive tender points.  CDAI Exam: CDAI Score: -- Patient Global: --; Provider Global: -- Swollen: --; Tender: -- Joint Exam 12/30/2019   No joint exam has been documented for this visit   There is currently no information documented on the homunculus. Go to the Rheumatology activity and complete the homunculus joint exam.  Investigation: No additional findings.  Imaging: No results found.  Recent Labs: Lab Results  Component Value Date   WBC 6.1 11/24/2019   HGB 14.2 11/24/2019   PLT 206.0 11/24/2019   NA 134 (L) 11/24/2019   K 4.9 11/24/2019   CL 97 11/24/2019   CO2 32 11/24/2019   GLUCOSE 128 (H) 11/24/2019   BUN 10 11/24/2019   CREATININE 0.89 11/24/2019   BILITOT 0.5  11/24/2019   ALKPHOS 80 11/24/2019   AST 17 11/24/2019   ALT 24 11/24/2019   PROT 6.8 11/24/2019   ALBUMIN 3.9 11/24/2019   CALCIUM 9.8 11/24/2019   GFRAA 98 04/23/2007    Speciality Comments: No specialty comments available.  Procedures:  No procedures performed Allergies: Buspirone hcl, Simvastatin, and Sulfa antibiotics   Assessment / Plan:     Visit Diagnoses: Fibromyalgia -she continues to have generalized discomfort.  She is on cymbalta 60 mg 1 capsule daily.  Need for regular exercise was emphasized.  I encouraged her swimming or water aerobics which she is not interested in doing at this point.  Primary insomnia-she has been suffering from chronic insomnia.  She states she has tried several medications prescribed by Dr. Amalia Hailey but could not tolerate the side effects.  I have given her a list of natural supplements for insomnia.  Other fatigue-she has been experiencing extreme fatigue which I believe is related to insomnia and fibromyalgia.  Primary osteoarthritis of both hands-she has DIP and PIP thickening.  Joint protection muscle strengthening was discussed.  Primary osteoarthritis of both knees - X-rays of both knees were updated on 12/31/2018.  X-rays were consistent with moderate osteoarthritis.  She will benefit from muscle strengthening exercises.  Osteopenia of multiple sites - DEXA is ordered by PCP in February 2020 showed T score of -1.7.  Use of calcium vitamin D and resistive exercises were discussed.  History of vitamin D deficiency-use of vitamin D supplement was discussed.  Interstitial cystitis - She is followed by Dr. Amalia Hailey.  History of depression-she continues to have some depression due to discomfort.  Memory loss-she is concerned about the cognition and memory loss.  Per her request will refer her to neurology.  History of IBS  History of hyperlipidemia  History of hypertension-her blood pressure is well controlled.  History of  hyperglycemia  S/P laparoscopic cholecystectomy June 2017  Educated about COVID-19 virus infection-she is fully vaccinated against COVID-19.  Use of booster was discussed.  Use of mask, social distancing and hand hygiene was discussed.  Orders: Orders Placed This Encounter  Procedures  . Ambulatory referral to Neurology   No orders of the defined types were placed in this encounter.   Follow-Up Instructions: Return in about 6 months (around 06/28/2020) for FMS,OA OPenia.   Bo Merino, MD  Note - This record has been created using Editor, commissioning.  Chart creation errors have been sought, but may not always  have been located. Such  creation errors do not reflect on  the standard of medical care.

## 2019-12-30 ENCOUNTER — Ambulatory Visit (INDEPENDENT_AMBULATORY_CARE_PROVIDER_SITE_OTHER): Payer: Medicare Other | Admitting: Rheumatology

## 2019-12-30 ENCOUNTER — Other Ambulatory Visit: Payer: Self-pay

## 2019-12-30 ENCOUNTER — Encounter: Payer: Self-pay | Admitting: Rheumatology

## 2019-12-30 VITALS — BP 105/70 | HR 63 | Resp 16 | Ht 63.5 in | Wt 185.0 lb

## 2019-12-30 DIAGNOSIS — R413 Other amnesia: Secondary | ICD-10-CM

## 2019-12-30 DIAGNOSIS — Z8639 Personal history of other endocrine, nutritional and metabolic disease: Secondary | ICD-10-CM

## 2019-12-30 DIAGNOSIS — M19041 Primary osteoarthritis, right hand: Secondary | ICD-10-CM | POA: Diagnosis not present

## 2019-12-30 DIAGNOSIS — M170 Bilateral primary osteoarthritis of knee: Secondary | ICD-10-CM | POA: Diagnosis not present

## 2019-12-30 DIAGNOSIS — Z8659 Personal history of other mental and behavioral disorders: Secondary | ICD-10-CM

## 2019-12-30 DIAGNOSIS — Z8679 Personal history of other diseases of the circulatory system: Secondary | ICD-10-CM

## 2019-12-30 DIAGNOSIS — Z8719 Personal history of other diseases of the digestive system: Secondary | ICD-10-CM

## 2019-12-30 DIAGNOSIS — N3010 Interstitial cystitis (chronic) without hematuria: Secondary | ICD-10-CM

## 2019-12-30 DIAGNOSIS — M8589 Other specified disorders of bone density and structure, multiple sites: Secondary | ICD-10-CM

## 2019-12-30 DIAGNOSIS — R5383 Other fatigue: Secondary | ICD-10-CM

## 2019-12-30 DIAGNOSIS — M19042 Primary osteoarthritis, left hand: Secondary | ICD-10-CM

## 2019-12-30 DIAGNOSIS — M797 Fibromyalgia: Secondary | ICD-10-CM

## 2019-12-30 DIAGNOSIS — F5101 Primary insomnia: Secondary | ICD-10-CM | POA: Diagnosis not present

## 2019-12-30 DIAGNOSIS — Z9049 Acquired absence of other specified parts of digestive tract: Secondary | ICD-10-CM

## 2019-12-30 DIAGNOSIS — Z7189 Other specified counseling: Secondary | ICD-10-CM

## 2020-01-04 ENCOUNTER — Telehealth: Payer: Self-pay

## 2020-01-04 NOTE — Telephone Encounter (Signed)
Patient called stating Dr. Estanislado Pandy was referring her to Neurology for a sleep study.  Patient states she received a call from [facility], but they do not do sleep studies.  Patient states she saw Dr. Brett Fairy from [facility] in the past.  Patient states she doesn't care who she is referred to, but it needs to be at a facility that offers sleep studies.

## 2020-01-04 NOTE — Telephone Encounter (Signed)
Please refer her for sleep study to [place].

## 2020-01-04 NOTE — Telephone Encounter (Signed)
Patient was referred to neurology for memory loss per office visit note. Patient cancelled referral for memory loss and wants to go for a sleep study. Please advise.

## 2020-01-05 ENCOUNTER — Other Ambulatory Visit: Payer: Self-pay | Admitting: *Deleted

## 2020-01-05 DIAGNOSIS — R413 Other amnesia: Secondary | ICD-10-CM

## 2020-01-05 DIAGNOSIS — F5101 Primary insomnia: Secondary | ICD-10-CM

## 2020-01-05 DIAGNOSIS — R5383 Other fatigue: Secondary | ICD-10-CM

## 2020-01-05 NOTE — Telephone Encounter (Signed)
Sleep study placed at [facility] - patient lives in [place]

## 2020-01-20 DIAGNOSIS — G43719 Chronic migraine without aura, intractable, without status migrainosus: Secondary | ICD-10-CM | POA: Diagnosis not present

## 2020-01-20 DIAGNOSIS — M542 Cervicalgia: Secondary | ICD-10-CM | POA: Diagnosis not present

## 2020-01-20 DIAGNOSIS — G518 Other disorders of facial nerve: Secondary | ICD-10-CM | POA: Diagnosis not present

## 2020-02-10 DIAGNOSIS — M797 Fibromyalgia: Secondary | ICD-10-CM | POA: Diagnosis not present

## 2020-02-10 DIAGNOSIS — N3010 Interstitial cystitis (chronic) without hematuria: Secondary | ICD-10-CM | POA: Diagnosis not present

## 2020-02-10 DIAGNOSIS — R350 Frequency of micturition: Secondary | ICD-10-CM | POA: Diagnosis not present

## 2020-02-10 DIAGNOSIS — R5382 Chronic fatigue, unspecified: Secondary | ICD-10-CM | POA: Diagnosis not present

## 2020-02-10 DIAGNOSIS — K582 Mixed irritable bowel syndrome: Secondary | ICD-10-CM | POA: Diagnosis not present

## 2020-02-10 DIAGNOSIS — R3915 Urgency of urination: Secondary | ICD-10-CM | POA: Diagnosis not present

## 2020-02-10 DIAGNOSIS — G8929 Other chronic pain: Secondary | ICD-10-CM | POA: Diagnosis not present

## 2020-02-10 DIAGNOSIS — R300 Dysuria: Secondary | ICD-10-CM | POA: Diagnosis not present

## 2020-02-14 ENCOUNTER — Ambulatory Visit: Payer: Medicare Other | Admitting: Neurology

## 2020-02-14 ENCOUNTER — Other Ambulatory Visit: Payer: Self-pay

## 2020-02-14 ENCOUNTER — Encounter: Payer: Self-pay | Admitting: Neurology

## 2020-02-14 VITALS — BP 158/93 | HR 67 | Ht 63.5 in | Wt 184.0 lb

## 2020-02-14 DIAGNOSIS — Z79891 Long term (current) use of opiate analgesic: Secondary | ICD-10-CM

## 2020-02-14 DIAGNOSIS — R351 Nocturia: Secondary | ICD-10-CM

## 2020-02-14 DIAGNOSIS — G4700 Insomnia, unspecified: Secondary | ICD-10-CM | POA: Diagnosis not present

## 2020-02-14 DIAGNOSIS — R0683 Snoring: Secondary | ICD-10-CM | POA: Diagnosis not present

## 2020-02-14 DIAGNOSIS — R519 Headache, unspecified: Secondary | ICD-10-CM | POA: Diagnosis not present

## 2020-02-14 DIAGNOSIS — R419 Unspecified symptoms and signs involving cognitive functions and awareness: Secondary | ICD-10-CM

## 2020-02-14 DIAGNOSIS — G4719 Other hypersomnia: Secondary | ICD-10-CM | POA: Diagnosis not present

## 2020-02-14 NOTE — Progress Notes (Signed)
Subjective:    Patient ID: Lindsey [name] is a 64 y.o. female.  HPI     Star Age, MD, PhD [facility] [address] P.O. [place], [place] [address]  Dear Abel Presto,   I saw your patient, Lindsey [name], upon your kind request, in my sleep clinic today for initial consultation of her sleep disorder, in particular, her chronic difficulty initiating and maintaining sleep.  She reports daytime somnolence and a prior diagnosis of sleep apnea for which she tried CPAP therapy.  The patient is unaccompanied today.  As you know, Lindsey [name] is a 64 year old right-handed woman with an underlying medical history of hypertension, hyperlipidemia, interstitial cystitis, arthritis, fibromyalgia, osteopenia, vitamin D deficiency, tendinitis, PFO, migraine headaches, followed by Dr. Domingo Cocking, and mild obesity, who reports she had testing years ago, prior sleep study results are not available for my review today.  She saw Dr. Annamaria Boots for her sleep difficulty.  She reports that she tried several medications to help her go to sleep but nothing worked or made her too nervous to continue with it.  She also reports trying a liquid medication to help her sleep, recalls taking Xyrem.  She reports that she had no dream sleep during her sleep study or at least this is what she recalls from her sleep study.  She could not tolerate CPAP, tried it for maybe a few weeks.  She does snore.  She has daytime somnolence. I reviewed your office note from 12/30/2019.  Her Epworth sleepiness score is 15-17 out of 24, fatigue severity score is 63 out of 63.  She is working on weight loss.  She lives with her husband, they have 1 dog in the household.  She reports being a light sleeper.  She is currently taking hydroxyzine prior to bedtime.  She also takes Cymbalta, fentanyl patch and baclofen as needed.  This was given to her by her urologist and she has also tried [place] per urology.  She has significant nocturia  about 6 or 7 times per average night.  She has recurrent headaches.  Bedtime is around midnight or 1 AM and rise time between 9 and 10, generally speaking.  She does not work.  She has also tried medication to help her stay awake during the day but she had more nervousness with it.  She has not not been driving, she has not driven a car in years.  She reports short-term memory issues which are noticed by her husband. She reports that her memory has not been very good.  She is a non-smoker and drinks no alcohol currently, occasional coffee, no daily caffeine.  Her Past Medical History Is Significant For: Past Medical History:  Diagnosis Date  . Arthritis   . Cataract   . Chronic fatigue   . Early onset macular degeneration   . Fibromyalgia   . Hemorrhoids   . Hyperglycemia 12/24/2013  . Hyperlipidemia   . Hypersomnia   . Hypertension   . IBS (irritable bowel syndrome)   . Interstitial cystitis   . Migraines   . Osteopenia   . Pelvic floor dysfunction   . PFO (patent foramen ovale)   . Tendonitis    hand  . Vitamin D deficiency     Her Past Surgical History Is Significant For: Past Surgical History:  Procedure Laterality Date  . ANKLE ARTHROSCOPY Right   . ANKLE FRACTURE SURGERY Left   . BLADDER SURGERY     and hydrodistension  .  BREAST EXCISIONAL BIOPSY Bilateral    Multiple Benign Lumps removed    . BREAST LUMPECTOMY     many, bilateral  . CERVICAL DISCECTOMY  2006   fusion and plating  . CHOLECYSTECTOMY N/A 09/26/2015   Procedure: LAPAROSCOPIC CHOLECYSTECTOMY WITH INTRAOPERATIVE CHOLANGIOGRAM;  Surgeon: Johnathan Hausen, MD;  Location: [facility] ORS;  Service: General;  Laterality: N/A;  . COLONOSCOPY    . FRACTURE SURGERY     ankle fracture x 2  . NASAL SINUS SURGERY     multiple, x4  . UPPER GASTROINTESTINAL ENDOSCOPY    . VAGINAL HYSTERECTOMY  1990   because of the IC partial    Her Family History Is Significant For: Family History  Problem Relation Age of Onset  .  Diabetes Father   . Alcohol abuse Father   . Heart disease Father   . Fibromyalgia Mother   . Colon cancer Paternal Uncle   . Stomach cancer Paternal Grandfather   . Diabetes Paternal Grandfather   . Diabetes Paternal Grandmother   . Stroke Paternal Grandmother   . Breast cancer Neg Hx     Her Social History Is Significant For: Social History   Socioeconomic History  . Marital status: Married    Spouse name: Not on file  . Number of children: 0  . Years of education: Not on file  . Highest education level: Not on file  Occupational History  . Occupation: Disabled from [employer]: DISABLED  Tobacco Use  . Smoking status: Never Smoker  . Smokeless tobacco: Never Used  Vaping Use  . Vaping Use: Never used  Substance and Sexual Activity  . Alcohol use: No    Alcohol/week: 0.0 standard drinks  . Drug use: No  . Sexual activity: Yes  Other Topics Concern  . Not on file  Social History Narrative  . Not on file   Social Determinants of Health   Financial Resource Strain: Low Risk   . Difficulty of Paying Living Expenses: Not hard at all  Food Insecurity: No Food Insecurity  . Worried About Charity fundraiser in the Last Year: Never true  . Ran Out of Food in the Last Year: Never true  Transportation Needs: No Transportation Needs  . Lack of Transportation (Medical): No  . Lack of Transportation (Non-Medical): No  Physical Activity: Inactive  . Days of Exercise per Week: 0 days  . Minutes of Exercise per Session: 0 min  Stress: No Stress Concern Present  . Feeling of Stress : Not at all  Social Connections:   . Frequency of Communication with Friends and Family: Not on file  . Frequency of Social Gatherings with Friends and Family: Not on file  . Attends Religious Services: Not on file  . Active Member of Clubs or Organizations: Not on file  . Attends Archivist Meetings: Not on file  . Marital Status: Not on file    Her Allergies Are:  Allergies   Allergen Reactions  . Buspirone Hcl     REACTION: dystonia  . Simvastatin     REACTION: malaise  . Sulfa Antibiotics     nausea  :   Her Current Medications Are:  Outpatient Encounter Medications as of 02/14/2020  Medication Sig  . atenolol (TENORMIN) 100 MG tablet TAKE 1 TABLET BY MOUTH EVERY DAY  . baclofen (LIORESAL) 20 MG tablet Take 20 mg by mouth daily as needed for muscle spasms.  . botulinum toxin Type A (BOTOX) 100 units  SOLR injection Inject 200 Units into the muscle once.   . calcium carbonate (TUMS - DOSED IN MG ELEMENTAL CALCIUM) 500 MG chewable tablet Chew 1 tablet by mouth daily as needed for indigestion or heartburn.  . cholecalciferol (VITAMIN D) 1000 UNITS tablet Take 5,000 Units by mouth daily.   . diclofenac sodium (VOLTAREN) 1 % GEL Apply 2 to 4 gram to affected joints up to 4 times daily  . doxycycline (DORYX) 100 MG EC tablet Take 100 mg by mouth 2 (two) times daily.  . DULoxetine (CYMBALTA) 60 MG capsule TAKE 1 CAPSULE BY MOUTH EVERY DAY  . famotidine (PEPCID) 20 MG tablet Take 20 mg by mouth 2 (two) times daily as needed for heartburn or indigestion.  . fentaNYL (DURAGESIC - DOSED MCG/HR) 50 MCG/HR Place 50 mcg onto the skin every other day.   . hydrOXYzine (ATARAX/VISTARIL) 50 MG tablet Take 100 mg by mouth at bedtime.  . lidocaine (XYLOCAINE) 5 % ointment APPLY TO AFFECTED AREA EVERY DAY AS DIRECTED  . LUTEIN PO Take 1 tablet by mouth daily.  . [DISCONTINUED] hydrOXYzine (ATARAX/VISTARIL) 25 MG tablet Take 25 mg by mouth 2 (two) times daily.    No facility-administered encounter medications on file as of 02/14/2020.  :  Review of Systems:  Out of a complete 14 point review of systems, all are reviewed and negative with the exception of these symptoms as listed below: Review of Systems  Neurological:       Pt presents today to discuss her sleep and memory. Pt has had a sleep study in the past and was given a cpap to try but only used in once or twice. Pt  does endorse snoring.  Epworth Sleepiness Scale 0= would never doze 1= slight chance of dozing 2= moderate chance of dozing 3= high chance of dozing  Sitting and reading:3 Watching TV: 1-2 Sitting inactive in a public place (ex. Theater or meeting): 1-2 As a passenger in a car for an hour without a break: 2 Lying down to rest in the afternoon: 3 Sitting and talking to someone: 2 Sitting quietly after lunch (no alcohol): 3 In a car, while stopped in traffic: 0 Total: 15-17     Objective:  Neurological Exam  Physical Exam Physical Examination:   Vitals:   02/14/20 1315  BP: (!) 158/93  Pulse: 67   General Examination: The patient is a very pleasant 64 y.o. female in no acute distress. She appears well-developed and well-nourished and well groomed.   HEENT: Normocephalic, atraumatic, pupils are equal, round and reactive to light. Extraocular tracking is good without limitation to gaze excursion or nystagmus noted. Normal smooth pursuit is noted. Hearing is grossly intact. Face is symmetric with normal facial animation. Speech is clear with no dysarthria noted. There is no hypophonia. There is no lip, neck/head, jaw or voice tremor. Neck is supple with full range of passive and active motion. There are no carotid bruits on auscultation. Oropharynx exam reveals: moderate mouth dryness, adequate dental hygiene and moderate airway crowding, due to small airway entry and thicker soft palate, larger uvula noted, tonsillar size of 1+.  Mallampati class II.  Neck circumference of 14-3/8 inches.  She has a minimal overbite.  Tongue protrudes centrally and palate elevates symmetrically.  Chest: Clear to auscultation without wheezing, rhonchi or crackles noted.  Heart: S1+S2+0, regular and normal without murmurs, rubs or gallops noted.   Abdomen: Soft, non-tender and non-distended with normal bowel sounds appreciated on auscultation.  Extremities: There  is no pitting edema in the distal  lower extremities bilaterally.   Skin: Warm and dry without trophic changes noted.  Musculoskeletal: exam reveals nonpitting puffiness in the left ankle.  She reports that she fractured her left ankle in the past and she still has discomfort and swelling in it.   Neurologically:  Mental status: The patient is awake, alert and oriented in all 4 spheres.  She is able to give her own history.  She is not able to recall the names of the medications that she tried for sleep in the past.   Her immediate and remote memory, attention, language skills and fund of knowledge are appropriate. There is no evidence of aphasia, agnosia, apraxia or anomia. Speech is clear with normal prosody and enunciation. Thought process is linear. Mood is normal and affect is normal.  Cranial nerves II - XII are as described above under HEENT exam. In addition: shoulder shrug is normal with equal shoulder height noted. Motor exam: Normal bulk, strength and tone is noted. There is no drift, tremor or rebound. Fine motor skills and coordination: grossly intact.  Cerebellar testing: No dysmetria or intention tremor. There is no truncal or gait ataxia.  Sensory exam: intact to light touch in the upper and lower extremities.  Gait, station and balance: She stands slightly slowly, posture is age-appropriate, she walks slightly slowly and cautiously, minimal limp on the left.  Assessment and plan:  In summary, Lindsey [name] is a very pleasant 64 y.o.-year old female with an underlying medical history of hypertension, hyperlipidemia, interstitial cystitis, arthritis, fibromyalgia, osteopenia, vitamin D deficiency, tendinitis, PFO, migraine headaches, followed by Dr. Domingo Cocking, and mild obesity, who presents for evaluation of her chronic sleep difficulty.  She was treated with CPAP therapy in the past, may have had mild sleep apnea.  She would be willing to get rechecked.  Sleep apnea can cause nocturia but she also has a history of  interstitial cystitis.  Sleep apnea untreated can also cause memory symptoms and recurrent headaches but she also has a diagnosis of migraines.  She is on several medications including narcotic pain medication.  She is advised that medication effect may also play a role in her concern for cognitive symptoms.  Nevertheless, at this juncture, we will proceed with reevaluation of her sleep with a sleep study.  If she has obstructive sleep apnea, I will offer her CPAP or AutoPap therapy.  She would be willing to try it.  She is advised to continue to work on [employer].  We talked about the importance of good sleep hygiene and good hydration.   She is advised that we will call her to schedule her  sleep study after insurance authorization.  I talked to her about sleep apnea, its prognosis and treatment options. We talked about medical treatments, surgical interventions and non-pharmacological approaches. I explained in particular the risks and ramifications of untreated moderate to severe OSA, especially with respect to developing cardiovascular disease down the Road, including congestive heart failure, difficult to treat hypertension, cardiac arrhythmias, or stroke. Even type 2 diabetes has, in part, been linked to untreated OSA. Symptoms of untreated OSA include daytime sleepiness, memory problems, mood irritability and mood disorder such as depression and anxiety, lack of energy, as well as recurrent headaches, especially morning headaches.  I plan to see her back after testing.  I answered all her questions today and she was in agreement with the plan. Thank you very much for allowing me to participate  in the care of this nice patient. If I can be of any further assistance to you please do not hesitate to call me at [phone].  Sincerely,   Star Age, MD, PhD

## 2020-02-14 NOTE — Patient Instructions (Signed)

## 2020-02-17 ENCOUNTER — Telehealth: Payer: Self-pay

## 2020-02-17 NOTE — Telephone Encounter (Signed)
Tried to call pt to schedule sleep study, phone rings and never goes to [employer]

## 2020-02-23 ENCOUNTER — Other Ambulatory Visit: Payer: Self-pay | Admitting: Rheumatology

## 2020-02-23 NOTE — Telephone Encounter (Signed)
Last visit: 12/30/2019 Next Visit: 06/29/2020  Okay to refill per Dr. Estanislado Pandy

## 2020-03-06 ENCOUNTER — Ambulatory Visit: Payer: Medicare Other | Attending: Internal Medicine

## 2020-03-06 DIAGNOSIS — Z23 Encounter for immunization: Secondary | ICD-10-CM

## 2020-03-06 NOTE — Progress Notes (Signed)
   Covid-19 Vaccination Clinic  Name:  RAFFAELA LADLEY    MRN: [id number] DOB: Mar 12, 1956  03/06/2020  Ms. Bari was observed post Covid-19 immunization for 15 minutes without incident. She was provided with Vaccine Information Sheet and instruction to access the V-Safe system.   Ms. Bevard was instructed to call 911 with any severe reactions post vaccine: Marland Kitchen Difficulty breathing  . Swelling of face and throat  . A fast heartbeat  . A bad rash all over body  . Dizziness and weakness   Immunizations Administered    Name Date Dose VIS Date Route   Pfizer COVID-19 Vaccine 03/06/2020  3:00 PM 0.3 mL 01/19/2020 Intramuscular   Manufacturer: [employer]   Lot: [id number]   NDC: [address]-7

## 2020-04-11 DIAGNOSIS — H353131 Nonexudative age-related macular degeneration, bilateral, early dry stage: Secondary | ICD-10-CM | POA: Diagnosis not present

## 2020-04-11 DIAGNOSIS — H47323 Drusen of optic disc, bilateral: Secondary | ICD-10-CM | POA: Diagnosis not present

## 2020-04-19 ENCOUNTER — Ambulatory Visit (INDEPENDENT_AMBULATORY_CARE_PROVIDER_SITE_OTHER): Payer: Medicare Other | Admitting: Neurology

## 2020-04-19 DIAGNOSIS — R0683 Snoring: Secondary | ICD-10-CM

## 2020-04-19 DIAGNOSIS — G4733 Obstructive sleep apnea (adult) (pediatric): Secondary | ICD-10-CM

## 2020-04-19 DIAGNOSIS — R519 Headache, unspecified: Secondary | ICD-10-CM

## 2020-04-19 DIAGNOSIS — G4719 Other hypersomnia: Secondary | ICD-10-CM

## 2020-04-19 DIAGNOSIS — R351 Nocturia: Secondary | ICD-10-CM

## 2020-04-19 DIAGNOSIS — Z79891 Long term (current) use of opiate analgesic: Secondary | ICD-10-CM

## 2020-04-19 DIAGNOSIS — G4700 Insomnia, unspecified: Secondary | ICD-10-CM

## 2020-04-19 DIAGNOSIS — R419 Unspecified symptoms and signs involving cognitive functions and awareness: Secondary | ICD-10-CM

## 2020-04-20 DIAGNOSIS — G43719 Chronic migraine without aura, intractable, without status migrainosus: Secondary | ICD-10-CM | POA: Diagnosis not present

## 2020-04-20 DIAGNOSIS — G518 Other disorders of facial nerve: Secondary | ICD-10-CM | POA: Diagnosis not present

## 2020-04-20 DIAGNOSIS — M542 Cervicalgia: Secondary | ICD-10-CM | POA: Diagnosis not present

## 2020-04-26 NOTE — Progress Notes (Signed)
Patient referred by Dr. Bo Merino, seen by me on 02/14/20, patient had a HST on 04/20/20.    Please call and notify the patient that the recent home sleep test showed obstructive sleep apnea in the moderate range. I recommend treatment in the form of autoPAP, which means, that we don't have to bring her in for a sleep study with CPAP, but will let her start using a so called autoPAP machine at home, which is a CPAP-like machine with self-adjusting pressures. We will send the order to a local DME company (of her choice, or as per insurance requirement). The DME representative will fit her with a mask, educate her on how to use the machine, how to put the mask on, etc. I have placed an order in the chart. Please send the order, talk to patient, send report to referring MD. We will need a FU in sleep clinic for 10 weeks post-PAP set up, please arrange that with me or one of our NPs. Also reinforce the need for compliance with treatment. Thanks,   Star Age, MD, PhD [facility])

## 2020-04-26 NOTE — Procedures (Signed)
   [facility] ([facility])  STUDY DATE: 04/19/20  DOB: [birth year]  MRN: [id number]  ORDERING CLINICIAN: Star Age, MD, PhD   REFERRING CLINICIAN: Dr. Bo Merino   CLINICAL INFORMATION/HISTORY: 65 year old woman with a history of hypertension, hyperlipidemia, interstitial cystitis, arthritis, fibromyalgia, osteopenia, vitamin D deficiency, tendinitis, PFO, migraine headaches, and mild obesity, who reports a prior diagnosis of OSA.   Epworth sleepiness score: 17/24.  BMI: 32.08 kg/m  Neck Circumference: 14-3/8  FINDINGS:   Total Record Time (hours, min): 6 hrs 25 min  Total Sleep Time (hours, min):  5 hrs 12 min   Percent REM (%):    6.40 %   Calculated pAHI (per hour): 18.1       REM pAHI: N/A    NREM pAHI: N/A Supine AHI: 17.6   Oxygen Saturation (%) Mean: 94  Minimum oxygen saturation (%):        88   O2 Saturation Range (%): 88-98  O2Saturation (minutes) <=88%: 0.1 min   Pulse Mean (bpm):    62  Pulse Range (52-86)   IMPRESSION: OSA (obstructive sleep apnea)  RECOMMENDATION:  This home sleep test demonstrates moderate obstructive sleep apnea with a total AHI of 18.1/hour and O2 nadir of 88%. Intermittent mild to moderate snoring was noted. Treatment with positive airway pressure is recommended. The patient will be advised to proceed with an autoPAP titration/trial at home for now. A full night titration study may be considered to optimize treatment settings, if needed down the road. Please note that untreated obstructive sleep apnea may carry additional perioperative morbidity. Patients with significant obstructive sleep apnea should receive perioperative PAP therapy and the surgeons and particularly the anesthesiologist should be informed of the diagnosis and the severity of the sleep disordered breathing. The patient should be cautioned not to drive, work at heights, or operate dangerous or heavy equipment when tired or sleepy. Review  and reiteration of good sleep hygiene measures should be pursued with any patient. Other causes of the patient's symptoms, including circadian rhythm disturbances, an underlying mood disorder, medication effect and/or an underlying medical problem cannot be ruled out based on this test. Clinical correlation is recommended. The patient and her referring provider will be notified of the test results. The patient will be seen in follow up in sleep clinic at [facility].  I certify that I have reviewed the raw data recording prior to the issuance of this report in accordance with the standards of the American Academy of Sleep Medicine (AASM).  INTERPRETING PHYSICIAN:  Star Age, MD, PhD  Board Certified in Neurology and Sleep Medicine [facility] [address] [place], [place] [address] [phone]

## 2020-04-26 NOTE — Addendum Note (Signed)
Addended by: Star Age on: 04/26/2020 04:24 PM   Modules accepted: Orders

## 2020-04-27 ENCOUNTER — Telehealth: Payer: Self-pay

## 2020-04-27 DIAGNOSIS — G4733 Obstructive sleep apnea (adult) (pediatric): Secondary | ICD-10-CM

## 2020-04-27 NOTE — Telephone Encounter (Signed)
I called the pt and advised we have placed the referral for the dental referral. Pt will call back and let us know if she has any issues getting set up.

## 2020-04-27 NOTE — Telephone Encounter (Signed)
We can certainly pursue that dental evaluation through Dr. Corky Sing office.  Please place a referral to Dr. Augustina Mood for treatment of obstructive sleep apnea and prior history of CPAP intolerance.  She will need the home sleep test report as well. I am copying Rance Muir as well.

## 2020-04-27 NOTE — Telephone Encounter (Signed)
I called the pt and we reviewed the sleep study in detail. Pt is not amenable to starting autopap treatment. She sts she does not feel like she would be able to tolerate the machine and would like to try a dental device if Dr. Rexene Alberts agrees. She sts she is already a pt of Dr. Corky Sing and would like to try this route for now.  I advised I would send message to Dr. Rexene Alberts to review and see if she thought the dental device would be beneficial. I advised mostly dental devices are recommended in mild apnea but in some cases can be helpful for moderate.  Pt verbalized understanding.

## 2020-04-27 NOTE — Telephone Encounter (Signed)
Referral for Dentistry has been placed in epic.

## 2020-04-27 NOTE — Addendum Note (Signed)
Addended by: Verlin Grills on: 04/27/2020 10:16 AM   Modules accepted: Orders

## 2020-04-27 NOTE — Telephone Encounter (Signed)
-----   Message from Star Age, MD sent at 04/26/2020  4:23 PM EST ----- Patient referred by Dr. Bo Merino, seen by me on 02/14/20, patient had a HST on 04/20/20.    Please call and notify the patient that the recent home sleep test showed obstructive sleep apnea in the moderate range. I recommend treatment in the form of autoPAP, which means, that we don't have to bring her in for a sleep study with CPAP, but will let her start using a so called autoPAP machine at home, which is a CPAP-like machine with self-adjusting pressures. We will send the order to a local DME company (of her choice, or as per insurance requirement). The DME representative will fit her with a mask, educate her on how to use the machine, how to put the mask on, etc. I have placed an order in the chart. Please send the order, talk to patient, send report to referring MD. We will need a FU in sleep clinic for 10 weeks post-PAP set up, please arrange that with me or one of our NPs. Also reinforce the need for compliance with treatment. Thanks,   Star Age, MD, PhD [facility])

## 2020-04-27 NOTE — Telephone Encounter (Signed)
I have talked patient and she has the telephone if she has not heard with in two weeks she will call . She has my contact as well . Thanks [facility]  Referral sent to Dr. Toy Cookey Telephone [phone] Fax [phone]

## 2020-05-10 ENCOUNTER — Telehealth: Payer: Self-pay | Admitting: Family Medicine

## 2020-05-10 NOTE — Telephone Encounter (Signed)
Pharmacy requests refill on: Atenolol 100 mg   LAST REFILL: 11/17/2019 (Q-30, R-5) LAST OV: 11/30/2019 NEXT OV: Not Scheduled  PHARMACY: [id number] [place], [place]   Earliest Fill Date: 05/19/2020

## 2020-05-15 DIAGNOSIS — R5382 Chronic fatigue, unspecified: Secondary | ICD-10-CM | POA: Diagnosis not present

## 2020-05-15 DIAGNOSIS — R3915 Urgency of urination: Secondary | ICD-10-CM | POA: Diagnosis not present

## 2020-05-15 DIAGNOSIS — G43709 Chronic migraine without aura, not intractable, without status migrainosus: Secondary | ICD-10-CM | POA: Diagnosis not present

## 2020-05-15 DIAGNOSIS — K582 Mixed irritable bowel syndrome: Secondary | ICD-10-CM | POA: Diagnosis not present

## 2020-05-15 DIAGNOSIS — R1030 Lower abdominal pain, unspecified: Secondary | ICD-10-CM | POA: Diagnosis not present

## 2020-05-15 DIAGNOSIS — R350 Frequency of micturition: Secondary | ICD-10-CM | POA: Diagnosis not present

## 2020-05-15 DIAGNOSIS — N3010 Interstitial cystitis (chronic) without hematuria: Secondary | ICD-10-CM | POA: Diagnosis not present

## 2020-05-15 DIAGNOSIS — M797 Fibromyalgia: Secondary | ICD-10-CM | POA: Diagnosis not present

## 2020-05-18 ENCOUNTER — Other Ambulatory Visit: Payer: Self-pay | Admitting: Family Medicine

## 2020-05-29 ENCOUNTER — Telehealth: Payer: Self-pay | Admitting: Family Medicine

## 2020-05-29 DIAGNOSIS — I10 Essential (primary) hypertension: Secondary | ICD-10-CM

## 2020-05-29 DIAGNOSIS — E038 Other specified hypothyroidism: Secondary | ICD-10-CM | POA: Insufficient documentation

## 2020-05-29 DIAGNOSIS — R7303 Prediabetes: Secondary | ICD-10-CM

## 2020-05-29 DIAGNOSIS — E7800 Pure hypercholesterolemia, unspecified: Secondary | ICD-10-CM

## 2020-05-29 DIAGNOSIS — R7989 Other specified abnormal findings of blood chemistry: Secondary | ICD-10-CM

## 2020-05-29 NOTE — Telephone Encounter (Signed)
-----   Message from Cloyd Stagers, RT sent at 05/15/2020  2:02 PM EST ----- Regarding: Lab Order for Tuesday 3.1.2022 Please place lab orders for Tuesday 3.1.2022, appt notes state "fasting 6 month f/u" Thank you, Dyke Maes RT(R)

## 2020-05-29 NOTE — Telephone Encounter (Signed)
Patient states that her pharmacy is not filling her prescription please advise patient of status of her request. EM

## 2020-05-29 NOTE — Telephone Encounter (Addendum)
See later refill request, it was refilled on 05/19/20 #90 tabs with 1 refill, called pharmacy and they did have med and it's ready for pick up

## 2020-05-30 ENCOUNTER — Other Ambulatory Visit (INDEPENDENT_AMBULATORY_CARE_PROVIDER_SITE_OTHER): Payer: Medicare Other

## 2020-05-30 ENCOUNTER — Other Ambulatory Visit: Payer: Self-pay

## 2020-05-30 DIAGNOSIS — E7800 Pure hypercholesterolemia, unspecified: Secondary | ICD-10-CM | POA: Diagnosis not present

## 2020-05-30 DIAGNOSIS — R7989 Other specified abnormal findings of blood chemistry: Secondary | ICD-10-CM | POA: Diagnosis not present

## 2020-05-30 DIAGNOSIS — I10 Essential (primary) hypertension: Secondary | ICD-10-CM | POA: Diagnosis not present

## 2020-05-30 DIAGNOSIS — R7303 Prediabetes: Secondary | ICD-10-CM

## 2020-05-30 LAB — T4, FREE: Free T4: 0.64 ng/dL (ref 0.60–1.60)

## 2020-05-30 LAB — LIPID PANEL
Cholesterol: 232 mg/dL — ABNORMAL HIGH (ref 0–200)
HDL: 51.7 mg/dL (ref >39.00)
LDL Cholesterol: 150 mg/dL — ABNORMAL HIGH (ref 0–99)
NonHDL: 180.31
Total CHOL/HDL Ratio: 4
Triglycerides: 152 mg/dL — ABNORMAL HIGH (ref 0.0–149.0)
VLDL: 30.4 mg/dL (ref 0.0–40.0)

## 2020-05-30 LAB — HEMOGLOBIN A1C: Hgb A1c MFr Bld: 6.1 % (ref 4.6–6.5)

## 2020-05-30 LAB — TSH: TSH: 6.98 u[IU]/mL — ABNORMAL HIGH (ref 0.35–4.50)

## 2020-06-01 ENCOUNTER — Telehealth: Payer: Self-pay | Admitting: *Deleted

## 2020-06-01 NOTE — Telephone Encounter (Signed)
Called pt and no answer and no VM (kept ringing) 

## 2020-06-01 NOTE — Telephone Encounter (Signed)
-----   Message from Abner Greenspan, MD sent at 05/30/2020  6:35 PM EST ----- Cholesterol is up more with LDL of 150 I would like to start a very low dose of crestor dosed two days a week if she is open to that  Avoid red meat/ fried foods/ egg yolks/ fatty breakfast meats/ butter, cheese and high fat dairy/ and shellfish   Let me know if she wants to try it  Blood sugar is stable TSH is stable as well - will likely need levothyroxine in the future and we will continue to monitor

## 2020-06-07 ENCOUNTER — Telehealth: Payer: Self-pay | Admitting: Family Medicine

## 2020-06-07 DIAGNOSIS — E7800 Pure hypercholesterolemia, unspecified: Secondary | ICD-10-CM

## 2020-06-07 MED ORDER — ROSUVASTATIN CALCIUM 5 MG PO TABS: ORAL_TABLET | ORAL | 3 refills | Status: DC

## 2020-06-07 NOTE — Telephone Encounter (Signed)
Addressed through result notes  

## 2020-06-07 NOTE — Telephone Encounter (Signed)
I sent crestor 5 mg twice weekly  Stop it if any concerns or side eff Re check lipids in 4-6 wk please

## 2020-06-07 NOTE — Telephone Encounter (Signed)
-----   Message from Ronna Polio, RN sent at 06/07/2020 12:21 PM EST ----- Called and spoke with patient regarding lab results. Patient verbalized understanding and stated that she was agreeable to start the medication. [facility] [place] [facility].

## 2020-06-14 ENCOUNTER — Other Ambulatory Visit: Payer: Self-pay | Admitting: Rheumatology

## 2020-06-14 NOTE — Telephone Encounter (Signed)
Next Visit: 06/29/2020  Last Visit: 12/30/2019  Last Fill:02/23/2020  Dx: Fibromyalgia   Current Dose per office note on 12/30/2019, cymbalta 60 mg 1 capsule daily  Okay to refill Cymbalta?

## 2020-06-16 NOTE — Progress Notes (Deleted)
Office Visit Note  Patient: Lindsey [name]             Date of Birth: Apr 23, 1955           MRN: [id number]             PCP: Abner Greenspan, MD Referring: Tower, Wynelle Fanny, MD Visit Date: 06/29/2020 Occupation: @GUAROCC @  Subjective:    History of Present Illness: Lindsey [name] is a 65 y.o. female with history of fibromyalgia and osteopenia.   Activities of Daily Living:  Patient reports morning stiffness for *** {minute/hour:19697}.   Patient {ACTIONS;DENIES/REPORTS:21021675::"Denies"} nocturnal pain.  Difficulty dressing/grooming: {ACTIONS;DENIES/REPORTS:21021675::"Denies"} Difficulty climbing stairs: {ACTIONS;DENIES/REPORTS:21021675::"Denies"} Difficulty getting out of chair: {ACTIONS;DENIES/REPORTS:21021675::"Denies"} Difficulty using hands for taps, buttons, cutlery, and/or writing: {ACTIONS;DENIES/REPORTS:21021675::"Denies"}  No Rheumatology ROS completed.   PMFS History:  Patient Active Problem List   Diagnosis Date Noted  . Elevated TSH 05/29/2020  . Prediabetes 08/09/2018  . Estrogen deficiency 01/28/2018  . Autoimmune disease ([place]) 06/18/2017  . History of recurrent cystitis (IC) 08/16/2016  . Pelvic floor dysfunction 08/16/2016  . Dysuria 05/24/2016  . Macular degeneration 04/09/2016  . Osteoarthritis, knee 02/15/2016  . S/P laparoscopic cholecystectomy June 2017 09/26/2015  . Dermatitis 10/31/2014  . Encounter for Medicare annual wellness exam 09/04/2012  . HSV (herpes simplex virus) infection 04/08/2012  . IBS (irritable bowel syndrome) 10/21/2011  . Routine general medical examination at a health care facility 07/02/2011  . Osteopenia 05/17/2009  . Depressed mood 10/05/2008  . GERD 05/05/2008  . OBSTRUCTIVE SLEEP APNEA 06/25/2007  . Hyperlipidemia 05/31/2007  . Essential hypertension 05/31/2007  . Other fatigue 04/23/2007  . Vitamin D deficiency 01/22/2007  . Migraine headache 01/22/2007  . Interstitial cystitis 01/22/2007  . MENOPAUSE-RELATED VASOMOTOR  SYMPTOMS 01/22/2007  . OSTEOARTHROSIS, GENERALIZED, MULTIPLE SITES 01/22/2007  . Fibromyalgia 01/22/2007  . PATENT FORAMEN OVALE 01/22/2007    Past Medical History:  Diagnosis Date  . Arthritis   . Cataract   . Chronic fatigue   . Early onset macular degeneration   . Fibromyalgia   . Hemorrhoids   . Hyperglycemia 12/24/2013  . Hyperlipidemia   . Hypersomnia   . Hypertension   . IBS (irritable bowel syndrome)   . Interstitial cystitis   . Migraines   . Osteopenia   . Pelvic floor dysfunction   . PFO (patent foramen ovale)   . Tendonitis    hand  . Vitamin D deficiency     Family History  Problem Relation Age of Onset  . Diabetes Father   . Alcohol abuse Father   . Heart disease Father   . Fibromyalgia Mother   . Colon cancer Paternal Uncle   . Stomach cancer Paternal Grandfather   . Diabetes Paternal Grandfather   . Diabetes Paternal Grandmother   . Stroke Paternal Grandmother   . Breast cancer Neg Hx    Past Surgical History:  Procedure Laterality Date  . ANKLE ARTHROSCOPY Right   . ANKLE FRACTURE SURGERY Left   . BLADDER SURGERY     and hydrodistension  . BREAST EXCISIONAL BIOPSY Bilateral    Multiple Benign Lumps removed    . BREAST LUMPECTOMY     many, bilateral  . CERVICAL DISCECTOMY  2006   fusion and plating  . CHOLECYSTECTOMY N/A 09/26/2015   Procedure: LAPAROSCOPIC CHOLECYSTECTOMY WITH INTRAOPERATIVE CHOLANGIOGRAM;  Surgeon: Johnathan Hausen, MD;  Location: [facility] ORS;  Service: General;  Laterality: N/A;  . COLONOSCOPY    . FRACTURE SURGERY     ankle  fracture x 2  . NASAL SINUS SURGERY     multiple, x4  . UPPER GASTROINTESTINAL ENDOSCOPY    . VAGINAL HYSTERECTOMY  1990   because of the IC partial   Social History   Social History Narrative  . Not on file   Immunization History  Administered Date(s) Administered  . Influenza Inj Mdck Quad Pf 12/16/2017  . Influenza Whole 12/31/2006  . Influenza,inj,Quad PF,6+ Mos 12/24/2013, 02/20/2016,  10/30/2016, 11/30/2016, 11/26/2018  . PFIZER(Purple Top)SARS-COV-2 Vaccination 06/18/2019, 07/13/2019, 03/06/2020  . Pneumococcal Polysaccharide-23 09/04/2012  . Td 04/23/2007  . Tdap 06/17/2017     Objective: Vital Signs: There were no vitals taken for this visit.   Physical Exam Vitals and nursing note reviewed.  Constitutional:      Appearance: She is well-developed.  HENT:     Head: Normocephalic and atraumatic.  Eyes:     Conjunctiva/sclera: Conjunctivae normal.  Pulmonary:     Effort: Pulmonary effort is normal.  Abdominal:     Palpations: Abdomen is soft.  Musculoskeletal:     Cervical back: Normal range of motion.  Skin:    General: Skin is warm and dry.     Capillary Refill: Capillary refill takes less than 2 seconds.  Neurological:     Mental Status: She is alert and oriented to person, place, and time.  Psychiatric:        Behavior: Behavior normal.      Musculoskeletal Exam: ***  CDAI Exam: CDAI Score: -- Patient Global: --; Provider Global: -- Swollen: --; Tender: -- Joint Exam 06/29/2020   No joint exam has been documented for this visit   There is currently no information documented on the homunculus. Go to the Rheumatology activity and complete the homunculus joint exam.  Investigation: No additional findings.  Imaging: No results found.  Recent Labs: Lab Results  Component Value Date   WBC 6.1 11/24/2019   HGB 14.2 11/24/2019   PLT 206.0 11/24/2019   NA 134 (L) 11/24/2019   K 4.9 11/24/2019   CL 97 11/24/2019   CO2 32 11/24/2019   GLUCOSE 128 (H) 11/24/2019   BUN 10 11/24/2019   CREATININE 0.89 11/24/2019   BILITOT 0.5 11/24/2019   ALKPHOS 80 11/24/2019   AST 17 11/24/2019   ALT 24 11/24/2019   PROT 6.8 11/24/2019   ALBUMIN 3.9 11/24/2019   CALCIUM 9.8 11/24/2019   GFRAA 98 04/23/2007    Speciality Comments: No specialty comments available.  Procedures:  No procedures performed Allergies: Buspirone hcl, Simvastatin, and  Sulfa antibiotics   Assessment / Plan:     Visit Diagnoses: Fibromyalgia  Primary insomnia  Other fatigue  Primary osteoarthritis of both hands  Primary osteoarthritis of both knees  Osteopenia of multiple sites  History of vitamin D deficiency  Interstitial cystitis  History of depression  History of IBS  History of hyperlipidemia  History of hypertension  History of hyperglycemia  S/P laparoscopic cholecystectomy June 2017  Memory loss  Orders: No orders of the defined types were placed in this encounter.  No orders of the defined types were placed in this encounter.   Face-to-face time spent with patient was *** minutes. Greater than 50% of time was spent in counseling and coordination of care.  Follow-Up Instructions: No follow-ups on file.   Ofilia Neas, PA-C  Note - This record has been created using Dragon software.  Chart creation errors have been sought, but may not always  have been located. Such creation errors do  not reflect on  the standard of medical care.

## 2020-06-29 ENCOUNTER — Ambulatory Visit: Payer: Medicare Other | Admitting: Physician Assistant

## 2020-06-29 DIAGNOSIS — R413 Other amnesia: Secondary | ICD-10-CM

## 2020-06-29 DIAGNOSIS — Z8639 Personal history of other endocrine, nutritional and metabolic disease: Secondary | ICD-10-CM

## 2020-06-29 DIAGNOSIS — N3010 Interstitial cystitis (chronic) without hematuria: Secondary | ICD-10-CM

## 2020-06-29 DIAGNOSIS — F5101 Primary insomnia: Secondary | ICD-10-CM

## 2020-06-29 DIAGNOSIS — Z9049 Acquired absence of other specified parts of digestive tract: Secondary | ICD-10-CM

## 2020-06-29 DIAGNOSIS — M170 Bilateral primary osteoarthritis of knee: Secondary | ICD-10-CM

## 2020-06-29 DIAGNOSIS — Z8679 Personal history of other diseases of the circulatory system: Secondary | ICD-10-CM

## 2020-06-29 DIAGNOSIS — Z8659 Personal history of other mental and behavioral disorders: Secondary | ICD-10-CM

## 2020-06-29 DIAGNOSIS — M797 Fibromyalgia: Secondary | ICD-10-CM

## 2020-06-29 DIAGNOSIS — M8589 Other specified disorders of bone density and structure, multiple sites: Secondary | ICD-10-CM

## 2020-06-29 DIAGNOSIS — R5383 Other fatigue: Secondary | ICD-10-CM

## 2020-06-29 DIAGNOSIS — M19041 Primary osteoarthritis, right hand: Secondary | ICD-10-CM

## 2020-06-29 DIAGNOSIS — Z8719 Personal history of other diseases of the digestive system: Secondary | ICD-10-CM

## 2020-06-29 NOTE — Progress Notes (Signed)
Office Visit Note  Patient: Lindsey [name]             Date of Birth: 08-28-1955           MRN: [id number]             PCP: Abner Greenspan, MD Referring: Tower, Wynelle Fanny, MD Visit Date: 07/03/2020 Occupation: @GUAROCC @  Subjective:  Generalized pain   History of Present Illness: Lindsey [name] is a 65 y.o. female with history of fibromyalgia and osteoarthritis.  She continues to have generalized myalgias and muscle tenderness due to underlying fibromyalgia.  According to the patient she has several flares per week.  She is taking cymbalta 60 mg 1 capsule by mouth daily and baclofen 20 mg by mouth daily as needed for muscle spasms.  She experiences intermittent pain and stiffness in both hands but denies any joint swelling.  She states that her left thumb has started to trigger again.  She continues to have difficulty climbing steps due to the discomfort in her knee joints.  She uses voltaren gel as needed for pain relief.  She has been trying to stretch on a regular basis but has not been performing any other stretching exercises.  She continues to have chronic fatigue.  According to the patient she had a sleep study on 02/14/2020 and was diagnosed with moderate sleep apnea.  It was recommended that she use a CPAP but she has tried that in the past and could not tolerate it.  She was going to try a mouthpiece but it was not covered by her insurance.  She continues to wake up feeling unrested in the morning.     Activities of Daily Living:  Patient reports morning stiffness for all day.   Patient Reports nocturnal pain.  Difficulty dressing/grooming: Denies Difficulty climbing stairs: Reports Difficulty getting out of chair: Reports Difficulty using hands for taps, buttons, cutlery, and/or writing: Reports  Review of Systems  Constitutional: Positive for fatigue.  HENT: Positive for mouth sores, mouth dryness and nose dryness.   Eyes: Positive for visual disturbance and dryness. Negative for  pain.  Respiratory: Negative for cough, hemoptysis, shortness of breath and difficulty breathing.   Cardiovascular: Negative for chest pain, palpitations, hypertension and swelling in legs/feet.  Gastrointestinal: Positive for constipation (Hx of IBS) and heartburn. Negative for blood in stool and diarrhea.  Endocrine: Positive for increased urination.  Genitourinary: Positive for painful urination (Hx of IC).  Musculoskeletal: Positive for arthralgias, joint pain, myalgias, muscle weakness, morning stiffness, muscle tenderness and myalgias. Negative for joint swelling.  Skin: Positive for rash. Negative for color change, pallor, hair loss, nodules/bumps, skin tightness, ulcers and sensitivity to sunlight.  Neurological: Positive for headaches. Negative for numbness.  Hematological: Negative for swollen glands.  Psychiatric/Behavioral: Positive for depressed mood (Managed by cymbalta) and sleep disturbance. The patient is nervous/anxious.     PMFS History:  Patient Active Problem List   Diagnosis Date Noted  . Elevated TSH 05/29/2020  . Prediabetes 08/09/2018  . Estrogen deficiency 01/28/2018  . Autoimmune disease ([place]) 06/18/2017  . History of recurrent cystitis ([place]) 08/16/2016  . Pelvic floor dysfunction 08/16/2016  . Dysuria 05/24/2016  . Macular degeneration 04/09/2016  . Osteoarthritis, knee 02/15/2016  . S/P laparoscopic cholecystectomy June 2017 09/26/2015  . Dermatitis 10/31/2014  . Encounter for Medicare annual wellness exam 09/04/2012  . HSV (herpes simplex virus) infection 04/08/2012  . IBS (irritable bowel syndrome) 10/21/2011  . Routine general medical examination at a  health care facility 07/02/2011  . Osteopenia 05/17/2009  . Depressed mood 10/05/2008  . GERD 05/05/2008  . OBSTRUCTIVE SLEEP APNEA 06/25/2007  . Hyperlipidemia 05/31/2007  . Essential hypertension 05/31/2007  . Other fatigue 04/23/2007  . Vitamin D deficiency 01/22/2007  . Migraine headache  01/22/2007  . Interstitial cystitis 01/22/2007  . MENOPAUSE-RELATED VASOMOTOR SYMPTOMS 01/22/2007  . OSTEOARTHROSIS, GENERALIZED, MULTIPLE SITES 01/22/2007  . Fibromyalgia 01/22/2007  . PATENT FORAMEN OVALE 01/22/2007    Past Medical History:  Diagnosis Date  . Arthritis   . Cataract   . Chronic fatigue   . Early onset macular degeneration   . Fibromyalgia   . Hemorrhoids   . Hyperglycemia 12/24/2013  . Hyperlipidemia   . Hypersomnia   . Hypertension   . IBS (irritable bowel syndrome)   . Interstitial cystitis   . Migraines   . Osteopenia   . Pelvic floor dysfunction   . PFO (patent foramen ovale)   . Tendonitis    hand  . Vitamin D deficiency     Family History  Problem Relation Age of Onset  . Diabetes Father   . Alcohol abuse Father   . Heart disease Father   . Fibromyalgia Mother   . Colon cancer Paternal Uncle   . Stomach cancer Paternal Grandfather   . Diabetes Paternal Grandfather   . Diabetes Paternal Grandmother   . Stroke Paternal Grandmother   . Breast cancer Neg Hx    Past Surgical History:  Procedure Laterality Date  . ANKLE ARTHROSCOPY Right   . ANKLE FRACTURE SURGERY Left   . BLADDER SURGERY     and hydrodistension  . BREAST EXCISIONAL BIOPSY Bilateral    Multiple Benign Lumps removed    . BREAST LUMPECTOMY     many, bilateral  . CERVICAL DISCECTOMY  2006   fusion and plating  . CHOLECYSTECTOMY N/A 09/26/2015   Procedure: LAPAROSCOPIC CHOLECYSTECTOMY WITH INTRAOPERATIVE CHOLANGIOGRAM;  Surgeon: Johnathan Hausen, MD;  Location: [facility] OR[facility];  Service: General;  Laterality: N/A;  . COLONOSCOPY    . FRACTURE SURGERY     ankle fracture x 2  . NASAL SINUS SURGERY     multiple, x4  . UPPER GASTROINTESTINAL ENDOSCOPY    . VAGINAL HYSTERECTOMY  1990   because of the IC partial   Social History   Social History Narrative  . Not on file   Immunization History  Administered Date(s) Administered  . Influenza Inj Mdck Quad Pf 12/16/2017  . Influenza  Whole 12/31/2006  . Influenza,inj,Quad PF,6+ Mos 12/24/2013, 02/20/2016, 10/30/2016, 11/30/2016, 11/26/2018  . PFIZER(Purple Top)SARS-COV-2 Vaccination 06/18/2019, 07/13/2019, 03/06/2020  . Pneumococcal Polysaccharide-23 09/04/2012  . Td 04/23/2007  . Tdap 06/17/2017     Objective: Vital Signs: BP 128/83 (BP Location: Left Arm, Patient Position: Sitting, Cuff Size: Normal)   Pulse 69   Resp 14   Ht 5\' 4"  (1.626 m)   Wt 185 lb (83.9 kg)   BMI 31.76 kg/m    Physical Exam Vitals and nursing note reviewed.  Constitutional:      Appearance: She is well-developed.  HENT:     Head: Normocephalic and atraumatic.  Eyes:     Conjunctiva/sclera: Conjunctivae normal.  Pulmonary:     Effort: Pulmonary effort is normal.  Abdominal:     Palpations: Abdomen is soft.  Musculoskeletal:     Cervical back: Normal range of motion.  Skin:    General: Skin is warm and dry.     Capillary Refill: Capillary refill takes less than  2 seconds.  Neurological:     Mental Status: She is alert and oriented to person, place, and time.  Psychiatric:        Behavior: Behavior normal.      Musculoskeletal Exam: Generalized hyperalgesia and positive tender points.  C-spine limited ROM.  Trapezius muscle tension and tenderness bilaterally.  Shoulder joints, elbow joints, wrist joints, MCPs, PIPs, and DIPs good ROM with no synovitis.  Complete fist formation bilaterally.  PIP and DIP thickening consistent with ostearthritis of both hands.  Left trigger thumb.  Hip joints have good ROM with discomfort bilaterally.  Tenderness over trochanteric bursa bilaterally. Knee joints good ROM with no warmth or effusion.  Ankle joints good ROM with no tenderness or swelling.   CDAI Exam: CDAI Score: -- Patient Global: --; Provider Global: -- Swollen: --; Tender: -- Joint Exam 07/03/2020   No joint exam has been documented for this visit   There is currently no information documented on the homunculus. Go to the  Rheumatology activity and complete the homunculus joint exam.  Investigation: No additional findings.  Imaging: No results found.  Recent Labs: Lab Results  Component Value Date   WBC 6.1 11/24/2019   HGB 14.2 11/24/2019   PLT 206.0 11/24/2019   NA 134 (L) 11/24/2019   K 4.9 11/24/2019   CL 97 11/24/2019   CO2 32 11/24/2019   GLUCOSE 128 (H) 11/24/2019   BUN 10 11/24/2019   CREATININE 0.89 11/24/2019   BILITOT 0.5 11/24/2019   ALKPHOS 80 11/24/2019   AST 17 11/24/2019   ALT 24 11/24/2019   PROT 6.8 11/24/2019   ALBUMIN 3.9 11/24/2019   CALCIUM 9.8 11/24/2019   GFRAA 98 04/23/2007    Speciality Comments: No specialty comments available.  Procedures:  No procedures performed Allergies: Buspirone hcl, Simvastatin, and Sulfa antibiotics   Assessment / Plan:     Visit Diagnoses: Fibromyalgia: She has generalized hyperalgesia and positive tender points on exam.  She continues to have frequent fibromyalgia flares.  She has constant myalgias and muscle tenderness, which can be exacerbated by weather changes.  She continues to take Cymbalta 60 mg 1 capsule by mouth daily and baclofen 20 mg as needed for muscle spasms.  She has chronic fatigue secondary to insomnia.  She was recently diagnosed with obstructive sleep apnea after completing a home sleep test on 04/19/2020.  She could not tolerate using a CPAP in the past but was strongly encouraged to follow back up to discuss other OSA management options to help to try to improve her daytime drowsiness. We also discussed the importance of regular exercise and good sleep hygiene.  She was advised to notify us if she develops any new or worsening symptoms. She will follow up in 6 months.   Primary insomnia - She has interrupted sleep and difficulty falling asleep at night.  She has tried several sleep aids in the past but could not tolerate these. Discussed the importance of good sleep hygiene.   She underwent nocturnal polysomnography on  02/14/2020 followed by a home sleep test on 04/19/2020.  According to the patient she was diagnosed with obstructive sleep apnea.  It was encouraged that she start using a CPAP but she declined due to not being able to tolerate CPAP in the past.  It was recommended that she try a mouthpiece but her insurance would not cover it and it is very expensive.  She was encouraged to follow back up to discuss other treatment options.  We discussed  that the obstructive sleep apnea is likely contributing to her significant daytime drowsiness.  Other fatigue: She continues to have significant fatigue on a daily basis.  She continues to have chronic insomnia.  She has tried several sleep aids in the past.  She underwent a home sleep test on 04/19/2020 and was diagnosed with obstructive sleep apnea.  She could not tolerate using a CPAP machine in the past.  She was advised to follow back up to discuss other treatment options to try to improve her daytime fatigue and drowsiness.  We also discussed the importance of regular exercise and good sleep hygiene.   Primary osteoarthritis of both hands: She has PIP and DIP thickening consistent with osteoarthritis of both hands.  No inflammation was noted on exam.  She has complete fist formation bilaterally.  Discussed the importance of joint protection and muscle strengthening.    Trigger thumb, left thumb: She has been experiencing intermittent tenderness and locking.  We discussed the use of Voltaren gel which she can apply topically as well as using a splint.  Primary osteoarthritis of both knees - X-rays of both knees were updated on 12/31/2018.  X-rays were consistent with moderate osteoarthritis and moderate chondromalacia.  Her discomfort is typically exacerbated by stairs.  She has good range of motion of both knee joints on exam today.  No warmth or effusion was noted.  She was advised to notify us if her discomfort persists or worsens.  We also discussed the importance of  lower extremity muscle strengthening and weight loss.  She can continue to use Voltaren gel topically as needed for pain relief.  Osteopenia of multiple sites - DEXA 05/13/18: The BMD measured at Femur Neck Left is 0.793 g/cm2 with a T-score of -1.8.  She has not had any falls or fractures recently. She is taking vitamin D 5000 units daily and calcium daily.  She is due to update DEXA and will further discuss with her upcoming appointment with Dr. Glori Bickers.  History of vitamin D deficiency: She is taking vitamin D 5000 units daily.  History of depression: She continues to take Cymbalta 60 mg 1 capsule by mouth daily.  Other medical conditions are listed as follows:  Interstitial cystitis - She is followed by Dr. Amalia Hailey.   History of IBS  History of hyperglycemia  History of hyperlipidemia  History of hypertension  S/P laparoscopic cholecystectomy June 2017  Memory loss  Orders: No orders of the defined types were placed in this encounter.  No orders of the defined types were placed in this encounter.     Follow-Up Instructions: Return in about 6 months (around 01/02/2021) for Fibromyalgia, Osteoarthritis.   Ofilia Neas, PA-C  Note - This record has been created using Dragon software.  Chart creation errors have been sought, but may not always  have been located. Such creation errors do not reflect on  the standard of medical care.

## 2020-07-03 ENCOUNTER — Other Ambulatory Visit: Payer: Self-pay

## 2020-07-03 ENCOUNTER — Encounter: Payer: Self-pay | Admitting: Physician Assistant

## 2020-07-03 ENCOUNTER — Ambulatory Visit: Payer: Medicare Other | Admitting: Physician Assistant

## 2020-07-03 VITALS — BP 128/83 | HR 69 | Resp 14 | Ht 64.0 in | Wt 185.0 lb

## 2020-07-03 DIAGNOSIS — M19042 Primary osteoarthritis, left hand: Secondary | ICD-10-CM

## 2020-07-03 DIAGNOSIS — Z8639 Personal history of other endocrine, nutritional and metabolic disease: Secondary | ICD-10-CM | POA: Diagnosis not present

## 2020-07-03 DIAGNOSIS — Z8659 Personal history of other mental and behavioral disorders: Secondary | ICD-10-CM

## 2020-07-03 DIAGNOSIS — R5383 Other fatigue: Secondary | ICD-10-CM

## 2020-07-03 DIAGNOSIS — F5101 Primary insomnia: Secondary | ICD-10-CM

## 2020-07-03 DIAGNOSIS — M19041 Primary osteoarthritis, right hand: Secondary | ICD-10-CM

## 2020-07-03 DIAGNOSIS — N3010 Interstitial cystitis (chronic) without hematuria: Secondary | ICD-10-CM

## 2020-07-03 DIAGNOSIS — M8589 Other specified disorders of bone density and structure, multiple sites: Secondary | ICD-10-CM

## 2020-07-03 DIAGNOSIS — R413 Other amnesia: Secondary | ICD-10-CM

## 2020-07-03 DIAGNOSIS — Z9049 Acquired absence of other specified parts of digestive tract: Secondary | ICD-10-CM | POA: Diagnosis not present

## 2020-07-03 DIAGNOSIS — M170 Bilateral primary osteoarthritis of knee: Secondary | ICD-10-CM

## 2020-07-03 DIAGNOSIS — M797 Fibromyalgia: Secondary | ICD-10-CM | POA: Diagnosis not present

## 2020-07-03 DIAGNOSIS — Z8679 Personal history of other diseases of the circulatory system: Secondary | ICD-10-CM

## 2020-07-03 DIAGNOSIS — Z8719 Personal history of other diseases of the digestive system: Secondary | ICD-10-CM

## 2020-07-03 DIAGNOSIS — M65312 Trigger thumb, left thumb: Secondary | ICD-10-CM

## 2020-07-03 NOTE — Patient Instructions (Signed)
Hand Exercises Hand exercises can be helpful for almost anyone. These exercises can strengthen the hands, improve flexibility and movement, and increase blood flow to the hands. These results can make work and daily tasks easier. Hand exercises can be especially helpful for people who have joint pain from arthritis or have nerve damage from overuse (carpal tunnel syndrome). These exercises can also help people who have injured a hand. Exercises Most of these hand exercises are gentle stretching and motion exercises. It is usually safe to do them often throughout the day. Warming up your hands before exercise may help to reduce stiffness. You can do this with gentle massage or by placing your hands in warm water for 10-15 minutes. It is normal to feel some stretching, pulling, tightness, or mild discomfort as you begin new exercises. This will gradually improve. Stop an exercise right away if you feel sudden, severe pain or your pain gets worse. Ask your health care provider which exercises are best for you. Knuckle bend or "claw" fist 1. Stand or sit with your arm, hand, and all five fingers pointed straight up. Make sure to keep your wrist straight during the exercise. 2. Gently bend your fingers down toward your palm until the tips of your fingers are touching the top of your palm. Keep your big knuckle straight and just bend the small knuckles in your fingers. 3. Hold this position for __________ seconds. 4. Straighten (extend) your fingers back to the starting position. Repeat this exercise 5-10 times with each hand. Full finger fist 1. Stand or sit with your arm, hand, and all five fingers pointed straight up. Make sure to keep your wrist straight during the exercise. 2. Gently bend your fingers into your palm until the tips of your fingers are touching the middle of your palm. 3. Hold this position for __________ seconds. 4. Extend your fingers back to the starting position, stretching every  joint fully. Repeat this exercise 5-10 times with each hand. Straight fist 1. Stand or sit with your arm, hand, and all five fingers pointed straight up. Make sure to keep your wrist straight during the exercise. 2. Gently bend your fingers at the big knuckle, where your fingers meet your hand, and the middle knuckle. Keep the knuckle at the tips of your fingers straight and try to touch the bottom of your palm. 3. Hold this position for __________ seconds. 4. Extend your fingers back to the starting position, stretching every joint fully. Repeat this exercise 5-10 times with each hand. Tabletop 1. Stand or sit with your arm, hand, and all five fingers pointed straight up. Make sure to keep your wrist straight during the exercise. 2. Gently bend your fingers at the big knuckle, where your fingers meet your hand, as far down as you can while keeping the small knuckles in your fingers straight. Think of forming a tabletop with your fingers. 3. Hold this position for __________ seconds. 4. Extend your fingers back to the starting position, stretching every joint fully. Repeat this exercise 5-10 times with each hand. Finger spread 1. Place your hand flat on a table with your palm facing down. Make sure your wrist stays straight as you do this exercise. 2. Spread your fingers and thumb apart from each other as far as you can until you feel a gentle stretch. Hold this position for __________ seconds. 3. Bring your fingers and thumb tight together again. Hold this position for __________ seconds. Repeat this exercise 5-10 times with each hand.   Making circles 1. Stand or sit with your arm, hand, and all five fingers pointed straight up. Make sure to keep your wrist straight during the exercise. 2. Make a circle by touching the tip of your thumb to the tip of your index finger. 3. Hold for __________ seconds. Then open your hand wide. 4. Repeat this motion with your thumb and each finger on your  hand. Repeat this exercise 5-10 times with each hand. Thumb motion 1. Sit with your forearm resting on a table and your wrist straight. Your thumb should be facing up toward the ceiling. Keep your fingers relaxed as you move your thumb. 2. Lift your thumb up as high as you can toward the ceiling. Hold for __________ seconds. 3. Bend your thumb across your palm as far as you can, reaching the tip of your thumb for the small finger (pinkie) side of your palm. Hold for __________ seconds. Repeat this exercise 5-10 times with each hand. Grip strengthening 1. Hold a stress ball or other soft ball in the middle of your hand. 2. Slowly increase the pressure, squeezing the ball as much as you can without causing pain. Think of bringing the tips of your fingers into the middle of your palm. All of your finger joints should bend when doing this exercise. 3. Hold your squeeze for __________ seconds, then relax. Repeat this exercise 5-10 times with each hand.   Contact a health care provider if:  Your hand pain or discomfort gets much worse when you do an exercise.  Your hand pain or discomfort does not improve within 2 hours after you exercise. If you have any of these problems, stop doing these exercises right away. Do not do them again unless your health care provider says that you can. Get help right away if:  You develop sudden, severe hand pain or swelling. If this happens, stop doing these exercises right away. Do not do them again unless your health care provider says that you can. This information is not intended to replace advice given to you by your health care provider. Make sure you discuss any questions you have with your health care provider. Document Revised: 07/09/2018 Document Reviewed: 03/19/2018 Elsevier Patient Education  2021 [place] for Nurse Practitioners, 15(4), [phone]. Retrieved January 05, 2018 from http://clinicalkey.com/nursing">  Knee Exercises Ask your  health care provider which exercises are safe for you. Do exercises exactly as told by your health care provider and adjust them as directed. It is normal to feel mild stretching, pulling, tightness, or discomfort as you do these exercises. Stop right away if you feel sudden pain or your pain gets worse. Do not begin these exercises until told by your health care provider. Stretching and range-of-motion exercises These exercises warm up your muscles and joints and improve the movement and flexibility of your knee. These exercises also help to relieve pain and swelling. Knee extension, prone 1. Lie on your abdomen (prone position) on a bed. 2. Place your left / right knee just beyond the edge of the surface so your knee is not on the bed. You can put a towel under your left / right thigh just above your kneecap for comfort. 3. Relax your leg muscles and allow gravity to straighten your knee (extension). You should feel a stretch behind your left / right knee. 4. Hold this position for __________ seconds. 5. Scoot up so your knee is supported between repetitions. Repeat __________ times. Complete this exercise __________ times a day.  Knee flexion, active 1. Lie on your back with both legs straight. If this causes back discomfort, bend your left / right knee so your foot is flat on the floor. 2. Slowly slide your left / right heel back toward your buttocks. Stop when you feel a gentle stretch in the front of your knee or thigh (flexion). 3. Hold this position for __________ seconds. 4. Slowly slide your left / right heel back to the starting position. Repeat __________ times. Complete this exercise __________ times a day.   Quadriceps stretch, prone 1. Lie on your abdomen on a firm surface, such as a bed or padded floor. 2. Bend your left / right knee and hold your ankle. If you cannot reach your ankle or pant leg, loop a belt around your foot and grab the belt instead. 3. Gently pull your heel  toward your buttocks. Your knee should not slide out to the side. You should feel a stretch in the front of your thigh and knee (quadriceps). 4. Hold this position for __________ seconds. Repeat __________ times. Complete this exercise __________ times a day.   Hamstring, supine 1. Lie on your back (supine position). 2. Loop a belt or towel over the ball of your left / right foot. The ball of your foot is on the walking surface, right under your toes. 3. Straighten your left / right knee and slowly pull on the belt to raise your leg until you feel a gentle stretch behind your knee (hamstring). ? Do not let your knee bend while you do this. ? Keep your other leg flat on the floor. 4. Hold this position for __________ seconds. Repeat __________ times. Complete this exercise __________ times a day. Strengthening exercises These exercises build strength and endurance in your knee. Endurance is the ability to use your muscles for a long time, even after they get tired. Quadriceps, isometric This exercise stretches the muscles in front of your thigh (quadriceps) without moving your knee joint (isometric). 1. Lie on your back with your left / right leg extended and your other knee bent. Put a rolled towel or small pillow under your knee if told by your health care provider. 2. Slowly tense the muscles in the front of your left / right thigh. You should see your kneecap slide up toward your hip or see increased dimpling just above the knee. This motion will push the back of the knee toward the floor. 3. For __________ seconds, hold the muscle as tight as you can without increasing your pain. 4. Relax the muscles slowly and completely. Repeat __________ times. Complete this exercise __________ times a day.   Straight leg raises This exercise stretches the muscles in front of your thigh (quadriceps) and the muscles that move your hips (hip flexors). 1. Lie on your back with your left / right leg extended  and your other knee bent. 2. Tense the muscles in the front of your left / right thigh. You should see your kneecap slide up or see increased dimpling just above the knee. Your thigh may even shake a bit. 3. Keep these muscles tight as you raise your leg 4-6 inches (10-15 cm) off the floor. Do not let your knee bend. 4. Hold this position for __________ seconds. 5. Keep these muscles tense as you lower your leg. 6. Relax your muscles slowly and completely after each repetition. Repeat __________ times. Complete this exercise __________ times a day. Hamstring, isometric 1. Lie on your back on a firm  surface. 2. Bend your left / right knee about __________ degrees. 3. Dig your left / right heel into the surface as if you are trying to pull it toward your buttocks. Tighten the muscles in the back of your thighs (hamstring) to "dig" as hard as you can without increasing any pain. 4. Hold this position for __________ seconds. 5. Release the tension gradually and allow your muscles to relax completely for __________ seconds after each repetition. Repeat __________ times. Complete this exercise __________ times a day. Hamstring curls If told by your health care provider, do this exercise while wearing ankle weights. Begin with __________ lb weights. Then increase the weight by 1 lb (0.5 kg) increments. Do not wear ankle weights that are more than __________ lb. 1. Lie on your abdomen with your legs straight. 2. Bend your left / right knee as far as you can without feeling pain. Keep your hips flat against the floor. 3. Hold this position for __________ seconds. 4. Slowly lower your leg to the starting position. Repeat __________ times. Complete this exercise __________ times a day.   Squats This exercise strengthens the muscles in front of your thigh and knee (quadriceps). 1. Stand in front of a table, with your feet and knees pointing straight ahead. You may rest your hands on the table for balance  but not for support. 2. Slowly bend your knees and lower your hips like you are going to sit in a chair. ? Keep your weight over your heels, not over your toes. ? Keep your lower legs upright so they are parallel with the table legs. ? Do not let your hips go lower than your knees. ? Do not bend lower than told by your health care provider. ? If your knee pain increases, do not bend as low. 3. Hold the squat position for __________ seconds. 4. Slowly push with your legs to return to standing. Do not use your hands to pull yourself to standing. Repeat __________ times. Complete this exercise __________ times a day. Wall slides This exercise strengthens the muscles in front of your thigh and knee (quadriceps). 1. Lean your back against a smooth wall or door, and walk your feet out 18-24 inches (46-61 cm) from it. 2. Place your feet hip-width apart. 3. Slowly slide down the wall or door until your knees bend __________ degrees. Keep your knees over your heels, not over your toes. Keep your knees in line with your hips. 4. Hold this position for __________ seconds. Repeat __________ times. Complete this exercise __________ times a day.   Straight leg raises This exercise strengthens the muscles that rotate the leg at the hip and move it away from your body (hip abductors). 1. Lie on your side with your left / right leg in the top position. Lie so your head, shoulder, knee, and hip line up. You may bend your bottom knee to help you keep your balance. 2. Roll your hips slightly forward so your hips are stacked directly over each other and your left / right knee is facing forward. 3. Leading with your heel, lift your top leg 4-6 inches (10-15 cm). You should feel the muscles in your outer hip lifting. ? Do not let your foot drift forward. ? Do not let your knee roll toward the ceiling. 4. Hold this position for __________ seconds. 5. Slowly return your leg to the starting position. 6. Let your  muscles relax completely after each repetition. Repeat __________ times. Complete this exercise __________  times a day.   Straight leg raises This exercise stretches the muscles that move your hips away from the front of the pelvis (hip extensors). 1. Lie on your abdomen on a firm surface. You can put a pillow under your hips if that is more comfortable. 2. Tense the muscles in your buttocks and lift your left / right leg about 4-6 inches (10-15 cm). Keep your knee straight as you lift your leg. 3. Hold this position for __________ seconds. 4. Slowly lower your leg to the starting position. 5. Let your leg relax completely after each repetition. Repeat __________ times. Complete this exercise __________ times a day. This information is not intended to replace advice given to you by your health care provider. Make sure you discuss any questions you have with your health care provider. Document Revised: 01/06/2018 Document Reviewed: 01/06/2018 Elsevier Patient Education  2021 [employer].

## 2020-07-20 DIAGNOSIS — M542 Cervicalgia: Secondary | ICD-10-CM | POA: Diagnosis not present

## 2020-07-20 DIAGNOSIS — M791 Myalgia, unspecified site: Secondary | ICD-10-CM | POA: Diagnosis not present

## 2020-07-20 DIAGNOSIS — G518 Other disorders of facial nerve: Secondary | ICD-10-CM | POA: Diagnosis not present

## 2020-07-20 DIAGNOSIS — G43719 Chronic migraine without aura, intractable, without status migrainosus: Secondary | ICD-10-CM | POA: Diagnosis not present

## 2020-08-14 DIAGNOSIS — N3010 Interstitial cystitis (chronic) without hematuria: Secondary | ICD-10-CM | POA: Diagnosis not present

## 2020-08-14 DIAGNOSIS — R3915 Urgency of urination: Secondary | ICD-10-CM | POA: Diagnosis not present

## 2020-08-14 DIAGNOSIS — K582 Mixed irritable bowel syndrome: Secondary | ICD-10-CM | POA: Diagnosis not present

## 2020-08-14 DIAGNOSIS — G8929 Other chronic pain: Secondary | ICD-10-CM | POA: Diagnosis not present

## 2020-08-14 DIAGNOSIS — R5382 Chronic fatigue, unspecified: Secondary | ICD-10-CM | POA: Diagnosis not present

## 2020-08-14 DIAGNOSIS — R1030 Lower abdominal pain, unspecified: Secondary | ICD-10-CM | POA: Diagnosis not present

## 2020-08-14 DIAGNOSIS — R350 Frequency of micturition: Secondary | ICD-10-CM | POA: Diagnosis not present

## 2020-08-15 DIAGNOSIS — G4721 Circadian rhythm sleep disorder, delayed sleep phase type: Secondary | ICD-10-CM | POA: Diagnosis not present

## 2020-08-15 DIAGNOSIS — G4711 Idiopathic hypersomnia with long sleep time: Secondary | ICD-10-CM | POA: Diagnosis not present

## 2020-08-15 DIAGNOSIS — G4733 Obstructive sleep apnea (adult) (pediatric): Secondary | ICD-10-CM | POA: Diagnosis not present

## 2020-08-25 ENCOUNTER — Telehealth: Payer: Self-pay | Admitting: Family Medicine

## 2020-08-25 NOTE — Chronic Care Management (AMB) (Signed)
  Chronic Care Management   Note  08/25/2020 Name: Lindsey [name] MRN: [id number] DOB: Jun 26, 1955  Lindsey [name] is a 65 y.o. year old female who is a primary care patient of Tower, Wynelle Fanny, MD. I reached out to Imelda Pillow by phone today in response to a referral sent by [name]'s PCP, Tower, Wynelle Fanny, MD.   Lindsey [name] was given information about Chronic Care Management services today including:  1. CCM service includes personalized support from designated clinical staff supervised by her physician, including individualized plan of care and coordination with other care providers 2. 24/7 contact phone numbers for assistance for urgent and routine care needs. 3. Service will only be billed when office clinical staff spend 20 minutes or more in a month to coordinate care. 4. Only one practitioner may furnish and bill the service in a calendar month. 5. The patient may stop CCM services at any time (effective at the end of the month) by phone call to the office staff.   Patient agreed to services and verbal consent obtained.   Follow up plan:   Tatjana Secretary/administrator

## 2020-09-11 DIAGNOSIS — G4733 Obstructive sleep apnea (adult) (pediatric): Secondary | ICD-10-CM | POA: Diagnosis not present

## 2020-09-12 DIAGNOSIS — G4733 Obstructive sleep apnea (adult) (pediatric): Secondary | ICD-10-CM | POA: Diagnosis not present

## 2020-09-22 ENCOUNTER — Other Ambulatory Visit: Payer: Self-pay | Admitting: Physician Assistant

## 2020-09-22 NOTE — Telephone Encounter (Signed)
Next Visit: 01/02/2021  Last Visit: 07/03/2020  Last Fill:06/14/2020  Dx:  Fibromyalgia  Current Dose per office note on 07/03/2020: Cymbalta 60 mg 1 capsule by mouth daily  Okay to refill Cymbalta?

## 2020-09-26 ENCOUNTER — Other Ambulatory Visit: Payer: Self-pay | Admitting: Family Medicine

## 2020-09-26 DIAGNOSIS — Z1231 Encounter for screening mammogram for malignant neoplasm of breast: Secondary | ICD-10-CM

## 2020-10-10 DIAGNOSIS — H47323 Drusen of optic disc, bilateral: Secondary | ICD-10-CM | POA: Diagnosis not present

## 2020-10-11 ENCOUNTER — Telehealth: Payer: Self-pay

## 2020-10-11 NOTE — Progress Notes (Addendum)
Chronic Care Management Pharmacy Assistant   Name: Lindsey [name]  MRN: [id number] DOB: 1956-03-17  Reason for Encounter: Initial Questions and Chart Review for 10/26/20 CCM Appointment  Recent office visits:  06/07/20- PCP- Telephone encounter. Following elevated LDL of 150 Dr. Glori Bickers started patient on Crestor 5 mg twice a week, recheck lipids in 4-6 weeks. Will continue monitoring TSH and will likely need levothyroxine in future.   Recent consult visits:  09/15/20- Otolaryngology- No appointment data available.  08/14/20- Urology- No appointment data available.  07/03/20- Rheumatology- Discussed interrupted sleep but unable to use CPAP. Due for DEXA recommended to discuss with Dr. Glori Bickers. Follow up in 6 months.  05/15/20- Urology- severe interstitial cystitis. SNRI was prescribed (Cymbalta or Savella). Hydroxyzine was initiated. One a day antibiotics were prescribed for UTI prevention. Pain management program with narcotics was initiated 04/27/20- Neurology- Telephone encounter- Advised patient she would need CPAP, patient declined requested dental device. Referred to Dr. Toy Cookey for evaluation and treatment of CPAP 04/20/20- Neurology- Headache. No appointment data available.  04/19/20- Sleep med- Diagnosed with obstructive sleep apnea following home sleep study.  Hospital visits:  None in previous 6 months  Medications: Outpatient Encounter Medications as of 10/11/2020  Medication Sig   atenolol (TENORMIN) 100 MG tablet TAKE 1 TABLET BY MOUTH EVERY DAY   baclofen (LIORESAL) 20 MG tablet Take 20 mg by mouth daily as needed for muscle spasms.   botulinum toxin Type A (BOTOX) 100 units SOLR injection Inject 200 Units into the muscle once.    calcium carbonate (TUMS - DOSED IN MG ELEMENTAL CALCIUM) 500 MG chewable tablet Chew 1 tablet by mouth daily as needed for indigestion or heartburn.   cholecalciferol (VITAMIN D) 1000 UNITS tablet Take 5,000 Units by mouth daily.    diclofenac sodium  (VOLTAREN) 1 % GEL Apply 2 to 4 gram to affected joints up to 4 times daily   doxycycline (DORYX) 100 MG EC tablet Take 100 mg by mouth 2 (two) times daily.   DULoxetine (CYMBALTA) 60 MG capsule TAKE 1 CAPSULE BY MOUTH EVERY DAY   famotidine (PEPCID) 20 MG tablet Take 20 mg by mouth 2 (two) times daily as needed for heartburn or indigestion.   fentaNYL (DURAGESIC - DOSED MCG/HR) 50 MCG/HR Place 50 mcg onto the skin every other day.   hydrOXYzine (ATARAX/VISTARIL) 50 MG tablet Take 100 mg by mouth at bedtime.   lidocaine (XYLOCAINE) 5 % ointment APPLY TO AFFECTED AREA EVERY DAY AS DIRECTED   LUTEIN PO Take 1 tablet by mouth daily.   rosuvastatin (CRESTOR) 5 MG tablet Take one pill twice weekly by mouth   No facility-administered encounter medications on file as of 10/11/2020.     Lab Results  Component Value Date/Time   HGBA1C 6.1 05/30/2020 11:21 AM   HGBA1C 6.3 11/24/2019 12:05 PM     BP Readings from Last 3 Encounters:  07/03/20 128/83  02/14/20 (!) 158/93  12/30/19 105/70    Patient contacted to review initial questions prior to visit with [name].  Have you seen any other providers since your last visit with PCP? Yes  Any changes in your medications or health? No  Any side effects from any medications? No  Do you have an symptoms or problems not managed by your medications? No  Any concerns about your health right now? Yes - Sleep apnea  Has your provider asked that you check blood pressure, blood sugar, or follow special diet at home? No  Do you  get any type of exercise on a regular basis? No, not active at all. States she in the bed a lot.   Can you think of a goal you would like to reach for your health? No  Do you have any problems getting your medications? No, but does note some of her medications are expensive.  Is there anything that you would like to discuss during the appointment? No   BRITIANY [name] was reminded to have all medications, supplements  and any blood glucose and blood pressure readings available for review with [name], Pharm. D, at her telephone visit on 10/26/20 at 2:00.    Star Rating Drugs:  Medication:  Last Fill: Day Supply Rosuvastatin 5 mg 06/07/20  84  Contacted [facility] to verify fill date, states they filled another prescription of rosuvastatin recently but patient did not pick up.   Care Gaps: Last annual wellness visit: 11/30/19  [name], CPP notified  Margaretmary Dys, [place] Assistant [phone]   I have reviewed the care management and care coordination activities outlined in this encounter and I am certifying that I agree with the content of this note. No further action required.  [name], [name] Clinical Pharmacist [facility] at [facility] [phone]

## 2020-10-19 ENCOUNTER — Telehealth: Payer: Medicare Other

## 2020-10-19 DIAGNOSIS — M542 Cervicalgia: Secondary | ICD-10-CM | POA: Diagnosis not present

## 2020-10-19 DIAGNOSIS — G43719 Chronic migraine without aura, intractable, without status migrainosus: Secondary | ICD-10-CM | POA: Diagnosis not present

## 2020-10-19 DIAGNOSIS — G518 Other disorders of facial nerve: Secondary | ICD-10-CM | POA: Diagnosis not present

## 2020-10-24 DIAGNOSIS — G4733 Obstructive sleep apnea (adult) (pediatric): Secondary | ICD-10-CM | POA: Diagnosis not present

## 2020-10-26 ENCOUNTER — Telehealth: Payer: Self-pay

## 2020-10-26 ENCOUNTER — Other Ambulatory Visit: Payer: Self-pay

## 2020-10-26 ENCOUNTER — Ambulatory Visit (INDEPENDENT_AMBULATORY_CARE_PROVIDER_SITE_OTHER): Payer: Medicare Other

## 2020-10-26 DIAGNOSIS — I10 Essential (primary) hypertension: Secondary | ICD-10-CM

## 2020-10-26 DIAGNOSIS — E7800 Pure hypercholesterolemia, unspecified: Secondary | ICD-10-CM | POA: Diagnosis not present

## 2020-10-26 NOTE — Telephone Encounter (Signed)
Pt brought up a few concerns during CCM visit today  - Rheum recommended DEXA scan. Pt needs a referral. - She would like to follow up with [facility] due to acid reflux symptoms and to schedule colonoscopy. States she needs a referral. Prior saw Dr. Silvano Rusk, but reports its been several years. - She is interested in [place] for weight loss. Sister is doing well on it. No h/o pancreatitis or thyroid cancer. We discussed potential side effects and cost. She is still interested. I let her know I would check with PCP and get back to her.   She is due for AWV in August. She plans to schedule soon.  [name], [name] Clinical Pharmacist [facility] at [facility] [phone]

## 2020-10-26 NOTE — Telephone Encounter (Signed)
Please schedule f/u to discuss all of those things, thanks

## 2020-10-26 NOTE — Progress Notes (Signed)
Chronic Care Management Pharmacy Note  10/26/2020 Name:  Lindsey [name] MRN:  [id number] DOB:  October 25, 1955  Subjective: Lindsey [name] is an 65 y.o. year old female who is a primary patient of Tower, Lindsey Fanny, MD.  The CCM team was consulted for assistance with disease management and care coordination needs.    Engaged with patient by telephone for initial visit in response to provider referral for pharmacy case management and/or care coordination services.  Consent to Services:  The patient was given the following information about Chronic Care Management services today, agreed to services, and gave verbal consent: 1. CCM service includes personalized support from designated clinical staff supervised by the primary care provider, including individualized plan of care and coordination with other care providers 2. 24/7 contact phone numbers for assistance for urgent and routine care needs. 3. Service will only be billed when office clinical staff spend 20 minutes or more in a month to coordinate care. 4. Only one practitioner may furnish and bill the service in a calendar month. 5.The patient may stop CCM services at any time (effective at the end of the month) by phone call to the office staff. 6. The patient will be responsible for cost sharing (co-pay) of up to 20% of the service fee (after annual deductible is met). Patient agreed to services and consent obtained.  Patient Care Team: Tower, Lindsey Fanny, MD as PCP - Claris Gower, [facility] as Pharmacist (Pharmacist)  Recent office visits:  06/07/20- PCP- Telephone encounter. Following elevated LDL of 150 Dr. Glori Bickers started patient on Crestor 5 mg twice a week, recheck lipids in 4-6 weeks. Will continue monitoring TSH and will likely need levothyroxine in future.  11/30/19 - PCP - AWV. Continue current medications.   Recent consult visits:  09/15/20- Otolaryngology- No appointment data available. 08/14/20- Urology- No appointment data  available. 07/03/20- Rheumatology- Followed up fibromyalgia and OA. Discussed interrupted sleep but unable to use CPAP. Due for DEXA recommended to discuss with Dr. Glori Bickers. Follow up in 6 months. 05/15/20- Urology- Severe interstitial cystitis. Continue current medications. 04/27/20- Neurology- Telephone encounter- Advised patient she would need CPAP, patient declined requested dental device due to cost. Referred to Dr. Toy Cookey for evaluation and treatment of CPAP. 04/20/20- Neurology- Headache. No appointment data available.  04/19/20- Sleep med- Diagnosed with obstructive sleep apnea following home sleep study.   Hospital visits:  None in previous 6 months   Objective:  Lab Results  Component Value Date   CREATININE 0.89 11/24/2019   BUN 10 11/24/2019   GFR 64.04 11/24/2019   GFRNONAA 79.88 05/17/2009   GFRAA 98 04/23/2007   NA 134 (L) 11/24/2019   K 4.9 11/24/2019   CALCIUM 9.8 11/24/2019   CO2 32 11/24/2019   GLUCOSE 128 (H) 11/24/2019    Lab Results  Component Value Date/Time   HGBA1C 6.1 05/30/2020 11:21 AM   HGBA1C 6.3 11/24/2019 12:05 PM   GFR 64.04 11/24/2019 12:05 PM   GFR 84.84 08/10/2018 12:34 PM    Lab Results  Component Value Date   CHOL 232 (H) 05/30/2020   HDL 51.70 05/30/2020   LDLCALC 150 (H) 05/30/2020   LDLDIRECT 137.4 11/27/2012   TRIG 152.0 (H) 05/30/2020   CHOLHDL 4 05/30/2020    Hepatic Function Latest Ref Rng & Units 11/24/2019 08/10/2018 06/17/2017  Total Protein 6.0 - 8.3 g/dL 6.8 7.0 7.5  Albumin 3.5 - 5.2 g/dL 3.9 4.0 4.0  AST 0 - 37 U/L _0 ALT 0 -  35 U/L _0 Alk Phosphatase 39 - 117 U/L 80 74 66  Total Bilirubin 0.2 - 1.2 mg/dL 0.5 0.5 0.4  Bilirubin, Direct 0.0 - 0.3 mg/dL - - -    Lab Results  Component Value Date/Time   TSH 6.98 (H) 05/30/2020 11:21 AM   TSH 6.96 (H) 11/24/2019 12:05 PM   FREET4 0.64 05/30/2020 11:21 AM    CBC Latest Ref Rng & Units 11/24/2019 08/10/2018 06/17/2017  WBC 4.0 - 10.5 K/uL 6.1 6.1 6.1   Hemoglobin 12.0 - 15.0 g/dL 14.2 14.4 14.0  Hematocrit 36.0 - 46.0 % 42.6 42.8 42.3  Platelets 150.0 - 400.0 K/uL 206.0 176.0 213.0    Lab Results  Component Value Date/Time   VD25OH 40.63 11/24/2019 12:05 PM   VD25OH 46.86 08/10/2018 12:34 PM    Clinical ASCVD: No  The 10-year ASCVD risk score Mikey Bussing DC Jr., et al., 2013) is: 12.5%   Values used to calculate the score:     Age: 83 years     Sex: Female     Is Non-Hispanic African American: No     Diabetic: No     Tobacco smoker: No     Systolic Blood Pressure: 096 mmHg     Is BP treated: Yes     HDL Cholesterol: 51.7 mg/dL     Total Cholesterol: 232 mg/dL    Depression screen [facility] 2/9 11/30/2019 08/25/2019 08/07/2018  Decreased Interest 3 0 0  Down, Depressed, Hopeless 1 0 0  PHQ - 2 Score 4 0 0  Altered sleeping 3 0 0  Tired, decreased energy 3 0 0  Change in appetite 3 0 0  Feeling bad or failure about yourself  3 0 0  Trouble concentrating 3 0 0  Moving slowly or fidgety/restless 1 0 0  Suicidal thoughts 0 0 0  PHQ-9 Score 20 0 0  Difficult doing work/chores Extremely dIfficult Not difficult at all Not difficult at all    Social History   Tobacco Use  Smoking Status Never  Smokeless Tobacco Never   BP Readings from Last 3 Encounters:  07/03/20 128/83  02/14/20 (!) 158/93  12/30/19 105/70   Pulse Readings from Last 3 Encounters:  07/03/20 69  02/14/20 67  12/30/19 63   Wt Readings from Last 3 Encounters:  07/03/20 185 lb (83.9 kg)  02/14/20 184 lb (83.5 kg)  12/30/19 185 lb (83.9 kg)   BMI Readings from Last 3 Encounters:  07/03/20 31.76 kg/m  02/14/20 32.08 kg/m  12/30/19 32.26 kg/m    Assessment/Interventions: Review of patient past medical history, allergies, medications, health status, including review of consultants reports, laboratory and other test data, was performed as part of comprehensive evaluation and provision of chronic care management services.   SDOH:  (Social Determinants of  Health) assessments and interventions performed: Yes SDOH Interventions    Flowsheet Row Most Recent Value  SDOH Interventions   Financial Strain Interventions Intervention Not Indicated      SDOH Screenings   Alcohol Screen: Not on file  Depression (PHQ2-9): Medium Risk   PHQ-2 Score: 20  Financial Resource Strain: Low Risk    Difficulty of Paying Living Expenses: Not very hard  Food Insecurity: Not on file  Housing: Not on file  Physical Activity: Not on file  Social Connections: Not on file  Stress: Not on file  Tobacco Use: Low Risk    Smoking Tobacco Use: Never   Smokeless Tobacco Use: Never  Transportation Needs: Not on  file    CCM Care Plan  Allergies  Allergen Reactions   Buspirone Hcl     REACTION: dystonia   Simvastatin     REACTION: malaise   Sulfa Antibiotics     nausea    Medications Reviewed Today     Reviewed by [name], [facility] (Pharmacist) on 10/26/20 at 1443  Med List Status: <None>   Medication Order Taking? Sig Documenting Provider Last Dose Status Informant  atenolol (TENORMIN) 100 MG tablet [id number] Yes TAKE 1 TABLET BY MOUTH EVERY DAY Tower, Lindsey Fanny, MD Taking Active   baclofen (LIORESAL) 20 MG tablet [id number] Yes Take 20 mg by mouth daily as needed for muscle spasms. [provider] Taking Active   botulinum toxin Type A (BOTOX) 100 units SOLR injection [id number] Yes Inject 200 Units into the muscle once.  [provider] Taking Active   calcium carbonate (TUMS - DOSED IN MG ELEMENTAL CALCIUM) 500 MG chewable tablet [id number] Yes Chew 1 tablet by mouth daily as needed for indigestion or heartburn. [provider] Taking Active   cholecalciferol (VITAMIN D) 1000 UNITS tablet [id number] Yes Take 5,000 Units by mouth daily.  [provider] Taking Active Self  diclofenac sodium (VOLTAREN) 1 % GEL [id number] Yes Apply 2 to 4 gram to affected joints up to 4 times daily Bo Merino, MD Taking Active    DULoxetine (CYMBALTA) 60 MG capsule [id number] Yes TAKE 1 CAPSULE BY MOUTH EVERY DAY Ofilia Neas, PA-C Taking Active   famotidine (PEPCID) 20 MG tablet [id number] Yes Take 20 mg by mouth 2 (two) times daily as needed for heartburn or indigestion. [provider] Taking Active   fentaNYL (DURAGESIC - DOSED MCG/HR) 50 MCG/HR [id number] Yes Place 50 mcg onto the skin every other day. [provider] Taking Active Self  hydrOXYzine (ATARAX/VISTARIL) 50 MG tablet [id number] Yes Take 100 mg by mouth at bedtime. [provider] Taking Active   lidocaine (XYLOCAINE) 5 % ointment [id number] Yes APPLY TO AFFECTED AREA EVERY DAY AS DIRECTED [provider] Taking Active   LUTEIN PO [id number] Yes Take 1 tablet by mouth daily. [provider] Taking Active   nitrofurantoin (MACRODANTIN) 50 MG capsule [id number] Yes Take 50 mg by mouth at bedtime. [provider] Taking Active   polyethylene glycol powder (GLYCOLAX/MIRALAX) 17 GM/SCOOP powder [id number] Yes Take by mouth. [provider] Taking Active   rosuvastatin (CRESTOR) 5 MG tablet [id number] Yes Take one pill twice weekly by mouth Tower, Lindsey Fanny, MD Taking Active             Patient Active Problem List   Diagnosis Date Noted   Elevated TSH 05/29/2020   Prediabetes 08/09/2018   Estrogen deficiency 01/28/2018   Autoimmune disease ([place]) 06/18/2017   History of recurrent cystitis (IC) 08/16/2016   Pelvic floor dysfunction 08/16/2016   Dysuria 05/24/2016   Macular degeneration 04/09/2016   Osteoarthritis, knee 02/15/2016   S/P laparoscopic cholecystectomy June 2017 09/26/2015   Dermatitis 10/31/2014   Encounter for Medicare annual wellness exam 09/04/2012   HSV (herpes simplex virus) infection 04/08/2012   IBS (irritable bowel syndrome) 10/21/2011   Routine general medical examination at a health care facility 07/02/2011   Osteopenia 05/17/2009   Depressed mood 10/05/2008   GERD  05/05/2008   OBSTRUCTIVE SLEEP APNEA 06/25/2007   Hyperlipidemia 05/31/2007   Essential hypertension 05/31/2007   Other fatigue 04/23/2007   Vitamin D deficiency 01/22/2007   Migraine headache 01/22/2007   Interstitial cystitis 01/22/2007  MENOPAUSE-RELATED VASOMOTOR SYMPTOMS 01/22/2007   OSTEOARTHROSIS, GENERALIZED, MULTIPLE SITES 01/22/2007   Fibromyalgia 01/22/2007   PATENT FORAMEN OVALE 01/22/2007    Immunization History  Administered Date(s) Administered   Influenza Inj Mdck Quad Pf 12/16/2017   Influenza Whole 12/31/2006   Influenza,inj,Quad PF,6+ Mos 12/24/2013, 02/20/2016, 10/30/2016, 11/30/2016, 11/26/2018   PFIZER(Purple Top)SARS-COV-2 Vaccination 06/18/2019, 07/13/2019, 03/06/2020   Pneumococcal Polysaccharide-23 09/04/2012   Td 04/23/2007   Tdap 06/17/2017    Conditions to be addressed/monitored:  Hypertension, Hyperlipidemia, and GERD, Migraines  Care Plan : [place]  Updates made by [name], [place] since 10/26/2020 12:00 AM     Problem: CHL AMB "PATIENT-SPECIFIC PROBLEM"      Long-Range Goal: Disease Management   Start Date: 10/26/2020  Priority: High  Note:    Current Barriers:  Has not started rosuvastatin   Pharmacist Clinical Goal(s):  Patient will contact provider office for questions/concerns as evidenced notation of same in electronic health record through collaboration with [name] and provider.   Interventions: 1:1 collaboration with Tower, Lindsey Fanny, MD regarding development and update of comprehensive plan of care as evidenced by provider attestation and co-signature Inter-disciplinary care team collaboration (see longitudinal plan of care) Comprehensive medication review performed; medication list updated in electronic medical record  Hypertension (BP goal <140/90) -Controlled - per clinic readings  -Current treatment: Atenolol 100 mg - 1 tablet daily -Medications previously tried: none reported  -Severe sleep apnea -  she is waiting on CPAP 3 month trial and then may try Inspire -Current home readings: none, reports it runs good at doctors appts -Current exercise habits: none -Denies hypotensive/hypertensive symptoms -Considerations - May also help with migraine prevention. Atenolol may contribute to fatigue.  -Educated on BP goals and benefits of medications for prevention of heart attack, stroke and kidney damage; -Recommended to continue current medication  Hyperlipidemia: (LDL goal < 100) -Not ideally controlled - patient has not started statin due to concern about side effects - LDL 151.  Recommend statin therapy to patient. She agrees to start and watch for side effects.  -Considerations: Last TSH mildly elevated (TSH < 10) which may contribute to elevations in cholesterol. -Current treatment: Rosuvastatin 5 mg - twice weekly -Medications previously tried: simvastatin daily - unable to tolerate  -Educated on Cholesterol goals;  Benefits of statin for ASCVD risk reduction; -Recommended : Start rosuvastatin as prescribed.  Fibromyalgia  (Goal: Symptom control) -Controlled - per patient report -Managed by rheumatology -Current treatment: Cymbalta 60 mg - 1 tablet daily Voltaren gel - as needed -Medications previously tried/failed: none -Recommended to continue current medication  Migraines (Goal: Reduce migraine frequency/severity) -Controlled- per patient botox works very well -Managed by neurology -Current treatment  Botox Injections  Baclofen 20 mg - 1 tablet PRN (for bladder or migraine) -Medications previously tried: none reported  -Recommended to continue current medication  Interstitial Cystitis -Managed by urology - pt is satisfied with current therapy -Current treatment: Hydroxyzine 50 mg - 2 tablets (100 mg) at bedtime Fentanyl 50 mcg/hour - One patch every other day -Recommended to continue current medication  GERD -Uncontrolled - frequent symptoms unresolved with Pepcid  and Tums -Current treatment  Pepcid 20 mg - 1 tablet twice daily Tums - as needed -Medications previously tried: PPI - concerned about side effects -Worse when she lays down, regurgitation in middle of night  -She would like to follow up with [facility] - may need a referral -Due for colonoscopy  -Recommended to continue current medication - will request  GI referral from PCP  Patient Goals/Self-Care Activities Patient will:  - take medications as prescribed  Follow Up Plan: Telephone follow up appointment with care management team member scheduled for:  12 months  Other - Patient reports weight is up to 190 lbs. Her sister is doing well on Ozempic for weight loss. She is interested in PCP opinion. She must lose weight to get the [facility] for sleep apnea so this is a motivating factor.       Medication Assistance: None required.  Patient affirms current coverage meets needs.  Compliance/Adherence/Medication fill history: Care Gaps: She has appt for mammogram next month. She plans to schedule colonoscopy soon. She requests a DEXA scan referral.  Star-Rating Drugs: Medication:                Last Fill:         Day Supply Rosuvastatin 5 mg      06/07/20              84  Patient has not been taking rosuvastatin.  Patient's preferred pharmacy is:  [id number] [place]Phone: [phone]: [phone] [address]Phone: [phone]: [phone] [id number] [place]: [phone]: [phone] pill box? No- counts everything out in the mornings Pt endorses 100% compliance  We discussed: Benefits of medication synchronization, packaging and delivery as well as enhanced pharmacist oversight with  [employer]. Patient decided to: Continue current medication management strategy  Care Plan and Follow Up Patient Decision:  Patient agrees to Care Plan and Follow-up.  MiDebbora DusPharmD Clinical Pharmacist [phone]

## 2020-10-26 NOTE — Patient Instructions (Signed)
October 26, 2020  Dear Lindsey [name],  It was a pleasure meeting you during our initial appointment on October 26, 2020. Below is a summary of the goals we discussed and components of chronic care management. Please contact me anytime with questions or concerns.   Visit Information  Patient Care Plan: CCM Pharmacy Care Plan     Problem Identified: CHL AMB "PATIENT-SPECIFIC PROBLEM"      Long-Range Goal: Disease Management   Start Date: 10/26/2020  Priority: High  Note:    Current Barriers:  Has not started rosuvastatin   Pharmacist Clinical Goal(s):  Patient will contact provider office for questions/concerns as evidenced notation of same in electronic health record through collaboration with [name] and provider.   Interventions: 1:1 collaboration with Tower, Wynelle Fanny, MD regarding development and update of comprehensive plan of care as evidenced by provider attestation and co-signature Inter-disciplinary care team collaboration (see longitudinal plan of care) Comprehensive medication review performed; medication list updated in electronic medical record  Hypertension (BP goal <140/90) -Controlled - per clinic readings  -Current treatment: Atenolol 100 mg - 1 tablet daily -Medications previously tried: none reported  -Severe sleep apnea - she is waiting on CPAP 3 month trial and then may try Inspire -Current home readings: none, reports it runs good at doctors appts -Current exercise habits: none -Denies hypotensive/hypertensive symptoms -Considerations - May also help with migraine prevention. Atenolol may contribute to fatigue.  -Educated on BP goals and benefits of medications for prevention of heart attack, stroke and kidney damage; -Recommended to continue current medication  Hyperlipidemia: (LDL goal < 100) -Not ideally controlled - patient has not started statin due to concern about side effects - LDL 151.  Recommend statin therapy to patient. She agrees to start and watch  for side effects.  -Considerations: Last TSH mildly elevated (TSH < 10) which may contribute to elevations in cholesterol. -Current treatment: Rosuvastatin 5 mg - twice weekly -Medications previously tried: simvastatin daily - unable to tolerate  -Educated on Cholesterol goals;  Benefits of statin for ASCVD risk reduction; -Recommended : Start rosuvastatin as prescribed.  Fibromyalgia  (Goal: Symptom control) -Controlled - per patient report -Managed by rheumatology -Current treatment: Cymbalta 60 mg - 1 tablet daily Voltaren gel - as needed -Medications previously tried/failed: none -Recommended to continue current medication  Migraines (Goal: Reduce migraine frequency/severity) -Controlled- per patient botox works very well -Managed by neurology -Current treatment  Botox Injections  Baclofen 20 mg - 1 tablet PRN (for bladder or migraine) -Medications previously tried: none reported  -Recommended to continue current medication  Interstitial Cystitis -Managed by urology - pt is satisfied with current therapy -Current treatment: Hydroxyzine 50 mg - 2 tablets (100 mg) at bedtime Fentanyl 50 mcg/hour - One patch every other day -Recommended to continue current medication  GERD -Uncontrolled - frequent symptoms unresolved with Pepcid and Tums -Current treatment  Pepcid 20 mg - 1 tablet twice daily Tums - as needed -Medications previously tried: PPI - concerned about side effects -Worse when she lays down, regurgitation in middle of night  -She would like to follow up with [facility] - may need a referral -Due for colonoscopy  -Recommended to continue current medication - will request GI referral from PCP  Patient Goals/Self-Care Activities Patient will:  - take medications as prescribed  Follow Up Plan: Telephone follow up appointment with care management team member scheduled for:  12 months  Other - Patient reports weight is up to 190 lbs. Her sister is  doing well on  Ozempic for weight loss. She is interested in PCP opinion. She must lose weight to get the [facility] for sleep apnea so this is a motivating factor.      Lindsey [name] was given information about Chronic Care Management services today including:  CCM service includes personalized support from designated clinical staff supervised by her physician, including individualized plan of care and coordination with other care providers 24/7 contact phone numbers for assistance for urgent and routine care needs. Standard insurance, coinsurance, copays and deductibles apply for chronic care management only during months in which we provide at least 20 minutes of these services. Most insurances cover these services at 100%, however patients may be responsible for any copay, coinsurance and/or deductible if applicable. This service may help you avoid the need for more expensive face-to-face services. Only one practitioner may furnish and bill the service in a calendar month. The patient may stop CCM services at any time (effective at the end of the month) by phone call to the office staff.  Patient agreed to services and verbal consent obtained.   Patient verbalizes understanding of instructions provided today and agrees to view in [place].   [name], [name] Clinical Pharmacist [facility] at [facility] [phone]

## 2020-10-27 NOTE — Telephone Encounter (Signed)
Spoke with patient scheduled follow up appointment

## 2020-10-27 NOTE — Telephone Encounter (Signed)
See PCP's note, please schedule f/u appt to discuss multiple issues

## 2020-11-01 ENCOUNTER — Encounter: Payer: Self-pay | Admitting: Internal Medicine

## 2020-11-06 ENCOUNTER — Ambulatory Visit (INDEPENDENT_AMBULATORY_CARE_PROVIDER_SITE_OTHER): Payer: Medicare Other | Admitting: Family Medicine

## 2020-11-06 ENCOUNTER — Encounter: Payer: Self-pay | Admitting: Family Medicine

## 2020-11-06 ENCOUNTER — Other Ambulatory Visit: Payer: Self-pay

## 2020-11-06 VITALS — BP 124/62 | HR 63 | Temp 97.2°F | Ht 64.0 in | Wt 184.4 lb

## 2020-11-06 DIAGNOSIS — E7800 Pure hypercholesterolemia, unspecified: Secondary | ICD-10-CM | POA: Diagnosis not present

## 2020-11-06 DIAGNOSIS — Z1211 Encounter for screening for malignant neoplasm of colon: Secondary | ICD-10-CM

## 2020-11-06 DIAGNOSIS — G4733 Obstructive sleep apnea (adult) (pediatric): Secondary | ICD-10-CM | POA: Diagnosis not present

## 2020-11-06 DIAGNOSIS — E2839 Other primary ovarian failure: Secondary | ICD-10-CM

## 2020-11-06 DIAGNOSIS — M8589 Other specified disorders of bone density and structure, multiple sites: Secondary | ICD-10-CM | POA: Diagnosis not present

## 2020-11-06 DIAGNOSIS — K219 Gastro-esophageal reflux disease without esophagitis: Secondary | ICD-10-CM

## 2020-11-06 MED ORDER — EZETIMIBE 10 MG PO TABS: 10.0000 mg | ORAL_TABLET | Freq: Every day | ORAL | 3 refills | Status: DC

## 2020-11-06 NOTE — Assessment & Plan Note (Signed)
dexa ordered  occ fall-no injuries No fractures  Taking vit D daily  Exercise limited by chronic pain

## 2020-11-06 NOTE — Assessment & Plan Note (Signed)
Disc goals for lipids and reasons to control them Rev last labs with pt Rev low sat fat diet in detail Intolerant of crestor  Will try zetia instead inst to call if side effects or problems   Re check 6 weeks

## 2020-11-06 NOTE — Progress Notes (Signed)
Subjective:    Patient ID: Lindsey [name], female    DOB: [birth year], 65 y.o.   MRN: [id number]  This visit occurred during the SARS-CoV-2 public health emergency.  Safety protocols were in place, including screening questions prior to the visit, additional usage of staff PPE, and extensive cleaning of exam room while observing appropriate contact time as indicated for disinfecting solutions.   HPI Pt presents to discuss getting dexa, GERD, need for colonoscopy and ozempic for wt loss   Wt Readings from Last 3 Encounters:  11/06/20 184 lb 6 oz (83.6 kg)  07/03/20 185 lb (83.9 kg)  02/14/20 184 lb (83.5 kg)   31.65 kg/m  Last dexa 2/20 followed by rheumatology [facility]  Falls- occasional, no inuruies  Fractures -none Supplements 5000 iu daily Exercise limited by chronic pain   Last colonoscopy 5/12  Due for 10 y recall   Having a bad/worse time with fibromyalgia  Had sleep study done - with snoring  Severe obst sleep apnea  Idiopathic hypersomnia  Delayed sleep phase syndrome  Saw sleep doctor and ENT - disc inspire (would have to go on cpap first) Cpap did not work for her  She has to try it again for 3 months (on back order)  Is hopeful that   Not tolerating cholesterol medicine- crestor 5 once weekly  Bothers her muscle wise-pain  Eats poorly/does not prep food  Wants to try something else    GI problems  Heartburn constantly  Regurgitates acid esp at night  Pepcid 20 mg daily  Antacids  Had EGD years ago- had gastritis  Some constipation   Would not try ozempic right now due to GI trouble    Patient Active Problem List   Diagnosis Date Noted   Colon cancer screening 11/06/2020   Elevated TSH 05/29/2020   Prediabetes 08/09/2018   Estrogen deficiency 01/28/2018   Autoimmune disease ([place]) 06/18/2017   History of recurrent cystitis ([place]) 08/16/2016   Pelvic floor dysfunction 08/16/2016   Dysuria 05/24/2016   Macular degeneration  04/09/2016   Osteoarthritis, knee 02/15/2016   S/P laparoscopic cholecystectomy June 2017 09/26/2015   Dermatitis 10/31/2014   Encounter for Medicare annual wellness exam 09/04/2012   HSV (herpes simplex virus) infection 04/08/2012   IBS (irritable bowel syndrome) 10/21/2011   Routine general medical examination at a health care facility 07/02/2011   Osteopenia 05/17/2009   Depressed mood 10/05/2008   GERD 05/05/2008   OSA (obstructive sleep apnea) 06/25/2007   Hyperlipidemia 05/31/2007   Essential hypertension 05/31/2007   Other fatigue 04/23/2007   Vitamin D deficiency 01/22/2007   Migraine headache 01/22/2007   Interstitial cystitis 01/22/2007   MENOPAUSE-RELATED VASOMOTOR SYMPTOMS 01/22/2007   OSTEOARTHROSIS, GENERALIZED, MULTIPLE SITES 01/22/2007   Fibromyalgia 01/22/2007   PATENT FORAMEN OVALE 01/22/2007   Past Medical History:  Diagnosis Date   Arthritis    Cataract    Chronic fatigue    Early onset macular degeneration    Fibromyalgia    Hemorrhoids    Hyperglycemia 12/24/2013   Hyperlipidemia    Hypersomnia    Hypertension    IBS (irritable bowel syndrome)    Interstitial cystitis    Migraines    Osteopenia    Pelvic floor dysfunction    PFO (patent foramen ovale)    Tendonitis    hand   Vitamin D deficiency    Past Surgical History:  Procedure Laterality Date   ANKLE ARTHROSCOPY Right    ANKLE FRACTURE SURGERY  Left    BLADDER SURGERY     and hydrodistension   BREAST EXCISIONAL BIOPSY Bilateral    Multiple Benign Lumps removed     BREAST LUMPECTOMY     many, bilateral   CERVICAL DISCECTOMY  2006   fusion and plating   CHOLECYSTECTOMY N/A 09/26/2015   Procedure: LAPAROSCOPIC CHOLECYSTECTOMY WITH INTRAOPERATIVE CHOLANGIOGRAM;  Surgeon: Johnathan Hausen, MD;  Location: [facility] ORS;  Service: General;  Laterality: N/A;   COLONOSCOPY     FRACTURE SURGERY     ankle fracture x 2   NASAL SINUS SURGERY     multiple, x4   UPPER GASTROINTESTINAL ENDOSCOPY      VAGINAL HYSTERECTOMY  1990   because of the IC partial   Social History   Tobacco Use   Smoking status: Never   Smokeless tobacco: Never  Vaping Use   Vaping Use: Never used  Substance Use Topics   Alcohol use: No    Alcohol/week: 0.0 standard drinks   Drug use: No   Family History  Problem Relation Age of Onset   Diabetes Father    Alcohol abuse Father    Heart disease Father    Fibromyalgia Mother    Colon cancer Paternal Uncle    Stomach cancer Paternal Grandfather    Diabetes Paternal Grandfather    Diabetes Paternal Grandmother    Stroke Paternal Grandmother    Breast cancer Neg Hx    Allergies  Allergen Reactions   Buspirone Hcl     REACTION: dystonia   Crestor [Rosuvastatin]     Muscle pain     Simvastatin     REACTION: malaise   Sulfa Antibiotics     nausea   Current Outpatient Medications on File Prior to Visit  Medication Sig Dispense Refill   atenolol (TENORMIN) 100 MG tablet TAKE 1 TABLET BY MOUTH EVERY DAY 90 tablet 1   baclofen (LIORESAL) 20 MG tablet Take 20 mg by mouth daily as needed for muscle spasms.     botulinum toxin Type A (BOTOX) 100 units SOLR injection Inject 200 Units into the muscle once.      calcium carbonate (TUMS - DOSED IN MG ELEMENTAL CALCIUM) 500 MG chewable tablet Chew 1 tablet by mouth daily as needed for indigestion or heartburn.     cholecalciferol (VITAMIN D) 1000 UNITS tablet Take 5,000 Units by mouth daily.      diclofenac sodium (VOLTAREN) 1 % GEL Apply 2 to 4 gram to affected joints up to 4 times daily 400 g 2   DULoxetine (CYMBALTA) 60 MG capsule TAKE 1 CAPSULE BY MOUTH EVERY DAY 90 capsule 0   famotidine (PEPCID) 20 MG tablet Take 20 mg by mouth 2 (two) times daily as needed for heartburn or indigestion.     fentaNYL (DURAGESIC - DOSED MCG/HR) 50 MCG/HR Place 50 mcg onto the skin every other day.     hydrOXYzine (ATARAX/VISTARIL) 50 MG tablet Take 100 mg by mouth at bedtime.     lidocaine (XYLOCAINE) 5 % ointment APPLY  TO AFFECTED AREA EVERY DAY AS DIRECTED     LUTEIN PO Take 1 tablet by mouth daily.     nitrofurantoin (MACRODANTIN) 50 MG capsule Take 50 mg by mouth at bedtime.     polyethylene glycol powder (GLYCOLAX/MIRALAX) 17 GM/SCOOP powder Take by mouth.     rosuvastatin (CRESTOR) 5 MG tablet Take one pill twice weekly by mouth 25 tablet 3   No current facility-administered medications on file prior to visit.  Review of Systems  Constitutional:  Positive for fatigue. Negative for activity change, appetite change, fever and unexpected weight change.  HENT:  Negative for congestion, ear pain, rhinorrhea, sinus pressure and sore throat.   Eyes:  Negative for pain, redness and visual disturbance.  Respiratory:  Negative for cough, shortness of breath and wheezing.   Cardiovascular:  Negative for chest pain and palpitations.  Gastrointestinal:  Negative for abdominal pain, blood in stool, constipation and diarrhea.       Heartburn  Endocrine: Negative for polydipsia and polyuria.  Genitourinary:  Positive for dysuria. Negative for frequency and urgency.  Musculoskeletal:  Positive for arthralgias, back pain and myalgias.  Skin:  Negative for pallor and rash.  Allergic/Immunologic: Negative for environmental allergies.  Neurological:  Negative for dizziness, syncope and headaches.  Hematological:  Negative for adenopathy. Does not bruise/bleed easily.  Psychiatric/Behavioral:  Positive for decreased concentration. Negative for dysphoric mood. The patient is not nervous/anxious.       Objective:   Physical Exam Constitutional:      General: She is not in acute distress.    Appearance: Normal appearance. She is well-developed. She is obese. She is not ill-appearing or diaphoretic.  HENT:     Head: Normocephalic and atraumatic.  Eyes:     Conjunctiva/sclera: Conjunctivae normal.     Pupils: Pupils are equal, round, and reactive to light.  Neck:     Thyroid: No thyromegaly.     Vascular: No  carotid bruit or JVD.  Cardiovascular:     Rate and Rhythm: Normal rate and regular rhythm.     Heart sounds: Normal heart sounds.    No gallop.  Pulmonary:     Effort: Pulmonary effort is normal. No respiratory distress.     Breath sounds: Normal breath sounds. No wheezing or rales.  Abdominal:     General: Abdomen is flat. Bowel sounds are normal. There is no distension or abdominal bruit.     Palpations: Abdomen is soft. There is no hepatomegaly, splenomegaly or mass.     Tenderness: There is abdominal tenderness in the epigastric area. There is no guarding or rebound. Negative signs include Murphy's sign and McBurney's sign.  Musculoskeletal:     Cervical back: Normal range of motion and neck supple.     Right lower leg: No edema.     Left lower leg: No edema.     Comments: No kyphosis  Petite frame  Lymphadenopathy:     Cervical: No cervical adenopathy.  Skin:    General: Skin is warm and dry.     Coloration: Skin is not pale.     Findings: No rash.  Neurological:     Mental Status: She is alert.     Coordination: Coordination normal.     Deep Tendon Reflexes: Reflexes are normal and symmetric. Reflexes normal.  Psychiatric:        Mood and Affect: Mood normal.          Assessment & Plan:   Problem List Items Addressed This Visit       Respiratory   OSA (obstructive sleep apnea)    In process of pulm and ENT treatment Looking into inspire device but must try cpap again first  Treatment may help many of her medical problems incl fatigue/pain and brain fog         Digestive   GERD    Really struggling with symptoms lately despite pepcid bid  Would rather stay off ppi if possible  Ref done to gi        Relevant Orders   Ambulatory referral to Gastroenterology     Musculoskeletal and Integument   Osteopenia - Primary    dexa ordered  occ fall-no injuries No fractures  Taking vit D daily  Exercise limited by chronic pain          Other    Hyperlipidemia    Disc goals for lipids and reasons to control them Rev last labs with pt Rev low sat fat diet in detail Intolerant of crestor  Will try zetia instead inst to call if side effects or problems   Re check 6 weeks       Relevant Medications   ezetimibe (ZETIA) 10 MG tablet   Estrogen deficiency   Relevant Orders   DG Bone Density   Colon cancer screening    Due for colonoscopy  GI ref done to discuss this and tx of GERD        Relevant Orders   Ambulatory referral to Gastroenterology

## 2020-11-06 NOTE — Assessment & Plan Note (Signed)
Due for colonoscopy  GI ref done to discuss this and tx of GERD

## 2020-11-06 NOTE — Assessment & Plan Note (Signed)
In process of pulm and ENT treatment Looking into inspire device but must try cpap again first  Treatment may help many of her medical problems incl fatigue/pain and brain fog

## 2020-11-06 NOTE — Assessment & Plan Note (Signed)
Really struggling with symptoms lately despite pepcid bid  Would rather stay off ppi if possible  Ref done to gi

## 2020-11-06 NOTE — Patient Instructions (Addendum)
For cholesterol Avoid red meat/ fried foods/ egg yolks/ fatty breakfast meats/ butter, cheese and high fat dairy/ and shellfish   Try the zetia Let's re check labs in 6 weeks   Schedule bone density test-I put the order in   Take care of yourself   I placed a GI referral-you will get a call   We will need to get GI problems under control before consideration of ozempic or similar medication

## 2020-11-14 DIAGNOSIS — K582 Mixed irritable bowel syndrome: Secondary | ICD-10-CM | POA: Diagnosis not present

## 2020-11-14 DIAGNOSIS — R5382 Chronic fatigue, unspecified: Secondary | ICD-10-CM | POA: Diagnosis not present

## 2020-11-14 DIAGNOSIS — N3010 Interstitial cystitis (chronic) without hematuria: Secondary | ICD-10-CM | POA: Diagnosis not present

## 2020-11-14 DIAGNOSIS — M797 Fibromyalgia: Secondary | ICD-10-CM | POA: Diagnosis not present

## 2020-11-19 ENCOUNTER — Other Ambulatory Visit: Payer: Self-pay | Admitting: Family Medicine

## 2020-11-20 ENCOUNTER — Ambulatory Visit
Admission: RE | Admit: 2020-11-20 | Discharge: 2020-11-20 | Disposition: A | Discharge status: Home / Self Care | Payer: Medicare Other | Source: Ambulatory Visit | Attending: Family Medicine | Admitting: Family Medicine

## 2020-11-20 ENCOUNTER — Other Ambulatory Visit: Payer: Self-pay

## 2020-11-20 DIAGNOSIS — Z1231 Encounter for screening mammogram for malignant neoplasm of breast: Secondary | ICD-10-CM | POA: Diagnosis not present

## 2020-12-06 ENCOUNTER — Telehealth: Payer: Self-pay

## 2020-12-06 NOTE — Progress Notes (Addendum)
Chronic Care Management Pharmacy Assistant   Name: Lindsey [name]  MRN: [id number] DOB: [birth year]   Reason for Encounter: Hyperlipidemia Disease State   Recent office visits:  11/06/2020 - Lindsey Pardon, MD - Patient presented for follow-up for Osteopenia of multiple sites. Stopped: Rosuvastatin. Started: Ezetimibe (ZETIA) 10 MG tablet. Referral to GI placed. Bone Density test ordered. Follow-up labs in 6 weeks.  Recent consult visits:  11/14/2020 - Urology - Patient presented for Chronic Interstitial Cystitis. Started Nitrofurantoin '50mg'$  for UTI prevention. Follow-up in 3 months.   Hospital visits:  None in previous 6 months  Medications: Outpatient Encounter Medications as of 12/06/2020  Medication Sig   atenolol (TENORMIN) 100 MG tablet TAKE 1 TABLET BY MOUTH EVERY DAY   baclofen (LIORESAL) 20 MG tablet Take 20 mg by mouth daily as needed for muscle spasms.   botulinum toxin Type A (BOTOX) 100 units SOLR injection Inject 200 Units into the muscle once.    calcium carbonate (TUMS - DOSED IN MG ELEMENTAL CALCIUM) 500 MG chewable tablet Chew 1 tablet by mouth daily as needed for indigestion or heartburn.   cholecalciferol (VITAMIN D) 1000 UNITS tablet Take 5,000 Units by mouth daily.    diclofenac sodium (VOLTAREN) 1 % GEL Apply 2 to 4 gram to affected joints up to 4 times daily   DULoxetine (CYMBALTA) 60 MG capsule TAKE 1 CAPSULE BY MOUTH EVERY DAY   ezetimibe (ZETIA) 10 MG tablet Take 1 tablet (10 mg total) by mouth daily.   famotidine (PEPCID) 20 MG tablet Take 20 mg by mouth 2 (two) times daily as needed for heartburn or indigestion.   fentaNYL (DURAGESIC - DOSED MCG/HR) 50 MCG/HR Place 50 mcg onto the skin every other day.   hydrOXYzine (ATARAX/VISTARIL) 50 MG tablet Take 100 mg by mouth at bedtime.   lidocaine (XYLOCAINE) 5 % ointment APPLY TO AFFECTED AREA EVERY DAY AS DIRECTED   LUTEIN PO Take 1 tablet by mouth daily.   nitrofurantoin (MACRODANTIN) 50 MG capsule Take 50 mg by  mouth at bedtime.   polyethylene glycol powder (GLYCOLAX/MIRALAX) 17 GM/SCOOP powder Take by mouth.   rosuvastatin (CRESTOR) 5 MG tablet Take one pill twice weekly by mouth   No facility-administered encounter medications on file as of 12/06/2020.    Contacted patient on 12/06/2020 to discuss hyperlipidemia.  Lipid Panel    Component Value Date/Time   CHOL 232 (H) 05/30/2020 1121   TRIG 152.0 (H) 05/30/2020 1121   HDL 51.70 05/30/2020 1121   LDLCALC 150 (H) 05/30/2020 1121   LDLDIRECT 137.4 11/27/2012 1403    10-year ASCVD risk score: The 10-year ASCVD risk score Lindsey [name] DC Lindsey [name]., et al., 2013) is: 6.3%   Values used to calculate the score:     Age: 65 years     Sex: Female     Is Non-Hispanic African American: No     Diabetic: No     Tobacco smoker: No     Systolic Blood Pressure: 123XX123 mmHg     Is BP treated: Yes     HDL Cholesterol: 51.7 mg/dL     Total Cholesterol: 232 mg/dL  Current antihyperlipidemic regimen:   Ezetimibe '10mg'$  - 1 Tablet by mouth daily.  Previous antihyperlipidemic medications tried: Simvastatin and Rosuvastatin daily - unable to tolerate  ASCVD risk enhancing conditions: HTN  What recent interventions/DTPs have been made by any provider to improve Cholesterol control since last CPP Visit: Stopped taking Rosuvastatin on 11/06/2020 due to intolerance. Started Ezetimibe 11/06/2020 90  ds.   Any recent hospitalizations or ED visits since last visit with CPP? No  What diet changes have been made to improve Cholesterol?  Patient states she is doing a little better by not eating ice cream every day and has cut back.   What exercise is being done to improve Cholesterol?  Patient states she has too many issues to exercise.   Adherence Review: Does the patient have >5 day gap between last estimated fill dates? No  Star Rating Drugs Medication Name/Dose: Last fill date Days supply No Star Rating Drugs:  Stopped Rosuvastatin on 11/06/20 due to  intolerance Started Ezetimibe on 11/06/2020  Filled  Lindsey [name], CPP notified  Lindsey [name], [place] Assistant [phone]  I have reviewed the care management and care coordination activities outlined in this encounter and I am certifying that I agree with the content of this note. No further action required.  Lindsey [name], [name] Clinical Pharmacist [facility] [phone]

## 2020-12-19 ENCOUNTER — Other Ambulatory Visit: Payer: Medicare Other

## 2020-12-19 ENCOUNTER — Other Ambulatory Visit: Payer: Self-pay

## 2020-12-19 ENCOUNTER — Other Ambulatory Visit (INDEPENDENT_AMBULATORY_CARE_PROVIDER_SITE_OTHER): Payer: Medicare Other

## 2020-12-19 DIAGNOSIS — E7800 Pure hypercholesterolemia, unspecified: Secondary | ICD-10-CM | POA: Diagnosis not present

## 2020-12-19 LAB — LIPID PANEL
Cholesterol: 194 mg/dL (ref 0–200)
HDL: 51.5 mg/dL (ref >39.00)
LDL Cholesterol: 117 mg/dL — ABNORMAL HIGH (ref 0–99)
NonHDL: 142.42
Total CHOL/HDL Ratio: 4
Triglycerides: 129 mg/dL (ref 0.0–149.0)
VLDL: 25.8 mg/dL (ref 0.0–40.0)

## 2020-12-19 LAB — ALT: ALT: 17 U/L (ref 0–35)

## 2020-12-19 LAB — AST: AST: 14 U/L (ref 0–37)

## 2020-12-19 NOTE — Progress Notes (Signed)
Office Visit Note  Patient: Lindsey [name]             Date of Birth: [birth year]           MRN: [id number]             PCP: Abner Greenspan, MD Referring: Tower, Wynelle Fanny, MD Visit Date: 01/02/2021 Occupation: @GUAROCC @  Subjective:  Pain in both knees   History of Present Illness: Lindsey [name] is a 65 y.o. female with a history of fibromyalgia, osteoarthritis and osteopenia.  She states she has been experiencing increased pain and discomfort in all of her joints and her muscles.  Her knee joints have been hurting more than usual.  She has not noticed any joint swelling.  She has generalized pain from fibromyalgia flare.  She currently has a UTI and having dysuria .  She has chronic constipation from IBS.  Repeat DEXA scan is due next year.  She has noticed intermittent rash on her face and her neck.  Activities of Daily Living:  Patient reports morning stiffness for all day. Patient Reports nocturnal pain.  Difficulty dressing/grooming: Denies Difficulty climbing stairs: Reports Difficulty getting out of chair: Reports Difficulty using hands for taps, buttons, cutlery, and/or writing: Reports  Review of Systems  Constitutional:  Positive for fatigue.  HENT:  Positive for mouth dryness and nose dryness. Negative for mouth sores.   Eyes:  Positive for dryness. Negative for pain and itching.  Respiratory:  Positive for shortness of breath. Negative for difficulty breathing.   Cardiovascular:  Positive for palpitations. Negative for chest pain.  Gastrointestinal:  Positive for constipation. Negative for blood in stool and diarrhea.  Endocrine: Positive for increased urination.  Genitourinary:  Positive for difficulty urinating.  Musculoskeletal:  Positive for joint pain, joint pain, myalgias, morning stiffness, muscle tenderness and myalgias. Negative for joint swelling.  Skin:  Positive for rash. Negative for color change.  Allergic/Immunologic: Positive for susceptible to infections.   Neurological:  Positive for dizziness, headaches, memory loss and weakness. Negative for numbness.  Hematological:  Negative for bruising/bleeding tendency.  Psychiatric/Behavioral:  Positive for confusion.    PMFS History:  Patient Active Problem List   Diagnosis Date Noted   Colon cancer screening 11/06/2020   Elevated TSH 05/29/2020   Prediabetes 08/09/2018   Estrogen deficiency 01/28/2018   Autoimmune disease ([place]) 06/18/2017   History of recurrent cystitis ([place]) 08/16/2016   Pelvic floor dysfunction 08/16/2016   Dysuria 05/24/2016   Macular degeneration 04/09/2016   Osteoarthritis, knee 02/15/2016   S/P laparoscopic cholecystectomy June 2017 09/26/2015   Dermatitis 10/31/2014   Encounter for Medicare annual wellness exam 09/04/2012   HSV (herpes simplex virus) infection 04/08/2012   IBS (irritable bowel syndrome) 10/21/2011   Routine general medical examination at a health care facility 07/02/2011   Osteopenia 05/17/2009   Depressed mood 10/05/2008   GERD 05/05/2008   OSA (obstructive sleep apnea) 06/25/2007   Hyperlipidemia 05/31/2007   Essential hypertension 05/31/2007   Other fatigue 04/23/2007   Vitamin D deficiency 01/22/2007   Migraine headache 01/22/2007   Interstitial cystitis 01/22/2007   MENOPAUSE-RELATED VASOMOTOR SYMPTOMS 01/22/2007   OSTEOARTHROSIS, GENERALIZED, MULTIPLE SITES 01/22/2007   Fibromyalgia 01/22/2007   PATENT FORAMEN OVALE 01/22/2007    Past Medical History:  Diagnosis Date   Arthritis    Cataract    Chronic fatigue    Early onset macular degeneration    Fibromyalgia    Hemorrhoids    Hyperglycemia 12/24/2013   Hyperlipidemia  Hypersomnia    Hypertension    IBS (irritable bowel syndrome)    Interstitial cystitis    Migraines    Osteopenia    Pelvic floor dysfunction    PFO (patent foramen ovale)    Tendonitis    hand   Vitamin D deficiency     Family History  Problem Relation Age of Onset   Diabetes Father    Alcohol  abuse Father    Heart disease Father    Fibromyalgia Mother    Colon cancer Paternal Uncle    Stomach cancer Paternal Grandfather    Diabetes Paternal Grandfather    Diabetes Paternal Grandmother    Stroke Paternal Grandmother    Breast cancer Neg Hx    Past Surgical History:  Procedure Laterality Date   ANKLE ARTHROSCOPY Right    ANKLE FRACTURE SURGERY Left    BLADDER SURGERY     and hydrodistension   BREAST EXCISIONAL BIOPSY Bilateral    Multiple Benign Lumps removed     BREAST LUMPECTOMY     many, bilateral   CERVICAL DISCECTOMY  2006   fusion and plating   CHOLECYSTECTOMY N/A 09/26/2015   Procedure: LAPAROSCOPIC CHOLECYSTECTOMY WITH INTRAOPERATIVE CHOLANGIOGRAM;  Surgeon: Johnathan Hausen, MD;  Location: [facility] ORS;  Service: General;  Laterality: N/A;   COLONOSCOPY     FRACTURE SURGERY     ankle fracture x 2   NASAL SINUS SURGERY     multiple, x4   UPPER GASTROINTESTINAL ENDOSCOPY     VAGINAL HYSTERECTOMY  1990   because of the IC partial   Social History   Social History Narrative   Not on file   Immunization History  Administered Date(s) Administered   Influenza Inj Mdck Quad Pf 12/16/2017   Influenza Whole 12/31/2006   Influenza,inj,Quad PF,6+ Mos 12/24/2013, 02/20/2016, 10/30/2016, 11/30/2016, 11/26/2018   PFIZER(Purple Top)SARS-COV-2 Vaccination 06/18/2019, 07/13/2019, 03/06/2020   Pneumococcal Polysaccharide-23 09/04/2012   Td 04/23/2007   Tdap 06/17/2017     Objective: Vital Signs: BP (!) 147/87 (BP Location: Left Arm, Patient Position: Sitting, Cuff Size: Normal)   Pulse 63   Ht 5\' 4"  (1.626 m)   Wt 189 lb 6.4 oz (85.9 kg)   BMI 32.51 kg/m    Physical Exam Vitals and nursing note reviewed.  Constitutional:      Appearance: She is well-developed.  HENT:     Head: Normocephalic and atraumatic.  Eyes:     Conjunctiva/sclera: Conjunctivae normal.  Cardiovascular:     Rate and Rhythm: Normal rate and regular rhythm.     Heart sounds: Normal heart  sounds.  Pulmonary:     Effort: Pulmonary effort is normal.     Breath sounds: Normal breath sounds.  Abdominal:     General: Bowel sounds are normal.     Palpations: Abdomen is soft.  Musculoskeletal:     Cervical back: Normal range of motion.  Lymphadenopathy:     Cervical: No cervical adenopathy.  Skin:    General: Skin is warm and dry.     Capillary Refill: Capillary refill takes less than 2 seconds.     Comments: Erythematous macular dry patches were noted on her face.  Neurological:     Mental Status: She is alert and oriented to person, place, and time.  Psychiatric:        Behavior: Behavior normal.     Musculoskeletal Exam: C-spine was in good range of motion.  Shoulder joints, elbow joints, wrist joints, MCPs PIPs and DIPs with good range  of motion with no synovitis.  Hip joints, knee joints, ankles, MTPs and PIPs with good range of motion with no synovitis.  She has crepitus in the bilateral knee joints with no warmth swelling or effusion.  She generalized hyperalgesia and positive tender points.  CDAI Exam: CDAI Score: -- Patient Global: --; Provider Global: -- Swollen: --; Tender: -- Joint Exam 01/02/2021   No joint exam has been documented for this visit   There is currently no information documented on the homunculus. Go to the Rheumatology activity and complete the homunculus joint exam.  Investigation: No additional findings.  Imaging: No results found.  Recent Labs: Lab Results  Component Value Date   WBC 6.1 11/24/2019   HGB 14.2 11/24/2019   PLT 206.0 11/24/2019   NA 134 (L) 11/24/2019   K 4.9 11/24/2019   CL 97 11/24/2019   CO2 32 11/24/2019   GLUCOSE 128 (H) 11/24/2019   BUN 10 11/24/2019   CREATININE 0.89 11/24/2019   BILITOT 0.5 11/24/2019   ALKPHOS 80 11/24/2019   AST 14 12/19/2020   ALT 17 12/19/2020   PROT 6.8 11/24/2019   ALBUMIN 3.9 11/24/2019   CALCIUM 9.8 11/24/2019   GFRAA 98 04/23/2007    Speciality Comments: No specialty  comments available.  Procedures:  No procedures performed Allergies: Buspirone hcl, Crestor [rosuvastatin], Simvastatin, and Sulfa antibiotics   Assessment / Plan:     Visit Diagnoses: Fibromyalgia-she has been having a flare of fibromyalgia with generalized pain and discomfort.  She gives history of hyperalgesia.  She has multiple tender points today.  Stretching exercises were discussed.  I also discussed possible water therapy and water aerobics.  I will refer her to water therapy.  She is on Cymbalta and uses Voltaren gel.  She also has a prescription for baclofen which she takes occasionally for bladder spasms.  She experiences side effects from the muscle relaxers.  Other fatigue-related to fibromyalgia and insomnia.  Primary insomnia - She underwent nocturnal polysomnography on 02/14/2020 followed by a home sleep test on 04/19/2020.  Primary osteoarthritis of both hands-she continues to some stiffness in her hands.  No synovial thickening or synovitis was noted.  Chondromalacia of both patellae - X-rays of both knees were updated on 12/31/2018.  X-rays were consistent with bilateral moderate chondromalacia.  Lower extremity muscle strengthening exercises were discussed.  Trigger thumb, left thumb-she has intermittent symptoms.  She is currently not symptomatic.  Osteopenia of multiple sites - DEXA 05/13/18: The BMD measured at Femur Neck Left is 0.793 g/cm2 with a T-score of -1.8.  She has a DEXA scheduled in 2023.  History of vitamin D deficiency-vitamin D supplement was discussed.  History of hypertension-she is on atenolol.  Her blood pressures history elevated.  Advised her to monitor blood pressure closely.  History of hyperlipidemia-dietary modifications were discussed.  History of hyperglycemia  History of IBS  S/P laparoscopic cholecystectomy June 2017  Interstitial cystitis - She is followed by Dr. Amalia Hailey.   History of depression - Cymbalta 60 mg 1 capsule by mouth  daily.  Memory loss  Orders: No orders of the defined types were placed in this encounter.  No orders of the defined types were placed in this encounter.    Follow-Up Instructions: Return in about 6 months (around 07/03/2021) for Osteoarthritis, FMS.   Bo Merino, MD  Note - This record has been created using Editor, commissioning.  Chart creation errors have been sought, but may not always  have been located. Such creation  errors do not reflect on  the standard of medical care.

## 2020-12-22 ENCOUNTER — Encounter: Payer: Self-pay | Admitting: *Deleted

## 2020-12-23 ENCOUNTER — Other Ambulatory Visit: Payer: Self-pay | Admitting: Physician Assistant

## 2020-12-25 NOTE — Telephone Encounter (Signed)
Next Visit: 01/02/2021   Last Visit: 07/03/2020   Last Fill:09/22/2020   Dx:  Fibromyalgia   Current Dose per office note on 07/03/2020: Cymbalta 60 mg 1 capsule by mouth daily   Okay to refill Cymbalta?

## 2020-12-26 DIAGNOSIS — N3010 Interstitial cystitis (chronic) without hematuria: Secondary | ICD-10-CM | POA: Diagnosis not present

## 2021-01-02 ENCOUNTER — Encounter: Payer: Self-pay | Admitting: Rheumatology

## 2021-01-02 ENCOUNTER — Other Ambulatory Visit: Payer: Self-pay

## 2021-01-02 ENCOUNTER — Ambulatory Visit: Payer: Medicare Other | Admitting: Rheumatology

## 2021-01-02 VITALS — BP 147/87 | HR 63 | Ht 64.0 in | Wt 189.4 lb

## 2021-01-02 DIAGNOSIS — Z8659 Personal history of other mental and behavioral disorders: Secondary | ICD-10-CM

## 2021-01-02 DIAGNOSIS — M8589 Other specified disorders of bone density and structure, multiple sites: Secondary | ICD-10-CM | POA: Diagnosis not present

## 2021-01-02 DIAGNOSIS — R413 Other amnesia: Secondary | ICD-10-CM

## 2021-01-02 DIAGNOSIS — Z9049 Acquired absence of other specified parts of digestive tract: Secondary | ICD-10-CM

## 2021-01-02 DIAGNOSIS — Z8639 Personal history of other endocrine, nutritional and metabolic disease: Secondary | ICD-10-CM | POA: Diagnosis not present

## 2021-01-02 DIAGNOSIS — M2241 Chondromalacia patellae, right knee: Secondary | ICD-10-CM | POA: Diagnosis not present

## 2021-01-02 DIAGNOSIS — M2242 Chondromalacia patellae, left knee: Secondary | ICD-10-CM

## 2021-01-02 DIAGNOSIS — M65312 Trigger thumb, left thumb: Secondary | ICD-10-CM | POA: Diagnosis not present

## 2021-01-02 DIAGNOSIS — Z8719 Personal history of other diseases of the digestive system: Secondary | ICD-10-CM | POA: Diagnosis not present

## 2021-01-02 DIAGNOSIS — R5383 Other fatigue: Secondary | ICD-10-CM | POA: Diagnosis not present

## 2021-01-02 DIAGNOSIS — Z8679 Personal history of other diseases of the circulatory system: Secondary | ICD-10-CM | POA: Diagnosis not present

## 2021-01-02 DIAGNOSIS — M19041 Primary osteoarthritis, right hand: Secondary | ICD-10-CM

## 2021-01-02 DIAGNOSIS — F5101 Primary insomnia: Secondary | ICD-10-CM | POA: Diagnosis not present

## 2021-01-02 DIAGNOSIS — M170 Bilateral primary osteoarthritis of knee: Secondary | ICD-10-CM

## 2021-01-02 DIAGNOSIS — N3010 Interstitial cystitis (chronic) without hematuria: Secondary | ICD-10-CM

## 2021-01-02 DIAGNOSIS — M19042 Primary osteoarthritis, left hand: Secondary | ICD-10-CM

## 2021-01-02 DIAGNOSIS — M797 Fibromyalgia: Secondary | ICD-10-CM

## 2021-01-08 IMAGING — MG DIGITAL SCREENING BILAT W/ TOMO W/ CAD
6 of 10 series · 6 of 30 positions shown · non-contrast
Comparison: Previous exam(s).

CLINICAL DATA: Screening.

EXAM:
DIGITAL SCREENING BILATERAL MAMMOGRAM WITH TOMO AND CAD

[L MLO synth-2D]
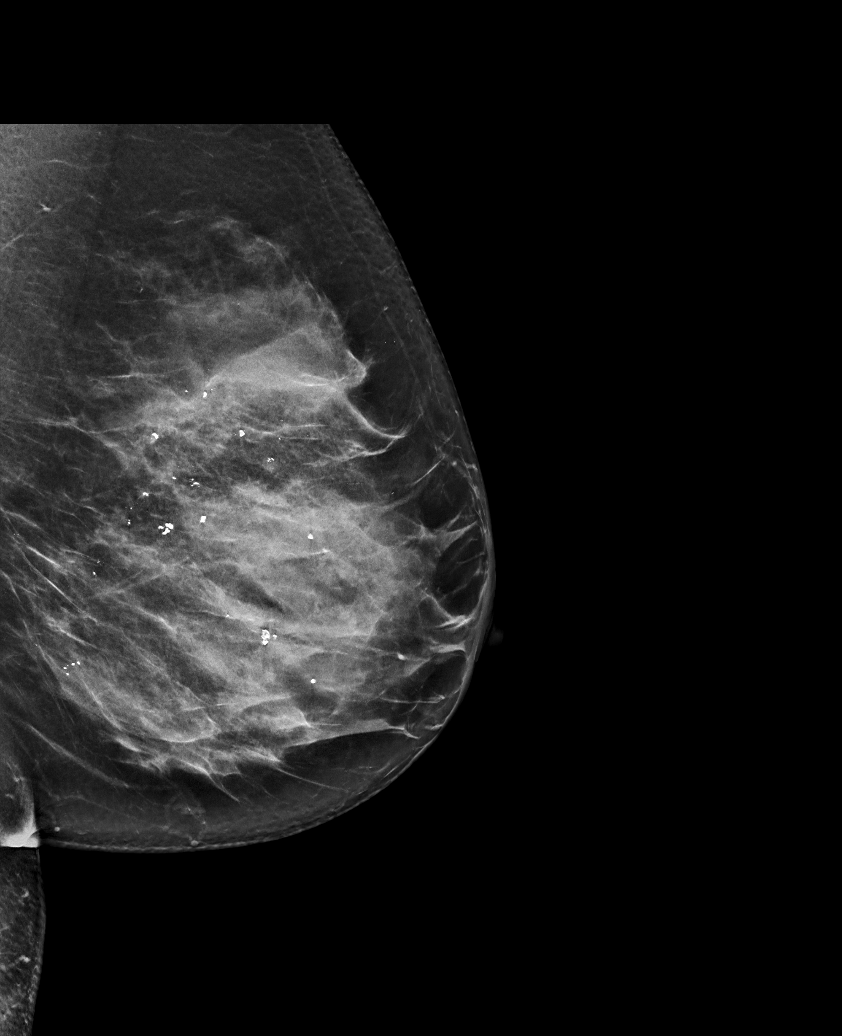

[R MLO synth-2D (1 of 2)]
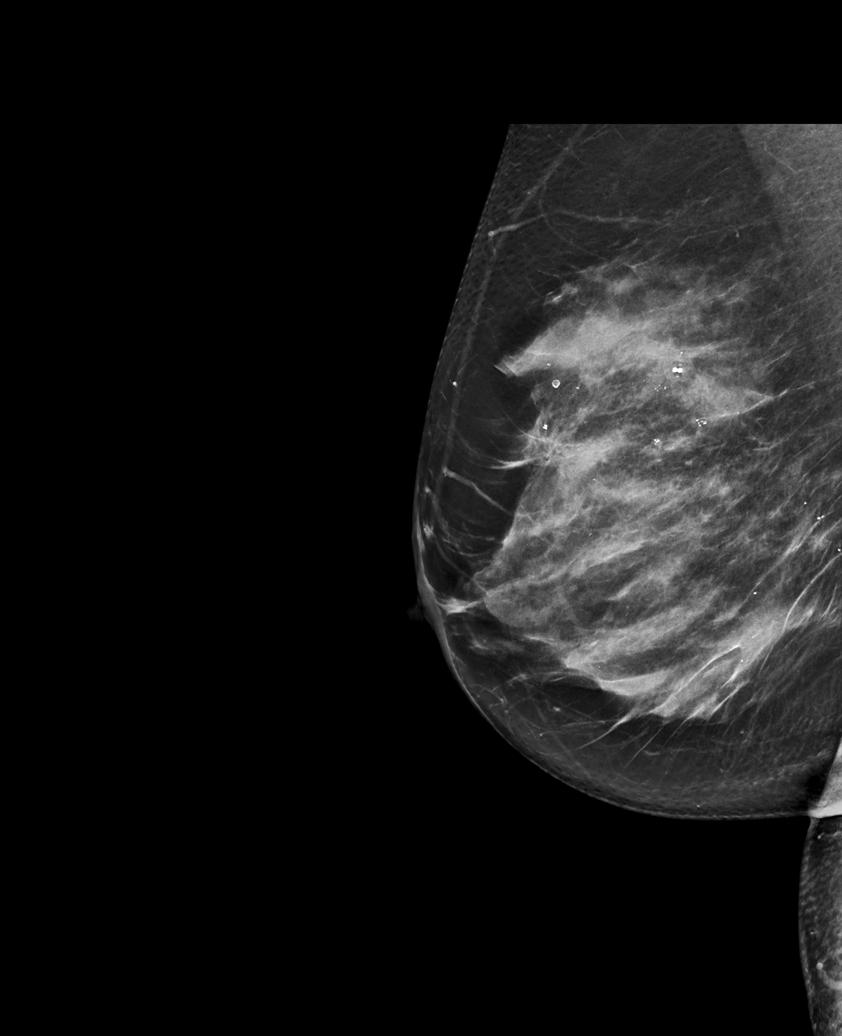

[R MLO synth-2D (2 of 2)]
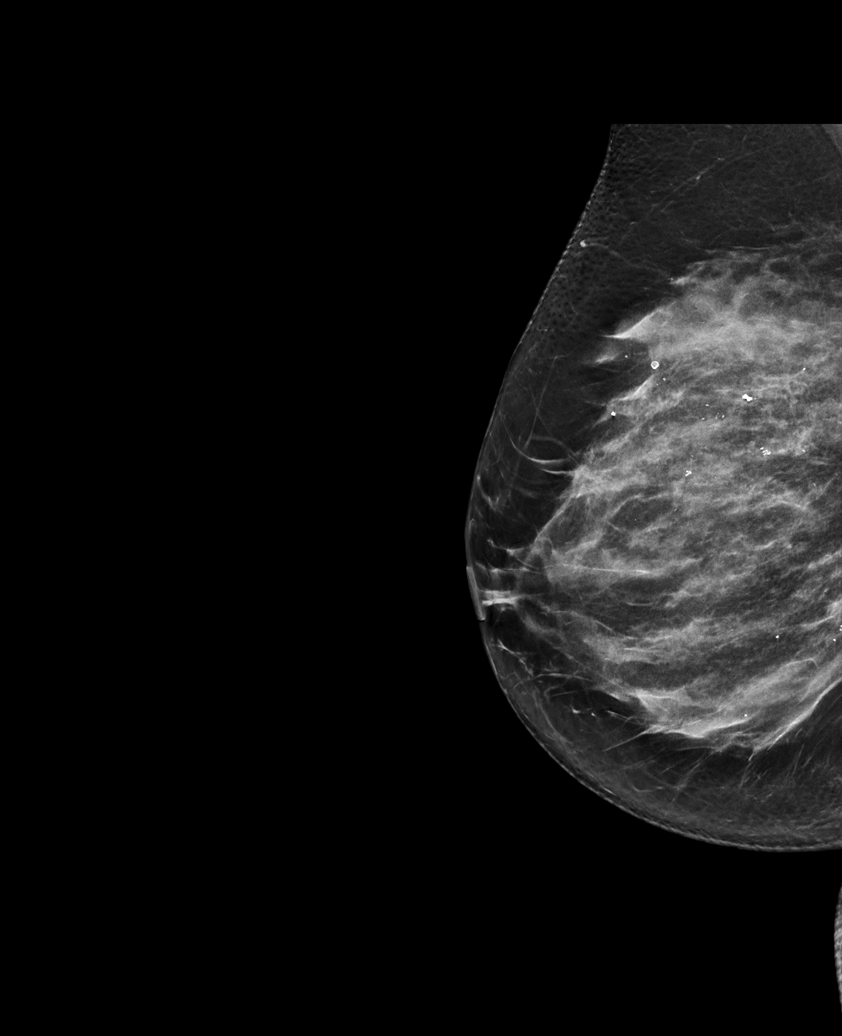

[L CC synth-2D]
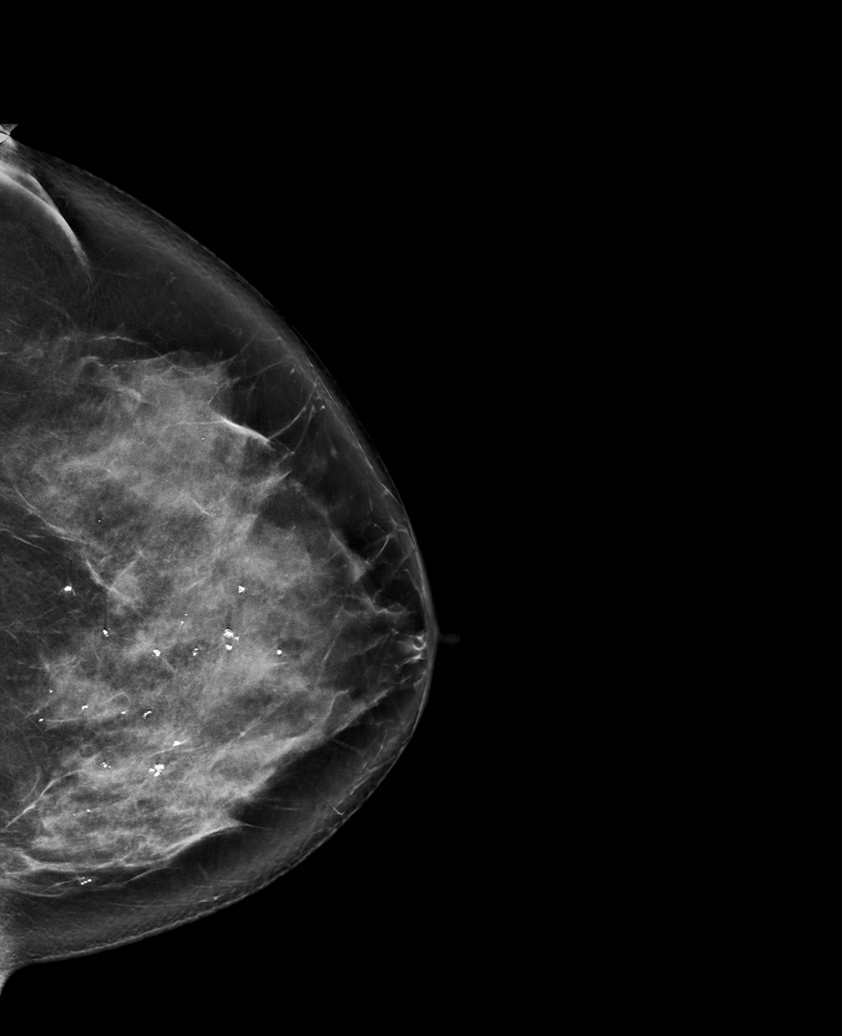

[R CC synth-2D]
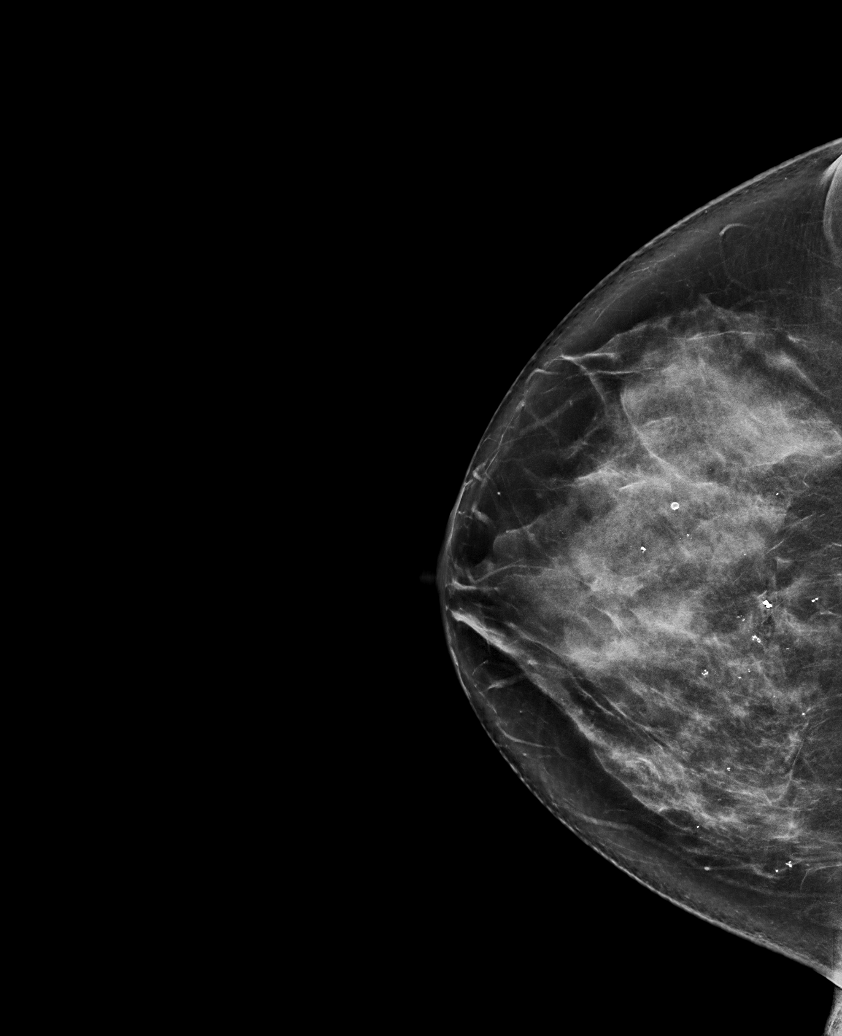

[L MLO tomo · tomo slice 41/80.0]
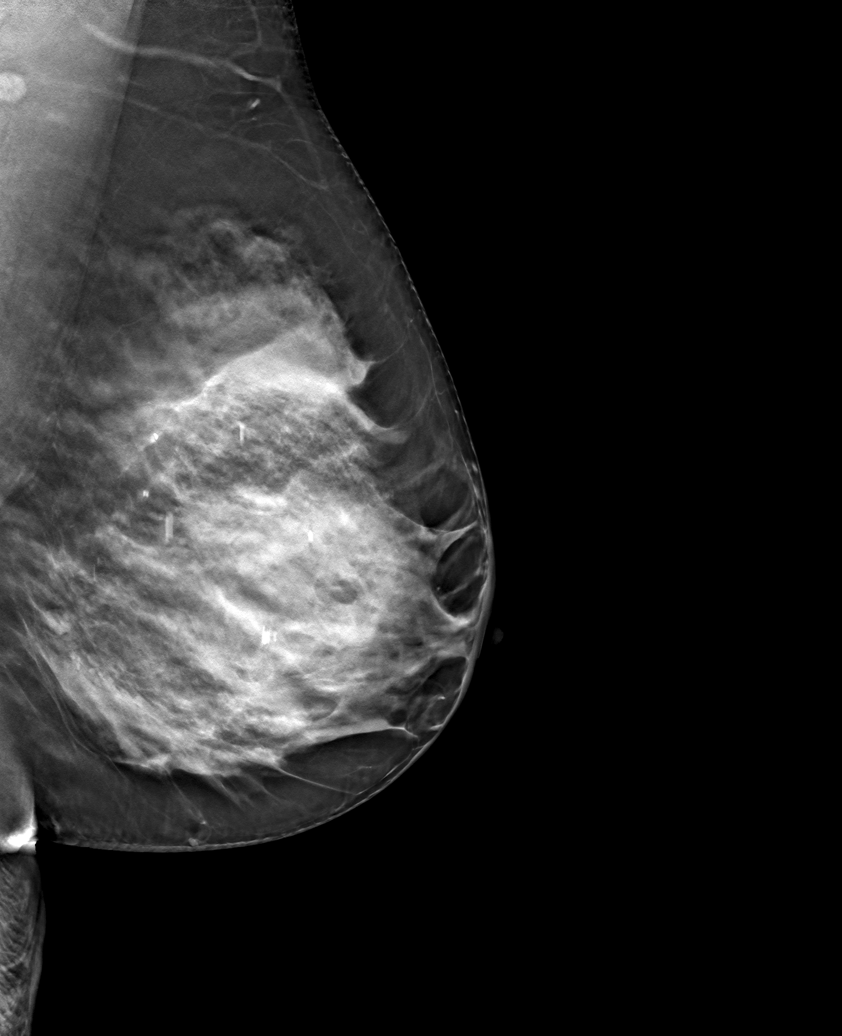

[6 of 30 positions shown; findings below may reference images not displayed]

ACR Breast Density Category c: The breast tissue is heterogeneously
dense, which may obscure small masses.
FINDINGS: There are no findings suspicious for malignancy. Images were
processed with CAD.
IMPRESSION: No mammographic evidence of malignancy. A result letter of this
screening mammogram will be mailed directly to the patient.

RECOMMENDATION:
Screening mammogram in one year. (Code:FT-[id number])

BI-RADS CATEGORY  1: Negative.

## 2021-01-11 ENCOUNTER — Telehealth: Payer: Self-pay

## 2021-01-11 DIAGNOSIS — M791 Myalgia, unspecified site: Secondary | ICD-10-CM | POA: Diagnosis not present

## 2021-01-11 DIAGNOSIS — G518 Other disorders of facial nerve: Secondary | ICD-10-CM | POA: Diagnosis not present

## 2021-01-11 DIAGNOSIS — G43719 Chronic migraine without aura, intractable, without status migrainosus: Secondary | ICD-10-CM | POA: Diagnosis not present

## 2021-01-11 DIAGNOSIS — M542 Cervicalgia: Secondary | ICD-10-CM | POA: Diagnosis not present

## 2021-01-11 NOTE — Progress Notes (Addendum)
Chronic Care Management Pharmacy Assistant   Name: Lindsey [name]  MRN: [id number] DOB: 10-Nov-1955  Reason for Encounter: CCM Hypertension Disease State   01/16/2021 - Spoke with patient in regards to her blood pressure being high at her last Rheumatology appointment on 01/02/2021. Patient stated there was no reason it was high that day. Patient stated she has since been to the [facility] [place] and it was a little lower. Patient could not recall exact numbers, but said it was in the 130s over 80s. Patient was advised to purchase a blood pressure monitor for home use and patient agreed. She stated she had $50 in medical spending so she would use the money for that. She told me it takes a few days for equipment to be shipped. I asked patient to take her BP every morning before medications for a few weeks and log it. Told patient I would follow up with her in a few weeks for her readings. I asked patient about her CPAP machine. She stated it has been on backorder and she got a call last week informing her that she has moved up the list. They are going to call her as soon as they have one for her. Patient stated it will take a couple of weeks to get it.    Recent office visits:  None since last CCM contact  Recent consult visits:  01/02/2021 - Rheumatology - Patient presented for Fibromyalgia. Referral to Physical Therapy.  Hospital visits:  None in previous 6 months  Medications: Outpatient Encounter Medications as of 01/11/2021  Medication Sig   atenolol (TENORMIN) 100 MG tablet TAKE 1 TABLET BY MOUTH EVERY DAY   baclofen (LIORESAL) 20 MG tablet Take 20 mg by mouth daily as needed for muscle spasms.   botulinum toxin Type A (BOTOX) 100 units SOLR injection Inject 200 Units into the muscle every 3 (three) months.   calcium carbonate (TUMS - DOSED IN MG ELEMENTAL CALCIUM) 500 MG chewable tablet Chew 1 tablet by mouth daily as needed for indigestion or heartburn.   cholecalciferol  (VITAMIN D) 1000 UNITS tablet Take 5,000 Units by mouth daily.    diclofenac sodium (VOLTAREN) 1 % GEL Apply 2 to 4 gram to affected joints up to 4 times daily   doxycycline (VIBRAMYCIN) 100 MG capsule Take 100 mg by mouth 2 (two) times daily.   DULoxetine (CYMBALTA) 60 MG capsule TAKE 1 CAPSULE BY MOUTH EVERY DAY   ezetimibe (ZETIA) 10 MG tablet Take 1 tablet (10 mg total) by mouth daily.   famotidine (PEPCID) 20 MG tablet Take 20 mg by mouth 2 (two) times daily as needed for heartburn or indigestion.   fentaNYL (DURAGESIC - DOSED MCG/HR) 50 MCG/HR Place 50 mcg onto the skin every other day.   hydrOXYzine (ATARAX/VISTARIL) 50 MG tablet Take 100 mg by mouth at bedtime.   lidocaine (XYLOCAINE) 5 % ointment APPLY TO AFFECTED AREA EVERY DAY AS DIRECTED   LUTEIN PO Take 1 tablet by mouth daily.   nitrofurantoin (MACRODANTIN) 50 MG capsule Take 50 mg by mouth at bedtime.   polyethylene glycol powder (GLYCOLAX/MIRALAX) 17 GM/SCOOP powder Take by mouth.   rosuvastatin (CRESTOR) 5 MG tablet Take one pill twice weekly by mouth (Patient not taking: Reported on 01/02/2021)   No facility-administered encounter medications on file as of 01/11/2021.    Recent Office Vitals: BP Readings from Last 3 Encounters:  01/02/21 (!) 147/87  11/06/20 124/62  07/03/20 128/83   Pulse Readings from  Last 3 Encounters:  01/02/21 63  11/06/20 63  07/03/20 69    Wt Readings from Last 3 Encounters:  01/02/21 189 lb 6.4 oz (85.9 kg)  11/06/20 184 lb 6 oz (83.6 kg)  07/03/20 185 lb (83.9 kg)     Kidney Function Lab Results  Component Value Date/Time   CREATININE 0.89 11/24/2019 12:05 PM   CREATININE 0.70 08/10/2018 12:34 PM   GFR 64.04 11/24/2019 12:05 PM   GFRNONAA 79.88 05/17/2009 04:38 PM   GFRAA 98 04/23/2007 11:26 AM    BMP Latest Ref Rng & Units 11/24/2019 08/10/2018 06/17/2017  Glucose 70 - 99 mg/dL 128(H) 110(H) 113(H)  BUN 6 - 23 mg/dL 10 10 7   Creatinine 0.40 - 1.20 mg/dL 0.89 0.70 0.70  Sodium  135 - 145 mEq/L 134(L) 137 136  Potassium 3.5 - 5.1 mEq/L 4.9 4.8 4.9  Chloride 96 - 112 mEq/L 97 100 100  CO2 19 - 32 mEq/L 32 30 31  Calcium 8.4 - 10.5 mg/dL 9.8 9.7 10.2   Contacted patient on 01/11/2021 to discuss hypertension disease state  Current antihypertensive regimen:  Atenolol 100mg  - Take 1 tablet by mouth every day.   Patient verbally confirms she is taking the above medications as directed. Yes  How often are you checking your Blood Pressure? Patient does not check at home.   What recent interventions/DTPs have been made by any provider to improve Blood Pressure control since last CPP Visit: No recent interventions.   Any recent hospitalizations or ED visits since last visit with CPP? No  What diet changes have been made to improve Blood Pressure Control?  Patient does not have any diet changes in regards to HTN; cut back on sugar a little bit.   What exercise is being done to improve your Blood Pressure Control?  Patient has tried to start walking more  Adherence Review: Is the patient currently on ACE/ARB medication? No Does the patient have >5 day gap between last estimated fill dates? No  Star Rating Drugs:  Medication:  Last Fill: Day Supply Zetia 10mg   11/23/2020 90 Patient was switched from Rosuvastatin to Zetia per patient  Care Gaps: Annual wellness visit in last year? Yes Most Recent BP reading: 147/87 on 01/02/2021  No appointments scheduled within the next 30 days.  [name], CPP notified  [name], [place] Clinical Pharmacy Assistant [phone]  I have reviewed the care management and care coordination activities outlined in this encounter and I am certifying that I agree with the content of this note. See addendum.  [name], [name] Clinical Pharmacist [facility] [phone]

## 2021-01-31 ENCOUNTER — Telehealth: Payer: Self-pay

## 2021-01-31 NOTE — Progress Notes (Addendum)
Chronic Care Management Pharmacy Assistant   Name: Lindsey [name]  MRN: [id number] DOB: Sep 08, 1955  Reason for Encounter: CCM (Hypertension Disease State)   Recent office visits:  None since last CCM contact  Recent consult visits:  None since last CCM contact  Hospital visits:  None in previous 6 months  Medications: Outpatient Encounter Medications as of 01/31/2021  Medication Sig   atenolol (TENORMIN) 100 MG tablet TAKE 1 TABLET BY MOUTH EVERY DAY   baclofen (LIORESAL) 20 MG tablet Take 20 mg by mouth daily as needed for muscle spasms.   botulinum toxin Type A (BOTOX) 100 units SOLR injection Inject 200 Units into the muscle every 3 (three) months.   calcium carbonate (TUMS - DOSED IN MG ELEMENTAL CALCIUM) 500 MG chewable tablet Chew 1 tablet by mouth daily as needed for indigestion or heartburn.   cholecalciferol (VITAMIN D) 1000 UNITS tablet Take 5,000 Units by mouth daily.    diclofenac sodium (VOLTAREN) 1 % GEL Apply 2 to 4 gram to affected joints up to 4 times daily   doxycycline (VIBRAMYCIN) 100 MG capsule Take 100 mg by mouth 2 (two) times daily.   DULoxetine (CYMBALTA) 60 MG capsule TAKE 1 CAPSULE BY MOUTH EVERY DAY   ezetimibe (ZETIA) 10 MG tablet Take 1 tablet (10 mg total) by mouth daily.   famotidine (PEPCID) 20 MG tablet Take 20 mg by mouth 2 (two) times daily as needed for heartburn or indigestion.   fentaNYL (DURAGESIC - DOSED MCG/HR) 50 MCG/HR Place 50 mcg onto the skin every other day.   hydrOXYzine (ATARAX/VISTARIL) 50 MG tablet Take 100 mg by mouth at bedtime.   lidocaine (XYLOCAINE) 5 % ointment APPLY TO AFFECTED AREA EVERY DAY AS DIRECTED   LUTEIN PO Take 1 tablet by mouth daily.   nitrofurantoin (MACRODANTIN) 50 MG capsule Take 50 mg by mouth at bedtime.   polyethylene glycol powder (GLYCOLAX/MIRALAX) 17 GM/SCOOP powder Take by mouth.   rosuvastatin (CRESTOR) 5 MG tablet Take one pill twice weekly by mouth (Patient not taking: Reported on 01/02/2021)   No  facility-administered encounter medications on file as of 01/31/2021.   Recent Office Vitals: BP Readings from Last 3 Encounters:  01/02/21 (!) 147/87  11/06/20 124/62  07/03/20 128/83   Pulse Readings from Last 3 Encounters:  01/02/21 63  11/06/20 63  07/03/20 69    Wt Readings from Last 3 Encounters:  01/02/21 189 lb 6.4 oz (85.9 kg)  11/06/20 184 lb 6 oz (83.6 kg)  07/03/20 185 lb (83.9 kg)    Kidney Function Lab Results  Component Value Date/Time   CREATININE 0.89 11/24/2019 12:05 PM   CREATININE 0.70 08/10/2018 12:34 PM   GFR 64.04 11/24/2019 12:05 PM   GFRNONAA 79.88 05/17/2009 04:38 PM   GFRAA 98 04/23/2007 11:26 AM   BMP Latest Ref Rng & Units 11/24/2019 08/10/2018 06/17/2017  Glucose 70 - 99 mg/dL 128(H) 110(H) 113(H)  BUN 6 - 23 mg/dL 10 10 7   Creatinine 0.40 - 1.20 mg/dL 0.89 0.70 0.70  Sodium 135 - 145 mEq/L 134(L) 137 136  Potassium 3.5 - 5.1 mEq/L 4.9 4.8 4.9  Chloride 96 - 112 mEq/L 97 100 100  CO2 19 - 32 mEq/L 32 30 31  Calcium 8.4 - 10.5 mg/dL 9.8 9.7 10.2    Contacted patient on 02/19/2021 to discuss hypertension disease state  Current antihypertensive regimen:  Atenolol 100mg  - Take 1 tablet by mouth every day.   Patient verbally confirms she is taking the  above medications as directed. Yes  How often are you checking your Blood Pressure? infrequently  she checks her blood pressure in the morning before taking her medication. Patient just purchased a new blood pressure cuff. She took her blood pressure this morning and it was 149/83.   Wrist or arm cuff: Arm Caffeine intake: Drinks 1 cup of coffee and Green Tea  Salt intake: Does not add salt  OTC medications including pseudoephedrine or NSAIDs? Ibuprofen every once in a while; Pepcid (chewable) 750 mg - 2 chewables every day   Any readings above 180/120? No  What recent interventions/DTPs have been made by any provider to improve Blood Pressure control since last CPP Visit: No recent  interventions  Any recent hospitalizations or ED visits since last visit with CPP? No  What diet changes have been made to improve Blood Pressure Control?  Patient states there are no changes.   What exercise is being done to improve your Blood Pressure Control?  Patient states she does not exercise.   Adherence Review: Is the patient currently on ACE/ARB medication? No Does the patient have >5 day gap between last estimated fill dates? No  Star Rating Drugs:  Medication:  Last Fill: Day Supply Zetia 10mg                   11/23/2020      90  Care Gaps: Annual wellness visit in last year? Yes Most Recent BP reading: 147/87 on 01/02/2021  No appointments scheduled within the next 30 days.  [name], CPP notified  [name], [place] Clinical Pharmacy Assistant [phone]  I have reviewed the care management and care coordination activities outlined in this encounter and I am certifying that I agree with the content of this note. BP elevated, will have team follow up again next month for log.  [name], [name] Clinical Pharmacist [facility] [phone]

## 2021-02-13 DIAGNOSIS — N3010 Interstitial cystitis (chronic) without hematuria: Secondary | ICD-10-CM | POA: Diagnosis not present

## 2021-02-27 DIAGNOSIS — G4733 Obstructive sleep apnea (adult) (pediatric): Secondary | ICD-10-CM | POA: Diagnosis not present

## 2021-02-28 ENCOUNTER — Ambulatory Visit (INDEPENDENT_AMBULATORY_CARE_PROVIDER_SITE_OTHER): Payer: Medicare Other

## 2021-02-28 VITALS — Ht 64.0 in | Wt 189.0 lb

## 2021-02-28 DIAGNOSIS — Z0000 Encounter for general adult medical examination without abnormal findings: Secondary | ICD-10-CM | POA: Diagnosis not present

## 2021-02-28 NOTE — Patient Instructions (Signed)
Lindsey [name] , Thank you for taking time to complete your Medicare Wellness Visit. I appreciate your ongoing commitment to your health goals. Please review the following plan we discussed and let me know if I can assist you in the future.   Screening recommendations/referrals: Colonoscopy: due, last completed 08/09/10, you plan to call to schedule an appointment Mammogram: up to date, completed 11/20/20, due 11/20/21 Bone Density: last completed 05/13/18, scheduled for 05/02/21 Recommended yearly ophthalmology/optometry visit for glaucoma screening and checkup Recommended yearly dental visit for hygiene and checkup  Vaccinations: Influenza vaccine: Due-May obtain vaccine at our office or your local pharmacy. Pneumococcal vaccine: up to date Tdap vaccine: up to date, completed 06/17/17, due 06/18/27 Shingles vaccine: Discuss with your local pharmacy  Covid-19:  Newest booster available at your local pharmacy   Advanced directives: information available at your next appointment  Conditions/risks identified: see problem list  Next appointment: Follow up in one year for your annual wellness visit. 03/04/22 @ 12:00pm , this will be a telephone visit  Preventive Care 40-64 Years, Female Preventive care refers to lifestyle choices and visits with your health care provider that can promote health and wellness. What does preventive care include? A yearly physical exam. This is also called an annual well check. Dental exams once or twice a year. Routine eye exams. Ask your health care provider how often you should have your eyes checked. Personal lifestyle choices, including: Daily care of your teeth and gums. Regular physical activity. Eating a healthy diet. Avoiding tobacco and drug use. Limiting alcohol use. Practicing safe sex. Taking low-dose aspirin daily starting at age 32. Taking vitamin and mineral supplements as recommended by your health care provider. What happens during an annual well  check? The services and screenings done by your health care provider during your annual well check will depend on your age, overall health, lifestyle risk factors, and family history of disease. Counseling  Your health care provider may ask you questions about your: Alcohol use. Tobacco use. Drug use. Emotional well-being. Home and relationship well-being. Sexual activity. Eating habits. Work and work Statistician. Method of birth control. Menstrual cycle. Pregnancy history. Screening  You may have the following tests or measurements: Height, weight, and BMI. Blood pressure. Lipid and cholesterol levels. These may be checked every 5 years, or more frequently if you are over 39 years old. Skin check. Lung cancer screening. You may have this screening every year starting at age 73 if you have a 30-pack-year history of smoking and currently smoke or have quit within the past 15 years. Fecal occult blood test (FOBT) of the stool. You may have this test every year starting at age 34. Flexible sigmoidoscopy or colonoscopy. You may have a sigmoidoscopy every 5 years or a colonoscopy every 10 years starting at age 39. Hepatitis C blood test. Hepatitis B blood test. Sexually transmitted disease (STD) testing. Diabetes screening. This is done by checking your blood sugar (glucose) after you have not eaten for a while (fasting). You may have this done every 1-3 years. Mammogram. This may be done every 1-2 years. Talk to your health care provider about when you should start having regular mammograms. This may depend on whether you have a family history of breast cancer. BRCA-related cancer screening. This may be done if you have a family history of breast, ovarian, tubal, or peritoneal cancers. Pelvic exam and Pap test. This may be done every 3 years starting at age 6. Starting at age 17, this may be  done every 5 years if you have a Pap test in combination with an HPV test. Bone density scan. This  is done to screen for osteoporosis. You may have this scan if you are at high risk for osteoporosis. Discuss your test results, treatment options, and if necessary, the need for more tests with your health care provider. Vaccines  Your health care provider may recommend certain vaccines, such as: Influenza vaccine. This is recommended every year. Tetanus, diphtheria, and acellular pertussis (Tdap, Td) vaccine. You may need a Td booster every 10 years. Zoster vaccine. You may need this after age 81. Pneumococcal 13-valent conjugate (PCV13) vaccine. You may need this if you have certain conditions and were not previously vaccinated. Pneumococcal polysaccharide (PPSV23) vaccine. You may need one or two doses if you smoke cigarettes or if you have certain conditions. Talk to your health care provider about which screenings and vaccines you need and how often you need them. This information is not intended to replace advice given to you by your health care provider. Make sure you discuss any questions you have with your health care provider. Document Released: 04/14/2015 Document Revised: 12/06/2015 Document Reviewed: 01/17/2015 Elsevier Interactive Patient Education  2017 [employer] Prevention in the Home Falls can cause injuries. They can happen to people of all ages. There are many things you can do to make your home safe and to help prevent falls. What can I do on the outside of my home? Regularly fix the edges of walkways and driveways and fix any cracks. Remove anything that might make you trip as you walk through a door, such as a raised step or threshold. Trim any bushes or trees on the path to your home. Use bright outdoor lighting. Clear any walking paths of anything that might make someone trip, such as rocks or tools. Regularly check to see if handrails are loose or broken. Make sure that both sides of any steps have handrails. Any raised decks and porches should have  guardrails on the edges. Have any leaves, snow, or ice cleared regularly. Use sand or salt on walking paths during winter. Clean up any spills in your garage right away. This includes oil or grease spills. What can I do in the bathroom? Use night lights. Install grab bars by the toilet and in the tub and shower. Do not use towel bars as grab bars. Use non-skid mats or decals in the tub or shower. If you need to sit down in the shower, use a plastic, non-slip stool. Keep the floor dry. Clean up any water that spills on the floor as soon as it happens. Remove soap buildup in the tub or shower regularly. Attach bath mats securely with double-sided non-slip rug tape. Do not have throw rugs and other things on the floor that can make you trip. What can I do in the bedroom? Use night lights. Make sure that you have a light by your bed that is easy to reach. Do not use any sheets or blankets that are too big for your bed. They should not hang down onto the floor. Have a firm chair that has side arms. You can use this for support while you get dressed. Do not have throw rugs and other things on the floor that can make you trip. What can I do in the kitchen? Clean up any spills right away. Avoid walking on wet floors. Keep items that you use a lot in easy-to-reach places. If you  need to reach something above you, use a strong step stool that has a grab bar. Keep electrical cords out of the way. Do not use floor polish or wax that makes floors slippery. If you must use wax, use non-skid floor wax. Do not have throw rugs and other things on the floor that can make you trip. What can I do with my stairs? Do not leave any items on the stairs. Make sure that there are handrails on both sides of the stairs and use them. Fix handrails that are broken or loose. Make sure that handrails are as long as the stairways. Check any carpeting to make sure that it is firmly attached to the stairs. Fix any carpet  that is loose or worn. Avoid having throw rugs at the top or bottom of the stairs. If you do have throw rugs, attach them to the floor with carpet tape. Make sure that you have a light switch at the top of the stairs and the bottom of the stairs. If you do not have them, ask someone to add them for you. What else can I do to help prevent falls? Wear shoes that: Do not have high heels. Have rubber bottoms. Are comfortable and fit you well. Are closed at the toe. Do not wear sandals. If you use a stepladder: Make sure that it is fully opened. Do not climb a closed stepladder. Make sure that both sides of the stepladder are locked into place. Ask someone to hold it for you, if possible. Clearly mark and make sure that you can see: Any grab bars or handrails. First and last steps. Where the edge of each step is. Use tools that help you move around (mobility aids) if they are needed. These include: Canes. Walkers. Scooters. Crutches. Turn on the lights when you go into a dark area. Replace any light bulbs as soon as they burn out. Set up your furniture so you have a clear path. Avoid moving your furniture around. If any of your floors are uneven, fix them. If there are any pets around you, be aware of where they are. Review your medicines with your doctor. Some medicines can make you feel dizzy. This can increase your chance of falling. Ask your doctor what other things that you can do to help prevent falls. This information is not intended to replace advice given to you by your health care provider. Make sure you discuss any questions you have with your health care provider. Document Released: 01/12/2009 Document Revised: 08/24/2015 Document Reviewed: 04/22/2014 Elsevier Interactive Patient Education  2017 [employer].

## 2021-02-28 NOTE — Progress Notes (Signed)
Subjective:   Lindsey [name] is a 65 y.o. female who presents for Medicare Annual (Subsequent) preventive examination.  I connected with Lindsey [name] today by telephone and verified that I am speaking with the correct person using two identifiers. Location patient: home Location provider: work Persons participating in the virtual visit: patient, Marine scientist.    I discussed the limitations, risks, security and privacy concerns of performing an evaluation and management service by telephone and the availability of in person appointments. I also discussed with the patient that there may be a patient responsible charge related to this service. The patient expressed understanding and verbally consented to this telephonic visit.    Interactive audio and video telecommunications were attempted between this provider and patient, however failed, due to patient having technical difficulties OR patient did not have access to video capability.  We continued and completed visit with audio only.  Some vital signs may be absent or patient reported.   Time Spent with patient on telephone encounter: 25 minutes  Review of Systems     Cardiac Risk Factors include: advanced age (>12men, >106 women);hypertension;dyslipidemia     Objective:    Today's Vitals   02/28/21 1156 02/28/21 1157  Weight: 189 lb (85.7 kg)   Height: 5\' 4"  (1.626 m)   PainSc:  6    Body mass index is 32.44 kg/m.  Advanced Directives 02/28/2021 08/25/2019 08/07/2018 08/05/2017 04/23/2016 09/20/2015  Does Patient Have a Medical Advance Directive? No No No No No No  Would patient like information on creating a medical advance directive? Yes (MAU/Ambulatory/Procedural Areas - Information given) No - Patient declined No - Patient declined No - Patient declined - -    Current Medications (verified) Outpatient Encounter Medications as of 02/28/2021  Medication Sig   atenolol (TENORMIN) 100 MG tablet TAKE 1 TABLET BY MOUTH EVERY DAY   baclofen  (LIORESAL) 20 MG tablet Take 20 mg by mouth daily as needed for muscle spasms.   botulinum toxin Type A (BOTOX) 100 units SOLR injection Inject 200 Units into the muscle every 3 (three) months.   calcium carbonate (TUMS - DOSED IN MG ELEMENTAL CALCIUM) 500 MG chewable tablet Chew 1 tablet by mouth daily as needed for indigestion or heartburn.   cholecalciferol (VITAMIN D) 1000 UNITS tablet Take 5,000 Units by mouth daily.    diclofenac sodium (VOLTAREN) 1 % GEL Apply 2 to 4 gram to affected joints up to 4 times daily   doxycycline (VIBRAMYCIN) 100 MG capsule Take 100 mg by mouth 2 (two) times daily.   DULoxetine (CYMBALTA) 60 MG capsule TAKE 1 CAPSULE BY MOUTH EVERY DAY   ezetimibe (ZETIA) 10 MG tablet Take 1 tablet (10 mg total) by mouth daily.   famotidine (PEPCID) 20 MG tablet Take 20 mg by mouth 2 (two) times daily as needed for heartburn or indigestion.   fentaNYL (DURAGESIC - DOSED MCG/HR) 50 MCG/HR Place 50 mcg onto the skin every other day.   hydrOXYzine (ATARAX/VISTARIL) 50 MG tablet Take 100 mg by mouth at bedtime.   lidocaine (XYLOCAINE) 5 % ointment APPLY TO AFFECTED AREA EVERY DAY AS DIRECTED   LUTEIN PO Take 1 tablet by mouth daily.   nitrofurantoin (MACRODANTIN) 50 MG capsule Take 50 mg by mouth at bedtime.   polyethylene glycol powder (GLYCOLAX/MIRALAX) 17 GM/SCOOP powder Take by mouth.   rosuvastatin (CRESTOR) 5 MG tablet Take one pill twice weekly by mouth (Patient not taking: Reported on 01/02/2021)   No facility-administered encounter medications on file  as of 02/28/2021.    Allergies (verified) Buspirone hcl, Crestor [rosuvastatin], Simvastatin, and Sulfa antibiotics   History: Past Medical History:  Diagnosis Date   Arthritis    Cataract    Chronic fatigue    Early onset macular degeneration    Fibromyalgia    Hemorrhoids    Hyperglycemia 12/24/2013   Hyperlipidemia    Hypersomnia    Hypertension    IBS (irritable bowel syndrome)    Interstitial cystitis     Migraines    Osteopenia    Pelvic floor dysfunction    PFO (patent foramen ovale)    Tendonitis    hand   Vitamin D deficiency    Past Surgical History:  Procedure Laterality Date   ANKLE ARTHROSCOPY Right    ANKLE FRACTURE SURGERY Left    BLADDER SURGERY     and hydrodistension   BREAST EXCISIONAL BIOPSY Bilateral    Multiple Benign Lumps removed     BREAST LUMPECTOMY     many, bilateral   CERVICAL DISCECTOMY  2006   fusion and plating   CHOLECYSTECTOMY N/A 09/26/2015   Procedure: LAPAROSCOPIC CHOLECYSTECTOMY WITH INTRAOPERATIVE CHOLANGIOGRAM;  Surgeon: Johnathan Hausen, MD;  Location: [facility] ORS;  Service: General;  Laterality: N/A;   COLONOSCOPY     FRACTURE SURGERY     ankle fracture x 2   NASAL SINUS SURGERY     multiple, x4   UPPER GASTROINTESTINAL ENDOSCOPY     VAGINAL HYSTERECTOMY  1990   because of the IC partial   Family History  Problem Relation Age of Onset   Diabetes Father    Alcohol abuse Father    Heart disease Father    Fibromyalgia Mother    Colon cancer Paternal Uncle    Stomach cancer Paternal Grandfather    Diabetes Paternal Grandfather    Diabetes Paternal Grandmother    Stroke Paternal Grandmother    Breast cancer Neg Hx    Social History   Socioeconomic History   Marital status: Married    Spouse name: Not on file   Number of children: 0   Years of education: Not on file   Highest education level: Not on file  Occupational History   Occupation: Disabled from [place]: DISABLED  Tobacco Use   Smoking status: Never   Smokeless tobacco: Never  Vaping Use   Vaping Use: Never used  Substance and Sexual Activity   Alcohol use: No    Alcohol/week: 0.0 standard drinks   Drug use: No   Sexual activity: Yes  Other Topics Concern   Not on file  Social History Narrative   Not on file   Social Determinants of Health   Financial Resource Strain: Low Risk    Difficulty of Paying Living Expenses: Not hard at all  Food Insecurity: No  Food Insecurity   Worried About Charity fundraiser in the Last Year: Never true   Arboriculturist in the Last Year: Never true  Transportation Needs: No Transportation Needs   Lack of Transportation (Medical): No   Lack of Transportation (Non-Medical): No  Physical Activity: Inactive   Days of Exercise per Week: 0 days   Minutes of Exercise per Session: 0 min  Stress: No Stress Concern Present   Feeling of Stress : Only a little  Social Connections: Socially Isolated   Frequency of Communication with Friends and Family: Never   Frequency of Social Gatherings with Friends and Family: Never   Attends Religious  Services: Never   Marine scientist or Organizations: No   Attends Music therapist: Never   Marital Status: Married    Tobacco Counseling Counseling given: Not Answered   Clinical Intake:  Pre-visit preparation completed: Yes  Pain : 0-10 Pain Score: 6  Pain Type: Chronic pain Pain Location: Other (Comment) (Patient states having fibromyalgia)     BMI - recorded: 32.49 Nutritional Status: BMI > 30  Obese Nutritional Risks: None Diabetes: No  How often do you need to have someone help you when you read instructions, pamphlets, or other written materials from your doctor or pharmacy?: 1 - Never  Diabetic?No  Interpreter Needed?: No  Information entered by :: Orrin Brigham LPN   Activities of Daily Living In your present state of health, do you have any difficulty performing the following activities: 02/28/2021  Hearing? N  Vision? N  Difficulty concentrating or making decisions? Y  Walking or climbing stairs? Y  Dressing or bathing? N  Doing errands, shopping? Y  Preparing Food and eating ? N  Using the Toilet? N  In the past six months, have you accidently leaked urine? Y  Do you have problems with loss of bowel control? N  Managing your Medications? N  Managing your Finances? N  Housekeeping or managing your Housekeeping? Y  Some  recent data might be hidden    Patient Care Team: Tower, Wynelle Fanny, MD as PCP - General [name], [facility] as Pharmacist (Pharmacist)  Indicate any recent Medical Services you may have received from other than [facility] providers in the past year (date may be approximate).     Assessment:   This is a routine wellness examination for Wilhelmine.  Hearing/Vision screen Hearing Screening - Comments:: No issues Vision Screening - Comments:: Last exam 2022, Dr. York Cerise, wears glasses or contacts  Dietary issues and exercise activities discussed: Current Exercise Habits: The patient does not participate in regular exercise at present   Goals Addressed             This Visit's Progress    Patient Stated       Would like to maintain current routine       Depression Screen PHQ 2/9 Scores 02/28/2021 11/30/2019 08/25/2019 08/07/2018 08/05/2017 06/17/2017 04/23/2016  PHQ - 2 Score 0 4 0 0 1 3 0  PHQ- 9 Score - 20 0 0 12 15 -    Fall Risk Fall Risk  02/28/2021 11/30/2019 08/25/2019 08/07/2018 08/05/2017  Falls in the past year? 0 1 1 0 Yes  Comment - - fell getting out of chair - averages 1 fall every 2 mths; lacerations and bruising only   Number falls in past yr: 0 0 0 - 2 or more  Injury with Fall? 0 1 0 - Yes  Risk Factor Category  - - - - High Fall Risk  Risk for fall due to : No Fall Risks History of fall(s) Medication side effect - Impaired balance/gait  Follow up Falls prevention discussed Falls evaluation completed Falls evaluation completed;Falls prevention discussed - -    FALL RISK PREVENTION PERTAINING TO THE HOME:  Any stairs in or around the home? Yes  If so, are there any without handrails? No  Home free of loose throw rugs in walkways, pet beds, electrical cords, etc? Yes  Adequate lighting in your home to reduce risk of falls? Yes   ASSISTIVE DEVICES UTILIZED TO PREVENT FALLS:  Life alert? No  Use of a cane, walker or  w/c? No  Grab bars in the bathroom? No  Shower chair or  bench in shower? Yes  Elevated toilet seat or a handicapped toilet? Yes   TIMED UP AND GO:  Was the test performed? No , visit completed over the phone.    Cognitive Function: Normal cognitive status assessed by this Nurse Health Advisor. No abnormalities found.   MMSE - Mini Mental State Exam 08/25/2019 08/07/2018 08/05/2017 04/23/2016  Orientation to time 5 5 5 5   Orientation to Place 5 5 5 5   Registration 3 3 3 3   Attention/ Calculation 5 0 0 0  Recall 3 3 3 3   Language- name 2 objects - 0 0 0  Language- repeat 1 1 1 1   Language- follow 3 step command - 0 3 3  Language- read & follow direction - 0 0 0  Write a sentence - 0 0 0  Copy design - 0 0 0  Total score - 17 20 20      6CIT Screen 02/28/2021  What Year? 0 points  What month? 0 points  What time? 0 points  Count back from 20 0 points  Months in reverse 0 points  Repeat phrase 0 points  Total Score 0    Immunizations Immunization History  Administered Date(s) Administered   Influenza Inj Mdck Quad Pf 12/16/2017   Influenza Whole 12/31/2006   Influenza,inj,Quad PF,6+ Mos 12/24/2013, 02/20/2016, 10/30/2016, 11/30/2016, 11/26/2018   PFIZER(Purple Top)SARS-COV-2 Vaccination 06/18/2019, 07/13/2019, 03/06/2020   Pneumococcal Polysaccharide-23 09/04/2012   Td 04/23/2007   Tdap 06/17/2017    TDAP status: Up to date  Flu Vaccine status: Due, Education has been provided regarding the importance of this vaccine. Advised may receive this vaccine at local pharmacy or Health Dept. Aware to provide a copy of the vaccination record if obtained from local pharmacy or Health Dept. Verbalized acceptance and understanding.  Pneumococcal vaccine status: Up to date  Covid-19 vaccine status: Information provided on how to obtain vaccines.   Qualifies for Shingles Vaccine? Yes   Zostavax completed No   Shingrix Completed?: No.    Education has been provided regarding the importance of this vaccine. Patient has been advised to call  insurance company to determine out of pocket expense if they have not yet received this vaccine. Advised may also receive vaccine at local pharmacy or Health Dept. Verbalized acceptance and understanding.  Screening Tests Health Maintenance  Topic Date Due   Zoster Vaccines- Shingrix (1 of 2) Never done   COVID-19 Vaccine (4 - Booster for Pfizer series) 05/01/2020   COLONOSCOPY (Pts 45-88yrs Insurance coverage will need to be confirmed)  08/08/2020   INFLUENZA VACCINE  10/30/2020   MAMMOGRAM  11/21/2022   TETANUS/TDAP  06/18/2027   Hepatitis C Screening  Completed   HIV Screening  Completed   Pneumococcal Vaccine 39-76 Years old  [place] Maintenance Due  Topic Date Due   Zoster Vaccines- Shingrix (1 of 2) Never done   COVID-19 Vaccine (4 - Booster for [place] series) 05/01/2020   COLONOSCOPY (Pts 45-106yrs Insurance coverage will need to be confirmed)  08/08/2020   INFLUENZA VACCINE  10/30/2020    Colorectal Cancer screening: due, last completed 08/09/10, patient plans on scheduling an appointment  Mammogram status: Completed 11/20/20. Repeat every year  Bone Density status: Not yet eligible  Lung Cancer Screening: (Low Dose CT Chest recommended if Age 23-80 years, 30 pack-year currently smoking OR have  quit w/in 15years.) does not qualify.     Additional Screening:  Hepatitis C Screening: does qualify; Completed 04/23/16  Vision Screening: Recommended annual ophthalmology exams for early detection of glaucoma and other disorders of the eye. Is the patient up to date with their annual eye exam?  Yes  Who is the provider or what is the name of the office in which the patient attends annual eye exams? Dr. Cleopatra Cedar   Dental Screening: Recommended annual dental exams for proper oral hygiene  Community Resource Referral / Chronic Care Management: CRR required this visit?  No   CCM required this visit?  No      Plan:      I have personally reviewed and noted the following in the patient's chart:   Medical and social history Use of alcohol, tobacco or illicit drugs  Current medications and supplements including opioid prescriptions.  Functional ability and status Nutritional status Physical activity Advanced directives List of other physicians Hospitalizations, surgeries, and ER visits in previous 12 months Vitals Screenings to include cognitive, depression, and falls Referrals and appointments  In addition, I have reviewed and discussed with patient certain preventive protocols, quality metrics, and best practice recommendations. A written personalized care plan for preventive services as well as general preventive health recommendations were provided to patient.   Due to this being a telephonic visit, the after visit summary with patients personalized plan was offered to patient via mail or my-chart. Patient would like to access on my-chart.     Loma Messing, LPN   [id number]   Nurse Health Advisor  Nurse Notes: none

## 2021-03-07 DIAGNOSIS — Z1211 Encounter for screening for malignant neoplasm of colon: Secondary | ICD-10-CM | POA: Diagnosis not present

## 2021-03-12 ENCOUNTER — Telehealth: Payer: Self-pay

## 2021-03-12 NOTE — Progress Notes (Addendum)
Chronic Care Management Pharmacy Assistant   Name: Lindsey [name]  MRN: [id number] DOB: 1955/06/20  Reason for Encounter: CCM (Hypertension Disease State)   Recent office visits:  02/28/2021 - Orrin Brigham, LPN - Patient presented for Medicare Annual Wellness exam. No medication changes.   Recent consult visits:  02/13/2021 - Urology - Patient presented for chronic intrastitial cystitis. No information given.   Hospital visits:  None in previous 6 months  Medications: Outpatient Encounter Medications as of 03/12/2021  Medication Sig   atenolol (TENORMIN) 100 MG tablet TAKE 1 TABLET BY MOUTH EVERY DAY   baclofen (LIORESAL) 20 MG tablet Take 20 mg by mouth daily as needed for muscle spasms.   botulinum toxin Type A (BOTOX) 100 units SOLR injection Inject 200 Units into the muscle every 3 (three) months.   calcium carbonate (TUMS - DOSED IN MG ELEMENTAL CALCIUM) 500 MG chewable tablet Chew 1 tablet by mouth daily as needed for indigestion or heartburn.   cholecalciferol (VITAMIN D) 1000 UNITS tablet Take 5,000 Units by mouth daily.    diclofenac sodium (VOLTAREN) 1 % GEL Apply 2 to 4 gram to affected joints up to 4 times daily   doxycycline (VIBRAMYCIN) 100 MG capsule Take 100 mg by mouth 2 (two) times daily.   DULoxetine (CYMBALTA) 60 MG capsule TAKE 1 CAPSULE BY MOUTH EVERY DAY   ezetimibe (ZETIA) 10 MG tablet Take 1 tablet (10 mg total) by mouth daily.   famotidine (PEPCID) 20 MG tablet Take 20 mg by mouth 2 (two) times daily as needed for heartburn or indigestion.   fentaNYL (DURAGESIC - DOSED MCG/HR) 50 MCG/HR Place 50 mcg onto the skin every other day.   hydrOXYzine (ATARAX/VISTARIL) 50 MG tablet Take 100 mg by mouth at bedtime.   lidocaine (XYLOCAINE) 5 % ointment APPLY TO AFFECTED AREA EVERY DAY AS DIRECTED   LUTEIN PO Take 1 tablet by mouth daily.   nitrofurantoin (MACRODANTIN) 50 MG capsule Take 50 mg by mouth at bedtime.   polyethylene glycol powder (GLYCOLAX/MIRALAX) 17  GM/SCOOP powder Take by mouth.   rosuvastatin (CRESTOR) 5 MG tablet Take one pill twice weekly by mouth (Patient not taking: Reported on 01/02/2021)   No facility-administered encounter medications on file as of 03/12/2021.     Recent Office Vitals: BP Readings from Last 3 Encounters:  01/02/21 (!) 147/87  11/06/20 124/62  07/03/20 128/83   Pulse Readings from Last 3 Encounters:  01/02/21 63  11/06/20 63  07/03/20 69    Wt Readings from Last 3 Encounters:  02/28/21 189 lb (85.7 kg)  01/02/21 189 lb 6.4 oz (85.9 kg)  11/06/20 184 lb 6 oz (83.6 kg)    Kidney Function Lab Results  Component Value Date/Time   CREATININE 0.89 11/24/2019 12:05 PM   CREATININE 0.70 08/10/2018 12:34 PM   GFR 64.04 11/24/2019 12:05 PM   GFRNONAA 79.88 05/17/2009 04:38 PM   GFRAA 98 04/23/2007 11:26 AM   BMP Latest Ref Rng & Units 11/24/2019 08/10/2018 06/17/2017  Glucose 70 - 99 mg/dL 128(H) 110(H) 113(H)  BUN 6 - 23 mg/dL 10 10 7   Creatinine 0.40 - 1.20 mg/dL 0.89 0.70 0.70  Sodium 135 - 145 mEq/L 134(L) 137 136  Potassium 3.5 - 5.1 mEq/L 4.9 4.8 4.9  Chloride 96 - 112 mEq/L 97 100 100  CO2 19 - 32 mEq/L 32 30 31  Calcium 8.4 - 10.5 mg/dL 9.8 9.7 10.2   Contacted patient on 03/16/2021 to discuss hypertension disease state  Current  antihypertensive regimen:  Atenolol 100mg  - Take 1 tablet by mouth every day.   Patient verbally confirms she is taking the above medications as directed. Yes  How often are you checking your Blood Pressure? Patient states she just bought a new blood pressure cuff for $60 and it is not working. It will only show her the top number. Patient asked for me to call her back next week. I advised patient I would call back on 12/22.  Wrist or arm cuff: Arm Caffeine intake: Drinks green tea and 1 cup of coffee Salt intake: Does not add salt OTC medications including pseudoephedrine or NSAIDs? No  What recent interventions/DTPs have been made by any provider to improve  Blood Pressure control since last CPP Visit: No recent interventions  Any recent hospitalizations or ED visits since last visit with CPP? No  What diet changes have been made to improve Blood Pressure Control?  Patient has cut back on sugar.  What exercise is being done to improve your Blood Pressure Control?  Patient is walking more.   Adherence Review: Is the patient currently on ACE/ARB medication? No Does the patient have >5 day gap between last estimated fill dates? No  Star Rating Drugs:  Medication:  Last Fill: Day Supply Zetia 10mg                  02/06/2021    90  Care Gaps: Annual wellness visit in last year? Yes 02/28/2021 Most Recent BP reading: 147/87 on 01/02/2021  If Diabetic: Most recent A1C reading:147/87 on 01/02/2021 Last eye exam / retinopathy screening: Up to date Last diabetic foot exam: Up to date  No appointments scheduled within the next 30 days.  [name], CPP notified  [name], [place] Clinical Pharmacy Assistant [phone]  Time Spent:  24 Minutes  I have reviewed the care management and care coordination activities outlined in this encounter and I am certifying that I agree with the content of this note. No further action required.  [name], [name] Clinical Pharmacist [facility] at [facility] [phone]

## 2021-03-22 NOTE — Progress Notes (Addendum)
Called patient to get her blood pressure readings as previously discussed.   Blood pressure readings are as follows: 12/22 - 133/79 12/21 - 147/87 12/20 - 158/100  Lindsey [name], CPP notified  [name], [place] Clinical Pharmacy Assistant [phone]  Time Spent:  5 Minutes

## 2021-03-25 ENCOUNTER — Other Ambulatory Visit: Payer: Self-pay | Admitting: Physician Assistant

## 2021-03-27 NOTE — Telephone Encounter (Signed)
Next Visit: 07/03/2021  Last Visit: 01/02/2021  Last Fill: 12/25/2020  DX: Fibromyalgia  Current Dose per office note 01/02/2021: Cymbalta 60 mg 1 capsule by mouth daily.  Okay to refill Cymbalta?

## 2021-03-29 DIAGNOSIS — G4733 Obstructive sleep apnea (adult) (pediatric): Secondary | ICD-10-CM | POA: Diagnosis not present

## 2021-04-02 NOTE — Telephone Encounter (Signed)
Amy, please advise patient to bring her BP monitor to her next appointment to verify accuracy. Her BP goal is less than 140/90. If consistently higher, rest, recheck and please call and let us know. Thank you!

## 2021-04-03 DIAGNOSIS — H47323 Drusen of optic disc, bilateral: Secondary | ICD-10-CM | POA: Diagnosis not present

## 2021-04-03 DIAGNOSIS — H353131 Nonexudative age-related macular degeneration, bilateral, early dry stage: Secondary | ICD-10-CM | POA: Diagnosis not present

## 2021-04-04 ENCOUNTER — Encounter: Payer: Self-pay | Admitting: Family Medicine

## 2021-04-04 ENCOUNTER — Telehealth: Payer: Self-pay

## 2021-04-04 NOTE — Progress Notes (Addendum)
° ° °  Chronic Care Management Pharmacy Assistant   Name: Lindsey [name]  MRN: [id number] DOB: [birth year]   Reason for Encounter: CCM (Blood pressure follow-up)  Called patient to advise her to bring her blood pressure monitor to her next doctor's appointment so it could be checked for accuracy. Informed patient we would like her blood pressure to be less than 140/90. Asked patient to call us if she consistently gets a high reading after rechecking. Counseled patient on how to take her blood pressure for an accurate reading. Patient voiced understanding. I asked patient to call me if she needs anything or if her blood pressure is high.   [name], CPP notified  [name], [place] Clinical Pharmacy Assistant [phone]  Time Spent: 10 Minutes  I have reviewed the care management and care coordination activities outlined in this encounter and I am certifying that I agree with the content of this note. No further action required.  [name], [name] Clinical Pharmacist [facility] at [facility] [phone]

## 2021-04-04 NOTE — Progress Notes (Signed)
Please see encounter from 04/04/2021 for follow-up to this note.

## 2021-04-09 DIAGNOSIS — M542 Cervicalgia: Secondary | ICD-10-CM | POA: Diagnosis not present

## 2021-04-09 DIAGNOSIS — G518 Other disorders of facial nerve: Secondary | ICD-10-CM | POA: Diagnosis not present

## 2021-04-09 DIAGNOSIS — G43719 Chronic migraine without aura, intractable, without status migrainosus: Secondary | ICD-10-CM | POA: Diagnosis not present

## 2021-04-10 DIAGNOSIS — H47323 Drusen of optic disc, bilateral: Secondary | ICD-10-CM | POA: Diagnosis not present

## 2021-04-13 ENCOUNTER — Other Ambulatory Visit: Payer: Self-pay | Admitting: Family Medicine

## 2021-04-13 DIAGNOSIS — Z1231 Encounter for screening mammogram for malignant neoplasm of breast: Secondary | ICD-10-CM

## 2021-04-13 DIAGNOSIS — E2839 Other primary ovarian failure: Secondary | ICD-10-CM

## 2021-04-29 ENCOUNTER — Other Ambulatory Visit: Payer: Self-pay | Admitting: Physician Assistant

## 2021-04-29 DIAGNOSIS — G4733 Obstructive sleep apnea (adult) (pediatric): Secondary | ICD-10-CM | POA: Diagnosis not present

## 2021-05-02 ENCOUNTER — Other Ambulatory Visit: Payer: Medicare Other

## 2021-05-09 DIAGNOSIS — G4733 Obstructive sleep apnea (adult) (pediatric): Secondary | ICD-10-CM | POA: Diagnosis not present

## 2021-05-14 DIAGNOSIS — G8929 Other chronic pain: Secondary | ICD-10-CM | POA: Diagnosis not present

## 2021-05-14 DIAGNOSIS — N3010 Interstitial cystitis (chronic) without hematuria: Secondary | ICD-10-CM | POA: Diagnosis not present

## 2021-05-20 ENCOUNTER — Other Ambulatory Visit: Payer: Self-pay | Admitting: Family Medicine

## 2021-06-19 NOTE — Progress Notes (Signed)
Office Visit Note  Patient: Lindsey [name]             Date of Birth: [birth year]           MRN: [id number]             PCP: Judy Pimple, MD Referring: Tower, Audrie Gallus, MD Visit Date: 07/03/2021 Occupation: @GUAROCC @  Subjective:  Generalized pain  History of Present Illness: Lindsey [name] is a 66 y.o. female with history of fibromyalgia, osteoarthritis and osteopenia.  She continues to have generalized pain and discomfort from fibromyalgia.  She has been experiencing increased fatigue.  She states she was diagnosed with sleep apnea and hypersomnolence.  She tried CPAP but could not tolerate it.  She continues to have some pain and stiffness in her hands.  The left trigger thumb has resolved.  She has ongoing pain in bilateral knee joints.  Her DEXA scan is pending for August.  She has been seeing Dr. Logan Bores for interstitial cystitis.  She takes Cymbalta 60 mg p.o. daily.  Activities of Daily Living:  Patient reports morning stiffness for all day. Patient Reports nocturnal pain.  Difficulty dressing/grooming: Denies Difficulty climbing stairs: Reports Difficulty getting out of chair: Reports Difficulty using hands for taps, buttons, cutlery, and/or writing: Reports  Review of Systems  Constitutional:  Positive for fatigue.  HENT:  Positive for mouth sores, mouth dryness and nose dryness.   Eyes:  Positive for dryness. Negative for pain and itching.  Respiratory:  Positive for shortness of breath. Negative for difficulty breathing.   Cardiovascular:  Negative for chest pain and palpitations.  Gastrointestinal:  Positive for constipation. Negative for blood in stool and diarrhea.  Endocrine: Negative for increased urination.  Genitourinary:  Negative for difficulty urinating.  Musculoskeletal:  Positive for joint pain, joint pain, myalgias, morning stiffness, muscle tenderness and myalgias. Negative for joint swelling.  Skin:  Positive for rash.  Allergic/Immunologic: Positive for  susceptible to infections.  Neurological:  Positive for headaches and memory loss. Negative for dizziness, numbness and weakness.  Hematological:  Negative for bruising/bleeding tendency.  Psychiatric/Behavioral:  Positive for confusion.    PMFS History:  Patient Active Problem List   Diagnosis Date Noted   Colon cancer screening 11/06/2020   Elevated TSH 05/29/2020   Prediabetes 08/09/2018   Estrogen deficiency 01/28/2018   Autoimmune disease (HCC) 06/18/2017   History of recurrent cystitis (IC) 08/16/2016   Pelvic floor dysfunction 08/16/2016   Dysuria 05/24/2016   Macular degeneration 04/09/2016   Osteoarthritis, knee 02/15/2016   S/P laparoscopic cholecystectomy June 2017 09/26/2015   Dermatitis 10/31/2014   Encounter for Medicare annual wellness exam 09/04/2012   HSV (herpes simplex virus) infection 04/08/2012   IBS (irritable bowel syndrome) 10/21/2011   Routine general medical examination at a health care facility 07/02/2011   Osteopenia 05/17/2009   Depressed mood 10/05/2008   GERD 05/05/2008   OSA (obstructive sleep apnea) 06/25/2007   Hyperlipidemia 05/31/2007   Essential hypertension 05/31/2007   Other fatigue 04/23/2007   Vitamin D deficiency 01/22/2007   Migraine headache 01/22/2007   Interstitial cystitis 01/22/2007   MENOPAUSE-RELATED VASOMOTOR SYMPTOMS 01/22/2007   OSTEOARTHROSIS, GENERALIZED, MULTIPLE SITES 01/22/2007   Fibromyalgia 01/22/2007   PATENT FORAMEN OVALE 01/22/2007    Past Medical History:  Diagnosis Date   Arthritis    Cataract    Chronic fatigue    Early onset macular degeneration    Fibromyalgia    Hemorrhoids    Hyperglycemia 12/24/2013   Hyperlipidemia  Hypersomnia    Hypertension    IBS (irritable bowel syndrome)    Interstitial cystitis    Migraines    OSA (obstructive sleep apnea)    Osteopenia    Pelvic floor dysfunction    PFO (patent foramen ovale)    Tendonitis    hand   Vitamin D deficiency     Family History   Problem Relation Age of Onset   Diabetes Father    Alcohol abuse Father    Heart disease Father    Fibromyalgia Mother    Colon cancer Paternal Uncle    Stomach cancer Paternal Grandfather    Diabetes Paternal Grandfather    Diabetes Paternal Grandmother    Stroke Paternal Grandmother    Breast cancer Neg Hx    Past Surgical History:  Procedure Laterality Date   ANKLE ARTHROSCOPY Right    ANKLE FRACTURE SURGERY Left    BLADDER SURGERY     and hydrodistension   BREAST EXCISIONAL BIOPSY Bilateral    Multiple Benign Lumps removed     BREAST LUMPECTOMY     many, bilateral   CERVICAL DISCECTOMY  2006   fusion and plating   CHOLECYSTECTOMY N/A 09/26/2015   Procedure: LAPAROSCOPIC CHOLECYSTECTOMY WITH INTRAOPERATIVE CHOLANGIOGRAM;  Surgeon: Luretha Murphy, MD;  Location: [facility] ORS;  Service: General;  Laterality: N/A;   COLONOSCOPY     FRACTURE SURGERY     ankle fracture x 2   NASAL SINUS SURGERY     multiple, x4   UPPER GASTROINTESTINAL ENDOSCOPY     VAGINAL HYSTERECTOMY  1990   because of the IC partial   Social History   Social History Narrative   Not on file   Immunization History  Administered Date(s) Administered   Influenza Inj Mdck Quad Pf 12/16/2017   Influenza Whole 12/31/2006   Influenza,inj,Quad PF,6+ Mos 12/24/2013, 02/20/2016, 10/30/2016, 11/30/2016, 11/26/2018   PFIZER(Purple Top)SARS-COV-2 Vaccination 06/18/2019, 07/13/2019, 03/06/2020   Pneumococcal Polysaccharide-23 09/04/2012   Td 04/23/2007   Tdap 06/17/2017     Objective: Vital Signs: BP (!) 146/89 (BP Location: Left Arm, Patient Position: Sitting, Cuff Size: Normal)   Pulse 61   Ht 5\' 4"  (1.626 m)   Wt 188 lb 12.8 oz (85.6 kg)   BMI 32.41 kg/m    Physical Exam Vitals and nursing note reviewed.  Constitutional:      Appearance: She is well-developed.  HENT:     Head: Normocephalic and atraumatic.  Eyes:     Conjunctiva/sclera: Conjunctivae normal.  Cardiovascular:     Rate and Rhythm:  Normal rate and regular rhythm.     Heart sounds: Normal heart sounds.  Pulmonary:     Effort: Pulmonary effort is normal.     Breath sounds: Normal breath sounds.  Abdominal:     General: Bowel sounds are normal.     Palpations: Abdomen is soft.  Musculoskeletal:     Cervical back: Normal range of motion.  Lymphadenopathy:     Cervical: No cervical adenopathy.  Skin:    General: Skin is warm and dry.     Capillary Refill: Capillary refill takes less than 2 seconds.  Neurological:     Mental Status: She is alert and oriented to person, place, and time.  Psychiatric:        Behavior: Behavior normal.     Musculoskeletal Exam: C-spine was in good range of motion.  Shoulder joints, elbow joints, wrist joints with good range of motion.  She had bilateral PIP and DIP thickening with  no synovitis.  Hip joints and knee joints in good range of motion.  She has crepitus in the bilateral knee joints without any warmth swelling or effusion.  There was no tenderness over ankles or MTPs.  She had generalized hyperalgesia and positive tender points.  CDAI Exam: CDAI Score: -- Patient Global: --; Provider Global: -- Swollen: --; Tender: -- Joint Exam 07/03/2021   No joint exam has been documented for this visit   There is currently no information documented on the homunculus. Go to the Rheumatology activity and complete the homunculus joint exam.  Investigation: No additional findings.  Imaging: No results found.  Recent Labs: Lab Results  Component Value Date   WBC 6.1 11/24/2019   HGB 14.2 11/24/2019   PLT 206.0 11/24/2019   NA 134 (L) 11/24/2019   K 4.9 11/24/2019   CL 97 11/24/2019   CO2 32 11/24/2019   GLUCOSE 128 (H) 11/24/2019   BUN 10 11/24/2019   CREATININE 0.89 11/24/2019   BILITOT 0.5 11/24/2019   ALKPHOS 80 11/24/2019   AST 14 12/19/2020   ALT 17 12/19/2020   PROT 6.8 11/24/2019   ALBUMIN 3.9 11/24/2019   CALCIUM 9.8 11/24/2019   GFRAA 98 04/23/2007     Speciality Comments: No specialty comments available.  Procedures:  No procedures performed Allergies: Buspirone hcl, Crestor [rosuvastatin], Simvastatin, and Sulfa antibiotics   Assessment / Plan:     Visit Diagnoses: Fibromyalgia-she continues to have generalized pain and positive tender points.  She also complains of increased fatigue.  Need for regular exercise and stretching was emphasized.  She takes Cymbalta which helps her with the symptoms.  Primary insomnia - She underwent nocturnal polysomnography on 02/14/2020 followed by a home sleep test on 04/19/2020.  She could not tolerate CPAP.  She will have follow-up visit.  Other fatigue-she relates it to sleep apnea and fibromyalgia.  Primary osteoarthritis of both hands-bilateral PIP and DIP thickening was noted.  Joint protection muscle strengthening was discussed.  Trigger thumb, left thumb-resolved.  Chondromalacia of both patellae -she continues to have ongoing pain and discomfort in her knee joints.  X-rays of both knees were updated on 12/31/2018.  X-rays were consistent with bilateral moderate chondromalacia.  She had viscosupplement injections in the past without much results.  Weight loss diet and exercise was emphasized.  Lower extremity muscle strengthening exercises were discussed and a handout was placed in the AVS.  Osteopenia of multiple sites - DEXA 05/13/18: The BMD measured at Femur Neck Left is 0.793 g/cm2 with a T-score of -1.8.  She has a DEXA scheduled in August 2023.  Use of calcium rich diet and vitamin D was discussed.  Resistive exercises were discussed.  History of vitamin D deficiency-patient states that her vitamin D has been stable and followed by her PCP.  History of hypertension-her blood pressure was elevated today.  She was advised to monitor blood pressure closely and follow-up with the PCP.  Other medical problems are listed as follows:  History of hyperlipidemia  History of  hyperglycemia  S/P laparoscopic cholecystectomy June 2017  History of IBS  History of depression - Cymbalta 60 mg 1 capsule by mouth daily.  Interstitial cystitis - She is followed by Dr. Logan Bores.   OSA (obstructive sleep apnea)  Orders: No orders of the defined types were placed in this encounter.  No orders of the defined types were placed in this encounter.    Follow-Up Instructions: Return in about 6 months (around 01/02/2022) for Osteoarthritis.  Pollyann Savoy, MD  Note - This record has been created using Animal nutritionist.  Chart creation errors have been sought, but may not always  have been located. Such creation errors do not reflect on  the standard of medical care.

## 2021-06-25 ENCOUNTER — Other Ambulatory Visit: Payer: Self-pay | Admitting: Physician Assistant

## 2021-06-25 ENCOUNTER — Telehealth: Payer: Self-pay

## 2021-06-25 NOTE — Progress Notes (Signed)
? ? ?Chronic Care Management ?Pharmacy Assistant  ? ?Name: Lindsey [name]  MRN: [id number] DOB: 02-27-1956 ? ?Reason for Encounter: CCM (General Adherence) ?  ?Recent office visits:  ?None since last CCM contact ? ?Recent consult visits:  ?06/14/2021 Lindsey Levering, MD (Otolaryngology): Referral to ENT ?05/14/2021 Lindsey Binet, NP (Urology) Pain management Refills provided. ? ?Hospital visits:  ?None in previous 6 months ? ?Medications: ?Outpatient Encounter Medications as of 06/25/2021  ?Medication Sig  ? atenolol (TENORMIN) 100 MG tablet TAKE 1 TABLET BY MOUTH EVERY DAY  ? baclofen (LIORESAL) 20 MG tablet Take 20 mg by mouth daily as needed for muscle spasms.  ? botulinum toxin Type A (BOTOX) 100 units SOLR injection Inject 200 Units into the muscle every 3 (three) months.  ? calcium carbonate (TUMS - DOSED IN MG ELEMENTAL CALCIUM) 500 MG chewable tablet Chew 1 tablet by mouth daily as needed for indigestion or heartburn.  ? cholecalciferol (VITAMIN D) 1000 UNITS tablet Take 5,000 Units by mouth daily.   ? diclofenac sodium (VOLTAREN) 1 % GEL Apply 2 to 4 gram to affected joints up to 4 times daily  ? doxycycline (VIBRAMYCIN) 100 MG capsule Take 100 mg by mouth 2 (two) times daily.  ? DULoxetine (CYMBALTA) 60 MG capsule TAKE 1 CAPSULE BY MOUTH EVERY DAY  ? ezetimibe (ZETIA) 10 MG tablet Take 1 tablet (10 mg total) by mouth daily.  ? famotidine (PEPCID) 20 MG tablet Take 20 mg by mouth 2 (two) times daily as needed for heartburn or indigestion.  ? fentaNYL (DURAGESIC - DOSED MCG/HR) 50 MCG/HR Place 50 mcg onto the skin every other day.  ? hydrOXYzine (ATARAX/VISTARIL) 50 MG tablet Take 100 mg by mouth at bedtime.  ? lidocaine (XYLOCAINE) 5 % ointment APPLY TO AFFECTED AREA EVERY DAY AS DIRECTED  ? LUTEIN PO Take 1 tablet by mouth daily.  ? nitrofurantoin (MACRODANTIN) 50 MG capsule Take 50 mg by mouth at bedtime.  ? polyethylene glycol powder (GLYCOLAX/MIRALAX) 17 GM/SCOOP powder Take by mouth.  ? rosuvastatin  (CRESTOR) 5 MG tablet Take one pill twice weekly by mouth (Patient not taking: Reported on 01/02/2021)  ? ?No facility-administered encounter medications on file as of 06/25/2021.  ? ?Contacted Lindsey [name] on 06/25/2021 for general disease state and medication adherence call.  ? ?Patient is not more than 5 days past due for refill on the following medications per chart history: ? ?Star Medications: ?Medication Name/mg Last Fill Days Supply ?Zetia 10 mg   05/11/2021 90   ? ?What concerns do you have about your medications? Patient stated the cost of Botox is $500 every three months and her Fentanyl patches is $100 a month. Both are very expensive for the patient.  ? ?The patient denies side effects with their medications.  ? ?How often do you forget or accidentally miss a dose? Never  ? ?Do you use a pillbox? No Patient uses a cup and takes all of her medications at bedtime every night.  ? ?Are you having any problems getting your medications from your pharmacy? No ? ?Has the cost of your medications been a concern? Yes Patient stated the cost of Botox is $500 every three months and her Fentanyl patches is $100 a month. Both are very expensive for the patient. I discussed patient assistant with the patient. Fentanyl does not have any patient assistance programs available. I discussed at length patient assistance for Botox with [employer] with the patient. She would like to proceed with the process.  ? ?  Since last visit with CPP, no interventions have been made.  ? ?The patient has not had an ED visit since last contact.  ? ?The patient denies problems with their health.  ? ?Patient denies concerns or questions for Lindsey [name], [name] at this time.  ? ?Care Gaps: ?Annual wellness visit in last year? Yes 02/28/2021 ?Most Recent BP reading: 147/87 on 01/02/2021 ? ?Upcoming appointments: ?No appointments scheduled within the next 30 days. ? ?Lindsey [name], CPP notified ? ?Lindsey [name], [name] ?Clinical Pharmacy  Assistant ?[phone] ? ? ? ? ? ? ? ? ?

## 2021-06-25 NOTE — Telephone Encounter (Signed)
Next Visit: 07/03/2021 ?  ?Last Visit: 01/02/2021 ?  ?Last Fill:03/27/2021 ?  ?DX: Fibromyalgia ?  ?Current Dose per office note 01/02/2021: Cymbalta 60 mg 1 capsule by mouth daily. ?  ?Okay to refill Cymbalta?  ?

## 2021-06-29 ENCOUNTER — Telehealth: Payer: Self-pay

## 2021-06-29 NOTE — Progress Notes (Signed)
? ? ?  Chronic Care Management ?Pharmacy Assistant  ? ?Name: Lindsey [name]  MRN: [id number] DOB: Jan 19, 1956 ? ? ?Reason for Encounter: CCM (Botox PAP) ?  ?Botox patient assistance form has been completed and uploaded. I called to let patient her know she could pick them up at the front desk. I advised patient that because she receives her Botox and treatments at the [facility] she would need to take her forms to them for completion. I also stated because the Botox would need to be delivered to their offices that any concerns with her Botox assistance (application, delivery, etc) would need to be addressed by [facility]. Advised her to take the assistance forms, the form with her insurance card and medication list (we printed for her) and proof of income with her.  ?Patient voiced understanding.  ? ?[name], CPP notified ? ?[name], [name] ?Clinical Pharmacy Assistant ?[phone] ? ? ?

## 2021-07-03 ENCOUNTER — Ambulatory Visit: Payer: Medicare Other | Admitting: Rheumatology

## 2021-07-03 ENCOUNTER — Encounter: Payer: Self-pay | Admitting: Rheumatology

## 2021-07-03 VITALS — BP 146/89 | HR 61 | Ht 64.0 in | Wt 188.8 lb

## 2021-07-03 DIAGNOSIS — R5383 Other fatigue: Secondary | ICD-10-CM | POA: Diagnosis not present

## 2021-07-03 DIAGNOSIS — M8589 Other specified disorders of bone density and structure, multiple sites: Secondary | ICD-10-CM | POA: Diagnosis not present

## 2021-07-03 DIAGNOSIS — R413 Other amnesia: Secondary | ICD-10-CM

## 2021-07-03 DIAGNOSIS — Z9049 Acquired absence of other specified parts of digestive tract: Secondary | ICD-10-CM

## 2021-07-03 DIAGNOSIS — Z8719 Personal history of other diseases of the digestive system: Secondary | ICD-10-CM | POA: Diagnosis not present

## 2021-07-03 DIAGNOSIS — M2242 Chondromalacia patellae, left knee: Secondary | ICD-10-CM

## 2021-07-03 DIAGNOSIS — M65312 Trigger thumb, left thumb: Secondary | ICD-10-CM | POA: Diagnosis not present

## 2021-07-03 DIAGNOSIS — M19041 Primary osteoarthritis, right hand: Secondary | ICD-10-CM | POA: Diagnosis not present

## 2021-07-03 DIAGNOSIS — F5101 Primary insomnia: Secondary | ICD-10-CM

## 2021-07-03 DIAGNOSIS — N3010 Interstitial cystitis (chronic) without hematuria: Secondary | ICD-10-CM

## 2021-07-03 DIAGNOSIS — M797 Fibromyalgia: Secondary | ICD-10-CM

## 2021-07-03 DIAGNOSIS — G4733 Obstructive sleep apnea (adult) (pediatric): Secondary | ICD-10-CM

## 2021-07-03 DIAGNOSIS — Z8679 Personal history of other diseases of the circulatory system: Secondary | ICD-10-CM

## 2021-07-03 DIAGNOSIS — Z8659 Personal history of other mental and behavioral disorders: Secondary | ICD-10-CM

## 2021-07-03 DIAGNOSIS — M2241 Chondromalacia patellae, right knee: Secondary | ICD-10-CM | POA: Diagnosis not present

## 2021-07-03 DIAGNOSIS — M19042 Primary osteoarthritis, left hand: Secondary | ICD-10-CM

## 2021-07-03 DIAGNOSIS — Z8639 Personal history of other endocrine, nutritional and metabolic disease: Secondary | ICD-10-CM

## 2021-07-03 NOTE — Patient Instructions (Signed)
Knee Exercises ?Ask your health care provider which exercises are safe for you. Do exercises exactly as told by your health care provider and adjust them as directed. It is normal to feel mild stretching, pulling, tightness, or discomfort as you do these exercises. Stop right away if you feel sudden pain or your pain gets worse. Do not begin these exercises until told by your health care provider. ?Stretching and range-of-motion exercises ?These exercises warm up your muscles and joints and improve the movement and flexibility of your knee. These exercises also help to relieve pain and swelling. ?Knee extension, prone ? ?Lie on your abdomen (prone position) on a bed. ?Place your left / right knee just beyond the edge of the surface so your knee is not on the bed. You can put a towel under your left / right thigh just above your kneecap for comfort. ?Relax your leg muscles and allow gravity to straighten your knee (extension). You should feel a stretch behind your left / right knee. ?Hold this position for __________ seconds. ?Scoot up so your knee is supported between repetitions. ?Repeat __________ times. Complete this exercise __________ times a day. ?Knee flexion, active ? ?Lie on your back with both legs straight. If this causes back discomfort, bend your left / right knee so your foot is flat on the floor. ?Slowly slide your left / right heel back toward your buttocks. Stop when you feel a gentle stretch in the front of your knee or thigh (flexion). ?Hold this position for __________ seconds. ?Slowly slide your left / right heel back to the starting position. ?Repeat __________ times. Complete this exercise __________ times a day. ?Quadriceps stretch, prone ? ?Lie on your abdomen on a firm surface, such as a bed or padded floor. ?Bend your left / right knee and hold your ankle. If you cannot reach your ankle or pant leg, loop a belt around your foot and grab the belt instead. ?Gently pull your heel toward your  buttocks. Your knee should not slide out to the side. You should feel a stretch in the front of your thigh and knee (quadriceps). ?Hold this position for __________ seconds. ?Repeat __________ times. Complete this exercise __________ times a day. ?Hamstring, supine ? ?Lie on your back (supine position). ?Loop a belt or towel over the ball of your left / right foot. The ball of your foot is on the walking surface, right under your toes. ?Straighten your left / right knee and slowly pull on the belt to raise your leg until you feel a gentle stretch behind your knee (hamstring). ?Do not let your knee bend while you do this. ?Keep your other leg flat on the floor. ?Hold this position for __________ seconds. ?Repeat __________ times. Complete this exercise __________ times a day. ?Strengthening exercises ?These exercises build strength and endurance in your knee. Endurance is the ability to use your muscles for a long time, even after they get tired. ?Quadriceps, isometric ?This exercise strengthens the muscles in front of your thigh (quadriceps) without moving your knee joint (isometric). ?Lie on your back with your left / right leg extended and your other knee bent. Put a rolled towel or small pillow under your knee if told by your health care provider. ?Slowly tense the muscles in the front of your left / right thigh. You should see your kneecap slide up toward your hip or see increased dimpling just above the knee. This motion will push the back of the knee toward the floor. ?  For __________ seconds, hold the muscle as tight as you can without increasing your pain. ?Relax the muscles slowly and completely. ?Repeat __________ times. Complete this exercise __________ times a day. ?Straight leg raises ?This exercise strengthens the muscles in front of your thigh (quadriceps) and the muscles that move your hips (hip flexors). ?Lie on your back with your left / right leg extended and your other knee bent. ?Tense the  muscles in the front of your left / right thigh. You should see your kneecap slide up or see increased dimpling just above the knee. Your thigh may even shake a bit. ?Keep these muscles tight as you raise your leg 4-6 inches (10-15 cm) off the floor. Do not let your knee bend. ?Hold this position for __________ seconds. ?Keep these muscles tense as you lower your leg. ?Relax your muscles slowly and completely after each repetition. ?Repeat __________ times. Complete this exercise __________ times a day. ?Hamstring, isometric ? ?Lie on your back on a firm surface. ?Bend your left / right knee about __________ degrees. ?Dig your left / right heel into the surface as if you are trying to pull it toward your buttocks. Tighten the muscles in the back of your thighs (hamstring) to "dig" as hard as you can without increasing any pain. ?Hold this position for __________ seconds. ?Release the tension gradually and allow your muscles to relax completely for __________ seconds after each repetition. ?Repeat __________ times. Complete this exercise __________ times a day. ?Hamstring curls ?If told by your health care provider, do this exercise while wearing ankle weights. Begin with __________lb / kg weights. Then increase the weight by 1 lb (0.5 kg) increments. Do not wear ankle weights that are more than __________lb / kg. ?Lie on your abdomen with your legs straight. ?Bend your left / right knee as far as you can without feeling pain. Keep your hips flat against the floor. ?Hold this position for __________ seconds. ?Slowly lower your leg to the starting position. ?Repeat __________ times. Complete this exercise __________ times a day. ?Squats ?This exercise strengthens the muscles in front of your thigh and knee (quadriceps). ?Stand in front of a table, with your feet and knees pointing straight ahead. You may rest your hands on the table for balance but not for support. ?Slowly bend your knees and lower your hips like you  are going to sit in a chair. ?Keep your weight over your heels, not over your toes. ?Keep your lower legs upright so they are parallel with the table legs. ?Do not let your hips go lower than your knees. ?Do not bend lower than told by your health care provider. ?If your knee pain increases, do not bend as low. ?Hold the squat position for __________ seconds. ?Slowly push with your legs to return to standing. Do not use your hands to pull yourself to standing. ?Repeat __________ times. Complete this exercise __________ times a day. ?Wall slides ?This exercise strengthens the muscles in front of your thigh and knee (quadriceps). ?Lean your back against a smooth wall or door, and walk your feet out 18-24 inches (46-61 cm) from it. ?Place your feet hip-width apart. ?Slowly slide down the wall or door until your knees bend __________ degrees. Keep your knees over your heels, not over your toes. Keep your knees in line with your hips. ?Hold this position for __________ seconds. ?Repeat __________ times. Complete this exercise __________ times a day. ?Straight leg raises, side-lying ?This exercise strengthens the muscles that rotate   the leg at the hip and move it away from your body (hip abductors). ?Lie on your side with your left / right leg in the top position. Lie so your head, shoulder, knee, and hip line up. You may bend your bottom knee to help you keep your balance. ?Roll your hips slightly forward so your hips are stacked directly over each other and your left / right knee is facing forward. ?Leading with your heel, lift your top leg 4-6 inches (10-15 cm). You should feel the muscles in your outer hip lifting. ?Do not let your foot drift forward. ?Do not let your knee roll toward the ceiling. ?Hold this position for __________ seconds. ?Slowly return your leg to the starting position. ?Let your muscles relax completely after each repetition. ?Repeat __________ times. Complete this exercise __________ times a  day. ?Straight leg raises, prone ?This exercise stretches the muscles that move your hips away from the front of the pelvis (hip extensors). ?Lie on your abdomen on a firm surface. You can put a pillow under yo

## 2021-07-12 DIAGNOSIS — G43719 Chronic migraine without aura, intractable, without status migrainosus: Secondary | ICD-10-CM | POA: Diagnosis not present

## 2021-07-12 DIAGNOSIS — G518 Other disorders of facial nerve: Secondary | ICD-10-CM | POA: Diagnosis not present

## 2021-07-12 DIAGNOSIS — M542 Cervicalgia: Secondary | ICD-10-CM | POA: Diagnosis not present

## 2021-08-09 DIAGNOSIS — Z9889 Other specified postprocedural states: Secondary | ICD-10-CM | POA: Diagnosis not present

## 2021-08-09 DIAGNOSIS — G4733 Obstructive sleep apnea (adult) (pediatric): Secondary | ICD-10-CM | POA: Diagnosis not present

## 2021-08-09 DIAGNOSIS — J343 Hypertrophy of nasal turbinates: Secondary | ICD-10-CM | POA: Diagnosis not present

## 2021-08-20 DIAGNOSIS — N3010 Interstitial cystitis (chronic) without hematuria: Secondary | ICD-10-CM | POA: Diagnosis not present

## 2021-09-11 ENCOUNTER — Other Ambulatory Visit: Payer: Self-pay | Admitting: Otolaryngology

## 2021-09-21 ENCOUNTER — Other Ambulatory Visit: Payer: Self-pay | Admitting: Physician Assistant

## 2021-10-08 ENCOUNTER — Encounter (HOSPITAL_BASED_OUTPATIENT_CLINIC_OR_DEPARTMENT_OTHER): Payer: Self-pay | Admitting: Otolaryngology

## 2021-10-10 ENCOUNTER — Telehealth: Payer: Self-pay

## 2021-10-10 NOTE — Progress Notes (Signed)
    Chronic Care Management Pharmacy Assistant   Name: ROMESHA SCHERER  MRN: [id number] DOB: Feb 22, 1956  Reason for Encounter: CCM (Appointment Reminder)  Recent office visits:  None since last CCM contact  Recent consult visits:  08/20/21 Alona Bene, MD (Urology) Chronic Fatigue Syndrome Start: Hydroxyzine 50 mg Start: Cymbalta 60 mg Start: Fentanyl 50 mcg FU 3 months 07/03/21 Bo Merino, MD (Rheumatology) Fibromyalgia FU 6 months 06/14/21 [name], MD (Otolaryngology): Referral to ENT 05/14/21 [name], NP (Urology) Chronic, continuous use of opioids  05/09/21 Nehemiah Settle (Sleep Medicine) Obstructive sleep apnea   Hospital visits:  None in previous 6 months  Medications: Outpatient Encounter Medications as of 10/10/2021  Medication Sig   atenolol (TENORMIN) 100 MG tablet TAKE 1 TABLET BY MOUTH EVERY DAY   baclofen (LIORESAL) 20 MG tablet Take 20 mg by mouth daily as needed for muscle spasms.   botulinum toxin Type A (BOTOX) 100 units SOLR injection Inject 200 Units into the muscle every 3 (three) months.   calcium carbonate (TUMS - DOSED IN MG ELEMENTAL CALCIUM) 500 MG chewable tablet Chew 1 tablet by mouth daily as needed for indigestion or heartburn.   cholecalciferol (VITAMIN D) 1000 UNITS tablet Take 5,000 Units by mouth daily.    diclofenac sodium (VOLTAREN) 1 % GEL Apply 2 to 4 gram to affected joints up to 4 times daily   DULoxetine (CYMBALTA) 60 MG capsule TAKE 1 CAPSULE BY MOUTH EVERY DAY   ezetimibe (ZETIA) 10 MG tablet Take 1 tablet (10 mg total) by mouth daily.   famotidine (PEPCID) 20 MG tablet Take 20 mg by mouth 2 (two) times daily as needed for heartburn or indigestion.   fentaNYL (DURAGESIC - DOSED MCG/HR) 50 MCG/HR Place 50 mcg onto the skin every other day.   hydrOXYzine (ATARAX/VISTARIL) 50 MG tablet Take 100 mg by mouth at bedtime.   lidocaine (XYLOCAINE) 5 % ointment APPLY TO AFFECTED AREA EVERY DAY AS DIRECTED   LUTEIN PO Take 1 tablet  by mouth daily.   MAGNESIUM PO Take by mouth daily.   polyethylene glycol powder (GLYCOLAX/MIRALAX) 17 GM/SCOOP powder Take by mouth.   No facility-administered encounter medications on file as of 10/10/2021.   DHALIA ZINGARO was contacted to remind of upcoming telephone visit with [name]  on 10/15/21 at 11:00. Patient was reminded to have any blood glucose and blood pressure readings available for review at appointment.   Patient confirmed appointment.  Are you having any problems with your medications? No   Do you have any concerns you like to discuss with the pharmacist? No  CCM referral has been placed prior to visit?  No   Star Rating Drugs: Medication:  Last Fill: Day Supply No star rating drugs noted  [name], CPP notified  [name], [facility] Assistant [phone]

## 2021-10-15 ENCOUNTER — Ambulatory Visit: Payer: Medicare Other | Admitting: Pharmacist

## 2021-10-15 DIAGNOSIS — N3010 Interstitial cystitis (chronic) without hematuria: Secondary | ICD-10-CM

## 2021-10-15 DIAGNOSIS — M797 Fibromyalgia: Secondary | ICD-10-CM

## 2021-10-15 DIAGNOSIS — M8589 Other specified disorders of bone density and structure, multiple sites: Secondary | ICD-10-CM

## 2021-10-15 DIAGNOSIS — E7800 Pure hypercholesterolemia, unspecified: Secondary | ICD-10-CM

## 2021-10-15 DIAGNOSIS — I10 Essential (primary) hypertension: Secondary | ICD-10-CM

## 2021-10-15 DIAGNOSIS — K219 Gastro-esophageal reflux disease without esophagitis: Secondary | ICD-10-CM

## 2021-10-15 NOTE — Progress Notes (Signed)
Chronic Care Management Pharmacy Note  10/24/2021 Name:  Lindsey [name] MRN:  [id number] DOB:  08-May-1955  Summary: CCM F/U visit -Reviewed history of severe sleep apnea - she is working on acquiring Inspire device -Pt reports worsening pain lately- in bed all day due to pain - fibromyalgia, IC, migraines, she sees multiple specialists for this -Pt reports some constipation issues; she is on chronic opioids and takes docusate, senna, Miralax regularly and Magnesium citrate on occasion  Recommendations/Changes made from today's visit: -Advised to follow up with rheumatology for help with pain -Advised increasing hydration and fiber to improve constipation  Plan: -[place] will call patient 2 months for general update -Pharmacist follow up televisit scheduled for 4 months -AWV 03/2022    Subjective: Lindsey [name] is an 66 y.o. year old female who is a primary patient of Tower, Wynelle Fanny, MD.  The CCM team was consulted for assistance with disease management and care coordination needs.    Engaged with patient by telephone for follow up visit in response to provider referral for pharmacy case management and/or care coordination services.   Consent to Services:  The patient was given information about Chronic Care Management services, agreed to services, and gave verbal consent prior to initiation of services.  Please see initial visit note for detailed documentation.   Patient Care Team: Tower, Wynelle Fanny, MD as PCP - General Lenord Fellers Cleaster Corin, [facility] as Pharmacist (Pharmacist)  Recent office visits: 11/06/20 Dr Glori Bickers OV: f/u - start ezetimibe 10 mg daily - recheck lipids 6 wks. Ordered DEXA. Referred to GI (colonoscopy)  Recent consult visits: 08/20/21 Alona Bene, MD (Urology) Chronic Fatigue Syndrome Start: Hydroxyzine 50 mg Start: Cymbalta 60 mg Start: Fentanyl 50 mcg FU 3 months 08/09/21 Dr Redmond [facility] ENT): sleep apnea - arrange drug-induced sleep endoscopy 07/03/21 Bo Merino, MD (Rheumatology) Fibromyalgia FU 6 months 06/14/21 [name], MD (Otolaryngology): Referral to ENT 05/14/21 [name], NP (Urology) Chronic, continuous use of opioids  05/09/21 Nehemiah Settle (Sleep Medicine) Obstructive sleep apnea.   Hospital visits: None in previous 6 months   Objective:  Lab Results  Component Value Date   CREATININE 0.84 10/17/2021   BUN 9 10/17/2021   GFR 64.04 11/24/2019   GFRNONAA >60 10/17/2021   GFRAA 98 04/23/2007   NA 137 10/17/2021   K 4.3 10/17/2021   CALCIUM 10.0 10/17/2021   CO2 26 10/17/2021   GLUCOSE 133 (H) 10/17/2021    Lab Results  Component Value Date/Time   HGBA1C 6.1 05/30/2020 11:21 AM   HGBA1C 6.3 11/24/2019 12:05 PM   GFR 64.04 11/24/2019 12:05 PM   GFR 84.84 08/10/2018 12:34 PM    Last diabetic Eye exam: No results found for: "HMDIABEYEEXA"  Last diabetic Foot exam: No results found for: "HMDIABFOOTEX"   Lab Results  Component Value Date   CHOL 194 12/19/2020   HDL 51.50 12/19/2020   LDLCALC 117 (H) 12/19/2020   LDLDIRECT 137.4 11/27/2012   TRIG 129.0 12/19/2020   CHOLHDL 4 12/19/2020       Latest Ref Rng & Units 12/19/2020   11:17 AM 11/24/2019   12:05 PM 08/10/2018   12:34 PM  Hepatic Function  Total Protein 6.0 - 8.3 g/dL  6.8  7.0   Albumin 3.5 - 5.2 g/dL  3.9  4.0   AST 0 - 37 U/L 14  17  17    ALT 0 - 35 U/L 17  24  21    Alk Phosphatase 39 -  117 U/L  80  74   Total Bilirubin 0.2 - 1.2 mg/dL  0.5  0.5     Lab Results  Component Value Date/Time   TSH 6.98 (H) 05/30/2020 11:21 AM   TSH 6.96 (H) 11/24/2019 12:05 PM   FREET4 0.64 05/30/2020 11:21 AM       Latest Ref Rng & Units 10/17/2021    9:28 AM 11/24/2019   12:05 PM 08/10/2018   12:34 PM  CBC  WBC 4.0 - 10.5 K/uL 7.9  6.1  6.1   Hemoglobin 12.0 - 15.0 g/dL 15.3  14.2  14.4   Hematocrit 36.0 - 46.0 % 44.8  42.6  42.8   Platelets 150 - 400 K/uL 251  206.0  176.0     Lab Results  Component Value Date/Time   VD25OH 40.63  11/24/2019 12:05 PM   VD25OH 46.86 08/10/2018 12:34 PM    Clinical ASCVD: No  The 10-year ASCVD risk score (Arnett DK, et al., 2019) is: 8.5%   Values used to calculate the score:     Age: 49 years     Sex: Female     Is Non-Hispanic African American: No     Diabetic: No     Tobacco smoker: No     Systolic Blood Pressure: 124 mmHg     Is BP treated: Yes     HDL Cholesterol: 51.5 mg/dL     Total Cholesterol: 194 mg/dL       02/28/2021   12:06 PM 11/30/2019    2:44 PM 08/25/2019    1:13 PM  Depression screen PHQ 2/9  Decreased Interest 0 3 0  Down, Depressed, Hopeless 0 1 0  PHQ - 2 Score 0 4 0  Altered sleeping  3 0  Tired, decreased energy  3 0  Change in appetite  3 0  Feeling bad or failure about yourself   3 0  Trouble concentrating  3 0  Moving slowly or fidgety/restless  1 0  Suicidal thoughts  0 0  PHQ-9 Score  20 0  Difficult doing work/chores  Extremely dIfficult Not difficult at all     Social History   Tobacco Use  Smoking Status Never   Passive exposure: Past  Smokeless Tobacco Never   BP Readings from Last 3 Encounters:  10/17/21 136/69  07/03/21 (!) 146/89  01/02/21 (!) 147/87   Pulse Readings from Last 3 Encounters:  10/17/21 61  07/03/21 61  01/02/21 63   Wt Readings from Last 3 Encounters:  10/17/21 185 lb (83.9 kg)  07/03/21 188 lb 12.8 oz (85.6 kg)  02/28/21 189 lb (85.7 kg)   BMI Readings from Last 3 Encounters:  10/17/21 31.76 kg/m  07/03/21 32.41 kg/m  02/28/21 32.44 kg/m    Assessment/Interventions: Review of patient past medical history, allergies, medications, health status, including review of consultants reports, laboratory and other test data, was performed as part of comprehensive evaluation and provision of chronic care management services.   SDOH:  (Social Determinants of Health) assessments and interventions performed: No - done Nov 2022  SDOH Screenings   Alcohol Screen: Low Risk  (02/28/2021)   Alcohol Screen     Last Alcohol Screening Score (AUDIT): 0  Depression (PHQ2-9): Low Risk  (02/28/2021)   Depression (PHQ2-9)    PHQ-2 Score: 0  Financial Resource Strain: Low Risk  (02/28/2021)   Overall Financial Resource Strain (CARDIA)    Difficulty of Paying Living Expenses: Not hard at all  Food Insecurity: No Food  Insecurity (02/28/2021)   Hunger Vital Sign    Worried About Running Out of Food in the Last Year: Never true    [place] in the Last Year: Never true  Housing: Low Risk  (02/28/2021)   Housing    Last Housing Risk Score: 0  Physical Activity: Inactive (02/28/2021)   Exercise Vital Sign    Days of Exercise per Week: 0 days    Minutes of Exercise per Session: 0 min  Social Connections: Socially Isolated (02/28/2021)   Social Connection and Isolation Panel [NHANES]    Frequency of Communication with Friends and Family: Never    Frequency of Social Gatherings with Friends and Family: Never    Attends Religious Services: Never    Marine scientist or Organizations: No    Attends Archivist Meetings: Never    Marital Status: Married  Stress: No Stress Concern Present (02/28/2021)   [employer]    Feeling of Stress : Only a little  Tobacco Use: Low Risk  (10/18/2021)   Patient History    Smoking Tobacco Use: Never    Smokeless Tobacco Use: Never    Passive Exposure: Past  Transportation Needs: No Transportation Needs (02/28/2021)   PRAPARE - Transportation    Lack of Transportation (Medical): No    Lack of Transportation (Non-Medical): No    CCM Care Plan  Allergies  Allergen Reactions   Buspirone Hcl     REACTION: dystonia   Crestor [Rosuvastatin]     Muscle pain     Simvastatin     REACTION: malaise   Sulfa Antibiotics     nausea    Medications Reviewed Today     Reviewed by Charlton Haws, [facility] (Pharmacist) on 10/24/21 at 1608  Med List Status: <None>   Medication Order  Taking? Sig Documenting Provider Last Dose Status Informant  atenolol (TENORMIN) 100 MG tablet [id number] Yes TAKE 1 TABLET BY MOUTH EVERY DAY Tower, Wynelle Fanny, MD Taking Active Self  baclofen (LIORESAL) 20 MG tablet [id number] Yes Take 20 mg by mouth daily as needed for muscle spasms. [provider] Taking Active Self  botulinum toxin Type A (BOTOX) 100 units SOLR injection [id number] Yes Inject 200 Units into the muscle every 3 (three) months. [provider] Taking Active Self  calcium carbonate (TUMS - DOSED IN MG ELEMENTAL CALCIUM) 500 MG chewable tablet [id number] Yes Chew 1-2 tablets by mouth 2 (two) times daily as needed for indigestion or heartburn. [provider] Taking Active Self  Cholecalciferol (VITAMIN D-3) 125 MCG (5000 UT) TABS [id number] Yes Take 5,000 Units by mouth every evening. [provider] Taking Active Self  diclofenac sodium (VOLTAREN) 1 % GEL [id number] Yes Apply 2 to 4 gram to affected joints up to 4 times daily Bo Merino, MD Taking Active Self  docusate sodium (COLACE) 100 MG capsule [id number] Yes Take 200 mg by mouth at bedtime. [provider] Taking Active Self  DULoxetine (CYMBALTA) 60 MG capsule [id number] Yes TAKE 1 CAPSULE BY MOUTH EVERY DAY Deveshwar, Shaili, MD Taking Active Self  ezetimibe (ZETIA) 10 MG tablet [id number] Yes Take 1 tablet (10 mg total) by mouth daily. Tower, Wynelle Fanny, MD Taking Active Self  famotidine (PEPCID) 20 MG tablet [id number] Yes Take 20 mg by mouth daily as needed for heartburn or indigestion. [provider] Taking Active Self  fentaNYL (DURAGESIC - DOSED MCG/HR) 50 MCG/HR [id number] Yes Place 50 mcg onto the  skin every other day. [provider] Taking Active Self  hydrOXYzine (ATARAX/VISTARIL) 50 MG tablet [id number] Yes Take 50 mg by mouth at bedtime. [provider] Taking Active Self  ibuprofen (ADVIL) 200 MG tablet [id number] Yes Take 400 mg by mouth every 8 (eight)  hours as needed (pain.). [provider] Taking Active Self  lidocaine (XYLOCAINE) 5 % ointment [id number] Yes Apply 1 Application topically daily as needed (pain.). [provider] Taking Active Self  LUTEIN PO [id number] Yes Take 40 mg by mouth at bedtime. [provider] Taking Active Self  magnesium oxide (MAG-OX) 400 (240 Mg) MG tablet [id number] Yes Take 400 mg by mouth at bedtime. [provider] Taking Active Self  Polyethyl Glycol-Propyl Glycol (LUBRICANT EYE DROPS) 0.4-0.3 % SOLN [id number] Yes Place 1-2 drops into both eyes 3 (three) times daily as needed (dry/irritated eyes). [provider] Taking Active Self  polyethylene glycol powder (GLYCOLAX/MIRALAX) 17 GM/SCOOP powder [id number] Yes Take 17 g by mouth daily as needed (constipation.). [provider] Taking Active Self            Patient Active Problem List   Diagnosis Date Noted   Colon cancer screening 11/06/2020   Elevated TSH 05/29/2020   Prediabetes 08/09/2018   Estrogen deficiency 01/28/2018   Autoimmune disease ([place]) 06/18/2017   History of recurrent cystitis (IC) 08/16/2016   Pelvic floor dysfunction 08/16/2016   Dysuria 05/24/2016   Macular degeneration 04/09/2016   Osteoarthritis, knee 02/15/2016   S/P laparoscopic cholecystectomy June 2017 09/26/2015   Dermatitis 10/31/2014   Encounter for Medicare annual wellness exam 09/04/2012   HSV (herpes simplex virus) infection 04/08/2012   IBS (irritable bowel syndrome) 10/21/2011   Routine general medical examination at a health care facility 07/02/2011   Osteopenia 05/17/2009   Depressed mood 10/05/2008   GERD 05/05/2008   OSA (obstructive sleep apnea) 06/25/2007   Hyperlipidemia 05/31/2007   Essential hypertension 05/31/2007   Other fatigue 04/23/2007   Vitamin D deficiency 01/22/2007   Migraine headache 01/22/2007   Interstitial cystitis 01/22/2007   MENOPAUSE-RELATED VASOMOTOR SYMPTOMS 01/22/2007    OSTEOARTHROSIS, GENERALIZED, MULTIPLE SITES 01/22/2007   Fibromyalgia 01/22/2007   PATENT FORAMEN OVALE 01/22/2007    Immunization History  Administered Date(s) Administered   Influenza Inj Mdck Quad Pf 12/16/2017   Influenza Whole 12/31/2006   Influenza,inj,Quad PF,6+ Mos 12/24/2013, 02/20/2016, 10/30/2016, 11/30/2016, 11/26/2018   PFIZER(Purple Top)SARS-COV-2 Vaccination 06/18/2019, 07/13/2019, 03/06/2020   Pneumococcal Polysaccharide-23 09/04/2012   Td 04/23/2007   Tdap 06/17/2017    Conditions to be addressed/monitored:  Hypertension, Hyperlipidemia, GERD, and Osteopenia, Fibromyalgia, Constipation  Care Plan : [place]  Updates made by Charlton Haws, [place] since 10/24/2021 12:00 AM     Problem: Hypertension, Hyperlipidemia, GERD, and Osteopenia, Fibromyalgia, Constipation      Long-Range Goal: Disease Management   Start Date: 10/26/2020  Expected End Date: 10/25/2022  This Visit's Progress: On track  Priority: High  Note:   Current Barriers:  Unable to maintain control of pain  Pharmacist Clinical Goal(s):  Patient will adhere to plan to optimize therapeutic regimen for pain as evidenced by report of adherence to recommended medication management changes through collaboration with [name] and provider.   Interventions: 1:1 collaboration with Tower, Wynelle Fanny, MD regarding development and update of comprehensive plan of care as evidenced by provider attestation and co-signature Inter-disciplinary care team collaboration (see longitudinal plan of care) Comprehensive medication review performed; medication list updated in electronic medical record  Hypertension (BP goal <140/90) -  Controlled - per clinic readings  -Severe sleep apnea - has been working toward [employer] device, procedure pending this week to begin proccess -Current treatment: Atenolol 100 mg daily - Appropriate, Effective, Safe, Accessible -Medications previously tried: none reported   -Current exercise habits: none -Atenolol considerations: may help with migraine prevention -Educated on BP goals and benefits of medications for prevention of heart attack, stroke and kidney damage; -Recommended to continue current medication  Hyperlipidemia: (LDL goal < 100) -Not ideally controlled - LDL 117 (11/2020) improved from 150 on ezetimibe (22% reduction); pt has not tolerated statins -Considerations: Last TSH mildly elevated (TSH < 6.98) which may contribute to elevations in cholesterol -Current treatment: Ezetimibe 10 mg daily - Appropriate, Effective, Safe, Accessible -Medications previously tried: simvastatin, rosuvastatin (2x weekly) -Educated on Cholesterol goals; Benefits of statin for ASCVD risk reduction; limiting foods high in cholesterol -Recommend to continue current medication  Fibromyalgia  (Goal: Symptom control) -Not ideally controlled - per patient report, she struggles to get out of bed due to pain -Managed by rheumatology -Current treatment: Duloxetine 60 mg daily - Appropriate, Query Effective Voltaren gel PRN -Appropriate, Query Effective Lidocaine 5% ointment -Appropriate, Query Effective -Medications previously tried/failed: none -Recommended to continue current medication; advised to follow up with rheumatology  Migraines (Goal: Reduce migraine frequency/severity) -Controlled- per patient botox works very well -Managed by neurology -Current treatment  Botox Injections q3 months (last 10/11/21) - Appropriate, Effective, Safe, Accessible Atenolol 100 mg daily HS -Appropriate, Effective, Safe, Accessible Baclofen 20 mg PRN (for bladder or migraine) -Appropriate, Effective, Safe, Accessible Ibuprofen 200 mg - 2 tab q8h -Appropriate, Effective, Safe, Accessible -Medications previously tried: none reported  -Recommended to continue current medication  Interstitial Cystitis -Managed by urology - pt is satisfied with current therapy -Current  treatment: Hydroxyzine 50 mg - 2 tab HS - Appropriate, Effective, Safe, Accessible Fentanyl 50 mcg/hour - 1 patch QOD -Appropriate, Effective, Safe, Accessible Baclofen 20 mg PRN -Appropriate, Effective, Safe, Accessible -Recommended to continue current medication  GERD -Controlled - per pt report. She was referred to GI last year (10/2020) but never scheduled appt -Current treatment  Famotidine 20 mg BID - Appropriate, Effective, Safe, Accessible Tums PRN -Appropriate, Effective, Safe, Accessible -Recommended to continue current medication -Recommend to call GI to scheduled colonoscopy  Osteopenia (Goal prevent fractures) -Controlled - DEXA scan pending for Aug 2023 -Last DEXA Scan: 05/2018   T-Score femoral neck: -1.8  T-Score total hip: -1.3  T-Score lumbar spine: -0.7  10-year probability of major osteoporotic fracture: 8.8%  10-year probability of hip fracture: 1.0% -Patient is not a candidate for pharmacologic treatment -Current treatment  Vitamin D 5000 IU daily - Appropriate, Effective, Safe, Accessible -Medications previously tried: n/a  -Recommend [phone] units of vitamin D daily. -Recommended to continue current medication; keep DEXA scan appt  Constipation (Goal: improve BM) -Not ideally controlled -Current treatment  Docusate 100 mg - 2 tab HS Miralax PRN Senna PRN Mag citrate PRN -Medications previously tried: n/a -Reviewed importance of hydration and fiber for regular bowel movements; also discussed impact of chronic opioids  -Recommended to continue current medication  Health Maintenance -Vaccine gaps: Shingrix -Current therapy:  Lutein 40 mg Magnesium oxide 400 mg   Patient Goals/Self-Care Activities Patient will:  - take medications as prescribed as evidenced by patient report and record review focus on medication adherence by routine -follow up with rheumatology       Medication Assistance: None required.  Patient affirms current coverage  meets needs.  Compliance/Adherence/Medication fill history:  Care Gaps: Colonoscopy (due 08/08/20)  Star-Rating Drugs: None  Medication Access: Within the past 30 days, how often has patient missed a dose of medication? 0 Is a pillbox or other method used to improve adherence? Yes  Factors that may affect medication adherence? no barriers identified Are meds synced by current pharmacy? No  Are meds delivered by current pharmacy? No  Does patient experience delays in picking up medications due to transportation concerns? No   [facility] Services Reviewed: Is patient disadvantaged to use [facility]?: No  Current Rx insurance plan: [facility] Name and location of Current pharmacy:  [id number]Phone: [phone]: [phone] [facility] services reviewed with patient today?: No  Patient requests to transfer care to [facility]?: No  Reason patient declined to change pharmacies: Not mentioned at this visit   Care Plan and Follow Up Patient Decision:  Patient agrees to Care Plan and Follow-up.  Plan: Telephone follow up appointment with care management team member scheduled for:  4 months  LCharlene Brooke [name], BCACP Clinical Pharmacist [phone]

## 2021-10-16 ENCOUNTER — Encounter (HOSPITAL_COMMUNITY): Payer: Self-pay | Admitting: Otolaryngology

## 2021-10-16 ENCOUNTER — Other Ambulatory Visit: Payer: Self-pay

## 2021-10-16 NOTE — Progress Notes (Signed)
Mrs. Lindsey [name] denies chest pain or shortness of breath. Patient denies having any s/s of Covid in her household.  Patient denies any known exposure to Covid.   PCP is Dr.Marne [facility].  Mrs Lindsey [name] wears a fentanyl patch, it is due to be changed tomorrow, I instructed patient to not put a new one on in am,  it will be removed before surgery.

## 2021-10-17 ENCOUNTER — Encounter (HOSPITAL_COMMUNITY)
Admission: RE | Disposition: A | Discharge status: Home / Self Care | Payer: Self-pay | Source: Home / Self Care | Attending: Otolaryngology

## 2021-10-17 ENCOUNTER — Encounter (HOSPITAL_COMMUNITY): Payer: Self-pay | Admitting: Otolaryngology

## 2021-10-17 ENCOUNTER — Ambulatory Visit (HOSPITAL_BASED_OUTPATIENT_CLINIC_OR_DEPARTMENT_OTHER): Payer: Medicare Other | Admitting: Certified Registered Nurse Anesthetist

## 2021-10-17 ENCOUNTER — Ambulatory Visit (HOSPITAL_COMMUNITY)
Admission: RE | Admit: 2021-10-17 | Discharge: 2021-10-17 | Disposition: A | Discharge status: Home / Self Care | Payer: Medicare Other | Attending: Otolaryngology | Admitting: Otolaryngology

## 2021-10-17 ENCOUNTER — Other Ambulatory Visit: Payer: Self-pay

## 2021-10-17 ENCOUNTER — Ambulatory Visit (HOSPITAL_COMMUNITY): Payer: Medicare Other | Admitting: Certified Registered Nurse Anesthetist

## 2021-10-17 DIAGNOSIS — Z01818 Encounter for other preprocedural examination: Secondary | ICD-10-CM

## 2021-10-17 DIAGNOSIS — I10 Essential (primary) hypertension: Secondary | ICD-10-CM

## 2021-10-17 DIAGNOSIS — M797 Fibromyalgia: Secondary | ICD-10-CM

## 2021-10-17 DIAGNOSIS — K219 Gastro-esophageal reflux disease without esophagitis: Secondary | ICD-10-CM | POA: Insufficient documentation

## 2021-10-17 DIAGNOSIS — G4733 Obstructive sleep apnea (adult) (pediatric): Secondary | ICD-10-CM

## 2021-10-17 DIAGNOSIS — Z7722 Contact with and (suspected) exposure to environmental tobacco smoke (acute) (chronic): Secondary | ICD-10-CM | POA: Insufficient documentation

## 2021-10-17 DIAGNOSIS — M1990 Unspecified osteoarthritis, unspecified site: Secondary | ICD-10-CM | POA: Diagnosis not present

## 2021-10-17 HISTORY — DX: Gastro-esophageal reflux disease without esophagitis: K21.9

## 2021-10-17 HISTORY — DX: Cardiac murmur, unspecified: R01.1

## 2021-10-17 HISTORY — PX: DRUG INDUCED ENDOSCOPY: SHX6808

## 2021-10-17 HISTORY — DX: Prediabetes: R73.03

## 2021-10-17 HISTORY — DX: Pneumonia, unspecified organism: J18.9

## 2021-10-17 LAB — BASIC METABOLIC PANEL
Anion gap: 11 (ref 5–15)
BUN: 9 mg/dL (ref 8–23)
CO2: 26 mmol/L (ref 22–32)
Calcium: 10 mg/dL (ref 8.9–10.3)
Chloride: 100 mmol/L (ref 98–111)
Creatinine, Ser: 0.84 mg/dL (ref 0.44–1.00)
GFR, Estimated: >60 mL/min (ref >60)
Glucose, Bld: 133 mg/dL — ABNORMAL HIGH (ref 70–99)
Potassium: 4.3 mmol/L (ref 3.5–5.1)
Sodium: 137 mmol/L (ref 135–145)

## 2021-10-17 LAB — CBC
HCT: 44.8 % (ref 36.0–46.0)
Hemoglobin: 15.3 g/dL — ABNORMAL HIGH (ref 12.0–15.0)
MCH: 29.8 pg (ref 26.0–34.0)
MCHC: 34.2 g/dL (ref 30.0–36.0)
MCV: 87.2 fL (ref 80.0–100.0)
Platelets: 251 10*3/uL (ref 150–400)
RBC: 5.14 MIL/uL — ABNORMAL HIGH (ref 3.87–5.11)
RDW: 13.5 % (ref 11.5–15.5)
WBC: 7.9 10*3/uL (ref 4.0–10.5)
nRBC: 0 % (ref 0.0–0.2)

## 2021-10-17 SURGERY — DRUG INDUCED SLEEP ENDOSCOPY
Anesthesia: General | Site: Nose | Laterality: Bilateral

## 2021-10-17 MED ORDER — CHLORHEXIDINE GLUCONATE 0.12 % MT SOLN
15.0000 mL | Freq: Once | OROMUCOSAL | Status: AC
  Administered 2021-10-17: 15 mL via OROMUCOSAL
  Filled 2021-10-17: qty 15

## 2021-10-17 MED ORDER — OXYMETAZOLINE HCL 0.05 % NA SOLN
NASAL | Status: DC | PRN
  Administered 2021-10-17: 1

## 2021-10-17 MED ORDER — PROPOFOL 500 MG/50ML IV EMUL
INTRAVENOUS | Status: DC | PRN
  Administered 2021-10-17: 75 ug/kg/min via INTRAVENOUS

## 2021-10-17 MED ORDER — LACTATED RINGERS IV SOLN: INTRAVENOUS | Status: DC

## 2021-10-17 MED ORDER — ORAL CARE MOUTH RINSE: 15.0000 mL | Freq: Once | OROMUCOSAL | Status: AC

## 2021-10-17 MED ORDER — OXYMETAZOLINE HCL 0.05 % NA SOLN
NASAL | Status: AC
  Filled 2021-10-17: qty 30

## 2021-10-17 SURGICAL SUPPLY — 14 items
BAG COUNTER SPONGE SURGICOUNT (BAG) IMPLANT
BAG SPNG CNTER NS LX DISP (BAG)
CANISTER SUCT 1200ML W/VALVE (MISCELLANEOUS) ×3 IMPLANT
GLOVE BIO SURGEON STRL SZ7.5 (GLOVE) ×3 IMPLANT
KIT BASIN OR (CUSTOM PROCEDURE TRAY) ×3 IMPLANT
NDL PRECISIONGLIDE 27X1.5 (NEEDLE) IMPLANT
NEEDLE PRECISIONGLIDE 27X1.5 (NEEDLE) IMPLANT
PATTIES SURGICAL .5 X3 (DISPOSABLE) ×3 IMPLANT
SHEET MEDIUM DRAPE 40X70 STRL (DRAPES) IMPLANT
SOL ANTI FOG 6CC (MISCELLANEOUS) ×2 IMPLANT
SOLUTION ANTI FOG 6CC (MISCELLANEOUS) ×1
SYR CONTROL 10ML LL (SYRINGE) IMPLANT
TOWEL GREEN STERILE FF (TOWEL DISPOSABLE) ×3 IMPLANT
TUBE CONNECTING 20X1/4 (TUBING) ×3 IMPLANT

## 2021-10-17 NOTE — Anesthesia Procedure Notes (Signed)
Procedure Name: MAC Date/Time: 10/17/2021 11:36 AM  Performed by: Merryl Hacker, RNPre-anesthesia Checklist: Patient identified, Emergency Drugs available, Suction available and Patient being monitored Patient Re-evaluated:Patient Re-evaluated prior to induction

## 2021-10-17 NOTE — Anesthesia Postprocedure Evaluation (Signed)
Anesthesia Post Note  Patient: Lindsey [name]  Procedure(s) Performed: DRUG INDUCED ENDOSCOPY (Bilateral: Nose)     Patient location during evaluation: PACU Anesthesia Type: General Level of consciousness: awake and alert Pain management: pain level controlled Vital Signs Assessment: post-procedure vital signs reviewed and stable Respiratory status: spontaneous breathing, nonlabored ventilation, respiratory function stable and patient connected to nasal cannula oxygen Cardiovascular status: blood pressure returned to baseline and stable Postop Assessment: no apparent nausea or vomiting Anesthetic complications: no   No notable events documented.  Last Vitals:  Vitals:   10/17/21 1155 10/17/21 1210  BP: 114/72 136/69  Pulse: 63 61  Resp: 12 13  Temp: 36.5 C 36.5 C  SpO2: 95% 96%    Last Pain:  Vitals:   10/17/21 1210  TempSrc:   PainSc: 0-No pain                 Har[place] Bula L Keysean Savino

## 2021-10-17 NOTE — Anesthesia Preprocedure Evaluation (Addendum)
Anesthesia Evaluation  Patient identified by MRN, date of birth, ID band Patient awake    Reviewed: Allergy & Precautions, NPO status , Patient's Chart, lab work & pertinent test results  Airway Mallampati: III  TM Distance: >3 FB Neck ROM: Full  Mouth opening: Limited Mouth Opening  Dental no notable dental hx. (+) Teeth Intact, Dental Advisory Given   Pulmonary sleep apnea ,    Pulmonary exam normal breath sounds clear to auscultation       Cardiovascular hypertension, Pt. on medications and Pt. on home beta blockers negative cardio ROS Normal cardiovascular exam Rhythm:Regular Rate:Normal     Neuro/Psych  Headaches, negative psych ROS   GI/Hepatic Neg liver ROS, GERD  ,  Endo/Other  negative endocrine ROS  Renal/GU negative Renal ROS  negative genitourinary   Musculoskeletal  (+) Arthritis , Fibromyalgia -  Abdominal   Peds  Hematology negative hematology ROS (+)   Anesthesia Other Findings   Reproductive/Obstetrics                            Anesthesia Physical Anesthesia Plan  ASA: 3  Anesthesia Plan: General   Post-op Pain Management:    Induction: Intravenous  PONV Risk Score and Plan: 3 and Propofol infusion and Treatment may vary due to age or medical condition  Airway Management Planned: Natural Airway  Additional Equipment:   Intra-op Plan:   Post-operative Plan:   Informed Consent: I have reviewed the patients History and Physical, chart, labs and discussed the procedure including the risks, benefits and alternatives for the proposed anesthesia with the patient or authorized representative who has indicated his/her understanding and acceptance.     Dental advisory given  Plan Discussed with: CRNA  Anesthesia Plan Comments:         Anesthesia Quick Evaluation

## 2021-10-17 NOTE — Brief Op Note (Signed)
10/17/2021  11:58 AM  PATIENT:  Lindsey [name]  66 y.o. female  PRE-OPERATIVE DIAGNOSIS:  Obstructive sleep apnea BMI 32.0-32.9,adult  POST-OPERATIVE DIAGNOSIS:  Obstructive sleep apnea BMI 32.0-32.9,adult  PROCEDURE:  Procedure(s): DRUG INDUCED ENDOSCOPY (Bilateral)  SURGEON:  Surgeon(s) and Role:    [name], MD - Primary  PHYSICIAN ASSISTANT:   ASSISTANTS: none   ANESTHESIA:   IV sedation  EBL:  1 mL   BLOOD ADMINISTERED:none  DRAINS: none   LOCAL MEDICATIONS USED:  NONE  SPECIMEN:  No Specimen  DISPOSITION OF SPECIMEN:  N/A  COUNTS:  YES  TOURNIQUET:  * No tourniquets in log *  DICTATION: .Note written in EPIC  PLAN OF CARE: Discharge to home after PACU  PATIENT DISPOSITION:  PACU - hemodynamically stable.   Delay start of Pharmacological VTE agent (>24hrs) due to surgical blood loss or risk of bleeding: no

## 2021-10-17 NOTE — H&P (Signed)
Lindsey [name] is an 66 y.o. female.   Chief Complaint: Sleep apnea HPI: 66 year old female with obstructive sleep apnea who has been unable to tolerate CPAP.  Past Medical History:  Diagnosis Date   Arthritis    Cataract    bil - not ready for surgery   Chronic fatigue    Early onset macular degeneration    Bilateral   Fibromyalgia    GERD (gastroesophageal reflux disease)    Heart murmur    Hemorrhoids    Hyperglycemia 12/24/2013   Hyperlipidemia    Hypersomnia    Hypertension    IBS (irritable bowel syndrome)    Interstitial cystitis    Migraines    OSA (obstructive sleep apnea)    Osteopenia    Pelvic floor dysfunction    PFO (patent foramen ovale)    Pneumonia    Pre-diabetes    Tendonitis    hand   Vitamin D deficiency     Past Surgical History:  Procedure Laterality Date   ANKLE ARTHROSCOPY Right    ANKLE FRACTURE SURGERY Left    BLADDER SURGERY     and hydrodistension   BREAST EXCISIONAL BIOPSY Bilateral    Multiple Benign Lumps removed     BREAST LUMPECTOMY     many, bilateral   CERVICAL DISCECTOMY  2006   fusion and plating   CHOLECYSTECTOMY N/A 09/26/2015   Procedure: LAPAROSCOPIC CHOLECYSTECTOMY WITH INTRAOPERATIVE CHOLANGIOGRAM;  Surgeon: Johnathan Hausen, MD;  Location: [facility] ORS;  Service: General;  Laterality: N/A;   COLONOSCOPY     FRACTURE SURGERY     ankle fracture x 2   NASAL SINUS SURGERY     multiple, x4   UPPER GASTROINTESTINAL ENDOSCOPY     VAGINAL HYSTERECTOMY  1990   because of the IC partial    Family History  Problem Relation Age of Onset   Diabetes Father    Alcohol abuse Father    Heart disease Father    Fibromyalgia Mother    Colon cancer Paternal Uncle    Stomach cancer Paternal Grandfather    Diabetes Paternal Grandfather    Diabetes Paternal Grandmother    Stroke Paternal Grandmother    Breast cancer Neg Hx    Social History:  reports that she has never smoked. She has been exposed to tobacco smoke. She has never used  smokeless tobacco. She reports that she does not drink alcohol and does not use drugs.  Allergies:  Allergies  Allergen Reactions   Buspirone Hcl     REACTION: dystonia   Crestor [Rosuvastatin]     Muscle pain     Simvastatin     REACTION: malaise   Sulfa Antibiotics     nausea    Medications Prior to Admission  Medication Sig Dispense Refill   atenolol (TENORMIN) 100 MG tablet TAKE 1 TABLET BY MOUTH EVERY DAY 90 tablet 1   baclofen (LIORESAL) 20 MG tablet Take 20 mg by mouth daily as needed for muscle spasms.     botulinum toxin Type A (BOTOX) 100 units SOLR injection Inject 200 Units into the muscle every 3 (three) months.     calcium carbonate (TUMS - DOSED IN MG ELEMENTAL CALCIUM) 500 MG chewable tablet Chew 1-2 tablets by mouth 2 (two) times daily as needed for indigestion or heartburn.     Cholecalciferol (VITAMIN D-3) 125 MCG (5000 UT) TABS Take 5,000 Units by mouth every evening.     diclofenac sodium (VOLTAREN) 1 % GEL Apply 2  to 4 gram to affected joints up to 4 times daily 400 g 2   docusate sodium (COLACE) 100 MG capsule Take 200 mg by mouth at bedtime.     DULoxetine (CYMBALTA) 60 MG capsule TAKE 1 CAPSULE BY MOUTH EVERY DAY 90 capsule 0   ezetimibe (ZETIA) 10 MG tablet Take 1 tablet (10 mg total) by mouth daily. 90 tablet 3   famotidine (PEPCID) 20 MG tablet Take 20 mg by mouth daily as needed for heartburn or indigestion.     fentaNYL (DURAGESIC - DOSED MCG/HR) 50 MCG/HR Place 50 mcg onto the skin every other day.     hydrOXYzine (ATARAX/VISTARIL) 50 MG tablet Take 50 mg by mouth at bedtime.     ibuprofen (ADVIL) 200 MG tablet Take 400 mg by mouth every 8 (eight) hours as needed (pain.).     LUTEIN PO Take 40 mg by mouth at bedtime.     magnesium oxide (MAG-OX) 400 (240 Mg) MG tablet Take 400 mg by mouth at bedtime.     Polyethyl Glycol-Propyl Glycol (LUBRICANT EYE DROPS) 0.4-0.3 % SOLN Place 1-2 drops into both eyes 3 (three) times daily as needed (dry/irritated  eyes).     polyethylene glycol powder (GLYCOLAX/MIRALAX) 17 GM/SCOOP powder Take 17 g by mouth daily as needed (constipation.).     lidocaine (XYLOCAINE) 5 % ointment Apply 1 Application topically daily as needed (pain.).      Results for orders placed or performed during the hospital encounter of 10/17/21 (from the past 48 hour(s))  CBC     Status: Abnormal   Collection Time: 10/17/21  9:28 AM  Result Value Ref Range   WBC 7.9 4.0 - 10.5 K/uL   RBC 5.14 (H) 3.87 - 5.11 MIL/uL   Hemoglobin 15.3 (H) 12.0 - 15.0 g/dL   HCT 44.8 36.0 - 46.0 %   MCV 87.2 80.0 - 100.0 fL   MCH 29.8 26.0 - 34.0 pg   MCHC 34.2 30.0 - 36.0 g/dL   RDW 13.5 11.5 - 15.5 %   Platelets 251 150 - 400 K/uL   nRBC 0.0 0.0 - 0.2 %    Comment: Performed at [facility], [place] [address], [place], [place] [address]   No results found.  Review of Systems  All other systems reviewed and are negative.   Blood pressure (!) 152/82, pulse 66, temperature 98.3 F (36.8 C), temperature source Oral, resp. rate 17, height '5\' 4"'$  (1.626 m), weight 83.9 kg, SpO2 99 %. Physical Exam Constitutional:      Appearance: Normal appearance. She is normal weight.  HENT:     Head: Normocephalic and atraumatic.     Right Ear: External ear normal.     Left Ear: External ear normal.     Nose: Nose normal.     Mouth/Throat:     Mouth: Mucous membranes are moist.     Pharynx: Oropharynx is clear.  Eyes:     Extraocular Movements: Extraocular movements intact.     Conjunctiva/sclera: Conjunctivae normal.     Pupils: Pupils are equal, round, and reactive to light.  Cardiovascular:     Rate and Rhythm: Normal rate.  Pulmonary:     Effort: Pulmonary effort is normal.  Musculoskeletal:     Cervical back: Normal range of motion.  Skin:    General: Skin is warm and dry.  Neurological:     General: No focal deficit present.     Mental Status: She is alert and oriented to  person, place, and time.  Psychiatric:        Mood and  Affect: Mood normal.        Behavior: Behavior normal.        Thought Content: Thought content normal.        Judgment: Judgment normal.      Assessment/Plan Obstructive sleep apnea and BMI 31.76  To OR for sleep endoscopy.  [name], MD 10/17/2021, 10:23 AM

## 2021-10-17 NOTE — Op Note (Signed)
Preop diagnosis: Obstructive sleep apnea Postop diagnosis: same Procedure: Drug-induced sleep endoscopy Surgeon: Redmond Baseman Anesth: IV sedation Compl: None Findings: There is 50% anterior-posterior and 50% lateral wall collapse at the velum making her a candidate for hypoglossal nerve stimulator placement.  Collapse was more concentric-appearing more inferiorly.  There was some lateral collapse at the level of the tonsils and anterior-posterior collapse at the epiglottis. Description:  After discussing risks, benefits, and alternatives, the patient was brought to the operative suite and placed on the operative table in the supine position.  Anesthesia was induced and the patient was given light sedation to simulate natural sleep. When the proper level was reached, an Afrin-soaked pledget was placed in the right nasal passage for a couple of minutes and then removed.  The fiberoptic laryngoscope was then passed to view the pharynx and larynx.  Findings are noted above and the exam was recorded.  After completion, the scope was removed and the patient was returned to anesthesia for wakeup and was moved to the recovery room in stable condition.

## 2021-10-17 NOTE — Transfer of Care (Signed)
Immediate Anesthesia Transfer of Care Note  Patient: Lindsey [name]  Procedure(s) Performed: DRUG INDUCED ENDOSCOPY (Bilateral: Nose)  Patient Location: PACU  Anesthesia Type:MAC  Level of Consciousness: awake, alert  and oriented  Airway & Oxygen Therapy: Patient Spontanous Breathing  Post-op Assessment: Report given to RN, Post -op Vital signs reviewed and stable and Patient moving all extremities X 4  Post vital signs: Reviewed and stable  Last Vitals:  Vitals Value Taken Time  BP 114/72 10/17/21 1156  Temp    Pulse 61 10/17/21 1157  Resp 12 10/17/21 1157  SpO2 95 % 10/17/21 1157  Vitals shown include unvalidated device data.  Last Pain:  Vitals:   10/17/21 0926  TempSrc:   PainSc: 7          Complications: No notable events documented.

## 2021-10-18 ENCOUNTER — Encounter (HOSPITAL_COMMUNITY): Payer: Self-pay | Admitting: Otolaryngology

## 2021-10-24 NOTE — Patient Instructions (Signed)
Visit Information  Phone number for Pharmacist: [phone]   Goals Addressed   None     Care Plan : [place]  Updates made by Charlton Haws, RPH since 10/24/2021 12:00 AM     Problem: Hypertension, Hyperlipidemia, GERD, and Osteopenia, Fibromyalgia, Constipation      Long-Range Goal: Disease Management   Start Date: 10/26/2020  Expected End Date: 10/25/2022  This Visit's Progress: On track  Priority: High  Note:   Current Barriers:  Unable to maintain control of pain  Pharmacist Clinical Goal(s):  Patient will adhere to plan to optimize therapeutic regimen for pain as evidenced by report of adherence to recommended medication management changes through collaboration with [name] and provider.   Interventions: 1:1 collaboration with Tower, Wynelle Fanny, MD regarding development and update of comprehensive plan of care as evidenced by provider attestation and co-signature Inter-disciplinary care team collaboration (see longitudinal plan of care) Comprehensive medication review performed; medication list updated in electronic medical record  Hypertension (BP goal <140/90) -Controlled - per clinic readings  -Severe sleep apnea - has been working toward [employer] device, procedure pending this week to begin proccess -Current treatment: Atenolol 100 mg daily - Appropriate, Effective, Safe, Accessible -Medications previously tried: none reported  -Current exercise habits: none -Atenolol considerations: may help with migraine prevention -Educated on BP goals and benefits of medications for prevention of heart attack, stroke and kidney damage; -Recommended to continue current medication  Hyperlipidemia: (LDL goal < 100) -Not ideally controlled - LDL 117 (11/2020) improved from 150 on ezetimibe (22% reduction); pt has not tolerated statins -Considerations: Last TSH mildly elevated (TSH < 6.98) which may contribute to elevations in cholesterol -Current  treatment: Ezetimibe 10 mg daily - Appropriate, Effective, Safe, Accessible -Medications previously tried: simvastatin, rosuvastatin (2x weekly) -Educated on Cholesterol goals; Benefits of statin for ASCVD risk reduction; limiting foods high in cholesterol -Recommend to continue current medication  Fibromyalgia  (Goal: Symptom control) -Not ideally controlled - per patient report, she struggles to get out of bed due to pain -Managed by rheumatology -Current treatment: Duloxetine 60 mg daily - Appropriate, Query Effective Voltaren gel PRN -Appropriate, Query Effective Lidocaine 5% ointment -Appropriate, Query Effective -Medications previously tried/failed: none -Recommended to continue current medication; advised to follow up with rheumatology  Migraines (Goal: Reduce migraine frequency/severity) -Controlled- per patient botox works very well -Managed by neurology -Current treatment  Botox Injections q3 months (last 10/11/21) - Appropriate, Effective, Safe, Accessible Atenolol 100 mg daily HS -Appropriate, Effective, Safe, Accessible Baclofen 20 mg PRN (for bladder or migraine) -Appropriate, Effective, Safe, Accessible Ibuprofen 200 mg - 2 tab q8h -Appropriate, Effective, Safe, Accessible -Medications previously tried: none reported  -Recommended to continue current medication  Interstitial Cystitis -Managed by urology - pt is satisfied with current therapy -Current treatment: Hydroxyzine 50 mg - 2 tab HS - Appropriate, Effective, Safe, Accessible Fentanyl 50 mcg/hour - 1 patch QOD -Appropriate, Effective, Safe, Accessible Baclofen 20 mg PRN -Appropriate, Effective, Safe, Accessible -Recommended to continue current medication  GERD -Controlled - per pt report. She was referred to GI last year (10/2020) but never scheduled appt -Current treatment  Famotidine 20 mg BID - Appropriate, Effective, Safe, Accessible Tums PRN -Appropriate, Effective, Safe, Accessible -Recommended to  continue current medication -Recommend to call GI to scheduled colonoscopy  Osteopenia (Goal prevent fractures) -Controlled - DEXA scan pending for Aug 2023 -Last DEXA Scan: 05/2018   T-Score femoral neck: -1.8  T-Score total hip: -1.3  T-Score lumbar spine: -0.7  10-year  probability of major osteoporotic fracture: 8.8%  10-year probability of hip fracture: 1.0% -Patient is not a candidate for pharmacologic treatment -Current treatment  Vitamin D 5000 IU daily - Appropriate, Effective, Safe, Accessible -Medications previously tried: n/a  -Recommend [phone] units of vitamin D daily. -Recommended to continue current medication; keep DEXA scan appt  Constipation (Goal: improve BM) -Not ideally controlled -Current treatment  Docusate 100 mg - 2 tab HS Miralax PRN Senna PRN Mag citrate PRN -Medications previously tried: n/a -Reviewed importance of hydration and fiber for regular bowel movements; also discussed impact of chronic opioids  -Recommended to continue current medication  Health Maintenance -Vaccine gaps: Shingrix -Current therapy:  Lutein 40 mg Magnesium oxide 400 mg   Patient Goals/Self-Care Activities Patient will:  - take medications as prescribed as evidenced by patient report and record review focus on medication adherence by routine -follow up with rheumatology       Patient verbalizes understanding of instructions and care plan provided today and agrees to view in [employer]. Active MyChart status and patient understanding of how to access instructions and care plan via MyChart confirmed with patient.    Telephone follow up appointment with pharmacy team member scheduled for: 4 months  [name], [name], [facility] Clinical Pharmacist [facility] [phone]

## 2021-10-30 ENCOUNTER — Other Ambulatory Visit: Payer: Self-pay | Admitting: Otolaryngology

## 2021-11-21 ENCOUNTER — Ambulatory Visit: Payer: Medicare Other

## 2021-11-21 ENCOUNTER — Other Ambulatory Visit: Payer: Medicare Other

## 2021-11-28 ENCOUNTER — Other Ambulatory Visit: Payer: Self-pay | Admitting: Family Medicine

## 2021-12-17 ENCOUNTER — Other Ambulatory Visit: Payer: Self-pay | Admitting: Rheumatology

## 2021-12-17 NOTE — Telephone Encounter (Signed)
Next Visit: 01/02/2022  Last Visit: 07/03/2021  Last Fill: 09/21/2021  DX: Fibromyalgia, History of depression   Current Dose per office note on 07/03/2021: Cymbalta 60 mg 1 capsule by mouth daily.  Okay to refill cymbalta?

## 2021-12-19 NOTE — Progress Notes (Unsigned)
Office Visit Note  Patient: Lindsey [name]             Date of Birth: [birth year]           MRN: [id number]             PCP: Abner Greenspan, MD Referring: Tower, Wynelle Fanny, MD Visit Date: 01/02/2022 Occupation: '@GUAROCC'$ @  Subjective:  Chronic fatigue   History of Present Illness: Lindsey [name] is a 66 y.o. female with history of fibromyalgia and DDD.  Patient continues to experience generalized myalgias and muscle tenderness due to fibromyalgia.  She continues to experience recurrent flares of fibromyalgia.  She is taking cymbalta 60 mg 1 capsule by mouth daily.  She has noticed improvement while taking Cymbalta.  She has been taking baclofen very sparingly for muscle spasms.   She is scheduled for implantation of hypoglossal nerve stimulator on 01/22/2022. She would like a referral to physical therapy once she has recovered from the surgery. She is scheduled for an upcoming appointment with her PCP for her annual physical.  She had an updated bone density on 12/24/2021.  She also had updated lab work on 12/31/2021.   Activities of Daily Living:  Patient reports morning stiffness for all day. Patient Reports nocturnal pain.  Difficulty dressing/grooming: Denies Difficulty climbing stairs: Reports Difficulty getting out of chair: Reports Difficulty using hands for taps, buttons, cutlery, and/or writing: Reports  Review of Systems  Constitutional:  Positive for fatigue.  HENT:  Positive for mouth sores and mouth dryness.   Eyes:  Positive for dryness.  Respiratory:  Positive for shortness of breath.   Cardiovascular:  Negative for chest pain and palpitations.  Gastrointestinal:  Positive for constipation. Negative for blood in stool and diarrhea.  Endocrine: Positive for increased urination.  Genitourinary:  Positive for involuntary urination.  Musculoskeletal:  Positive for joint pain, joint pain, myalgias, muscle weakness, morning stiffness, muscle tenderness and myalgias. Negative for  gait problem and joint swelling.  Skin:  Positive for rash. Negative for color change, hair loss and sensitivity to sunlight.  Allergic/Immunologic: Negative for susceptible to infections.  Neurological:  Positive for dizziness and headaches.  Hematological:  Positive for swollen glands.  Psychiatric/Behavioral:  Positive for sleep disturbance. Negative for depressed mood. The patient is nervous/anxious.     PMFS History:  Patient Active Problem List   Diagnosis Date Noted   Colon cancer screening 11/06/2020   Elevated TSH 05/29/2020   Prediabetes 08/09/2018   Estrogen deficiency 01/28/2018   Autoimmune disease ([place]) 06/18/2017   History of recurrent cystitis ([place]) 08/16/2016   Pelvic floor dysfunction 08/16/2016   Dysuria 05/24/2016   Macular degeneration 04/09/2016   Osteoarthritis, knee 02/15/2016   S/P laparoscopic cholecystectomy June 2017 09/26/2015   Dermatitis 10/31/2014   Encounter for Medicare annual wellness exam 09/04/2012   HSV (herpes simplex virus) infection 04/08/2012   IBS (irritable bowel syndrome) 10/21/2011   Routine general medical examination at a health care facility 07/02/2011   Osteopenia 05/17/2009   Depressed mood 10/05/2008   GERD 05/05/2008   OSA (obstructive sleep apnea) 06/25/2007   Hyperlipidemia 05/31/2007   Essential hypertension 05/31/2007   Other fatigue 04/23/2007   Vitamin D deficiency 01/22/2007   Migraine headache 01/22/2007   Interstitial cystitis 01/22/2007   MENOPAUSE-RELATED VASOMOTOR SYMPTOMS 01/22/2007   OSTEOARTHROSIS, GENERALIZED, MULTIPLE SITES 01/22/2007   Fibromyalgia 01/22/2007   PATENT FORAMEN OVALE 01/22/2007    Past Medical History:  Diagnosis Date   Arthritis  Cataract    bil - not ready for surgery   Chronic fatigue    Early onset macular degeneration    Bilateral   Fibromyalgia    GERD (gastroesophageal reflux disease)    Heart murmur    Hemorrhoids    Hyperglycemia 12/24/2013   Hyperlipidemia     Hypersomnia    Hypertension    IBS (irritable bowel syndrome)    Interstitial cystitis    Migraines    OSA (obstructive sleep apnea)    Osteopenia    Pelvic floor dysfunction    PFO (patent foramen ovale)    Pneumonia    Pre-diabetes    Tendonitis    hand   Vitamin D deficiency     Family History  Problem Relation Age of Onset   Fibromyalgia Mother    Thyroid disease Mother    Diabetes Father    Alcohol abuse Father    Heart disease Father    Colon cancer Paternal Uncle    Diabetes Paternal Grandmother    Stroke Paternal Grandmother    Stomach cancer Paternal Grandfather    Diabetes Paternal Grandfather    Breast cancer Neg Hx    Past Surgical History:  Procedure Laterality Date   ANKLE ARTHROSCOPY Right    ANKLE FRACTURE SURGERY Left    BLADDER SURGERY     and hydrodistension   BREAST EXCISIONAL BIOPSY Bilateral    Multiple Benign Lumps removed     BREAST LUMPECTOMY     many, bilateral   CERVICAL DISCECTOMY  2006   fusion and plating   CHOLECYSTECTOMY N/A 09/26/2015   Procedure: LAPAROSCOPIC CHOLECYSTECTOMY WITH INTRAOPERATIVE CHOLANGIOGRAM;  Surgeon: Johnathan Hausen, MD;  Location: [facility] ORS;  Service: General;  Laterality: N/A;   COLONOSCOPY     DRUG INDUCED ENDOSCOPY Bilateral 10/17/2021   Procedure: DRUG INDUCED ENDOSCOPY;  Surgeon: [name], MD;  Location: [facility] OR;  Service: ENT;  Laterality: Bilateral;   FRACTURE SURGERY     ankle fracture x 2   NASAL SINUS SURGERY     multiple, x4   UPPER GASTROINTESTINAL ENDOSCOPY     VAGINAL HYSTERECTOMY  1990   because of the IC partial   Social History   Social History Narrative   Not on file   Immunization History  Administered Date(s) Administered   Influenza Inj Mdck Quad Pf 12/16/2017   Influenza Whole 12/31/2006   Influenza,inj,Quad PF,6+ Mos 12/24/2013, 02/20/2016, 10/30/2016, 11/30/2016, 11/26/2018   PFIZER(Purple Top)SARS-COV-2 Vaccination 06/18/2019, 07/13/2019, 03/06/2020   Pneumococcal  Polysaccharide-23 09/04/2012   Td 04/23/2007   Tdap 06/17/2017     Objective: Vital Signs: BP 137/84 (BP Location: Left Arm, Patient Position: Sitting, Cuff Size: Normal)   Pulse (!) 59   Resp 16   Ht '5\' 4"'$  (1.626 m)   Wt 185 lb 6.4 oz (84.1 kg)   BMI 31.82 kg/m    Physical Exam Vitals and nursing note reviewed.  Constitutional:      Appearance: She is well-developed.  HENT:     Head: Normocephalic and atraumatic.  Eyes:     Conjunctiva/sclera: Conjunctivae normal.  Cardiovascular:     Rate and Rhythm: Normal rate and regular rhythm.     Heart sounds: Normal heart sounds.  Pulmonary:     Effort: Pulmonary effort is normal.     Breath sounds: Normal breath sounds.  Abdominal:     General: Bowel sounds are normal.     Palpations: Abdomen is soft.  Musculoskeletal:     Cervical back: Normal  range of motion.  Skin:    General: Skin is warm and dry.     Capillary Refill: Capillary refill takes less than 2 seconds.  Neurological:     Mental Status: She is alert and oriented to person, place, and time.  Psychiatric:        Behavior: Behavior normal.      Musculoskeletal Exam: She has generalized hyperalgesia and positive tender points on examination.  C-spine has good range of motion.  Trapezius muscle tension tenderness bilaterally.  Shoulder joints, elbow joints, wrist joints, MCPs, PIPs, DIPs have good range of motion with no synovitis.  Some tenderness over the left CMC joint.  Left hip has slightly limited range of motion.  Right hip joint has good range of motion.  Knee joints have good range of motion with no warmth or effusion.  Ankle joints have good range of motion with no tenderness or joint swelling.  CDAI Exam: CDAI Score: -- Patient Global: --; Provider Global: -- Swollen: --; Tender: -- Joint Exam 01/02/2022   No joint exam has been documented for this visit   There is currently no information documented on the homunculus. Go to the Rheumatology activity  and complete the homunculus joint exam.  Investigation: No additional findings.  Imaging: MM 3D SCREEN BREAST BILATERAL  Result Date: 12/25/2021 CLINICAL DATA:  Screening. EXAM: DIGITAL SCREENING BILATERAL MAMMOGRAM WITH TOMOSYNTHESIS AND CAD TECHNIQUE: Bilateral screening digital craniocaudal and mediolateral oblique mammograms were obtained. Bilateral screening digital breast tomosynthesis was performed. The images were evaluated with computer-aided detection. COMPARISON:  Previous exam(s). ACR Breast Density Category d: The breast tissue is extremely dense, which lowers the sensitivity of mammography FINDINGS: There are no findings suspicious for malignancy. IMPRESSION: No mammographic evidence of malignancy. A result letter of this screening mammogram will be mailed directly to the patient. RECOMMENDATION: Screening mammogram in one year. (Code:SM-B-01Y) BI-RADS CATEGORY  1: Negative. Electronically Signed   By: Franki Cabot M.D.   On: 12/25/2021 08:54   DG BONE DENSITY (DXA)  Result Date: 12/24/2021 EXAM: DUAL X-RAY ABSORPTIOMETRY (DXA) FOR BONE MINERAL DENSITY IMPRESSION: Referring Physician:  [place] Your patient completed a bone mineral density test using GE Lunar iDXA system (analysis version: 16). Technologist: [place] PATIENT: Name: Cova, Knieriem Patient ID: [id number] Birth Date: 08-03-1955 Height: 62.5 in. Sex: Female Measured: 12/24/2021 Weight: 184.0 lbs. Indications: Caucasian, Estrogen Deficient, Hysterectomy, Postmenopausal Fractures: Left Ankle, Left Wrist, Right Ankle Treatments: Calcium (E943.0), Vitamin D (E933.5) ASSESSMENT: The BMD measured at Femur Neck Right is 0.807 g/cm2 with a T-score of -1.7. This patient is considered osteopenic/low bone mass according to [facility]) criteria. The quality of the exam is good. L4 was excluded due to degenerative changes. Site Region Measured Date Measured Age YA BMD Significant CHANGE T-score AP Spine L1-L3 12/24/2021  65.1 -1.4 0.997 g/cm2 DualFemur Neck Right 12/24/2021 65.1 -1.7 0.807 g/cm2 DualFemur Neck Right 05/13/2018 61.5 -1.6 0.813 g/cm2 DualFemur Total Mean 12/24/2021 65.1 -1.3 0.846 g/cm2 DualFemur Total Mean 05/13/2018 61.5 -1.3 0.841 g/cm2 Left Forearm Radius 33% 12/24/2021 65.1 -1.2 0.769 g/cm2 World Health Organization [facility]) criteria for post-menopausal, Caucasian Women: Normal       T-score at or above -1 SD Osteopenia   T-score between -1 and -2.5 SD Osteoporosis T-score at or below -2.5 SD RECOMMENDATION: 1. All patients should optimize calcium and vitamin D intake. 2. Consider FDA-approved medical therapies in postmenopausal women and men aged 30 years and older, based on the following: a. A hip  or vertebral (clinical or morphometric) fracture. b. T-score = -2.5 at the femoral neck or spine after appropriate evaluation to exclude secondary causes. c. Low bone mass (T-score between -1.0 and -2.5 at the femoral neck or spine) and a 10-year probability of a hip fracture = 3% or a 10-year probability of a major osteoporosis-related fracture = 20% based on the [place]-adapted WHO algorithm. d. Clinician judgment and/or patient preferences may indicate treatment for people with 10-year fracture probabilities above or below these levels. FOLLOW-UP: Patients with diagnosis of osteoporosis or at high risk for fracture should have regular bone mineral density tests.? Patients eligible for Medicare are allowed routine testing every 2 years.? The testing frequency can be increased to one year for patients who have rapidly progressing disease, are receiving or discontinuing medical therapy to restore bone mass, or have additional risk factors. I have reviewed this study and agree with the findings. [facility] FRAX* 10-year Probability of Fracture Based on femoral neck BMD: DualFemur (Right) Major Osteoporotic Fracture: 8.9% Hip Fracture:                1.0% Population:                  [place] (Caucasian) Risk  Factors:                None *FRAX is a Materials engineer of the [employer] for Metabolic Bone Disease, a World Pharmacologist (WHO) [facility]. ASSESSMENT: The probability of a major osteoporotic fracture is 8.9 % within the next ten years. The probability of a hip fracture is 1.0 % within the next ten years. Electronically Signed   By: Franki Cabot M.D.   On: 12/24/2021 13:33    Recent Labs: Lab Results  Component Value Date   WBC 6.2 12/31/2021   HGB 14.3 12/31/2021   PLT 219.0 12/31/2021   NA 137 12/31/2021   K 5.0 12/31/2021   CL 101 12/31/2021   CO2 31 12/31/2021   GLUCOSE 123 (H) 12/31/2021   BUN 9 12/31/2021   CREATININE 0.89 12/31/2021   BILITOT 0.6 12/31/2021   ALKPHOS 75 12/31/2021   AST 14 12/31/2021   ALT 15 12/31/2021   PROT 6.7 12/31/2021   ALBUMIN 4.0 12/31/2021   CALCIUM 9.8 12/31/2021   GFRAA 98 04/23/2007    Speciality Comments: No specialty comments available.  Procedures:  No procedures performed Allergies: Buspirone hcl, Crestor [rosuvastatin], Simvastatin, and Sulfa antibiotics   Assessment / Plan:     Visit Diagnoses: Fibromyalgia: She has generalized hyperalgesia and positive tender points on examination.  She remains on Cymbalta 60 mg 1 capsule daily.  She takes baclofen very sparingly as needed for muscle spasms.  She continues to experience recurrent fibromyalgia flares.  She has trapezius muscle tension and tenderness bilaterally.  She continues to have chronic fatigue secondary to insomnia.  She has interrupted sleep at night causing significant fatigue throughout the day.  In the past she was diagnosed with sleep apnea as well as hypersomnia. Patient is scheduled for implantation of hypoglossal nerve stimulator on 01/22/2022.  After she has recovered from the procedure she would like to consider physical therapy to help better manage her fibromyalgia pain.  I also discussed the possibility of water therapy.   She will notify us when and if she would like the referral to physical therapy placed in the future.  Discussed the importance of regular exercise and good sleep hygiene.  She will follow-up  in the office in 6 months or sooner if needed.  Other fatigue: Chronic and secondary to insomnia/hypersomnia.  Primary insomnia - She underwent nocturnal polysomnography on 02/14/2020 followed by a home sleep test on 04/19/2020.  She could not tolerate CPAP.  She continues to have difficulty sleeping at night leading to chronic fatigue throughout the day.  Discussed the importance of regular exercise and good sleep hygiene.  Primary osteoarthritis of both hands: She has PIP and DIP thickening consistent with osteoarthritis of both hands.  She has some tenderness over the left CMC joint.  Complete fist formation noted bilaterally.  No tenderness or synovitis over MCP joints.  She uses Voltaren gel topically as needed for pain relief.  Discussed the importance of joint protection and muscle strengthening.  Trigger thumb, left thumb - Resolved.  Chondromalacia of both patellae - X-rays were consistent with bilateral moderate chondromalacia.  Bilateral knee crepitus noted.  No warmth or effusion noted on examination today.  She uses Voltaren gel topically as needed for pain relief.  Osteopenia of multiple sites - DEXA 05/13/18: The BMD measured at Femur Neck Left is 0.793 g/cm2 with a T-score of -1.8. DEXA 12/24/21:  The BMD measured at Femur Neck Right is 0.807 g/cm2 with a T-score of -1.7.  Review DEXA results with the patient today in the office.  Discussed the recommendations of taking a calcium and vitamin D supplement daily as well as performing resistive exercises.  Next DEXA will be due in September 2025.  History of vitamin D deficiency: She takes vitamin D 5000 units daily.  Other medical conditions are listed as follows:  History of hypertension: Blood pressure was 137/84 today in the office.  History of  hyperglycemia  History of hyperlipidemia  Interstitial cystitis - She is followed by Dr. Amalia Hailey.   History of depression -She remains on Cymbalta 60 mg 1 capsule by mouth daily.  S/P laparoscopic cholecystectomy June 2017  History of IBS  OSA (obstructive sleep apnea)  Orders: No orders of the defined types were placed in this encounter.  No orders of the defined types were placed in this encounter.    Follow-Up Instructions: Return in about 6 months (around 07/04/2022) for Fibromyalgia, DDD.   Ofilia Neas, PA-C  Note - This record has been created using Dragon software.  Chart creation errors have been sought, but may not always  have been located. Such creation errors do not reflect on  the standard of medical care.

## 2021-12-24 ENCOUNTER — Ambulatory Visit
Admission: RE | Admit: 2021-12-24 | Discharge: 2021-12-24 | Disposition: A | Discharge status: Home / Self Care | Payer: Medicare Other | Source: Ambulatory Visit | Attending: Family Medicine | Admitting: Family Medicine

## 2021-12-24 ENCOUNTER — Other Ambulatory Visit: Payer: Self-pay | Admitting: Family Medicine

## 2021-12-24 DIAGNOSIS — E2839 Other primary ovarian failure: Secondary | ICD-10-CM

## 2021-12-24 DIAGNOSIS — Z1231 Encounter for screening mammogram for malignant neoplasm of breast: Secondary | ICD-10-CM

## 2021-12-25 NOTE — Progress Notes (Signed)
DEXA scan is consistent with osteopenia.  Please advise patient to take calcium 1200 mg a day between dietary and supplement.  Vitamin D 1000 units daily.  Please advise resistive exercises..  We will repeat DEXA scan in 2 years.

## 2021-12-27 NOTE — Telephone Encounter (Signed)
Patient has been scheduled

## 2021-12-27 NOTE — Telephone Encounter (Signed)
Pt hasn't been seen in over a year please call and schedule her part 2 CPE (labs prior) and then route back to me to refill, thanks

## 2021-12-30 ENCOUNTER — Telehealth: Payer: Self-pay | Admitting: Family Medicine

## 2021-12-30 DIAGNOSIS — E7800 Pure hypercholesterolemia, unspecified: Secondary | ICD-10-CM

## 2021-12-30 DIAGNOSIS — E559 Vitamin D deficiency, unspecified: Secondary | ICD-10-CM

## 2021-12-30 DIAGNOSIS — R7303 Prediabetes: Secondary | ICD-10-CM

## 2021-12-30 DIAGNOSIS — R7989 Other specified abnormal findings of blood chemistry: Secondary | ICD-10-CM

## 2021-12-30 DIAGNOSIS — I10 Essential (primary) hypertension: Secondary | ICD-10-CM

## 2021-12-30 NOTE — Telephone Encounter (Signed)
-----   Message from Lindsey [name] sent at 12/28/2021  2:35 PM EDT ----- Regarding: Lab orders for Monday, 10.2.23 Patient is scheduled for CPX labs, please order future labs, Thanks , Karna Christmas

## 2021-12-31 ENCOUNTER — Other Ambulatory Visit (INDEPENDENT_AMBULATORY_CARE_PROVIDER_SITE_OTHER): Payer: Medicare Other

## 2021-12-31 DIAGNOSIS — R7989 Other specified abnormal findings of blood chemistry: Secondary | ICD-10-CM | POA: Diagnosis not present

## 2021-12-31 DIAGNOSIS — R7303 Prediabetes: Secondary | ICD-10-CM | POA: Diagnosis not present

## 2021-12-31 DIAGNOSIS — I10 Essential (primary) hypertension: Secondary | ICD-10-CM | POA: Diagnosis not present

## 2021-12-31 DIAGNOSIS — E559 Vitamin D deficiency, unspecified: Secondary | ICD-10-CM | POA: Diagnosis not present

## 2021-12-31 DIAGNOSIS — E7800 Pure hypercholesterolemia, unspecified: Secondary | ICD-10-CM

## 2021-12-31 LAB — CBC WITH DIFFERENTIAL/PLATELET
Basophils Absolute: 0.1 10*3/uL (ref 0.0–0.1)
Basophils Relative: 1.4 % (ref 0.0–3.0)
Eosinophils Absolute: 0.1 10*3/uL (ref 0.0–0.7)
Eosinophils Relative: 2.4 % (ref 0.0–5.0)
HCT: 43 % (ref 36.0–46.0)
Hemoglobin: 14.3 g/dL (ref 12.0–15.0)
Lymphocytes Relative: 41.1 % (ref 12.0–46.0)
Lymphs Abs: 2.5 10*3/uL (ref 0.7–4.0)
MCHC: 33.1 g/dL (ref 30.0–36.0)
MCV: 88.6 fl (ref 78.0–100.0)
Monocytes Absolute: 0.4 10*3/uL (ref 0.1–1.0)
Monocytes Relative: 6.8 % (ref 3.0–12.0)
Neutro Abs: 3 10*3/uL (ref 1.4–7.7)
Neutrophils Relative %: 48.3 % (ref 43.0–77.0)
Platelets: 219 10*3/uL (ref 150.0–400.0)
RBC: 4.86 Mil/uL (ref 3.87–5.11)
RDW: 13.9 % (ref 11.5–15.5)
WBC: 6.2 10*3/uL (ref 4.0–10.5)

## 2021-12-31 LAB — COMPREHENSIVE METABOLIC PANEL
ALT: 15 U/L (ref 0–35)
AST: 14 U/L (ref 0–37)
Albumin: 4 g/dL (ref 3.5–5.2)
Alkaline Phosphatase: 75 U/L (ref 39–117)
BUN: 9 mg/dL (ref 6–23)
CO2: 31 mEq/L (ref 19–32)
Calcium: 9.8 mg/dL (ref 8.4–10.5)
Chloride: 101 mEq/L (ref 96–112)
Creatinine, Ser: 0.89 mg/dL (ref 0.40–1.20)
GFR: 68.14 mL/min (ref >60.00)
Glucose, Bld: 123 mg/dL — ABNORMAL HIGH (ref 70–99)
Potassium: 5 mEq/L (ref 3.5–5.1)
Sodium: 137 mEq/L (ref 135–145)
Total Bilirubin: 0.6 mg/dL (ref 0.2–1.2)
Total Protein: 6.7 g/dL (ref 6.0–8.3)

## 2021-12-31 LAB — LIPID PANEL
Cholesterol: 200 mg/dL (ref 0–200)
HDL: 45.5 mg/dL (ref >39.00)
LDL Cholesterol: 120 mg/dL — ABNORMAL HIGH (ref 0–99)
NonHDL: 154.35
Total CHOL/HDL Ratio: 4
Triglycerides: 171 mg/dL — ABNORMAL HIGH (ref 0.0–149.0)
VLDL: 34.2 mg/dL (ref 0.0–40.0)

## 2021-12-31 LAB — TSH: TSH: 3.2 u[IU]/mL (ref 0.35–5.50)

## 2021-12-31 LAB — HEMOGLOBIN A1C: Hgb A1c MFr Bld: 6.2 % (ref 4.6–6.5)

## 2021-12-31 LAB — VITAMIN D 25 HYDROXY (VIT D DEFICIENCY, FRACTURES): VITD: 55.66 ng/mL (ref 30.00–100.00)

## 2022-01-02 ENCOUNTER — Ambulatory Visit: Payer: Medicare Other | Attending: Physician Assistant | Admitting: Physician Assistant

## 2022-01-02 ENCOUNTER — Encounter: Payer: Self-pay | Admitting: Physician Assistant

## 2022-01-02 VITALS — BP 137/84 | HR 59 | Resp 16 | Ht 64.0 in | Wt 185.4 lb

## 2022-01-02 DIAGNOSIS — M19041 Primary osteoarthritis, right hand: Secondary | ICD-10-CM

## 2022-01-02 DIAGNOSIS — F5101 Primary insomnia: Secondary | ICD-10-CM | POA: Diagnosis not present

## 2022-01-02 DIAGNOSIS — M65312 Trigger thumb, left thumb: Secondary | ICD-10-CM | POA: Diagnosis present

## 2022-01-02 DIAGNOSIS — M8589 Other specified disorders of bone density and structure, multiple sites: Secondary | ICD-10-CM

## 2022-01-02 DIAGNOSIS — Z9049 Acquired absence of other specified parts of digestive tract: Secondary | ICD-10-CM | POA: Diagnosis present

## 2022-01-02 DIAGNOSIS — M2241 Chondromalacia patellae, right knee: Secondary | ICD-10-CM

## 2022-01-02 DIAGNOSIS — Z8719 Personal history of other diseases of the digestive system: Secondary | ICD-10-CM

## 2022-01-02 DIAGNOSIS — Z8639 Personal history of other endocrine, nutritional and metabolic disease: Secondary | ICD-10-CM

## 2022-01-02 DIAGNOSIS — Z8679 Personal history of other diseases of the circulatory system: Secondary | ICD-10-CM

## 2022-01-02 DIAGNOSIS — M2242 Chondromalacia patellae, left knee: Secondary | ICD-10-CM

## 2022-01-02 DIAGNOSIS — G4733 Obstructive sleep apnea (adult) (pediatric): Secondary | ICD-10-CM

## 2022-01-02 DIAGNOSIS — M19042 Primary osteoarthritis, left hand: Secondary | ICD-10-CM

## 2022-01-02 DIAGNOSIS — Z8659 Personal history of other mental and behavioral disorders: Secondary | ICD-10-CM | POA: Diagnosis present

## 2022-01-02 DIAGNOSIS — R5383 Other fatigue: Secondary | ICD-10-CM

## 2022-01-02 DIAGNOSIS — N3010 Interstitial cystitis (chronic) without hematuria: Secondary | ICD-10-CM

## 2022-01-02 DIAGNOSIS — M797 Fibromyalgia: Secondary | ICD-10-CM | POA: Diagnosis not present

## 2022-01-07 ENCOUNTER — Ambulatory Visit (INDEPENDENT_AMBULATORY_CARE_PROVIDER_SITE_OTHER): Payer: Medicare Other | Admitting: Family Medicine

## 2022-01-07 ENCOUNTER — Encounter: Payer: Self-pay | Admitting: Family Medicine

## 2022-01-07 VITALS — BP 112/70 | HR 60 | Temp 98.0°F | Ht 62.75 in | Wt 186.4 lb

## 2022-01-07 DIAGNOSIS — Z23 Encounter for immunization: Secondary | ICD-10-CM | POA: Diagnosis not present

## 2022-01-07 DIAGNOSIS — G4733 Obstructive sleep apnea (adult) (pediatric): Secondary | ICD-10-CM

## 2022-01-07 DIAGNOSIS — M8589 Other specified disorders of bone density and structure, multiple sites: Secondary | ICD-10-CM

## 2022-01-07 DIAGNOSIS — K589 Irritable bowel syndrome without diarrhea: Secondary | ICD-10-CM

## 2022-01-07 DIAGNOSIS — M797 Fibromyalgia: Secondary | ICD-10-CM

## 2022-01-07 DIAGNOSIS — Z1211 Encounter for screening for malignant neoplasm of colon: Secondary | ICD-10-CM

## 2022-01-07 DIAGNOSIS — E7800 Pure hypercholesterolemia, unspecified: Secondary | ICD-10-CM | POA: Diagnosis not present

## 2022-01-07 DIAGNOSIS — G43909 Migraine, unspecified, not intractable, without status migrainosus: Secondary | ICD-10-CM

## 2022-01-07 DIAGNOSIS — I10 Essential (primary) hypertension: Secondary | ICD-10-CM

## 2022-01-07 DIAGNOSIS — E559 Vitamin D deficiency, unspecified: Secondary | ICD-10-CM

## 2022-01-07 DIAGNOSIS — R7303 Prediabetes: Secondary | ICD-10-CM

## 2022-01-07 DIAGNOSIS — R4589 Other symptoms and signs involving emotional state: Secondary | ICD-10-CM

## 2022-01-07 MED ORDER — EZETIMIBE 10 MG PO TABS: 10.0000 mg | ORAL_TABLET | Freq: Every day | ORAL | 3 refills | Status: DC

## 2022-01-07 MED ORDER — ATENOLOL 100 MG PO TABS: 100.0000 mg | ORAL_TABLET | Freq: Every day | ORAL | 3 refills | Status: DC

## 2022-01-07 NOTE — Progress Notes (Signed)
Subjective:    Patient ID: Lindsey [name], female    DOB: Nov 15, 1955, 66 y.o.   MRN: [id number]  HPI Pt presents for annual f/u of chronic health problems   Wt Readings from Last 3 Encounters:  01/07/22 186 lb 6 oz (84.5 kg)  01/02/22 185 lb 6.4 oz (84.1 kg)  10/17/21 185 lb (83.9 kg)   33.28 kg/m  Not doing a lot  Not feeling great     Immunization History  Administered Date(s) Administered   Influenza Inj Mdck Quad Pf 12/16/2017   Influenza Whole 12/31/2006   Influenza,inj,Quad PF,6+ Mos 12/24/2013, 02/20/2016, 10/30/2016, 11/30/2016, 11/26/2018   PFIZER(Purple Top)SARS-COV-2 Vaccination 06/18/2019, 07/13/2019, 03/06/2020   Pneumococcal Polysaccharide-23 09/04/2012   Td 04/23/2007   Tdap 06/17/2017   Health Maintenance Due  Topic Date Due   Zoster Vaccines- Shingrix (1 of 2) Never done   COLONOSCOPY (Pts 45-90yr Insurance coverage will need to be confirmed)  08/08/2020   Pneumonia Vaccine 65 Years old (2 - PCV) 10/20/2021   Had flu shot this fall  Shingrix- interested   Colonoscopy 07/2010 A lot going on and has not scheduled it  A lot of appointments - wants to wait until after jan 1    Pna vaccine due (pna 23 in 2014)  Will do prevnar 20 today   Mammogram 11/2021 Self breast exam: no lumps   Does not see a gyn   Dexa 11/2021 osteopenia /stable  Falls: none  Fractures: none  Supplements : vitamin D , takes antacid for calcium  Vit D level of 55.6  Exercise : nothing/ cannot tolerate any with fibromyalgia right now and knee problems  A lot of pain   Sees rheumatology    HTN bp is stable today  No cp or palpitations or headaches or edema  No side effects to medicines  BP Readings from Last 3 Encounters:  01/07/22 112/70  01/02/22 137/84  10/17/21 136/69    Pulse Readings from Last 3 Encounters:  01/07/22 60  01/02/22 (!) 59  10/17/21 61    Atenolol 100 mg daily for this and headaches   Lab Results  Component Value Date   CREATININE  0.89 12/31/2021   BUN 9 12/31/2021   NA 137 12/31/2021   K 5.0 12/31/2021   CL 101 12/31/2021   CO2 31 12/31/2021     OSA : is getting an inspire implant  Excited about that  Should make her feel better      Lab Results  Component Value Date   TSH 3.20 12/31/2021    Hyperlipidemia Lab Results  Component Value Date   CHOL 200 12/31/2021   CHOL 194 12/19/2020   CHOL 232 (H) 05/30/2020   Lab Results  Component Value Date   HDL 45.50 12/31/2021   HDL 51.50 12/19/2020   HDL 51.70 05/30/2020   Lab Results  Component Value Date   LDLCALC 120 (H) 12/31/2021   LDLCALC 117 (H) 12/19/2020   LDLCALC 150 (H) 05/30/2020   Lab Results  Component Value Date   TRIG 171.0 (H) 12/31/2021   TRIG 129.0 12/19/2020   TRIG 152.0 (H) 05/30/2020   Lab Results  Component Value Date   CHOLHDL 4 12/31/2021   CHOLHDL 4 12/19/2020   CHOLHDL 4 05/30/2020   Lab Results  Component Value Date   LDLDIRECT 137.4 11/27/2012   LDLDIRECT 166.1 07/24/2012   LDLDIRECT 161.5 07/03/2011   Taking zetia Was intol to low dose statin/ pain   Diet :  Pretty bad  Very picky due to GI intolerances and chronic nausea  Can only eat very small amt at a time  Nothing was helping  Has IBS   Prediabetes Lab Results  Component Value Date   HGBA1C 6.2 12/31/2021   Down from 6.3  Lab Results  Component Value Date   ALT 15 12/31/2021   AST 14 12/31/2021   ALKPHOS 75 12/31/2021   BILITOT 0.6 12/31/2021   Lab Results  Component Value Date   WBC 6.2 12/31/2021   HGB 14.3 12/31/2021   HCT 43.0 12/31/2021   MCV 88.6 12/31/2021   PLT 219.0 12/31/2021    Patient Active Problem List   Diagnosis Date Noted   Colon cancer screening 11/06/2020   Elevated TSH 05/29/2020   Prediabetes 08/09/2018   Estrogen deficiency 01/28/2018   Autoimmune disease ([place]) 06/18/2017   History of recurrent cystitis (IC) 08/16/2016   Pelvic floor dysfunction 08/16/2016   Dysuria 05/24/2016   Macular  degeneration 04/09/2016   Osteoarthritis, knee 02/15/2016   S/P laparoscopic cholecystectomy June 2017 09/26/2015   Dermatitis 10/31/2014   Encounter for Medicare annual wellness exam 09/04/2012   HSV (herpes simplex virus) infection 04/08/2012   IBS (irritable bowel syndrome) 10/21/2011   Routine general medical examination at a health care facility 07/02/2011   Osteopenia 05/17/2009   Depressed mood 10/05/2008   GERD 05/05/2008   OSA (obstructive sleep apnea) 06/25/2007   Hyperlipidemia 05/31/2007   Essential hypertension 05/31/2007   Other fatigue 04/23/2007   Vitamin D deficiency 01/22/2007   Migraine headache 01/22/2007   Interstitial cystitis 01/22/2007   MENOPAUSE-RELATED VASOMOTOR SYMPTOMS 01/22/2007   OSTEOARTHROSIS, GENERALIZED, MULTIPLE SITES 01/22/2007   Fibromyalgia 01/22/2007   PATENT FORAMEN OVALE 01/22/2007   Past Medical History:  Diagnosis Date   Arthritis    Cataract    bil - not ready for surgery   Chronic fatigue    Early onset macular degeneration    Bilateral   Fibromyalgia    GERD (gastroesophageal reflux disease)    Heart murmur    Hemorrhoids    Hyperglycemia 12/24/2013   Hyperlipidemia    Hypersomnia    Hypertension    IBS (irritable bowel syndrome)    Interstitial cystitis    Migraines    OSA (obstructive sleep apnea)    Osteopenia    Pelvic floor dysfunction    PFO (patent foramen ovale)    Pneumonia    Pre-diabetes    Tendonitis    hand   Vitamin D deficiency    Past Surgical History:  Procedure Laterality Date   ANKLE ARTHROSCOPY Right    ANKLE FRACTURE SURGERY Left    BLADDER SURGERY     and hydrodistension   BREAST EXCISIONAL BIOPSY Bilateral    Multiple Benign Lumps removed     BREAST LUMPECTOMY     many, bilateral   CERVICAL DISCECTOMY  2006   fusion and plating   CHOLECYSTECTOMY N/A 09/26/2015   Procedure: LAPAROSCOPIC CHOLECYSTECTOMY WITH INTRAOPERATIVE CHOLANGIOGRAM;  Surgeon: Johnathan Hausen, MD;  Location: [facility]  ORS;  Service: General;  Laterality: N/A;   COLONOSCOPY     DRUG INDUCED ENDOSCOPY Bilateral 10/17/2021   Procedure: DRUG INDUCED ENDOSCOPY;  Surgeon: [name], MD;  Location: [facility] OR;  Service: ENT;  Laterality: Bilateral;   FRACTURE SURGERY     ankle fracture x 2   NASAL SINUS SURGERY     multiple, x4   UPPER GASTROINTESTINAL ENDOSCOPY     VAGINAL HYSTERECTOMY  1990  because of the IC partial   Social History   Tobacco Use   Smoking status: Never    Passive exposure: Past   Smokeless tobacco: Never  Vaping Use   Vaping Use: Never used  Substance Use Topics   Alcohol use: No    Alcohol/week: 0.0 standard drinks of alcohol   Drug use: No   Family History  Problem Relation Age of Onset   Fibromyalgia Mother    Thyroid disease Mother    Diabetes Father    Alcohol abuse Father    Heart disease Father    Colon cancer Paternal Uncle    Diabetes Paternal Grandmother    Stroke Paternal Grandmother    Stomach cancer Paternal Grandfather    Diabetes Paternal Grandfather    Breast cancer Neg Hx    Allergies  Allergen Reactions   Buspirone Hcl     REACTION: dystonia   Crestor [Rosuvastatin]     Muscle pain     Simvastatin     REACTION: malaise   Sulfa Antibiotics     nausea   Current Outpatient Medications on File Prior to Visit  Medication Sig Dispense Refill   baclofen (LIORESAL) 20 MG tablet Take 20 mg by mouth daily as needed for muscle spasms.     botulinum toxin Type A (BOTOX) 100 units SOLR injection Inject 200 Units into the muscle every 3 (three) months.     calcium carbonate (TUMS - DOSED IN MG ELEMENTAL CALCIUM) 500 MG chewable tablet Chew 1-2 tablets by mouth 2 (two) times daily as needed for indigestion or heartburn.     Cholecalciferol (VITAMIN D-3) 125 MCG (5000 UT) TABS Take 5,000 Units by mouth every evening.     diclofenac sodium (VOLTAREN) 1 % GEL Apply 2 to 4 gram to affected joints up to 4 times daily 400 g 2   docusate sodium (COLACE) 100 MG  capsule Take 200 mg by mouth at bedtime.     DULoxetine (CYMBALTA) 60 MG capsule TAKE 1 CAPSULE BY MOUTH EVERY DAY 90 capsule 0   famotidine (PEPCID) 20 MG tablet Take 20 mg by mouth daily as needed for heartburn or indigestion.     fentaNYL (DURAGESIC - DOSED MCG/HR) 50 MCG/HR Place 50 mcg onto the skin every other day.     hydrOXYzine (ATARAX/VISTARIL) 50 MG tablet Take 50 mg by mouth at bedtime.     ibuprofen (ADVIL) 200 MG tablet Take 400 mg by mouth every 8 (eight) hours as needed (pain.).     lidocaine (XYLOCAINE) 5 % ointment Apply 1 Application topically daily as needed (pain.).     LUTEIN PO Take 40 mg by mouth at bedtime.     magnesium oxide (MAG-OX) 400 (240 Mg) MG tablet Take 400 mg by mouth at bedtime.     Polyethyl Glycol-Propyl Glycol (LUBRICANT EYE DROPS) 0.4-0.3 % SOLN Place 1-2 drops into both eyes 3 (three) times daily as needed (dry/irritated eyes).     polyethylene glycol powder (GLYCOLAX/MIRALAX) 17 GM/SCOOP powder Take 17 g by mouth daily as needed (constipation.).     No current facility-administered medications on file prior to visit.      Review of Systems  Constitutional:  Positive for fatigue. Negative for activity change, appetite change, fever and unexpected weight change.  HENT:  Negative for congestion, ear pain, rhinorrhea, sinus pressure and sore throat.   Eyes:  Negative for pain, redness and visual disturbance.  Respiratory:  Negative for cough, shortness of breath and wheezing.  Cardiovascular:  Negative for chest pain and palpitations.  Gastrointestinal:  Negative for abdominal pain, blood in stool, constipation and diarrhea.  Endocrine: Negative for polydipsia and polyuria.  Genitourinary:  Negative for dysuria, frequency and urgency.  Musculoskeletal:  Positive for arthralgias, back pain and myalgias.  Skin:  Negative for pallor and rash.  Allergic/Immunologic: Negative for environmental allergies.  Neurological:  Negative for dizziness, syncope  and headaches.  Hematological:  Negative for adenopathy. Does not bruise/bleed easily.  Psychiatric/Behavioral:  Negative for decreased concentration and dysphoric mood. The patient is not nervous/anxious.        Objective:   Physical Exam Constitutional:      General: She is not in acute distress.    Appearance: Normal appearance. She is well-developed. She is obese. She is not ill-appearing or diaphoretic.  HENT:     Head: Normocephalic and atraumatic.     Right Ear: Tympanic membrane, ear canal and external ear normal.     Left Ear: Tympanic membrane, ear canal and external ear normal.     Nose: Nose normal. No congestion.     Mouth/Throat:     Mouth: Mucous membranes are moist.     Pharynx: Oropharynx is clear. No posterior oropharyngeal erythema.  Eyes:     General: No scleral icterus.    Extraocular Movements: Extraocular movements intact.     Conjunctiva/sclera: Conjunctivae normal.     Pupils: Pupils are equal, round, and reactive to light.  Neck:     Thyroid: No thyromegaly.     Vascular: No carotid bruit or JVD.  Cardiovascular:     Rate and Rhythm: Normal rate and regular rhythm.     Pulses: Normal pulses.     Heart sounds: Normal heart sounds.     No gallop.  Pulmonary:     Effort: Pulmonary effort is normal. No respiratory distress.     Breath sounds: Normal breath sounds. No wheezing.     Comments: Good air exch Chest:     Chest wall: No tenderness.  Abdominal:     General: Bowel sounds are normal. There is no distension or abdominal bruit.     Palpations: Abdomen is soft. There is no mass.     Tenderness: There is no abdominal tenderness.     Hernia: No hernia is present.  Genitourinary:    Comments: Breast exam: No mass, nodules, thickening, tenderness, bulging, retraction, inflamation, nipple discharge or skin changes noted.  No axillary or clavicular LA.     Musculoskeletal:        General: No tenderness. Normal range of motion.     Cervical back:  Normal range of motion and neck supple. No rigidity. No muscular tenderness.     Right lower leg: No edema.     Left lower leg: No edema.     Comments: No kyphosis   Lymphadenopathy:     Cervical: No cervical adenopathy.  Skin:    General: Skin is warm and dry.     Coloration: Skin is not pale.     Findings: No erythema or rash.     Comments: Fair  Solar lentigines diffusely   Neurological:     Mental Status: She is alert. Mental status is at baseline.     Cranial Nerves: No cranial nerve deficit.     Motor: No abnormal muscle tone.     Coordination: Coordination normal.     Gait: Gait normal.     Deep Tendon Reflexes: Reflexes are normal and symmetric. Reflexes  normal.  Psychiatric:        Mood and Affect: Mood normal.        Cognition and Memory: Cognition and memory normal.     Comments: Pleasant Candidly discusses symptoms and stressors             Assessment & Plan:   Problem List Items Addressed This Visit       Cardiovascular and Mediastinum   Essential hypertension - Primary    bp in fair control at this time  BP Readings from Last 1 Encounters:  01/07/22 112/70  No changes needed Most recent labs reviewed  Disc lifstyle change with low sodium diet and exercise  Atenolol 100 mg daily  Pulse rate of 60        Relevant Medications   atenolol (TENORMIN) 100 MG tablet   ezetimibe (ZETIA) 10 MG tablet   Migraine headache    Continues atenolol 100 mg daily which helps       Relevant Medications   atenolol (TENORMIN) 100 MG tablet   ezetimibe (ZETIA) 10 MG tablet     Respiratory   OSA (obstructive sleep apnea)    Planning procedure for inspire device since she does not tolerate cpap        Digestive   IBS (irritable bowel syndrome)    Intolerant of many foods so hard to get a balanced diet         Musculoskeletal and Integument   Osteopenia    dexa 11/2021 rev  No falls or fx  Disc fall prev  Taking vit D Disc opt for exercise (limited  due to pain)         Other   Colon cancer screening    Referral done for screen colonoscopy / 10 year  Sees Dr Carlean Purl Wants to schedule after jan 1      Relevant Orders   Ambulatory referral to Gastroenterology   Depressed mood    Continues cymbalta 60 mg daily from rheumatology      Fibromyalgia    Chronic pain  Limits her activity and exercise   Sees rheumatology  Also tx for depression  Has fentanyl patch       Hyperlipidemia    Disc goals for lipids and reasons to control them Rev last labs with pt Rev low sat fat diet in detail  Intol of statins Able to take zetia 10 mg daily  LDL is 120- stable  Limited diet due to GI issues/chronic      Relevant Medications   atenolol (TENORMIN) 100 MG tablet   ezetimibe (ZETIA) 10 MG tablet   Prediabetes    Lab Results  Component Value Date   HGBA1C 6.2 12/31/2021  disc imp of low glycemic diet and wt loss to prevent DM2       Vitamin D deficiency    Vit D level is 55.6 on current oral suppl Vitamin D level is therapeutic with current supplementation Disc importance of this to bone and overall health       Other Visit Diagnoses     Need for vaccination with 20-polyvalent pneumococcal conjugate vaccine       Relevant Orders   Pneumococcal conjugate vaccine 20-valent (Prevnar 20) (Completed)

## 2022-01-07 NOTE — Assessment & Plan Note (Signed)
bp in fair control at this time  BP Readings from Last 1 Encounters:  01/07/22 112/70   No changes needed Most recent labs reviewed  Disc lifstyle change with low sodium diet and exercise  Atenolol 100 mg daily  Pulse rate of 60

## 2022-01-07 NOTE — Assessment & Plan Note (Signed)
Vit D level is 55.6 on current oral suppl Vitamin D level is therapeutic with current supplementation Disc importance of this to bone and overall health

## 2022-01-07 NOTE — Assessment & Plan Note (Signed)
Planning procedure for inspire device since she does not tolerate cpap

## 2022-01-07 NOTE — Patient Instructions (Addendum)
If you are interested in the new shingles vaccine (Shingrix) - call your local pharmacy to check on coverage and availability  If affordable, get on a wait list at your pharmacy to get the vaccine.   I will place a referral for colonsocopy   Call [facility] to schedule    [facility]  [phone]  Pneumonia vaccine today   For exercise -consider chair yoga program on line   For cholesterol  Avoid red meat/ fried foods/ egg yolks/ fatty breakfast meats/ butter, cheese and high fat dairy/ and shellfish

## 2022-01-07 NOTE — Assessment & Plan Note (Signed)
Referral done for screen colonoscopy / 10 year  Sees Dr Carlean Purl Wants to schedule after jan 1

## 2022-01-07 NOTE — Assessment & Plan Note (Signed)
Continues cymbalta 60 mg daily from rheumatology

## 2022-01-07 NOTE — Assessment & Plan Note (Signed)
Disc goals for lipids and reasons to control them Rev last labs with pt Rev low sat fat diet in detail  Intol of statins Able to take zetia 10 mg daily  LDL is 120- stable  Limited diet due to GI issues/chronic

## 2022-01-07 NOTE — Assessment & Plan Note (Signed)
Continues atenolol 100 mg daily which helps

## 2022-01-07 NOTE — Assessment & Plan Note (Signed)
Lab Results  Component Value Date   HGBA1C 6.2 12/31/2021   disc imp of low glycemic diet and wt loss to prevent DM2

## 2022-01-07 NOTE — Assessment & Plan Note (Signed)
Chronic pain  Limits her activity and exercise   Sees rheumatology  Also tx for depression  Has fentanyl patch

## 2022-01-07 NOTE — Assessment & Plan Note (Deleted)
Reviewed health habits including diet and exercise and skin cancer prevention Reviewed appropriate screening tests for age  Also reviewed health mt list, fam hx and immunization status , as well as social and family history   See HPI Labs reviewed  Discussed shingrix vaccine, she plans to get at the pharmacy  prevnar 20 vaccine today  Mammogram utd 11/2021 dexa utd 11/2021, no falls or fx with osteopenia, taking vit D Disc exercise options with fibromyalgia like water or bike (low impact)  Continues rheumatology care  GI referral for screen colonsocpy - is due/ wants to do after jan 1  Planning inspire device impl for OSA

## 2022-01-07 NOTE — Assessment & Plan Note (Signed)
Intolerant of many foods so hard to get a balanced diet

## 2022-01-07 NOTE — Assessment & Plan Note (Signed)
dexa 11/2021 rev  No falls or fx  Disc fall prev  Taking vit D Disc opt for exercise (limited due to pain)

## 2022-01-11 ENCOUNTER — Other Ambulatory Visit: Payer: Self-pay

## 2022-01-11 ENCOUNTER — Encounter (HOSPITAL_BASED_OUTPATIENT_CLINIC_OR_DEPARTMENT_OTHER): Payer: Self-pay | Admitting: Otolaryngology

## 2022-01-18 NOTE — Progress Notes (Signed)

## 2022-01-22 ENCOUNTER — Ambulatory Visit (HOSPITAL_BASED_OUTPATIENT_CLINIC_OR_DEPARTMENT_OTHER)
Admission: RE | Admit: 2022-01-22 | Discharge: 2022-01-22 | Disposition: A | Discharge status: Home / Self Care | Payer: Medicare Other | Attending: Otolaryngology | Admitting: Otolaryngology

## 2022-01-22 ENCOUNTER — Other Ambulatory Visit: Payer: Self-pay

## 2022-01-22 ENCOUNTER — Ambulatory Visit (HOSPITAL_COMMUNITY): Payer: Medicare Other

## 2022-01-22 ENCOUNTER — Ambulatory Visit (HOSPITAL_BASED_OUTPATIENT_CLINIC_OR_DEPARTMENT_OTHER): Payer: Medicare Other | Admitting: Certified Registered"

## 2022-01-22 ENCOUNTER — Encounter (HOSPITAL_BASED_OUTPATIENT_CLINIC_OR_DEPARTMENT_OTHER)
Admission: RE | Disposition: A | Discharge status: Home / Self Care | Payer: Self-pay | Source: Home / Self Care | Attending: Otolaryngology

## 2022-01-22 ENCOUNTER — Encounter (HOSPITAL_BASED_OUTPATIENT_CLINIC_OR_DEPARTMENT_OTHER): Payer: Self-pay | Admitting: Otolaryngology

## 2022-01-22 DIAGNOSIS — E669 Obesity, unspecified: Secondary | ICD-10-CM | POA: Insufficient documentation

## 2022-01-22 DIAGNOSIS — I10 Essential (primary) hypertension: Secondary | ICD-10-CM | POA: Diagnosis not present

## 2022-01-22 DIAGNOSIS — Z6831 Body mass index (BMI) 31.0-31.9, adult: Secondary | ICD-10-CM | POA: Diagnosis not present

## 2022-01-22 DIAGNOSIS — K219 Gastro-esophageal reflux disease without esophagitis: Secondary | ICD-10-CM | POA: Insufficient documentation

## 2022-01-22 DIAGNOSIS — M1990 Unspecified osteoarthritis, unspecified site: Secondary | ICD-10-CM

## 2022-01-22 DIAGNOSIS — M797 Fibromyalgia: Secondary | ICD-10-CM | POA: Insufficient documentation

## 2022-01-22 DIAGNOSIS — G4733 Obstructive sleep apnea (adult) (pediatric): Secondary | ICD-10-CM | POA: Insufficient documentation

## 2022-01-22 DIAGNOSIS — J189 Pneumonia, unspecified organism: Secondary | ICD-10-CM | POA: Diagnosis not present

## 2022-01-22 DIAGNOSIS — Z01818 Encounter for other preprocedural examination: Secondary | ICD-10-CM

## 2022-01-22 DIAGNOSIS — Z79899 Other long term (current) drug therapy: Secondary | ICD-10-CM | POA: Diagnosis not present

## 2022-01-22 HISTORY — PX: IMPLANTATION OF HYPOGLOSSAL NERVE STIMULATOR: SHX6827

## 2022-01-22 SURGERY — INSERTION, HYPOGLOSSAL NERVE STIMULATOR
Anesthesia: General | Site: Chest | Laterality: Right

## 2022-01-22 MED ORDER — ONDANSETRON HCL 4 MG/2ML IJ SOLN
INTRAMUSCULAR | Status: AC
  Filled 2022-01-22: qty 2

## 2022-01-22 MED ORDER — ACETAMINOPHEN 10 MG/ML IV SOLN
1000.0000 mg | Freq: Once | INTRAVENOUS | Status: AC
  Administered 2022-01-22: 1000 mg via INTRAVENOUS

## 2022-01-22 MED ORDER — LACTATED RINGERS IV SOLN: INTRAVENOUS | Status: DC

## 2022-01-22 MED ORDER — ONDANSETRON HCL 4 MG/2ML IJ SOLN
INTRAMUSCULAR | Status: DC | PRN
  Administered 2022-01-22: 4 mg via INTRAVENOUS

## 2022-01-22 MED ORDER — SUCCINYLCHOLINE CHLORIDE 200 MG/10ML IV SOSY
PREFILLED_SYRINGE | INTRAVENOUS | Status: AC
  Filled 2022-01-22: qty 10

## 2022-01-22 MED ORDER — OXYCODONE HCL 5 MG PO TABS
5.0000 mg | ORAL_TABLET | Freq: Once | ORAL | Status: AC
  Administered 2022-01-22: 5 mg via ORAL

## 2022-01-22 MED ORDER — HYDROCODONE-ACETAMINOPHEN 5-325 MG PO TABS: 1.0000 | ORAL_TABLET | Freq: Four times a day (QID) | ORAL | 0 refills | Status: DC | PRN

## 2022-01-22 MED ORDER — CEFAZOLIN SODIUM-DEXTROSE 2-4 GM/100ML-% IV SOLN
2.0000 g | INTRAVENOUS | Status: AC
  Administered 2022-01-22: 2 g via INTRAVENOUS

## 2022-01-22 MED ORDER — LIDOCAINE-EPINEPHRINE 1 %-1:100000 IJ SOLN
INTRAMUSCULAR | Status: DC | PRN
  Administered 2022-01-22: 7 mL

## 2022-01-22 MED ORDER — 0.9 % SODIUM CHLORIDE (POUR BTL) OPTIME
TOPICAL | Status: DC | PRN
  Administered 2022-01-22: 150 mL

## 2022-01-22 MED ORDER — LIDOCAINE 2% (20 MG/ML) 5 ML SYRINGE
INTRAMUSCULAR | Status: AC
  Filled 2022-01-22: qty 5

## 2022-01-22 MED ORDER — FENTANYL CITRATE (PF) 100 MCG/2ML IJ SOLN
INTRAMUSCULAR | Status: DC | PRN
  Administered 2022-01-22 (×2): 50 ug via INTRAVENOUS

## 2022-01-22 MED ORDER — DEXAMETHASONE SODIUM PHOSPHATE 10 MG/ML IJ SOLN
INTRAMUSCULAR | Status: AC
  Filled 2022-01-22: qty 1

## 2022-01-22 MED ORDER — FENTANYL CITRATE (PF) 100 MCG/2ML IJ SOLN
INTRAMUSCULAR | Status: AC
  Filled 2022-01-22: qty 2

## 2022-01-22 MED ORDER — MEPERIDINE HCL 25 MG/ML IJ SOLN: 6.2500 mg | INTRAMUSCULAR | Status: DC | PRN

## 2022-01-22 MED ORDER — EPHEDRINE 5 MG/ML INJ
INTRAVENOUS | Status: AC
  Filled 2022-01-22: qty 5

## 2022-01-22 MED ORDER — AMISULPRIDE (ANTIEMETIC) 5 MG/2ML IV SOLN: 10.0000 mg | Freq: Once | INTRAVENOUS | Status: DC | PRN

## 2022-01-22 MED ORDER — EPHEDRINE SULFATE (PRESSORS) 50 MG/ML IJ SOLN
INTRAMUSCULAR | Status: DC | PRN
  Administered 2022-01-22: 10 mg via INTRAVENOUS
  Administered 2022-01-22: 5 mg via INTRAVENOUS

## 2022-01-22 MED ORDER — OXYCODONE HCL 5 MG PO TABS
ORAL_TABLET | ORAL | Status: AC
  Filled 2022-01-22: qty 1

## 2022-01-22 MED ORDER — PHENYLEPHRINE HCL-NACL 20-0.9 MG/250ML-% IV SOLN
INTRAVENOUS | Status: DC | PRN
  Administered 2022-01-22: 25 ug/min via INTRAVENOUS

## 2022-01-22 MED ORDER — DEXAMETHASONE SODIUM PHOSPHATE 10 MG/ML IJ SOLN
INTRAMUSCULAR | Status: DC | PRN
  Administered 2022-01-22: 10 mg via INTRAVENOUS

## 2022-01-22 MED ORDER — MIDAZOLAM HCL 5 MG/5ML IJ SOLN
INTRAMUSCULAR | Status: DC | PRN
  Administered 2022-01-22: 2 mg via INTRAVENOUS

## 2022-01-22 MED ORDER — PROPOFOL 10 MG/ML IV BOLUS
INTRAVENOUS | Status: DC | PRN
  Administered 2022-01-22: 120 mg via INTRAVENOUS
  Administered 2022-01-22: 30 mg via INTRAVENOUS

## 2022-01-22 MED ORDER — LIDOCAINE 2% (20 MG/ML) 5 ML SYRINGE
INTRAMUSCULAR | Status: DC | PRN
  Administered 2022-01-22: 80 mg via INTRAVENOUS

## 2022-01-22 MED ORDER — SUCCINYLCHOLINE CHLORIDE 200 MG/10ML IV SOSY
PREFILLED_SYRINGE | INTRAVENOUS | Status: DC | PRN
  Administered 2022-01-22: 80 mg via INTRAVENOUS

## 2022-01-22 MED ORDER — PROMETHAZINE HCL 25 MG/ML IJ SOLN: 6.2500 mg | INTRAMUSCULAR | Status: DC | PRN

## 2022-01-22 MED ORDER — ACETAMINOPHEN 10 MG/ML IV SOLN
INTRAVENOUS | Status: AC
  Filled 2022-01-22: qty 100

## 2022-01-22 MED ORDER — FENTANYL CITRATE (PF) 100 MCG/2ML IJ SOLN
25.0000 ug | INTRAMUSCULAR | Status: DC | PRN
  Administered 2022-01-22 (×2): 50 ug via INTRAVENOUS

## 2022-01-22 MED ORDER — MIDAZOLAM HCL 2 MG/2ML IJ SOLN
INTRAMUSCULAR | Status: AC
  Filled 2022-01-22: qty 2

## 2022-01-22 MED ORDER — CEFAZOLIN SODIUM-DEXTROSE 2-4 GM/100ML-% IV SOLN
INTRAVENOUS | Status: AC
  Filled 2022-01-22: qty 100

## 2022-01-22 SURGICAL SUPPLY — 65 items
ACC NRSTM 4 TRQ WRNCH STRL (MISCELLANEOUS)
ADH SKN CLS APL DERMABOND .7 (GAUZE/BANDAGES/DRESSINGS) ×2
BLADE CLIPPER SURG (BLADE) IMPLANT
BLADE SURG 15 STRL LF DISP TIS (BLADE) ×2 IMPLANT
BLADE SURG 15 STRL SS (BLADE) ×1
CANISTER SUCT 1200ML W/VALVE (MISCELLANEOUS) ×2 IMPLANT
CORD BIPOLAR FORCEPS 12FT (ELECTRODE) ×2 IMPLANT
COVER PROBE W GEL 5X96 (DRAPES) ×2 IMPLANT
DERMABOND ADVANCED .7 DNX12 (GAUZE/BANDAGES/DRESSINGS) ×4 IMPLANT
DRAPE C-ARM 35X43 STRL (DRAPES) ×2 IMPLANT
DRAPE HEAD BAR (DRAPES) IMPLANT
DRAPE INCISE IOBAN 66X45 STRL (DRAPES) ×2 IMPLANT
DRAPE MICROSCOPE WILD 40.5X102 (DRAPES) ×2 IMPLANT
DRAPE UTILITY XL STRL (DRAPES) ×2 IMPLANT
DRSG TEGADERM 2-3/8X2-3/4 SM (GAUZE/BANDAGES/DRESSINGS) ×4 IMPLANT
DRSG TEGADERM 4X4.75 (GAUZE/BANDAGES/DRESSINGS) IMPLANT
ELECT COATED BLADE 2.86 ST (ELECTRODE) ×2 IMPLANT
ELECT EMG 18 NIMS (NEUROSURGERY SUPPLIES) ×1
ELECT REM PT RETURN 9FT ADLT (ELECTROSURGICAL) ×1
ELECTRODE EMG 18 NIMS (NEUROSURGERY SUPPLIES) ×2 IMPLANT
ELECTRODE REM PT RTRN 9FT ADLT (ELECTROSURGICAL) ×2 IMPLANT
FORCEPS BIPOLAR SPETZLER 8 1.0 (NEUROSURGERY SUPPLIES) ×2 IMPLANT
GAUZE 4X4 16PLY ~~LOC~~+RFID DBL (SPONGE) ×2 IMPLANT
GAUZE SPONGE 4X4 12PLY STRL (GAUZE/BANDAGES/DRESSINGS) ×2 IMPLANT
GENERATOR PULSE INSPIRE (Generator) ×1 IMPLANT
GENERATOR PULSE INSPIRE IV (Generator) ×2 IMPLANT
GLOVE BIO SURGEON STRL SZ 6.5 (GLOVE) IMPLANT
GLOVE BIO SURGEON STRL SZ7.5 (GLOVE) ×2 IMPLANT
GLOVE BIOGEL PI IND STRL 7.0 (GLOVE) IMPLANT
GOWN STRL REUS W/ TWL LRG LVL3 (GOWN DISPOSABLE) ×6 IMPLANT
GOWN STRL REUS W/TWL LRG LVL3 (GOWN DISPOSABLE) ×3
IV CATH 18G SAFETY (IV SOLUTION) ×2 IMPLANT
KIT NEURO ACCESSORY W/WRENCH (MISCELLANEOUS) IMPLANT
LEAD SENSING RESP INSPIRE (Lead) ×1 IMPLANT
LEAD SENSING RESP INSPIRE IV (Lead) ×2 IMPLANT
LEAD SLEEP STIM INSPIRE IV/V (Lead) ×2 IMPLANT
LEAD SLEEP STIMULATION INSPIRE (Lead) ×1 IMPLANT
LOOP VESSEL MAXI BLUE (MISCELLANEOUS) ×2 IMPLANT
LOOP VESSEL MINI RED (MISCELLANEOUS) ×2 IMPLANT
MARKER SKIN DUAL TIP RULER LAB (MISCELLANEOUS) ×2 IMPLANT
NDL HYPO 25X1 1.5 SAFETY (NEEDLE) ×2 IMPLANT
NEEDLE HYPO 25X1 1.5 SAFETY (NEEDLE) ×1 IMPLANT
NS IRRIG 1000ML POUR BTL (IV SOLUTION) ×2 IMPLANT
PACK BASIN DAY SURGERY FS (CUSTOM PROCEDURE TRAY) ×2 IMPLANT
PACK ENT DAY SURGERY (CUSTOM PROCEDURE TRAY) ×2 IMPLANT
PASSER CATH 36 CODMAN DISP (NEUROSURGERY SUPPLIES) IMPLANT
PASSER CATH 38CM DISP (INSTRUMENTS) IMPLANT
PENCIL SMOKE EVACUATOR (MISCELLANEOUS) ×2 IMPLANT
PROBE NERVE STIMULATOR (NEUROSURGERY SUPPLIES) ×2 IMPLANT
REMOTE CONTROL SLEEP INSPIRE (MISCELLANEOUS) ×2 IMPLANT
SET WALTER ACTIVATION W/DRAPE (SET/KITS/TRAYS/PACK) ×2 IMPLANT
SLEEVE SCD COMPRESS KNEE MED (STOCKING) ×2 IMPLANT
SPONGE INTESTINAL PEANUT (DISPOSABLE) ×2 IMPLANT
SUT SILK 2 0 SH (SUTURE) ×2 IMPLANT
SUT SILK 3 0 REEL (SUTURE) ×2 IMPLANT
SUT SILK 3 0 SH 30 (SUTURE) ×2 IMPLANT
SUT SILK 3-0 (SUTURE) ×2
SUT SILK 3-0 RB1 30XBRD (SUTURE) ×2
SUT VIC AB 3-0 SH 27 (SUTURE) ×2
SUT VIC AB 3-0 SH 27X BRD (SUTURE) ×2 IMPLANT
SUT VIC AB 4-0 PS2 27 (SUTURE) ×2 IMPLANT
SUTURE SILK 3-0 RB1 30XBRD (SUTURE) ×4 IMPLANT
SYR 10ML LL (SYRINGE) ×2 IMPLANT
SYR BULB EAR ULCER 3OZ GRN STR (SYRINGE) ×2 IMPLANT
TOWEL GREEN STERILE FF (TOWEL DISPOSABLE) ×4 IMPLANT

## 2022-01-22 NOTE — H&P (Signed)
Lindsey [name] is an 66 y.o. female.   Chief Complaint: Sleep apnea HPI: 66 year old female with obstructive sleep apnea who has been unable to tolerate CPAP.  Past Medical History:  Diagnosis Date   Arthritis    Cataract    bil - not ready for surgery   Chronic fatigue    Early onset macular degeneration    Bilateral   Fibromyalgia    GERD (gastroesophageal reflux disease)    Heart murmur    Hemorrhoids    Hyperglycemia 12/24/2013   Hyperlipidemia    Hypersomnia    Hypertension    IBS (irritable bowel syndrome)    Interstitial cystitis    Migraines    OSA (obstructive sleep apnea)    Osteopenia    Pelvic floor dysfunction    PFO (patent foramen ovale)    Pneumonia    Pre-diabetes    Tendonitis    hand   Vitamin D deficiency     Past Surgical History:  Procedure Laterality Date   ANKLE ARTHROSCOPY Right    ANKLE FRACTURE SURGERY Left    BLADDER SURGERY     and hydrodistension   BREAST EXCISIONAL BIOPSY Bilateral    Multiple Benign Lumps removed     BREAST LUMPECTOMY     many, bilateral   CERVICAL DISCECTOMY  2006   fusion and plating   CHOLECYSTECTOMY N/A 09/26/2015   Procedure: LAPAROSCOPIC CHOLECYSTECTOMY WITH INTRAOPERATIVE CHOLANGIOGRAM;  Surgeon: Johnathan Hausen, MD;  Location: [facility] ORS;  Service: General;  Laterality: N/A;   COLONOSCOPY     DRUG INDUCED ENDOSCOPY Bilateral 10/17/2021   Procedure: DRUG INDUCED ENDOSCOPY;  Surgeon: [name], MD;  Location: [facility] OR;  Service: ENT;  Laterality: Bilateral;   FRACTURE SURGERY     ankle fracture x 2   NASAL SINUS SURGERY     multiple, x4   UPPER GASTROINTESTINAL ENDOSCOPY     VAGINAL HYSTERECTOMY  1990   because of the IC partial    Family History  Problem Relation Age of Onset   Fibromyalgia Mother    Thyroid disease Mother    Diabetes Father    Alcohol abuse Father    Heart disease Father    Colon cancer Paternal Uncle    Diabetes Paternal Grandmother    Stroke Paternal Grandmother    Stomach  cancer Paternal Grandfather    Diabetes Paternal Grandfather    Breast cancer Neg Hx    Social History:  reports that she has never smoked. She has been exposed to tobacco smoke. She has never used smokeless tobacco. She reports that she does not drink alcohol and does not use drugs.  Allergies:  Allergies  Allergen Reactions   Buspirone Hcl     REACTION: dystonia   Crestor [Rosuvastatin]     Muscle pain     Simvastatin     REACTION: malaise   Sulfa Antibiotics     nausea    Medications Prior to Admission  Medication Sig Dispense Refill   atenolol (TENORMIN) 100 MG tablet Take 1 tablet (100 mg total) by mouth daily. 90 tablet 3   calcium carbonate (TUMS - DOSED IN MG ELEMENTAL CALCIUM) 500 MG chewable tablet Chew 1-2 tablets by mouth 2 (two) times daily as needed for indigestion or heartburn.     Cholecalciferol (VITAMIN D-3) 125 MCG (5000 UT) TABS Take 5,000 Units by mouth every evening.     diclofenac sodium (VOLTAREN) 1 % GEL Apply 2 to 4 gram to affected joints up  to 4 times daily 400 g 2   docusate sodium (COLACE) 100 MG capsule Take 200 mg by mouth at bedtime.     DULoxetine (CYMBALTA) 60 MG capsule TAKE 1 CAPSULE BY MOUTH EVERY DAY 90 capsule 0   ezetimibe (ZETIA) 10 MG tablet Take 1 tablet (10 mg total) by mouth daily. 90 tablet 3   famotidine (PEPCID) 20 MG tablet Take 20 mg by mouth daily as needed for heartburn or indigestion.     fentaNYL (DURAGESIC - DOSED MCG/HR) 50 MCG/HR Place 50 mcg onto the skin every other day.     hydrOXYzine (ATARAX/VISTARIL) 50 MG tablet Take 50 mg by mouth at bedtime.     ibuprofen (ADVIL) 200 MG tablet Take 400 mg by mouth every 8 (eight) hours as needed (pain.).     lidocaine (XYLOCAINE) 5 % ointment Apply 1 Application topically daily as needed (pain.).     LUTEIN PO Take 40 mg by mouth at bedtime.     magnesium oxide (MAG-OX) 400 (240 Mg) MG tablet Take 400 mg by mouth at bedtime.     polyethylene glycol powder (GLYCOLAX/MIRALAX) 17  GM/SCOOP powder Take 17 g by mouth daily as needed (constipation.).     baclofen (LIORESAL) 20 MG tablet Take 20 mg by mouth daily as needed for muscle spasms.     botulinum toxin Type A (BOTOX) 100 units SOLR injection Inject 200 Units into the muscle every 3 (three) months.     Polyethyl Glycol-Propyl Glycol (LUBRICANT EYE DROPS) 0.4-0.3 % SOLN Place 1-2 drops into both eyes 3 (three) times daily as needed (dry/irritated eyes).      No results found for this or any previous visit (from the past 48 hour(s)). No results found.  Review of Systems  All other systems reviewed and are negative.   Blood pressure (!) 146/89, pulse 66, temperature 98.1 F (36.7 C), temperature source Oral, resp. rate 16, height '5\' 4"'$  (1.626 m), weight 83.7 kg, SpO2 100 %. Physical Exam Constitutional:      Appearance: Normal appearance. She is normal weight.  HENT:     Head: Normocephalic and atraumatic.     Right Ear: External ear normal.     Left Ear: External ear normal.     Nose: Nose normal.     Mouth/Throat:     Mouth: Mucous membranes are moist.     Pharynx: Oropharynx is clear.  Eyes:     Extraocular Movements: Extraocular movements intact.     Conjunctiva/sclera: Conjunctivae normal.     Pupils: Pupils are equal, round, and reactive to light.  Cardiovascular:     Rate and Rhythm: Normal rate.  Pulmonary:     Effort: Pulmonary effort is normal.  Musculoskeletal:     Cervical back: Normal range of motion.  Skin:    General: Skin is warm and dry.  Neurological:     General: No focal deficit present.     Mental Status: She is alert and oriented to person, place, and time.  Psychiatric:        Mood and Affect: Mood normal.        Behavior: Behavior normal.        Thought Content: Thought content normal.        Judgment: Judgment normal.      Assessment/Plan Obstructive sleep apnea and BMI 31.67  To OR for hypoglossal nerve stimulator placement.  [name], MD 01/22/2022, 1:12  PM

## 2022-01-22 NOTE — Anesthesia Procedure Notes (Signed)
Procedure Name: Intubation Date/Time: 01/22/2022 1:40 PM  Performed by: Lavonia Dana, CRNAPre-anesthesia Checklist: Patient identified, Emergency Drugs available, Suction available and Patient being monitored Patient Re-evaluated:Patient Re-evaluated prior to induction Oxygen Delivery Method: Circle system utilized Preoxygenation: Pre-oxygenation with 100% oxygen Induction Type: IV induction Ventilation: Mask ventilation without difficulty Laryngoscope Size: Glidescope and 3 Grade View: Grade I Tube type: Oral Tube size: 7.0 mm Number of attempts: 1 Airway Equipment and Method: Rigid stylet, Video-laryngoscopy and Bite block Placement Confirmation: ETT inserted through vocal cords under direct vision, positive ETCO2 and breath sounds checked- equal and bilateral Secured at: 22 cm Tube secured with: Tape Dental Injury: Teeth and Oropharynx as per pre-operative assessment

## 2022-01-22 NOTE — Transfer of Care (Signed)
Immediate Anesthesia Transfer of Care Note  Patient: Lindsey [name]  Procedure(s) Performed: IMPLANTATION OF HYPOGLOSSAL NERVE STIMULATOR (Right: Chest)  Patient Location: PACU  Anesthesia Type:General  Level of Consciousness: drowsy  Airway & Oxygen Therapy: Patient Spontanous Breathing and Patient connected to face mask oxygen  Post-op Assessment: Report given to RN and Post -op Vital signs reviewed and stable  Post vital signs: Reviewed and stable  Last Vitals:  Vitals Value Taken Time  BP 148/73 01/22/22 1604  Temp    Pulse 85 01/22/22 1605  Resp 11 01/22/22 1605  SpO2 100 % 01/22/22 1605  Vitals shown include unvalidated device data.  Last Pain:  Vitals:   01/22/22 1214  TempSrc: Oral  PainSc: 0-No pain      Patients Stated Pain Goal: 3 (32/00/37 9444)  Complications: No notable events documented.

## 2022-01-22 NOTE — Discharge Instructions (Signed)
No Tylenol before 11:00pm if needed.  Post Anesthesia Home Care Instructions  Activity: Get plenty of rest for the remainder of the day. A responsible individual must stay with you for 24 hours following the procedure.  For the next 24 hours, DO NOT: -Drive a car -Paediatric nurse -Drink alcoholic beverages -Take any medication unless instructed by your physician -Make any legal decisions or sign important papers.  Meals: Start with liquid foods such as gelatin or soup. Progress to regular foods as tolerated. Avoid greasy, spicy, heavy foods. If nausea and/or vomiting occur, drink only clear liquids until the nausea and/or vomiting subsides. Call your physician if vomiting continues.  Special Instructions/Symptoms: Your throat may feel dry or sore from the anesthesia or the breathing tube placed in your throat during surgery. If this causes discomfort, gargle with warm salt water. The discomfort should disappear within 24 hours.

## 2022-01-22 NOTE — Brief Op Note (Signed)
01/22/2022  3:43 PM  PATIENT:  Lindsey [name]  66 y.o. female  PRE-OPERATIVE DIAGNOSIS:  Obstructive sleep apnea  POST-OPERATIVE DIAGNOSIS:  Obstructive sleep apnea  PROCEDURE:  Procedure(s): IMPLANTATION OF HYPOGLOSSAL NERVE STIMULATOR (Right)  SURGEON:  Surgeon(s) and Role:    [name], MD - Primary  PHYSICIAN ASSISTANT:   ASSISTANTS: none   ANESTHESIA:   general  EBL:  15 mL   BLOOD ADMINISTERED:none  DRAINS: none   LOCAL MEDICATIONS USED:  LIDOCAINE   SPECIMEN:  No Specimen  DISPOSITION OF SPECIMEN:  N/A  COUNTS:  YES  TOURNIQUET:  * No tourniquets in log *  DICTATION: .Note written in EPIC  PLAN OF CARE: Discharge to home after PACU  PATIENT DISPOSITION:  PACU - hemodynamically stable.   Delay start of Pharmacological VTE agent (>24hrs) due to surgical blood loss or risk of bleeding: no

## 2022-01-22 NOTE — Anesthesia Postprocedure Evaluation (Signed)
Anesthesia Post Note  Patient: Lindsey [name]  Procedure(s) Performed: IMPLANTATION OF HYPOGLOSSAL NERVE STIMULATOR (Right: Chest)     Patient location during evaluation: PACU Anesthesia Type: General Level of consciousness: sedated and patient cooperative Pain management: pain level controlled Vital Signs Assessment: post-procedure vital signs reviewed and stable Respiratory status: spontaneous breathing Cardiovascular status: stable Anesthetic complications: no Comments: I discussed with Ms Holladay my thoughts on her following up with her PCP soon and reiterated my opinion that she should see a cardiologist regarding her murmur, PFO history and LBBB. She agrees and states she will ASAP.   No notable events documented.  Last Vitals:  Vitals:   01/22/22 1700 01/22/22 1715  BP: (!) 156/67   Pulse: 81 76  Resp: 12   Temp:    SpO2: 96%     Last Pain:  Vitals:   01/22/22 1700  TempSrc:   PainSc: [place]

## 2022-01-22 NOTE — Op Note (Signed)
PREOPERATIVE DIAGNOSIS:  Obstructive sleep apnea.   POSTOPERATIVE DIAGNOSIS:  Obstructive sleep apnea.   PROCEDURE:  Placement of hypoglossal nerve stimulator including placement of sensor lead and testing of stimulator.   SURGEON:  [name], MD   ASSISTANT:  None.   ANESTHESIA:  General endotracheal anesthesia.   COMPLICATIONS:  None.   INDICATIONS:  The patient is a 66 year old female with a history of obstructive sleep apnea who has not been able to tolerate CPAP.  She presents to the operating room for placement of hypoglossal nerve stimulator.   FINDINGS:  Surgical anatomy was unremarkable.  The device was tested intraoperatively and demonstrated excellent stimulation and sensor lead function.   DESCRIPTION OF PROCEDURE:  The patient was identified in the holding room, informed consent having been obtained, including discussion of risks, benefits and alternatives, the patient was brought to the operative suite and put on the operative table in  supine position.  Anesthesia was induced and the patient was intubated by the anesthesia team without difficulty.  The patient was given intravenous antibiotics during the case.  The eyes were taped closed and the bed was turned 180 degrees from  anesthesia and a shoulder roll was placed.  The right neck and right chest incisions were marked with a marking pen after measuring and injected with 1% lidocaine with 1:100,000 epinephrine.  The nerve integrity monitor was placed in the right lateral  tongue and floor of mouth and turned on during the case.  The right neck and chest were prepped and draped in sterile fashion.  The neck incision was made with a 15 blade scalpel and extended through subcutaneous tissue to the platysmal layer using Bovie  electrocautery.  Dissection was then extended down the platysma layer somewhat until it was divided more inferiorly.  This allowed direct dissection down onto the lower portion of the submandibular gland  and ultimately the digastric tendon.  The tendon  was retracted inferiorly with two vessel loops.  Further dissection along the digastric exposed the mylohyoid muscle, which was then retracted anteriorly exposing the hypoglossal nerve.  The fascia over the nerve was then divided using bipolar  electrocautery to control bleeding.  The various branches of the nerve were then identified including the C1 branch and the more distal branches off of the main trunk.  Nerve stimulator was then used to identify which branches would be included as  protrusion branches and excluded as retrusion branches.  The break point between that on the superior surface of the nerve was then identified and the inclusion portion of the nerve was then elevated allowing pocket for cuff placement.  The cuff was then  brought into the field and placed around the inclusion branches and properly positioned.  Saline was then injected under the cuff.  The anchor for this lead was then sutured to the digastric tendon using 3-0 silk suture in 2 positions.  The stimulating lead was  then fully placed in the neck and covered with a damp gauze.  The infraclavicular incision was made using a 15 blade scalpel and extended through the subcutaneous tissues using Bovie electrocautery down to the pectoralis fascia.  A pocket was then  created inferiorly for the generator.  2 stay sutures were then placed at the superior lateral extent of the wound using 2-0 silk suture and these were left in place.  Dissection was then extended through  the pectoralis major muscle overlying the second intercostal space.  Fatty tissue deep to the muscle was  swept exposing the external intercostal muscle.  This muscle was dissected more medially exposing the internal intercostal muscle.  A retractor was then gently placed between those muscles running posteriorly and the sensor lead was then placed into that pocket easily.  The anchor was then sutured with 3-0 silk suture  in 3 positions.  The other anchor was then secured on the pectoralis muscle using 3-0 silk in 2 positions.  The neck incision was then uncovered and the lead unraveled.  The tunneling pocket was started with a hemostat under the platysma muscle and extended with the tunneling down over the clavicle to the generator pocket and the stimulating lead was then pulled into  the generator pocket with a little bit of slack left in the neck.  Both leads were then cleaned off with damp gauze and dried.  Each one was placed into the generator tightening down the screws to 2 clicks on both occasions.  Both leads were tugged and  found to be in good position.  The generator was then positioned into the generator pocket and testing then commenced.  With concern for some retraction of the tongue, the stimulating cuff was re-inspected and removed from the nerve.  Further dissection and stimulation of the nerve demonstrated an additional exclusion branch that was then excluded from the repositioned stimulating cuff.  The device was retested.  After testing demonstrated good stimulation and good sensor lead function, each wound was copiously irrigated with saline.  The chest wound was closed in the deep tissues with 3-0 Vicryl suture in simple interrupted fashion and in the subcutaneous layer using 4-0 Vicryl suture in a simple interrupted fashion.  The neck incision was closed in the platysma layer with 3-0 Vicryl suture in a  simple interrupted fashion and then in the subcutaneous layer using 4-0 Vicryl suture in a simple interrupted fashion.  Both incisions were covered with Dermabond.  The patient was then cleaned off and drapes were removed.  The nerve integrity monitor  was removed.  Each incision was then covered with gauze pressure dressing.  The patient was then returned to anesthesia for wakeup and was extubated in the recovery room in stable condition.

## 2022-01-22 NOTE — Anesthesia Preprocedure Evaluation (Addendum)
Anesthesia Evaluation  Patient identified by MRN, date of birth, ID band Patient awake    Reviewed: Allergy & Precautions, NPO status , Patient's Chart, lab work & pertinent test results  Airway Mallampati: III  TM Distance: >3 FB Neck ROM: Limited  Mouth opening: Limited Mouth Opening  Dental no notable dental hx. (+) Teeth Intact, Dental Advisory Given   Pulmonary sleep apnea , pneumonia,    Pulmonary exam normal breath sounds clear to auscultation       Cardiovascular hypertension, Pt. on medications and Pt. on home beta blockers + Valvular Problems/Murmurs  Rhythm:Regular Rate:Normal + Systolic murmurs    Neuro/Psych  Headaches, negative psych ROS   GI/Hepatic Neg liver ROS, GERD  ,  Endo/Other  negative endocrine ROS  Renal/GU negative Renal ROS     Musculoskeletal  (+) Arthritis , Fibromyalgia -  Abdominal   Peds  Hematology negative hematology ROS (+)   Anesthesia Other Findings   Reproductive/Obstetrics                           Anesthesia Physical  Anesthesia Plan  ASA: 3  Anesthesia Plan: General   Post-op Pain Management: Ofirmev IV (intra-op)*   Induction: Intravenous  PONV Risk Score and Plan: 3 and Treatment may vary due to age or medical condition, Ondansetron, Dexamethasone and Midazolam  Airway Management Planned: Oral ETT and Video Laryngoscope Planned  Additional Equipment:   Intra-op Plan:   Post-operative Plan: Extubation in OR  Informed Consent: I have reviewed the patients History and Physical, chart, labs and discussed the procedure including the risks, benefits and alternatives for the proposed anesthesia with the patient or authorized representative who has indicated his/her understanding and acceptance.     Dental advisory given  Plan Discussed with: CRNA  Anesthesia Plan Comments: (Nebulous history of PFO.  No cardiology workup per patient.  Unable to sleep lying flat due to OSA, and doesn't exert herself due to arthritis and fibromyalgia. Denies LE edema. After long discussion about it, she remembered a "bubble test" was done. She believes at [facility] for a migraine workup "15-20 years ago" and that she had "4 bubbles and the cutoff was 5." Based on this, I feel fairly certain that if a PFO is in existence, it is small. Her murmur therefore is likely MR. However, I did discuss the risks of anesthesia and surgery in the setting of PFO including, but not limited to, stroke and pulmonary complications. I strongly encouraged her to have it looked at in the future given that she does have a murmur. She also had new LBBB on preop EKG prior to sleep endoscopy. This hasn't been worked up either. She denies chest pain or other anginal equivalent. She understands risks and wishes to proceed. )     Anesthesia Quick Evaluation

## 2022-01-23 ENCOUNTER — Encounter (HOSPITAL_BASED_OUTPATIENT_CLINIC_OR_DEPARTMENT_OTHER): Payer: Self-pay | Admitting: Otolaryngology

## 2022-01-23 NOTE — Progress Notes (Signed)
Left message stating courtesy call and if any questions or concerns please call the doctors office.  

## 2022-02-15 ENCOUNTER — Telehealth: Payer: Self-pay

## 2022-02-15 NOTE — Progress Notes (Signed)
    Chronic Care Management Pharmacy Assistant   Name: Lindsey [name]  MRN: [id number] DOB: 13-Jun-1955  Reason for Encounter: CCM (Appointment Reminder)  Medications: Outpatient Encounter Medications as of 02/15/2022  Medication Sig   atenolol (TENORMIN) 100 MG tablet Take 1 tablet (100 mg total) by mouth daily.   baclofen (LIORESAL) 20 MG tablet Take 20 mg by mouth daily as needed for muscle spasms.   botulinum toxin Type A (BOTOX) 100 units SOLR injection Inject 200 Units into the muscle every 3 (three) months.   calcium carbonate (TUMS - DOSED IN MG ELEMENTAL CALCIUM) 500 MG chewable tablet Chew 1-2 tablets by mouth 2 (two) times daily as needed for indigestion or heartburn.   Cholecalciferol (VITAMIN D-3) 125 MCG (5000 UT) TABS Take 5,000 Units by mouth every evening.   diclofenac sodium (VOLTAREN) 1 % GEL Apply 2 to 4 gram to affected joints up to 4 times daily   docusate sodium (COLACE) 100 MG capsule Take 200 mg by mouth at bedtime.   DULoxetine (CYMBALTA) 60 MG capsule TAKE 1 CAPSULE BY MOUTH EVERY DAY   ezetimibe (ZETIA) 10 MG tablet Take 1 tablet (10 mg total) by mouth daily.   famotidine (PEPCID) 20 MG tablet Take 20 mg by mouth daily as needed for heartburn or indigestion.   fentaNYL (DURAGESIC - DOSED MCG/HR) 50 MCG/HR Place 50 mcg onto the skin every other day.   HYDROcodone-acetaminophen (NORCO) 5-325 MG tablet Take 1-2 tablets by mouth every 6 (six) hours as needed for moderate pain.   hydrOXYzine (ATARAX/VISTARIL) 50 MG tablet Take 50 mg by mouth at bedtime.   ibuprofen (ADVIL) 200 MG tablet Take 400 mg by mouth every 8 (eight) hours as needed (pain.).   lidocaine (XYLOCAINE) 5 % ointment Apply 1 Application topically daily as needed (pain.).   LUTEIN PO Take 40 mg by mouth at bedtime.   magnesium oxide (MAG-OX) 400 (240 Mg) MG tablet Take 400 mg by mouth at bedtime.   Polyethyl Glycol-Propyl Glycol (LUBRICANT EYE DROPS) 0.4-0.3 % SOLN Place 1-2 drops into both eyes 3  (three) times daily as needed (dry/irritated eyes).   polyethylene glycol powder (GLYCOLAX/MIRALAX) 17 GM/SCOOP powder Take 17 g by mouth daily as needed (constipation.).   No facility-administered encounter medications on file as of 02/15/2022.   [name] was contacted to remind of upcoming telephone visit with [name] on 02/20/2022 at 9:30. Patient was reminded to have any blood glucose and blood pressure readings available for review at appointment.   Patient confirmed appointment.  Are you having any problems with your medications? No   Do you have any concerns you like to discuss with the pharmacist? No  CCM referral has been placed prior to visit?  No   Star Rating Drugs: Medication:  Last Fill: Day Supply No star rating drugs noted   [name], CPP notified  [name], [place] [facility] Assistant [phone]

## 2022-02-20 ENCOUNTER — Ambulatory Visit: Payer: Medicare Other | Admitting: Pharmacist

## 2022-02-20 ENCOUNTER — Telehealth: Payer: Self-pay | Admitting: Pharmacist

## 2022-02-20 VITALS — BP 127/80

## 2022-02-20 DIAGNOSIS — E7800 Pure hypercholesterolemia, unspecified: Secondary | ICD-10-CM

## 2022-02-20 DIAGNOSIS — M8589 Other specified disorders of bone density and structure, multiple sites: Secondary | ICD-10-CM

## 2022-02-20 DIAGNOSIS — I10 Essential (primary) hypertension: Secondary | ICD-10-CM

## 2022-02-20 DIAGNOSIS — M797 Fibromyalgia: Secondary | ICD-10-CM

## 2022-02-20 DIAGNOSIS — Q2111 Secundum atrial septal defect: Secondary | ICD-10-CM

## 2022-02-20 DIAGNOSIS — G4733 Obstructive sleep apnea (adult) (pediatric): Secondary | ICD-10-CM

## 2022-02-20 NOTE — Progress Notes (Signed)
Chronic Care Management Pharmacy Note  02/20/2022 Name:  Lindsey [name] MRN:  [id number] DOB:  1956-01-05  Summary: CCM F/U visit -Reviewed medications; pt affirms compliance as prescribed -Pt reports anesthesiologist advised her to see a cardiologist for her PFO  Recommendations/Changes made from today's visit: -Defer to PCP for cardiology referral (see phone note)  Plan: -[place] will call patient 3 months for general update -Pharmacist follow up PRN -AWV 03/04/22; PCP annual due 12/2022    Subjective: Lindsey [name] is an 66 y.o. year old female who is a primary patient of Tower, Wynelle Fanny, MD.  The CCM team was consulted for assistance with disease management and care coordination needs.    Engaged with patient by telephone for follow up visit in response to provider referral for pharmacy case management and/or care coordination services.   Consent to Services:  The patient was given information about Chronic Care Management services, agreed to services, and gave verbal consent prior to initiation of services.  Please see initial visit note for detailed documentation.   Patient Care Team: Tower, Wynelle Fanny, MD as PCP - General Lenord Fellers Cleaster Corin, [facility] as Pharmacist (Pharmacist)  Recent office visits: 01/07/22 Dr Glori Bickers OV: annual - referred for colonoscopy. 11/06/20 Dr Glori Bickers OV: f/u - start ezetimibe 10 mg daily - recheck lipids 6 wks. Ordered DEXA. Referred to GI (colonoscopy)  Recent consult visits: 02/18/22 Dr Amalia Hailey (Urology): refilled fentanyl patch  01/30/22 Dr Redmond Baseman (ENT): post-op - need additional hydrocodone  01/18/22 Dr Redmond Baseman (ENT): discussed Inspire device  01/02/22 PA Hazel Sams (Rheumatology): fibromyalgia - scheduled nerve stimulator implant for 01/22/22  11/19/21 Dr Amalia Hailey (Urology):f/u IC. Refilled fentanyl patch.  08/20/21 Alona Bene, MD (Urology) Chronic Fatigue Syndrome Start: Hydroxyzine 50 mg Start: Cymbalta 60 mg Start: Fentanyl 50 mcg FU 3  months 08/09/21 Dr Redmond Baseman [facility]): sleep apnea - arrange drug-induced sleep endoscopy 07/03/21 Bo Merino, MD (Rheumatology) Fibromyalgia FU 6 months 06/14/21 [name], MD (Otolaryngology): Referral to ENT 05/14/21 [name], NP (Urology) Chronic, continuous use of opioids  05/09/21 Nehemiah Settle (Sleep Medicine) Obstructive sleep apnea.   Hospital visits: 01/22/22 - nerve stimulator implantation (Inspire) 10/17/21 - endoscopy   Objective:  Lab Results  Component Value Date   CREATININE 0.89 12/31/2021   BUN 9 12/31/2021   GFR 68.14 12/31/2021   GFRNONAA >60 10/17/2021   GFRAA 98 04/23/2007   NA 137 12/31/2021   K 5.0 12/31/2021   CALCIUM 9.8 12/31/2021   CO2 31 12/31/2021   GLUCOSE 123 (H) 12/31/2021    Lab Results  Component Value Date/Time   HGBA1C 6.2 12/31/2021 11:53 AM   HGBA1C 6.1 05/30/2020 11:21 AM   GFR 68.14 12/31/2021 11:53 AM   GFR 64.04 11/24/2019 12:05 PM    Last diabetic Eye exam: No results found for: "HMDIABEYEEXA"  Last diabetic Foot exam: No results found for: "HMDIABFOOTEX"   Lab Results  Component Value Date   CHOL 200 12/31/2021   HDL 45.50 12/31/2021   LDLCALC 120 (H) 12/31/2021   LDLDIRECT 137.4 11/27/2012   TRIG 171.0 (H) 12/31/2021   CHOLHDL 4 12/31/2021       Latest Ref Rng & Units 12/31/2021   11:53 AM 12/19/2020   11:17 AM 11/24/2019   12:05 PM  Hepatic Function  Total Protein 6.0 - 8.3 g/dL 6.7   6.8   Albumin 3.5 - 5.2 g/dL 4.0   3.9   AST 0 - 37 U/L 14  14  17  ALT 0 - 35 U/L _0 Alk Phosphatase 39 - 117 U/L 75   80   Total Bilirubin 0.2 - 1.2 mg/dL 0.6   0.5     Lab Results  Component Value Date/Time   TSH 3.20 12/31/2021 11:53 AM   TSH 6.98 (H) 05/30/2020 11:21 AM   FREET4 0.64 05/30/2020 11:21 AM       Latest Ref Rng & Units 12/31/2021   11:53 AM 10/17/2021    9:28 AM 11/24/2019   12:05 PM  CBC  WBC 4.0 - 10.5 K/uL 6.2  7.9  6.1   Hemoglobin 12.0 - 15.0 g/dL 14.3  15.3  14.2    Hematocrit 36.0 - 46.0 % 43.0  44.8  42.6   Platelets 150.0 - 400.0 K/uL 219.0  251  206.0     Lab Results  Component Value Date/Time   VD25OH 55.66 12/31/2021 11:53 AM   VD25OH 40.63 11/24/2019 12:05 PM    Clinical ASCVD: No  The 10-year ASCVD risk score (Arnett DK, et al., 2019) is: 7.9%   Values used to calculate the score:     Age: 6 years     Sex: Female     Is Non-Hispanic African American: No     Diabetic: No     Tobacco smoker: No     Systolic Blood Pressure: 272 mmHg     Is BP treated: Yes     HDL Cholesterol: 45.5 mg/dL     Total Cholesterol: 200 mg/dL       02/28/2021   12:06 PM 11/30/2019    2:44 PM 08/25/2019    1:13 PM  Depression screen PHQ 2/9  Decreased Interest 0 3 0  Down, Depressed, Hopeless 0 1 0  PHQ - 2 Score 0 4 0  Altered sleeping  3 0  Tired, decreased energy  3 0  Change in appetite  3 0  Feeling bad or failure about yourself   3 0  Trouble concentrating  3 0  Moving slowly or fidgety/restless  1 0  Suicidal thoughts  0 0  PHQ-9 Score  20 0  Difficult doing work/chores  Extremely dIfficult Not difficult at all     Social History   Tobacco Use  Smoking Status Never   Passive exposure: Past  Smokeless Tobacco Never   BP Readings from Last 3 Encounters:  02/18/22 127/80  01/22/22 (!) 150/71  01/07/22 112/70   Pulse Readings from Last 3 Encounters:  01/22/22 73  01/07/22 60  01/02/22 (!) 59   Wt Readings from Last 3 Encounters:  01/22/22 184 lb 8.4 oz (83.7 kg)  01/07/22 186 lb 6 oz (84.5 kg)  01/02/22 185 lb 6.4 oz (84.1 kg)   BMI Readings from Last 3 Encounters:  01/22/22 31.67 kg/m  01/07/22 33.28 kg/m  01/02/22 31.82 kg/m    Assessment/Interventions: Review of patient past medical history, allergies, medications, health status, including review of consultants reports, laboratory and other test data, was performed as part of comprehensive evaluation and provision of chronic care management services.   SDOH:   (Social Determinants of Health) assessments and interventions performed: Yes SDOH Interventions    Flowsheet Row Chronic Care Management from 02/20/2022 in [place] at [facility] Visit from 01/07/2022 in [place] at [place] Management from 10/26/2020 in [place] at [place] from 08/25/2019 in [place] at [place] from 08/07/2018 in [place] at [place] from 08/05/2017 in [place] at [place] Interventions        Transportation Interventions Intervention Not Indicated -- -- -- -- --  Depression Interventions/Treatment  -- Medication -- PHQ2-9 Score <4 Follow-up Not Indicated, Medication  [takes cymbalta] PHQ2-9 Score <4 Follow-up Not Indicated --  [referral to PCP]  Financial Strain Interventions Intervention Not Indicated -- Intervention Not Indicated -- -- --      SDOH Screenings   Food Insecurity: No Food Insecurity (02/28/2021)  Housing: Low Risk  (02/28/2021)  Transportation Needs: No Transportation Needs (02/20/2022)  Alcohol Screen: Low Risk  (02/28/2021)  Depression (PHQ2-9): Low Risk  (02/28/2021)  Financial Resource Strain: Low Risk  (02/20/2022)  Physical Activity: Inactive (02/28/2021)  Social Connections: Socially Isolated (02/28/2021)  Stress: No Stress Concern Present (02/28/2021)  Tobacco Use: Low Risk  (01/23/2022)    CCM Care Plan  Allergies  Allergen Reactions   Buspirone Hcl     REACTION: dystonia   Crestor [Rosuvastatin]     Muscle pain     Simvastatin     REACTION: malaise   Sulfa Antibiotics     nausea    Medications Reviewed Today     Reviewed by Charlton Haws, [facility] (Pharmacist) on 02/20/22 at [place] List Status: <None>   Medication Order Taking? Sig Documenting Provider Last Dose Status Informant  atenolol (TENORMIN) 100 MG tablet [id number] Yes Take 1 tablet (100 mg total) by mouth daily.  Tower, Wynelle Fanny, MD Taking Active   baclofen (LIORESAL) 20 MG tablet [id number] Yes Take 20 mg by mouth daily as needed for muscle spasms. [provider] Taking Active Self  botulinum toxin Type A (BOTOX) 100 units SOLR injection [id number] Yes Inject 200 Units into the muscle every 3 (three) months. [provider] Taking Active Self  calcium carbonate (TUMS - DOSED IN MG ELEMENTAL CALCIUM) 500 MG chewable tablet [id number] Yes Chew 1-2 tablets by mouth 2 (two) times daily as needed for indigestion or heartburn. [provider] Taking Active Self  Cholecalciferol (VITAMIN D-3) 125 MCG (5000 UT) TABS [id number] Yes Take 5,000 Units by mouth every evening. [provider] Taking Active Self  diclofenac sodium (VOLTAREN) 1 % GEL [id number] Yes Apply 2 to 4 gram to affected joints up to 4 times daily Bo Merino, MD Taking Active Self  docusate sodium (COLACE) 100 MG capsule [id number] Yes Take 200 mg by mouth at bedtime. [provider] Taking Active Self  DULoxetine (CYMBALTA) 60 MG capsule [id number] Yes TAKE 1 CAPSULE BY MOUTH EVERY DAY Ofilia Neas, PA-C Taking Active   ezetimibe (ZETIA) 10 MG tablet [id number] Yes Take 1 tablet (10 mg total) by mouth daily. Tower, Wynelle Fanny, MD Taking Active   famotidine (PEPCID) 20 MG tablet [id number] Yes Take 20 mg by mouth daily as needed for heartburn or indigestion. [provider] Taking Active Self  fentaNYL (DURAGESIC - DOSED MCG/HR) 50 MCG/HR [id number] Yes Place 50 mcg onto the skin every other day. [provider] Taking Active Self  HYDROcodone-acetaminophen (NORCO) 5-325 MG tablet [id number] Yes Take 1-2 tablets by mouth every 6 (six) hours as needed for moderate pain. [name], MD Taking Active   hydrOXYzine (ATARAX/VISTARIL) 50 MG tablet [id number] Yes Take 50 mg by mouth at bedtime. [provider] Taking Active Self  ibuprofen (ADVIL) 200 MG tablet [id number] Yes Take 400 mg by  mouth every 8 (eight) hours as needed (pain.). [provider] Taking Active Self  lidocaine (XYLOCAINE) 5 % ointment [id number] Yes Apply 1 Application topically daily as needed (pain.). [provider] Taking Active Self  LUTEIN PO [id number] Yes Take 40 mg by mouth at bedtime. [provider] Taking Active Self  magnesium oxide (MAG-OX) 400 (240 Mg) MG tablet [id number] Yes Take 400 mg by mouth at bedtime. [provider] Taking Active Self  Polyethyl Glycol-Propyl Glycol (LUBRICANT EYE DROPS) 0.4-0.3 % SOLN [id number] Yes Place 1-2 drops into both eyes 3 (three) times daily as needed (dry/irritated eyes). [provider] Taking Active Self  polyethylene glycol powder (GLYCOLAX/MIRALAX) 17 GM/SCOOP powder [id number] Yes Take 17 g by mouth daily as needed (constipation.). [provider] Taking Active Self            Patient Active Problem List   Diagnosis Date Noted   Colon cancer screening 11/06/2020   Elevated TSH 05/29/2020   Prediabetes 08/09/2018   Estrogen deficiency 01/28/2018   Autoimmune disease ([place]) 06/18/2017   History of recurrent cystitis (IC) 08/16/2016   Pelvic floor dysfunction 08/16/2016   Dysuria 05/24/2016   Macular degeneration 04/09/2016   Osteoarthritis, knee 02/15/2016   S/P laparoscopic cholecystectomy June 2017 09/26/2015   Dermatitis 10/31/2014   Encounter for Medicare annual wellness exam 09/04/2012   HSV (herpes simplex virus) infection 04/08/2012   IBS (irritable bowel syndrome) 10/21/2011   Routine general medical examination at a health care facility 07/02/2011   Osteopenia 05/17/2009   Depressed mood 10/05/2008   GERD 05/05/2008   OSA (obstructive sleep apnea) 06/25/2007   Hyperlipidemia 05/31/2007   Essential hypertension 05/31/2007   Other fatigue 04/23/2007   Vitamin D deficiency 01/22/2007   Migraine headache 01/22/2007   Interstitial cystitis 01/22/2007   MENOPAUSE-RELATED VASOMOTOR  SYMPTOMS 01/22/2007   OSTEOARTHROSIS, GENERALIZED, MULTIPLE SITES 01/22/2007   Fibromyalgia 01/22/2007   PATENT FORAMEN OVALE 01/22/2007    Immunization History  Administered Date(s) Administered   Influenza Inj Mdck Quad Pf 12/16/2017   Influenza Whole 12/31/2006   Influenza, High Dose Seasonal PF 12/26/2021   Influenza,inj,Quad PF,6+ Mos 12/24/2013, 02/20/2016, 10/30/2016, 11/30/2016, 11/26/2018   PFIZER(Purple Top)SARS-COV-2 Vaccination 06/18/2019, 07/13/2019, 03/06/2020   PNEUMOCOCCAL CONJUGATE-20 01/07/2022   Pneumococcal Polysaccharide-23 09/04/2012   Td 04/23/2007   Tdap 06/17/2017    Conditions to be addressed/monitored:  Hypertension, Hyperlipidemia, GERD, and Osteopenia, Fibromyalgia, Constipation  Care Plan : [place]  Updates made by Charlton Haws, [place] since 02/20/2022 12:00 AM     Problem: Hypertension, Hyperlipidemia, GERD, and Osteopenia, Fibromyalgia, Constipation      Long-Range Goal: Disease Management   Start Date: 10/26/2020  Expected End Date: 10/25/2022  This Visit's Progress: On track  Recent Progress: On track  Priority: High  Note:   Current Barriers:  Unable to maintain control of pain  Pharmacist Clinical Goal(s):  Patient will adhere to plan to optimize therapeutic regimen for pain as evidenced by report of adherence to recommended medication management changes through collaboration with [name] and provider.   Interventions: 1:1 collaboration with Tower, Wynelle Fanny, MD regarding development and update of comprehensive plan of care as evidenced by provider attestation and co-signature Inter-disciplinary care team collaboration (see longitudinal plan of care) Comprehensive medication review performed; medication list updated in electronic medical record  Hypertension (BP goal <140/90) -Controlled - per clinic readings (last 127/80 on 02/18/22) -Severe sleep apnea - Inspire device 01/22/22 -Current treatment: Atenolol 100 mg  daily - Appropriate, Effective, Safe, Accessible -Medications previously tried: none reported  -Current exercise habits: none -Atenolol considerations: may  help with migraine prevention -Educated on BP goals and benefits of medications for prevention of heart attack, stroke and kidney damage; -Recommended to continue current medication  Hyperlipidemia: (LDL goal < 100) -Not ideally controlled - LDL 120 (10/23) improved from 150 on ezetimibe (20% reduction); pt has not tolerated statins -Current treatment: Ezetimibe 10 mg daily - Appropriate, Effective, Safe, Accessible -Medications previously tried: simvastatin, rosuvastatin (2x weekly) -Educated on Cholesterol goals; Benefits of statin for ASCVD risk reduction; limiting foods high in cholesterol -Recommend to continue current medication  Fibromyalgia  (Goal: Symptom control) -Stable -Managed by rheumatology -Current treatment: Duloxetine 60 mg daily - Appropriate, Effective, Safe, Accessible Voltaren gel PRN -Appropriate, Effective, Safe, Accessible Lidocaine 5% ointment -Appropriate, Effective, Safe, Accessible -Medications previously tried/failed: none -Recommended to continue current medication; advised to follow up with rheumatology  Migraines (Goal: Reduce migraine frequency/severity) -Controlled- per patient botox works very well -Managed by neurology -Current treatment  Botox Injections q3 months (last 10/11/21) - Appropriate, Effective, Safe, Accessible Atenolol 100 mg daily HS -Appropriate, Effective, Safe, Accessible Baclofen 20 mg PRN (for bladder or migraine) -Appropriate, Effective, Safe, Accessible Ibuprofen 200 mg - 2 tab q8h -Appropriate, Effective, Safe, Accessible -Medications previously tried: none reported  -Recommended to continue current medication  Interstitial Cystitis -Managed by urology - pt is satisfied with current therapy -Current treatment: Hydroxyzine 50 mg - 2 tab HS - Appropriate, Effective,  Safe, Accessible Fentanyl 50 mcg/hour - 1 patch QOD -Appropriate, Effective, Safe, Accessible Baclofen 20 mg PRN -Appropriate, Effective, Safe, Accessible -Recommended to continue current medication  GERD -Controlled - per pt report. She was referred to GI at annual visit -Current treatment  Famotidine 20 mg BID - Appropriate, Effective, Safe, Accessible Tums PRN -Appropriate, Effective, Safe, Accessible -Recommended to continue current medication -Recommend to call GI to scheduled colonoscopy - she is planning to schedule after Jan 1  Osteopenia (Goal prevent fractures) -Controlled - DEXA scan pending for Aug 2023 -Last DEXA Scan: 05/2018   T-Score femoral neck: -1.8  T-Score total hip: -1.3  T-Score lumbar spine: -0.7  10-year probability of major osteoporotic fracture: 8.8%  10-year probability of hip fracture: 1.0% -Patient is not a candidate for pharmacologic treatment -Current treatment  Vitamin D 5000 IU daily - Appropriate, Effective, Safe, Accessible -Medications previously tried: n/a  -Recommend [phone] units of vitamin D daily. -Recommended to continue current medication; keep DEXA scan appt  Constipation (Goal: improve BM) -Not ideally controlled -Current treatment  Docusate 100 mg - 2 tab HS Miralax PRN Senna PRN Mag citrate PRN -Medications previously tried: n/a -Reviewed importance of hydration and fiber for regular bowel movements; also discussed impact of chronic opioids  -Recommended to continue current medication  Health Maintenance -Vaccine gaps: Shingrix -Current therapy:  Lutein 40 mg Magnesium oxide 400 mg   Patient Goals/Self-Care Activities Patient will:  - take medications as prescribed as evidenced by patient report and record review focus on medication adherence by routine -follow up with rheumatology      Medication Assistance: None required.  Patient affirms current coverage meets needs.  Compliance/Adherence/Medication fill  history: Care Gaps: Colonoscopy (due 08/08/20) - referred to GI 01/07/22; pt planning to schedule after Jan1  Star-Rating Drugs: None  Medication Access: Within the past 30 days, how often has patient missed a dose of medication? 0 Is a pillbox or other method used to improve adherence? Yes  Factors that may affect medication adherence? no barriers identified Are meds synced by current pharmacy? No  Are meds delivered by current  pharmacy? No  Does patient experience delays in picking up medications due to transportation concerns? No   [facility] Services Reviewed: Is patient disadvantaged to use [facility]?: No  Current Rx insurance plan: [facility] Name and location of Current pharmacy:  [id number] [address]Phone: [phone]: [phone] [facility] services reviewed with patient today?: No  Patient requests to transfer care to [facility]?: No  Reason patient declined to change pharmacies: Not mentioned at this visit   Care Plan and Follow Up Patient Decision:  Patient agrees to Care Plan and Follow-up.  Plan: The patient has been provided with contact information for the care management team and has been advised to call with any health related questions or concerns.   LCharlene Brooke [name], BCACP Clinical Pharmacist [phone]

## 2022-02-20 NOTE — Telephone Encounter (Signed)
I put the referral in  Let us know if she has any symptoms and call if she does not hear from someone next week

## 2022-02-20 NOTE — Addendum Note (Signed)
Addended by: [name] A on: 02/20/2022 04:48 PM   Modules accepted: Orders

## 2022-02-20 NOTE — Telephone Encounter (Signed)
Patient reported to me that during her Inspire procedure the anesthesiologist told her she needed to see a cardiologist regarding her PFO. Reviewed chart for more info, see below. Patient requests referral to cardiology.  -Per Anesthesiologist 01/22/22: "Comments: I discussed with Lindsey [name] my thoughts on her following up with her PCP soon and reiterated my opinion that she should see a cardiologist regarding her murmur, PFO history and LBBB. She agrees and states she will ASAP. "  -per Dr Lindsey [name] 02/18/22: "As part of the evaluation for surgery, the anesthesiologist determined that she has a patient foramen ovale. We talked about cardiac evaluation and I did suggest getting an echocardiogram would be worthwhile; whether she will need a cardiac catheterization or formal closure of the PFO will be determined based on the echo findings."

## 2022-02-20 NOTE — Patient Instructions (Signed)
Visit Information  Phone number for Pharmacist: [phone]   Goals Addressed   None     Care Plan : [place]  Updates made by Charlton Haws, [place] since 02/20/2022 12:00 AM     Problem: Hypertension, Hyperlipidemia, GERD, and Osteopenia, Fibromyalgia, Constipation      Long-Range Goal: Disease Management   Start Date: 10/26/2020  Expected End Date: 10/25/2022  This Visit's Progress: On track  Recent Progress: On track  Priority: High  Note:   Current Barriers:  Unable to maintain control of pain  Pharmacist Clinical Goal(s):  Patient will adhere to plan to optimize therapeutic regimen for pain as evidenced by report of adherence to recommended medication management changes through collaboration with [name] and provider.   Interventions: 1:1 collaboration with Tower, Wynelle Fanny, MD regarding development and update of comprehensive plan of care as evidenced by provider attestation and co-signature Inter-disciplinary care team collaboration (see longitudinal plan of care) Comprehensive medication review performed; medication list updated in electronic medical record  Hypertension (BP goal <140/90) -Controlled - per clinic readings (last 127/80 on 02/18/22) -Severe sleep apnea - Inspire device 01/22/22 -Current treatment: Atenolol 100 mg daily - Appropriate, Effective, Safe, Accessible -Medications previously tried: none reported  -Current exercise habits: none -Atenolol considerations: may help with migraine prevention -Educated on BP goals and benefits of medications for prevention of heart attack, stroke and kidney damage; -Recommended to continue current medication  Hyperlipidemia: (LDL goal < 100) -Not ideally controlled - LDL 120 (10/23) improved from 150 on ezetimibe (20% reduction); pt has not tolerated statins -Current treatment: Ezetimibe 10 mg daily - Appropriate, Effective, Safe, Accessible -Medications previously tried: simvastatin,  rosuvastatin (2x weekly) -Educated on Cholesterol goals; Benefits of statin for ASCVD risk reduction; limiting foods high in cholesterol -Recommend to continue current medication  Fibromyalgia  (Goal: Symptom control) -Stable -Managed by rheumatology -Current treatment: Duloxetine 60 mg daily - Appropriate, Effective, Safe, Accessible Voltaren gel PRN -Appropriate, Effective, Safe, Accessible Lidocaine 5% ointment -Appropriate, Effective, Safe, Accessible -Medications previously tried/failed: none -Recommended to continue current medication; advised to follow up with rheumatology  Migraines (Goal: Reduce migraine frequency/severity) -Controlled- per patient botox works very well -Managed by neurology -Current treatment  Botox Injections q3 months (last 10/11/21) - Appropriate, Effective, Safe, Accessible Atenolol 100 mg daily HS -Appropriate, Effective, Safe, Accessible Baclofen 20 mg PRN (for bladder or migraine) -Appropriate, Effective, Safe, Accessible Ibuprofen 200 mg - 2 tab q8h -Appropriate, Effective, Safe, Accessible -Medications previously tried: none reported  -Recommended to continue current medication  Interstitial Cystitis -Managed by urology - pt is satisfied with current therapy -Current treatment: Hydroxyzine 50 mg - 2 tab HS - Appropriate, Effective, Safe, Accessible Fentanyl 50 mcg/hour - 1 patch QOD -Appropriate, Effective, Safe, Accessible Baclofen 20 mg PRN -Appropriate, Effective, Safe, Accessible -Recommended to continue current medication  GERD -Controlled - per pt report. She was referred to GI at annual visit -Current treatment  Famotidine 20 mg BID - Appropriate, Effective, Safe, Accessible Tums PRN -Appropriate, Effective, Safe, Accessible -Recommended to continue current medication -Recommend to call GI to scheduled colonoscopy - she is planning to schedule after Jan 1  Osteopenia (Goal prevent fractures) -Controlled - DEXA scan pending for Aug  2023 -Last DEXA Scan: 05/2018   T-Score femoral neck: -1.8  T-Score total hip: -1.3  T-Score lumbar spine: -0.7  10-year probability of major osteoporotic fracture: 8.8%  10-year probability of hip fracture: 1.0% -Patient is not a candidate for pharmacologic treatment -Current treatment  Vitamin  D 5000 IU daily - Appropriate, Effective, Safe, Accessible -Medications previously tried: n/a  -Recommend [phone] units of vitamin D daily. -Recommended to continue current medication; keep DEXA scan appt  Constipation (Goal: improve BM) -Not ideally controlled -Current treatment  Docusate 100 mg - 2 tab HS Miralax PRN Senna PRN Mag citrate PRN -Medications previously tried: n/a -Reviewed importance of hydration and fiber for regular bowel movements; also discussed impact of chronic opioids  -Recommended to continue current medication  Health Maintenance -Vaccine gaps: Shingrix -Current therapy:  Lutein 40 mg Magnesium oxide 400 mg   Patient Goals/Self-Care Activities Patient will:  - take medications as prescribed as evidenced by patient report and record review focus on medication adherence by routine -follow up with rheumatology       Patient verbalizes understanding of instructions and care plan provided today and agrees to view in MyChart. Active MyChart status and patient understanding of how to access instructions and care plan via MyChart confirmed with patient.    Telephone follow up appointment with pharmacy team member scheduled for: PRN  [name], [name], BCACP Clinical Pharmacist [facility] [phone]

## 2022-03-04 ENCOUNTER — Ambulatory Visit (INDEPENDENT_AMBULATORY_CARE_PROVIDER_SITE_OTHER): Payer: Medicare Other | Admitting: *Deleted

## 2022-03-04 DIAGNOSIS — Z0000 Encounter for general adult medical examination without abnormal findings: Secondary | ICD-10-CM | POA: Diagnosis not present

## 2022-03-04 NOTE — Progress Notes (Signed)
Subjective:   Lindsey [name] is a 66 y.o. female who presents for Medicare Annual (Subsequent) preventive examination.  I connected with  Lindsey [name] on 03/04/22 by a telephone enabled telemedicine application and verified that I am speaking with the correct person using two identifiers.   I discussed the limitations of evaluation and management by telemedicine. The patient expressed understanding and agreed to proceed.  Patient location: home  Provider location: Tele-health-home   Review of Systems     Cardiac Risk Factors include: advanced age (>25mn, >>11women);hypertension;obesity (BMI >30kg/m2)     Objective:    Today's Vitals   03/04/22 1143  PainSc: 3    There is no height or weight on file to calculate BMI.     03/04/2022   11:47 AM 01/22/2022   12:12 PM 01/11/2022    9:12 AM 10/17/2021    9:31 AM 02/28/2021   12:02 PM 08/25/2019    1:11 PM 08/07/2018    4:00 PM  Advanced Directives  Does Patient Have a Medical Advance Directive? No No No No No No No  Would patient like information on creating a medical advance directive? No - Patient declined No - Patient declined No - Patient declined No - Patient declined Yes (MAU/Ambulatory/Procedural Areas - Information given) No - Patient declined No - Patient declined    Current Medications (verified) Outpatient Encounter Medications as of 03/04/2022  Medication Sig   atenolol (TENORMIN) 100 MG tablet Take 1 tablet (100 mg total) by mouth daily.   baclofen (LIORESAL) 20 MG tablet Take 20 mg by mouth daily as needed for muscle spasms.   botulinum toxin Type A (BOTOX) 100 units SOLR injection Inject 200 Units into the muscle every 3 (three) months.   calcium carbonate (TUMS - DOSED IN MG ELEMENTAL CALCIUM) 500 MG chewable tablet Chew 1-2 tablets by mouth 2 (two) times daily as needed for indigestion or heartburn.   Cholecalciferol (VITAMIN D-3) 125 MCG (5000 UT) TABS Take 5,000 Units by mouth every evening.   diclofenac  sodium (VOLTAREN) 1 % GEL Apply 2 to 4 gram to affected joints up to 4 times daily   docusate sodium (COLACE) 100 MG capsule Take 200 mg by mouth at bedtime.   DULoxetine (CYMBALTA) 60 MG capsule TAKE 1 CAPSULE BY MOUTH EVERY DAY   ezetimibe (ZETIA) 10 MG tablet Take 1 tablet (10 mg total) by mouth daily.   famotidine (PEPCID) 20 MG tablet Take 20 mg by mouth daily as needed for heartburn or indigestion.   fentaNYL (DURAGESIC - DOSED MCG/HR) 50 MCG/HR Place 50 mcg onto the skin every other day.   HYDROcodone-acetaminophen (NORCO) 5-325 MG tablet Take 1-2 tablets by mouth every 6 (six) hours as needed for moderate pain.   hydrOXYzine (ATARAX/VISTARIL) 50 MG tablet Take 50 mg by mouth at bedtime.   ibuprofen (ADVIL) 200 MG tablet Take 400 mg by mouth every 8 (eight) hours as needed (pain.).   lidocaine (XYLOCAINE) 5 % ointment Apply 1 Application topically daily as needed (pain.).   LUTEIN PO Take 40 mg by mouth at bedtime.   magnesium oxide (MAG-OX) 400 (240 Mg) MG tablet Take 400 mg by mouth at bedtime.   Polyethyl Glycol-Propyl Glycol (LUBRICANT EYE DROPS) 0.4-0.3 % SOLN Place 1-2 drops into both eyes 3 (three) times daily as needed (dry/irritated eyes).   polyethylene glycol powder (GLYCOLAX/MIRALAX) 17 GM/SCOOP powder Take 17 g by mouth daily as needed (constipation.).   No facility-administered encounter medications on file as  of 03/04/2022.    Allergies (verified) Buspirone hcl, Crestor [rosuvastatin], Simvastatin, and Sulfa antibiotics   History: Past Medical History:  Diagnosis Date   Arthritis    Cataract    bil - not ready for surgery   Chronic fatigue    Early onset macular degeneration    Bilateral   Fibromyalgia    GERD (gastroesophageal reflux disease)    Heart murmur    Hemorrhoids    Hyperglycemia 12/24/2013   Hyperlipidemia    Hypersomnia    Hypertension    IBS (irritable bowel syndrome)    Interstitial cystitis    Migraines    OSA (obstructive sleep apnea)     Osteopenia    Pelvic floor dysfunction    PFO (patent foramen ovale)    Pneumonia    Pre-diabetes    Tendonitis    hand   Vitamin D deficiency    Past Surgical History:  Procedure Laterality Date   ANKLE ARTHROSCOPY Right    ANKLE FRACTURE SURGERY Left    BLADDER SURGERY     and hydrodistension   BREAST EXCISIONAL BIOPSY Bilateral    Multiple Benign Lumps removed     BREAST LUMPECTOMY     many, bilateral   CERVICAL DISCECTOMY  2006   fusion and plating   CHOLECYSTECTOMY N/A 09/26/2015   Procedure: LAPAROSCOPIC CHOLECYSTECTOMY WITH INTRAOPERATIVE CHOLANGIOGRAM;  Surgeon: Johnathan Hausen, MD;  Location: [facility] ORS;  Service: General;  Laterality: N/A;   COLONOSCOPY     DRUG INDUCED ENDOSCOPY Bilateral 10/17/2021   Procedure: DRUG INDUCED ENDOSCOPY;  Surgeon: [name], MD;  Location: [facility];  Service: ENT;  Laterality: Bilateral;   FRACTURE SURGERY     ankle fracture x 2   IMPLANTATION OF HYPOGLOSSAL NERVE STIMULATOR Right 01/22/2022   Procedure: IMPLANTATION OF HYPOGLOSSAL NERVE STIMULATOR;  Surgeon: [name], MD;  Location: [place];  Service: ENT;  Laterality: Right;   NASAL SINUS SURGERY     multiple, x4   UPPER GASTROINTESTINAL ENDOSCOPY     VAGINAL HYSTERECTOMY  1990   because of the IC partial   Family History  Problem Relation Age of Onset   Fibromyalgia Mother    Thyroid disease Mother    Diabetes Father    Alcohol abuse Father    Heart disease Father    Colon cancer Paternal Uncle    Diabetes Paternal Grandmother    Stroke Paternal Grandmother    Stomach cancer Paternal Grandfather    Diabetes Paternal Grandfather    Breast cancer Neg Hx    Social History   Socioeconomic History   Marital status: Married    Spouse name: Not on file   Number of children: 0   Years of education: Not on file   Highest education level: Not on file  Occupational History   Occupation: Disabled from [place]: DISABLED  Tobacco Use   Smoking  status: Never    Passive exposure: Past   Smokeless tobacco: Never  Vaping Use   Vaping Use: Never used  Substance and Sexual Activity   Alcohol use: No    Alcohol/week: 0.0 standard drinks of alcohol   Drug use: No   Sexual activity: Yes  Other Topics Concern   Not on file  Social History Narrative   Not on file   Social Determinants of Health   Financial Resource Strain: Low Risk  (03/04/2022)   Overall Financial Resource Strain (CARDIA)    Difficulty of Paying Living Expenses: Not  hard at all  Food Insecurity: No Food Insecurity (03/04/2022)   Hunger Vital Sign    Worried About Running Out of Food in the Last Year: Never true    Ran Out of Food in the Last Year: Never true  Transportation Needs: No Transportation Needs (03/04/2022)   PRAPARE - Hydrologist (Medical): No    Lack of Transportation (Non-Medical): No  Physical Activity: Inactive (03/04/2022)   Exercise Vital Sign    Days of Exercise per Week: 0 days    Minutes of Exercise per Session: 0 min  Stress: Stress Concern Present (03/04/2022)   [place]    Feeling of Stress : To some extent  Social Connections: Socially Isolated (03/04/2022)   Social Connection and Isolation Panel [NHANES]    Frequency of Communication with Friends and Family: Never    Frequency of Social Gatherings with Friends and Family: Never    Attends Religious Services: Never    Printmaker: No    Attends Music therapist: Never    Marital Status: Married    Tobacco Counseling Counseling given: Not Answered   Clinical Intake:  Pre-visit preparation completed: Yes  Pain : 0-10 Pain Score: 3  Pain Location: Jaw Pain Descriptors / Indicators: Aching, Burning, Discomfort, Constant Pain Onset: More than a month ago Pain Frequency: Intermittent Pain Relieving Factors: ibuprofen  Pain Relieving Factors:  ibuprofen  Diabetes: No  How often do you need to have someone help you when you read instructions, pamphlets, or other written materials from your doctor or pharmacy?: 1 - Never  Diabetic?  no  Interpreter Needed?: No  Information entered by :: Leroy Kennedy LPN   Activities of Daily Living    03/04/2022   11:48 AM 01/22/2022   12:17 PM  In your present state of health, do you have any difficulty performing the following activities:  Hearing? 0 0  Vision? 0 0  Difficulty concentrating or making decisions? 0 0  Walking or climbing stairs? 1 0  Dressing or bathing? 0 0  Doing errands, shopping? 1   Preparing Food and eating ? N   Using the Toilet? N   In the past six months, have you accidently leaked urine? Y   Do you have problems with loss of bowel control? N   Managing your Medications? N   Managing your Finances? N   Housekeeping or managing your Housekeeping? Y     Patient Care Team: Tower, Wynelle Fanny, MD as PCP - General Lenord Fellers Cleaster Cor[place], [facility] as Pharmacist (Pharmacist)  Indicate any recent Medical Services you may have received from other than [facility] providers in the past year (date may be approximate).     Assessment:   This is a routine wellness examination for Lindsey [name].  Hearing/Vision screen Hearing Screening - Comments:: No trouble hearing Vision Screening - Comments:: Up to date [facility] Macular Degeration  Dietary issues and exercise activities discussed: Current Exercise Habits: The patient does not participate in regular exercise at present   Goals Addressed             This Visit's Progress    Patient Stated   On track    Starting 08/07/2018, I will continue to take medications as prescribed.      Patient Stated   On track    08/25/2019, I will maintain and continue medications as prescribed.      Patient  Stated   Not on track    Would like to maintain current routine     Patient Stated       No goals       Depression Screen     03/04/2022   11:53 AM 02/28/2021   12:06 PM 11/30/2019    2:44 PM 08/25/2019    1:13 PM 08/07/2018    3:59 PM 08/05/2017    1:25 PM 06/17/2017    2:46 PM  PHQ 2/9 Scores  PHQ - 2 Score 2 0 4 0 0 1 3  PHQ- 9 Score 10  20 0 0 12 15    Fall Risk    03/04/2022   11:45 AM 02/28/2021   12:04 PM 11/30/2019    2:44 PM 08/25/2019    1:11 PM 08/07/2018    3:59 PM  [place] in the past year? 0 0 1 1 0  Comment    fell getting out of chair   Number falls in past yr: 0 0 0 0   Injury with Fall? 0 0 1 0   Risk for fall due to :  No Fall Risks History of fall(s) Medication side effect   Follow up Falls evaluation completed;Education provided;Falls prevention discussed Falls prevention discussed Falls evaluation completed Falls evaluation completed;Falls prevention discussed     FALL RISK PREVENTION PERTAINING TO THE HOME:  Any stairs in or around the home? Yes  If so, are there any without handrails? No  Home free of loose throw rugs in walkways, pet beds, electrical cords, etc? Yes  Adequate lighting in your home to reduce risk of falls? Yes   ASSISTIVE DEVICES UTILIZED TO PREVENT FALLS:  Life alert? No  Use of a cane, walker or w/c? No  Grab bars in the bathroom? Yes  Shower chair or bench in shower? Yes  Elevated toilet seat or a handicapped toilet? Yes   TIMED UP AND GO:  Was the test performed? No .   Cognitive Function:    08/25/2019    1:17 PM 08/07/2018    3:59 PM 08/05/2017    1:27 PM 04/23/2016    1:30 PM  MMSE - Mini Mental State Exam  Orientation to time '5 5 5 5  '$ Orientation to Place '5 5 5 5  '$ Registration '3 3 3 3  '$ Attention/ Calculation 5 0 0 0  Recall '3 3 3 3  '$ Language- name 2 objects  0 0 0  Language- repeat '1 1 1 1  '$ Language- follow 3 step command  0 3 3  Language- read & follow direction  0 0 0  Write a sentence  0 0 0  Copy design  0 0 0  Total score  '17 20 20        '$ 03/04/2022   11:50 AM 02/28/2021   12:07 PM  6CIT Screen  What Year? 0 points 0  points  What month? 0 points 0 points  What time? 0 points 0 points  Count back from 20 0 points 0 points  Months in reverse 0 points 0 points  Repeat phrase 0 points 0 points  Total Score 0 points 0 points    Immunizations Immunization History  Administered Date(s) Administered   Influenza Inj Mdck Quad Pf 12/16/2017   Influenza Whole 12/31/2006   Influenza, High Dose Seasonal PF 12/26/2021   Influenza,inj,Quad PF,6+ Mos 12/24/2013, 02/20/2016, 10/30/2016, 11/30/2016, 11/26/2018   PFIZER(Purple Top)SARS-COV-2 Vaccination 06/18/2019, 07/13/2019, 03/06/2020   PNEUMOCOCCAL CONJUGATE-20 01/07/2022  Pneumococcal Polysaccharide-23 09/04/2012   Td 04/23/2007   Tdap 06/17/2017    TDAP status: Up to date  Flu Vaccine status: Up to date  Pneumococcal vaccine status: Up to date  Covid-19 vaccine status: Information provided on how to obtain vaccines.   Qualifies for Shingles Vaccine? Yes   Zostavax completed No   Shingrix Completed?: No.    Education has been provided regarding the importance of this vaccine. Patient has been advised to call insurance company to determine out of pocket expense if they have not yet received this vaccine. Advised may also receive vaccine at local pharmacy or Health Dept. Verbalized acceptance and understanding.  Screening Tests Health Maintenance  Topic Date Due   COLONOSCOPY (Pts 45-68yr Insurance coverage will need to be confirmed)  08/08/2020   COVID-19 Vaccine (4 - 2023-24 season) 03/20/2022 (Originally 11/30/2021)   Zoster Vaccines- Shingrix (1 of 2) 06/03/2022 (Originally 10/21/2006)   Medicare Annual Wellness (AWV)  03/05/2023   MAMMOGRAM  12/25/2023   DEXA SCAN  12/25/2023   DTaP/Tdap/Td (3 - Td or Tdap) 06/18/2027   Pneumonia Vaccine 65 Years old  Completed   INFLUENZA VACCINE  Completed   Hepatitis C Screening  Completed   HIV Screening  Completed   HPV VACCINES  Aged Out    Health Maintenance  Health Maintenance Due  Topic Date  Due   COLONOSCOPY (Pts 45-465yrInsurance coverage will need to be confirmed)  08/08/2020    Colonoscopy referral placed  Mammogram status: Completed  . Repeat every year  Bone Density status: Completed 2022. Results reflect: Bone density results: OSTEOPOROSIS. Repeat every 2 years.  Lung Cancer Screening: (Low Dose CT Chest recommended if Age 65-80ears, 30 pack-year currently smoking OR have quit w/in 15years.) does not qualify.   Lung Cancer Screening Referral:   Additional Screening:  Hepatitis C Screening: does not qualify; Completed 2018  Vision Screening: Recommended annual ophthalmology exams for early detection of glaucoma and other disorders of the eye. Is the patient up to date with their annual eye exam?  Yes  Who is the provider or what is the name of the office in which the patient attends annual eye exams? [place] is not established with a provider, would they like to be referred to a provider to establish care? No .   Dental Screening: Recommended annual dental exams for proper oral hygiene  Community Resource Referral / Chronic Care Management: CRR required this visit?  No   CCM required this visit?  No      Plan:     I have personally reviewed and noted the following in the patient's chart:   Medical and social history Use of alcohol, tobacco or illicit drugs  Current medications and supplements including opioid prescriptions. Patient is not currently taking opioid prescriptions. Functional ability and status Nutritional status Physical activity Advanced directives List of other physicians Hospitalizations, surgeries, and ER visits in previous 12 months Vitals Screenings to include cognitive, depression, and falls Referrals and appointments  In addition, I have reviewed and discussed with patient certain preventive protocols, quality metrics, and best practice recommendations. A written personalized care plan for preventive services as  well as general preventive health recommendations were provided to patient.     JuLeroy KennedyLPN   1227/07/1698 Nurse Notes:

## 2022-03-04 NOTE — Patient Instructions (Signed)
Lindsey [name] , Thank you for taking time to come for your Medicare Wellness Visit. I appreciate your ongoing commitment to your health goals. Please review the following plan we discussed and let me know if I can assist you in the future.   These are the goals we discussed:  Goals      Patient Stated     Starting 08/07/2018, I will continue to take medications as prescribed.      Patient Stated     08/25/2019, I will maintain and continue medications as prescribed.      Patient Stated     Would like to maintain current routine     Patient Stated     No goals        This is a list of the screening recommended for you and due dates:  Health Maintenance  Topic Date Due   Colon Cancer Screening  08/08/2020   COVID-19 Vaccine (4 - 2023-24 season) 03/20/2022*   Zoster (Shingles) Vaccine (1 of 2) 06/03/2022*   Medicare Annual Wellness Visit  03/05/2023   Mammogram  12/25/2023   DEXA scan (bone density measurement)  12/25/2023   DTaP/Tdap/Td vaccine (3 - Td or Tdap) 06/18/2027   Pneumonia Vaccine  Completed   Flu Shot  Completed   Hepatitis C Screening: USPSTF Recommendation to screen - Ages 18-79 yo.  Completed   HIV Screening  Completed   HPV Vaccine  Aged Out  *Topic was postponed. The date shown is not the original due date.    Advanced directives: Education provided     Preventive Care 35 Years and Older, Female Preventive care refers to lifestyle choices and visits with your health care provider that can promote health and wellness. What does preventive care include? A yearly physical exam. This is also called an annual well check. Dental exams once or twice a year. Routine eye exams. Ask your health care provider how often you should have your eyes checked. Personal lifestyle choices, including: Daily care of your teeth and gums. Regular physical activity. Eating a healthy diet. Avoiding tobacco and drug use. Limiting alcohol use. Practicing safe sex. Taking  low-dose aspirin every day. Taking vitamin and mineral supplements as recommended by your health care provider. What happens during an annual well check? The services and screenings done by your health care provider during your annual well check will depend on your age, overall health, lifestyle risk factors, and family history of disease. Counseling  Your health care provider may ask you questions about your: Alcohol use. Tobacco use. Drug use. Emotional well-being. Home and relationship well-being. Sexual activity. Eating habits. History of falls. Memory and ability to understand (cognition). Work and work Statistician. Reproductive health. Screening  You may have the following tests or measurements: Height, weight, and BMI. Blood pressure. Lipid and cholesterol levels. These may be checked every 5 years, or more frequently if you are over 22 years old. Skin check. Lung cancer screening. You may have this screening every year starting at age 77 if you have a 30-pack-year history of smoking and currently smoke or have quit within the past 15 years. Fecal occult blood test (FOBT) of the stool. You may have this test every year starting at age 49. Flexible sigmoidoscopy or colonoscopy. You may have a sigmoidoscopy every 5 years or a colonoscopy every 10 years starting at age 65. Hepatitis C blood test. Hepatitis B blood test. Sexually transmitted disease (STD) testing. Diabetes screening. This is done by checking your blood sugar (  glucose) after you have not eaten for a while (fasting). You may have this done every 1-3 years. Bone density scan. This is done to screen for osteoporosis. You may have this done starting at age 56. Mammogram. This may be done every 1-2 years. Talk to your health care provider about how often you should have regular mammograms. Talk with your health care provider about your test results, treatment options, and if necessary, the need for more tests. Vaccines   Your health care provider may recommend certain vaccines, such as: Influenza vaccine. This is recommended every year. Tetanus, diphtheria, and acellular pertussis (Tdap, Td) vaccine. You may need a Td booster every 10 years. Zoster vaccine. You may need this after age 9. Pneumococcal 13-valent conjugate (PCV13) vaccine. One dose is recommended after age 19. Pneumococcal polysaccharide (PPSV23) vaccine. One dose is recommended after age 35. Talk to your health care provider about which screenings and vaccines you need and how often you need them. This information is not intended to replace advice given to you by your health care provider. Make sure you discuss any questions you have with your health care provider. Document Released: 04/14/2015 Document Revised: 12/06/2015 Document Reviewed: 01/17/2015 Elsevier Interactive Patient Education  2017 [place] Prevention in the Home Falls can cause injuries. They can happen to people of all ages. There are many things you can do to make your home safe and to help prevent falls. What can I do on the outside of my home? Regularly fix the edges of walkways and driveways and fix any cracks. Remove anything that might make you trip as you walk through a door, such as a raised step or threshold. Trim any bushes or trees on the path to your home. Use bright outdoor lighting. Clear any walking paths of anything that might make someone trip, such as rocks or tools. Regularly check to see if handrails are loose or broken. Make sure that both sides of any steps have handrails. Any raised decks and porches should have guardrails on the edges. Have any leaves, snow, or ice cleared regularly. Use sand or salt on walking paths during winter. Clean up any spills in your garage right away. This includes oil or grease spills. What can I do in the bathroom? Use night lights. Install grab bars by the toilet and in the tub and shower. Do not use towel  bars as grab bars. Use non-skid mats or decals in the tub or shower. If you need to sit down in the shower, use a plastic, non-slip stool. Keep the floor dry. Clean up any water that spills on the floor as soon as it happens. Remove soap buildup in the tub or shower regularly. Attach bath mats securely with double-sided non-slip rug tape. Do not have throw rugs and other things on the floor that can make you trip. What can I do in the bedroom? Use night lights. Make sure that you have a light by your bed that is easy to reach. Do not use any sheets or blankets that are too big for your bed. They should not hang down onto the floor. Have a firm chair that has side arms. You can use this for support while you get dressed. Do not have throw rugs and other things on the floor that can make you trip. What can I do in the kitchen? Clean up any spills right away. Avoid walking on wet floors. Keep items that you use a lot in easy-to-reach places.  If you need to reach something above you, use a strong step stool that has a grab bar. Keep electrical cords out of the way. Do not use floor polish or wax that makes floors slippery. If you must use wax, use non-skid floor wax. Do not have throw rugs and other things on the floor that can make you trip. What can I do with my stairs? Do not leave any items on the stairs. Make sure that there are handrails on both sides of the stairs and use them. Fix handrails that are broken or loose. Make sure that handrails are as long as the stairways. Check any carpeting to make sure that it is firmly attached to the stairs. Fix any carpet that is loose or worn. Avoid having throw rugs at the top or bottom of the stairs. If you do have throw rugs, attach them to the floor with carpet tape. Make sure that you have a light switch at the top of the stairs and the bottom of the stairs. If you do not have them, ask someone to add them for you. What else can I do to help  prevent falls? Wear shoes that: Do not have high heels. Have rubber bottoms. Are comfortable and fit you well. Are closed at the toe. Do not wear sandals. If you use a stepladder: Make sure that it is fully opened. Do not climb a closed stepladder. Make sure that both sides of the stepladder are locked into place. Ask someone to hold it for you, if possible. Clearly mark and make sure that you can see: Any grab bars or handrails. First and last steps. Where the edge of each step is. Use tools that help you move around (mobility aids) if they are needed. These include: Canes. Walkers. Scooters. Crutches. Turn on the lights when you go into a dark area. Replace any light bulbs as soon as they burn out. Set up your furniture so you have a clear path. Avoid moving your furniture around. If any of your floors are uneven, fix them. If there are any pets around you, be aware of where they are. Review your medicines with your doctor. Some medicines can make you feel dizzy. This can increase your chance of falling. Ask your doctor what other things that you can do to help prevent falls. This information is not intended to replace advice given to you by your health care provider. Make sure you discuss any questions you have with your health care provider. Document Released: 01/12/2009 Document Revised: 08/24/2015 Document Reviewed: 04/22/2014 Elsevier Interactive Patient Education  2017 [employer].

## 2022-03-26 ENCOUNTER — Other Ambulatory Visit: Payer: Self-pay | Admitting: Physician Assistant

## 2022-03-27 NOTE — Telephone Encounter (Signed)
Next Visit: 07/09/2022  Last Visit: 01/02/2022  Last Fill: 12/18/2021  Dx: History of depression   Current Dose per office note on 01/02/2022: Cymbalta 60 mg 1 capsule by mouth daily.   Okay to refill Cymbalta?

## 2022-04-15 ENCOUNTER — Encounter: Payer: Self-pay | Admitting: Cardiology

## 2022-04-15 ENCOUNTER — Other Ambulatory Visit
Admission: RE | Admit: 2022-04-15 | Discharge: 2022-04-15 | Disposition: A | Discharge status: Home / Self Care | Payer: Medicare Other | Source: Ambulatory Visit | Attending: Cardiology | Admitting: Cardiology

## 2022-04-15 ENCOUNTER — Ambulatory Visit: Payer: Medicare Other | Attending: Cardiology | Admitting: Cardiology

## 2022-04-15 VITALS — BP 146/99 | HR 65 | Ht 64.0 in | Wt 183.4 lb

## 2022-04-15 DIAGNOSIS — R072 Precordial pain: Secondary | ICD-10-CM | POA: Insufficient documentation

## 2022-04-15 DIAGNOSIS — Q2112 Patent foramen ovale: Secondary | ICD-10-CM | POA: Insufficient documentation

## 2022-04-15 DIAGNOSIS — E782 Mixed hyperlipidemia: Secondary | ICD-10-CM | POA: Insufficient documentation

## 2022-04-15 DIAGNOSIS — R011 Cardiac murmur, unspecified: Secondary | ICD-10-CM | POA: Insufficient documentation

## 2022-04-15 DIAGNOSIS — I10 Essential (primary) hypertension: Secondary | ICD-10-CM | POA: Diagnosis not present

## 2022-04-15 LAB — BASIC METABOLIC PANEL
Anion gap: 8 (ref 5–15)
BUN: 13 mg/dL (ref 8–23)
CO2: 25 mmol/L (ref 22–32)
Calcium: 9.5 mg/dL (ref 8.9–10.3)
Chloride: 102 mmol/L (ref 98–111)
Creatinine, Ser: 0.83 mg/dL (ref 0.44–1.00)
GFR, Estimated: >60 mL/min (ref >60)
Glucose, Bld: 103 mg/dL — ABNORMAL HIGH (ref 70–99)
Potassium: 4.4 mmol/L (ref 3.5–5.1)
Sodium: 135 mmol/L (ref 135–145)

## 2022-04-15 MED ORDER — METOPROLOL TARTRATE 100 MG PO TABS: ORAL_TABLET | ORAL | 0 refills | Status: DC

## 2022-04-15 NOTE — Progress Notes (Signed)
Cardiology Office Note:    Date:  04/15/2022   ID:  [name], DOB [birth year], MRN [id number]  PCP:  Abner Greenspan, MD   [place] Providers Cardiologist:  Kate Sable, MD     Referring MD: Abner Greenspan, MD   Chief Complaint  Patient presents with   Other    Ostium secundum type atrial septal defect no complaints today. Meds reviewed verbally with pt.    History of Present Illness:    Lindsey [name] is a 67 y.o. female with a hx of hypertension, hyperlipidemia, PFO, family history of early CAD, OSA s/p inspire device 12/2021 who presents with chest pain, history of PFO.  Patient recently had inspire device placed as a result of OSA management.  Anesthesia team told her she needs to follow-up with cardiology due to history of PFO.  She endorses chronic shortness of breath, chest pain which she attributes to GERD and fibromyalgia.  Chest pain not associated with exertion.  Father had CAD in his 57s.  She has statin induced myalgias.  She has been told she has PFO and cardiac normal in the past.   Past Medical History:  Diagnosis Date   Arthritis    Cataract    bil - not ready for surgery   Chronic fatigue    Early onset macular degeneration    Bilateral   Fibromyalgia    GERD (gastroesophageal reflux disease)    Heart murmur    Hemorrhoids    Hyperglycemia 12/24/2013   Hyperlipidemia    Hypersomnia    Hypertension    IBS (irritable bowel syndrome)    Interstitial cystitis    Migraines    OSA (obstructive sleep apnea)    Osteopenia    Pelvic floor dysfunction    PFO (patent foramen ovale)    Pneumonia    Pre-diabetes    Tendonitis    hand   Vitamin D deficiency     Past Surgical History:  Procedure Laterality Date   ANKLE ARTHROSCOPY Right    ANKLE FRACTURE SURGERY Left    BLADDER SURGERY     and hydrodistension   BREAST EXCISIONAL BIOPSY Bilateral    Multiple Benign Lumps removed     BREAST LUMPECTOMY     many, bilateral   CERVICAL  DISCECTOMY  2006   fusion and plating   CHOLECYSTECTOMY N/A 09/26/2015   Procedure: LAPAROSCOPIC CHOLECYSTECTOMY WITH INTRAOPERATIVE CHOLANGIOGRAM;  Surgeon: Johnathan Hausen, MD;  Location: [facility];  Service: General;  Laterality: N/A;   COLONOSCOPY     DRUG INDUCED ENDOSCOPY Bilateral 10/17/2021   Procedure: DRUG INDUCED ENDOSCOPY;  Surgeon: [name], MD;  Location: [place];  Service: ENT;  Laterality: Bilateral;   FRACTURE SURGERY     ankle fracture x 2   IMPLANTATION OF HYPOGLOSSAL NERVE STIMULATOR Right 01/22/2022   Procedure: IMPLANTATION OF HYPOGLOSSAL NERVE STIMULATOR;  Surgeon: [name], MD;  Location: [place];  Service: ENT;  Laterality: Right;   NASAL SINUS SURGERY     multiple, x4   UPPER GASTROINTESTINAL ENDOSCOPY     VAGINAL HYSTERECTOMY  1990   because of the IC partial    Current Medications: Current Meds  Medication Sig   atenolol (TENORMIN) 100 MG tablet Take 1 tablet (100 mg total) by mouth daily.   baclofen (LIORESAL) 20 MG tablet Take 20 mg by mouth daily as needed for muscle spasms.   botulinum toxin Type A (BOTOX) 100 units SOLR injection Inject 200 Units  into the muscle every 3 (three) months.   calcium carbonate (TUMS - DOSED IN MG ELEMENTAL CALCIUM) 500 MG chewable tablet Chew 1-2 tablets by mouth 2 (two) times daily as needed for indigestion or heartburn.   Cholecalciferol (VITAMIN D-3) 125 MCG (5000 UT) TABS Take 5,000 Units by mouth every evening.   diclofenac sodium (VOLTAREN) 1 % GEL Apply 2 to 4 gram to affected joints up to 4 times daily   docusate sodium (COLACE) 100 MG capsule Take 200 mg by mouth at bedtime.   DULoxetine (CYMBALTA) 60 MG capsule TAKE 1 CAPSULE BY MOUTH EVERY DAY   ezetimibe (ZETIA) 10 MG tablet Take 1 tablet (10 mg total) by mouth daily.   famotidine (PEPCID) 20 MG tablet Take 20 mg by mouth daily as needed for heartburn or indigestion.   fentaNYL (DURAGESIC - DOSED MCG/HR) 50 MCG/HR Place 50 mcg onto the skin  every other day.   HYDROcodone-acetaminophen (NORCO) 5-325 MG tablet Take 1-2 tablets by mouth every 6 (six) hours as needed for moderate pain.   hydrOXYzine (ATARAX/VISTARIL) 50 MG tablet Take 50 mg by mouth at bedtime.   ibuprofen (ADVIL) 200 MG tablet Take 400 mg by mouth every 8 (eight) hours as needed (pain.).   lidocaine (XYLOCAINE) 5 % ointment Apply 1 Application topically daily as needed (pain.).   LUTEIN PO Take 40 mg by mouth at bedtime.   magnesium oxide (MAG-OX) 400 (240 Mg) MG tablet Take 400 mg by mouth at bedtime.   metoprolol tartrate (LOPRESSOR) 100 MG tablet Take one tablet ('100mg'$ ) by mouth two hours prior to Cardiac CTA   Polyethyl Glycol-Propyl Glycol (LUBRICANT EYE DROPS) 0.4-0.3 % SOLN Place 1-2 drops into both eyes 3 (three) times daily as needed (dry/irritated eyes).   polyethylene glycol powder (GLYCOLAX/MIRALAX) 17 GM/SCOOP powder Take 17 g by mouth daily as needed (constipation.).     Allergies:   Buspirone hcl, Crestor [rosuvastatin], Simvastatin, and Sulfa antibiotics   Social History   Socioeconomic History   Marital status: Married    Spouse name: Not on file   Number of children: 0   Years of education: Not on file   Highest education level: Not on file  Occupational History   Occupation: Disabled from Deputy: DISABLED  Tobacco Use   Smoking status: Never    Passive exposure: Past   Smokeless tobacco: Never  Vaping Use   Vaping Use: Never used  Substance and Sexual Activity   Alcohol use: No    Alcohol/week: 0.0 standard drinks of alcohol   Drug use: No   Sexual activity: Yes  Other Topics Concern   Not on file  Social History Narrative   Not on file   Social Determinants of Health   Financial Resource Strain: Low Risk  (03/04/2022)   Overall Financial Resource Strain (CARDIA)    Difficulty of Paying Living Expenses: Not hard at all  Food Insecurity: No Food Insecurity (03/04/2022)   Hunger Vital Sign    Worried About Running Out  of Food in the Last Year: Never true    [place] in the Last Year: Never true  Transportation Needs: No Transportation Needs (03/04/2022)   PRAPARE - Hydrologist (Medical): No    Lack of Transportation (Non-Medical): No  Physical Activity: Inactive (03/04/2022)   Exercise Vital Sign    Days of Exercise per Week: 0 days    Minutes of Exercise per Session: 0 min  Stress:  Stress Concern Present (03/04/2022)   [place]    Feeling of Stress : To some extent  Social Connections: Socially Isolated (03/04/2022)   Social Connection and Isolation Panel [NHANES]    Frequency of Communication with Friends and Family: Never    Frequency of Social Gatherings with Friends and Family: Never    Attends Religious Services: Never    Printmaker: No    Attends Music therapist: Never    Marital Status: Married     Family History: The patient's family history includes Alcohol abuse in her father; Colon cancer in her paternal uncle; Diabetes in her father, paternal grandfather, and paternal grandmother; Fibromyalgia in her mother; Heart attack in her father; Heart disease in her father; Stomach cancer in her paternal grandfather; Stroke in her paternal grandmother; Thyroid disease in her mother. There is no history of Breast cancer.  ROS:   Please see the history of present illness.     All other systems reviewed and are negative.  EKGs/Labs/Other Studies Reviewed:    The following studies were reviewed today:   EKG:  EKG is  ordered today.  The ekg ordered today demonstrates normal sinus rhythm, left bundle branch block  Recent Labs: 12/31/2021: ALT 15; BUN 9; Creatinine, Ser 0.89; Hemoglobin 14.3; Platelets 219.0; Potassium 5.0; Sodium 137; TSH 3.20  Recent Lipid Panel    Component Value Date/Time   CHOL 200 12/31/2021 1153   TRIG 171.0 (H) 12/31/2021 1153    HDL 45.50 12/31/2021 1153   CHOLHDL 4 12/31/2021 1153   VLDL 34.2 12/31/2021 1153   LDLCALC 120 (H) 12/31/2021 1153   LDLDIRECT 137.4 11/27/2012 1403     Risk Assessment/Calculations:    HYPERTENSION CONTROL Vitals:   04/15/22 1417 04/15/22 1423  BP: (!) 148/100 (!) 146/99    The patient's blood pressure is elevated above target today.  In order to address the patient's elevated BP: Blood pressure will be monitored at home to determine if medication changes need to be made.            Physical Exam:    VS:  BP (!) 146/99 (BP Location: Right Arm, Patient Position: Sitting, Cuff Size: Normal)   Pulse 65   Ht '5\' 4"'$  (1.626 m)   Wt 183 lb 6 oz (83.2 kg)   SpO2 99%   BMI 31.48 kg/m     Wt Readings from Last 3 Encounters:  04/15/22 183 lb 6 oz (83.2 kg)  01/22/22 184 lb 8.4 oz (83.7 kg)  01/07/22 186 lb 6 oz (84.5 kg)     GEN:  Well nourished, well developed in no acute distress HEENT: Normal NECK: No JVD; No carotid bruits CARDIAC: RRR, 2/6 systolic murmur RESPIRATORY:  Clear to auscultation without rales, wheezing or rhonchi  ABDOMEN: Soft, non-tender, non-distended MUSCULOSKELETAL:  No edema; No deformity  SKIN: Warm and dry NEUROLOGIC:  Alert and oriented x 3 PSYCHIATRIC:  Normal affect   ASSESSMENT:    1. Precordial chest pain   2. Systolic murmur   3. PFO (patent foramen ovale)   4. Primary hypertension   5. Mixed hyperlipidemia    PLAN:    In order of problems listed above:  Chest pain, risk factors family history of early CAD, hypertension, hyperlipidemia.  Get echo, get coronary CTA. Systolic murmur, obtain echo to evaluate any significant structural abnormalities. History of PFO, obtain echo with saline contrast. Hypertension, BP elevated, usually controlled.  Continue atenolol. Hyperlipidemia, statin intolerance.  Continue Zetia.  Follow-up after echo and coronary CT     Medication Adjustments/Labs and Tests Ordered: Current medicines  are reviewed at length with the patient today.  Concerns regarding medicines are outlined above.  Orders Placed This Encounter  Procedures   CT CORONARY MORPH W/CTA COR W/SCORE W/CA W/CM &/OR WO/CM   Basic Metabolic Panel (BMET)   EKG 12-Lead   ECHOCARDIOGRAM COMPLETE BUBBLE STUDY   Meds ordered this encounter  Medications   metoprolol tartrate (LOPRESSOR) 100 MG tablet    Sig: Take one tablet ('100mg'$ ) by mouth two hours prior to Cardiac CTA    Dispense:  1 tablet    Refill:  0    Patient Instructions  Medication Instructions:   Your physician recommends that you continue on your current medications as directed. Please refer to the Current Medication list given to you today.  *If you need a refill on your cardiac medications before your next appointment, please call your pharmacy*   Lab Work:  Your physician recommends you go to the medical mall to have lab work completed.   If you have labs (blood work) drawn today and your tests are completely normal, you will receive your results only by: [place] (if you have MyChart) OR A paper copy in the mail If you have any lab test that is abnormal or we need to change your treatment, we will call you to review the results.   Testing/Procedures:   Echocardiogram - with bubbles  Your physician has requested that you have an echocardiogram. Echocardiography is a painless test that uses sound waves to create images of your heart. It provides your doctor with information about the size and shape of your heart and how well your heart's chambers and valves are working. This procedure takes approximately one hour. There are no restrictions for this procedure. Please note; depending on visual quality an IV may need to be placed.     Your cardiac CT will be scheduled at [facility] [address] [place], [place] [address] [phone] Please arrive 15 mins early for check-in and test  prep.   Please follow these instructions carefully (unless otherwise directed):    On the Night Before the Test: Be sure to Drink plenty of water. Do not consume any caffeinated/decaffeinated beverages or chocolate 12 hours prior to your test. Do not take any antihistamines 12 hours prior to your test.  On the Day of the Test: Drink plenty of water until 1 hour prior to the test. Do not eat any food 1 hour prior to test. You may take your regular medications prior to the test.  Take metoprolol (Lopressor) two hours prior to test. FEMALES- please wear underwire-free bra if available, avoid dresses & tight clothing       After the Test: Drink plenty of water. After receiving IV contrast, you may experience a mild flushed feeling. This is normal. On occasion, you may experience a mild rash up to 24 hours after the test. This is not dangerous. If this occurs, you can take Benadryl 25 mg and increase your fluid intake. If you experience trouble breathing, this can be serious. If it is severe call 911 IMMEDIATELY. If it is mild, please call our office.  We will call to schedule your test 2-4 weeks out understanding that some insurance companies will need an authorization prior to the service being performed.   For non-scheduling related questions, please  contact the cardiac imaging nurse navigator should you have any questions/concerns: Marchia Bond, Cardiac Imaging Nurse Navigator Gordy Clement, Cardiac Imaging Nurse Navigator [facility] Direct Office Dial: [phone]   For scheduling needs, including cancellations and rescheduling, please call [place], [phone].    Follow-Up: At [facility], you and your health needs are our priority.  As part of our continuing mission to provide you with exceptional heart care, we have created designated Provider Care Teams.  These Care Teams include your primary Cardiologist (physician) and Advanced  Practice Providers (APPs -  Physician Assistants and Nurse Practitioners) who all work together to provide you with the care you need, when you need it.  We recommend signing up for the patient portal called "MyChart".  Sign up information is provided on this After Visit Summary.  MyChart is used to connect with patients for Virtual Visits (Telemedicine).  Patients are able to view lab/test results, encounter notes, upcoming appointments, etc.  Non-urgent messages can be sent to your provider as well.   To learn more about what you can do with MyChart, go to [email or web address].    Your next appointment:   8 - 10 week(s)  Provider:   You may see Kate Sable, MD or one of the following Advanced Practice Providers on your designated Care Team:   Murray Hodgkins, NP Christell Faith, PA-C Cadence Kathlen Mody, PA-C Gerrie Nordmann, NP   Signed, Kate Sable, MD  04/15/2022 3:34 PM    [place]

## 2022-04-15 NOTE — Patient Instructions (Addendum)
Medication Instructions:   Your physician recommends that you continue on your current medications as directed. Please refer to the Current Medication list given to you today.  *If you need a refill on your cardiac medications before your next appointment, please call your pharmacy*   Lab Work:  Your physician recommends you go to the medical mall to have lab work completed.   If you have labs (blood work) drawn today and your tests are completely normal, you will receive your results only by: [place] (if you have MyChart) OR A paper copy in the mail If you have any lab test that is abnormal or we need to change your treatment, we will call you to review the results.   Testing/Procedures:   Echocardiogram - with bubbles  Your physician has requested that you have an echocardiogram. Echocardiography is a painless test that uses sound waves to create images of your heart. It provides your doctor with information about the size and shape of your heart and how well your heart's chambers and valves are working. This procedure takes approximately one hour. There are no restrictions for this procedure. Please note; depending on visual quality an IV may need to be placed.     Your cardiac CT will be scheduled at [facility] [address] [place], [place] [address] [phone] Please arrive 15 mins early for check-in and test prep.   Please follow these instructions carefully (unless otherwise directed):    On the Night Before the Test: Be sure to Drink plenty of water. Do not consume any caffeinated/decaffeinated beverages or chocolate 12 hours prior to your test. Do not take any antihistamines 12 hours prior to your test.  On the Day of the Test: Drink plenty of water until 1 hour prior to the test. Do not eat any food 1 hour prior to test. You may take your regular medications prior to the test.  Take metoprolol  (Lopressor) two hours prior to test. FEMALES- please wear underwire-free bra if available, avoid dresses & tight clothing       After the Test: Drink plenty of water. After receiving IV contrast, you may experience a mild flushed feeling. This is normal. On occasion, you may experience a mild rash up to 24 hours after the test. This is not dangerous. If this occurs, you can take Benadryl 25 mg and increase your fluid intake. If you experience trouble breathing, this can be serious. If it is severe call 911 IMMEDIATELY. If it is mild, please call our office.  We will call to schedule your test 2-4 weeks out understanding that some insurance companies will need an authorization prior to the service being performed.   For non-scheduling related questions, please contact the cardiac imaging nurse navigator should you have any questions/concerns: Marchia Bond, Cardiac Imaging Nurse Navigator Gordy Clement, Cardiac Imaging Nurse Navigator [facility] Direct Office Dial: [phone]   For scheduling needs, including cancellations and rescheduling, please call [place], [phone].    Follow-Up: At [facility], you and your health needs are our priority.  As part of our continuing mission to provide you with exceptional heart care, we have created designated Provider Care Teams.  These Care Teams include your primary Cardiologist (physician) and Advanced Practice Providers (APPs -  Physician Assistants and Nurse Practitioners) who all work together to provide you with the care you need, when you need it.  We recommend signing up for the patient  portal called "MyChart".  Sign up information is provided on this After Visit Summary.  MyChart is used to connect with patients for Virtual Visits (Telemedicine).  Patients are able to view lab/test results, encounter notes, upcoming appointments, etc.  Non-urgent messages can be sent to your provider as well.   To  learn more about what you can do with MyChart, go to [email or web address].    Your next appointment:   8 - 10 week(s)  Provider:   You may see Kate Sable, MD or one of the following Advanced Practice Providers on your designated Care Team:   Murray Hodgkins, NP Christell Faith, PA-C Cadence Kathlen Mody, PA-C Gerrie Nordmann, NP

## 2022-05-01 ENCOUNTER — Telehealth (HOSPITAL_COMMUNITY): Payer: Self-pay | Admitting: *Deleted

## 2022-05-01 NOTE — Telephone Encounter (Signed)
Reaching out to patient to offer assistance regarding upcoming cardiac imaging study; pt verbalizes understanding of appt date/time, parking situation and where to check in, pre-test NPO status and medications ordered, and verified current allergies; name and call back number provided for further questions should they arise  Gordy Clement RN Navigator Cardiac Imaging [facility] [phone] office [phone] cell  Patient to check her HR two hours prior to her cardiac CT scan and take '50mg'$  metoprolol two hours prior to her cardiac CT scan if her HR is greater than 60bpm.

## 2022-05-02 ENCOUNTER — Ambulatory Visit
Admission: RE | Admit: 2022-05-02 | Discharge: 2022-05-02 | Disposition: A | Discharge status: Home / Self Care | Payer: Medicare Other | Source: Ambulatory Visit | Attending: Cardiology | Admitting: Cardiology

## 2022-05-02 DIAGNOSIS — R072 Precordial pain: Secondary | ICD-10-CM | POA: Diagnosis not present

## 2022-05-02 DIAGNOSIS — R011 Cardiac murmur, unspecified: Secondary | ICD-10-CM | POA: Diagnosis present

## 2022-05-02 MED ORDER — IOHEXOL 350 MG/ML SOLN
85.0000 mL | Freq: Once | INTRAVENOUS | Status: AC | PRN
  Administered 2022-05-02: 85 mL via INTRAVENOUS

## 2022-05-02 MED ORDER — NITROGLYCERIN 0.4 MG SL SUBL
0.8000 mg | SUBLINGUAL_TABLET | Freq: Once | SUBLINGUAL | Status: AC
  Administered 2022-05-02: 0.8 mg via SUBLINGUAL

## 2022-05-02 NOTE — Progress Notes (Signed)
Patient tolerated CT well. Drank water after. Vital signs stable encourage to drink water throughout day.Reasons explained and verbalized understanding. Ambulated steady gait.  

## 2022-05-17 ENCOUNTER — Ambulatory Visit: Payer: Medicare Other | Attending: Cardiology

## 2022-05-17 DIAGNOSIS — R072 Precordial pain: Secondary | ICD-10-CM

## 2022-05-17 LAB — ECHOCARDIOGRAM COMPLETE BUBBLE STUDY
AR max vel: 1.04 cm2
AV Area VTI: 1.21 cm2
AV Area mean vel: 1.13 cm2
AV Mean grad: 12 mmHg
AV Peak grad: 23.4 mmHg
Ao pk vel: 2.42 m/s
Area-P 1/2: 2.31 cm2
P 1/2 time: 760 msec
S' Lateral: 2.5 cm

## 2022-05-17 MED ORDER — PERFLUTREN LIPID MICROSPHERE
1.0000 mL | INTRAVENOUS | Status: AC | PRN
  Administered 2022-05-17: 4 mL via INTRAVENOUS

## 2022-06-21 ENCOUNTER — Ambulatory Visit: Payer: Medicare Other | Attending: Cardiology | Admitting: Cardiology

## 2022-06-21 ENCOUNTER — Encounter: Payer: Self-pay | Admitting: Cardiology

## 2022-06-21 VITALS — BP 138/88 | HR 73 | Ht 64.0 in | Wt 181.4 lb

## 2022-06-21 DIAGNOSIS — E782 Mixed hyperlipidemia: Secondary | ICD-10-CM | POA: Insufficient documentation

## 2022-06-21 DIAGNOSIS — R072 Precordial pain: Secondary | ICD-10-CM | POA: Insufficient documentation

## 2022-06-21 DIAGNOSIS — I10 Essential (primary) hypertension: Secondary | ICD-10-CM | POA: Insufficient documentation

## 2022-06-21 MED ORDER — BEMPEDOIC ACID 180 MG PO TABS: 1.0000 | ORAL_TABLET | Freq: Every day | ORAL | 3 refills | Status: DC

## 2022-06-21 NOTE — Patient Instructions (Signed)
Medication Instructions:   START Bempedoic Acid 180 MG TABS - take one tablet (180mg ) by mouth daily.   *If you need a refill on your cardiac medications before your next appointment, please call your pharmacy*   Lab Work:  Your physician recommends that you return for lab work in: 3 months at the medical mall. You will need to be fasting.  No appt is needed. Hours are M-F 7AM- 6 PM.  If you have labs (blood work) drawn today and your tests are completely normal, you will receive your results only by: [place] (if you have MyChart) OR A paper copy in the mail If you have any lab test that is abnormal or we need to change your treatment, we will call you to review the results.   Testing/Procedures:  None ordered   Follow-Up: At [facility], you and your health needs are our priority.  As part of our continuing mission to provide you with exceptional heart care, we have created designated Provider Care Teams.  These Care Teams include your primary Cardiologist (physician) and Advanced Practice Providers (APPs -  Physician Assistants and Nurse Practitioners) who all work together to provide you with the care you need, when you need it.  We recommend signing up for the patient portal called "MyChart".  Sign up information is provided on this After Visit Summary.  MyChart is used to connect with patients for Virtual Visits (Telemedicine).  Patients are able to view lab/test results, encounter notes, upcoming appointments, etc.  Non-urgent messages can be sent to your provider as well.   To learn more about what you can do with MyChart, go to [email or web address].    Your next appointment:   3 month(s)  Provider:   You may see Kate Sable, MD or one of the following Advanced Practice Providers on your designated Care Team:   Murray Hodgkins, NP Christell Faith, PA-C Cadence Kathlen Mody, PA-C Gerrie Nordmann, NP

## 2022-06-21 NOTE — Progress Notes (Signed)
Cardiology Office Note:    Date:  06/21/2022   ID:  Lindsey [name], DOB 06-19-1955, MRN [id number]  PCP:  Abner Greenspan, MD   [place] Providers Cardiologist:  Kate Sable, MD     Referring MD: Abner Greenspan, MD   Chief Complaint  Patient presents with   8-10 week follow up     "Doing well." Medications reviewed by the patient verbally.     History of Present Illness:    Lindsey [name] is a 67 y.o. female with a hx of hypertension, hyperlipidemia, family history of early CAD, OSA s/p inspire device 12/2021 who presents for follow-up.  Previously seen with chest pain and history of PFO.  Echo and coronary CTA was obtained.  She has allergies to statins, tolerating Zetia as prescribed.  She feels well, has no concerns at this time, presents for testing results.  Was previously told she has PFO.   Past Medical History:  Diagnosis Date   Arthritis    Cataract    bil - not ready for surgery   Chronic fatigue    Early onset macular degeneration    Bilateral   Fibromyalgia    GERD (gastroesophageal reflux disease)    Heart murmur    Hemorrhoids    Hyperglycemia 12/24/2013   Hyperlipidemia    Hypersomnia    Hypertension    IBS (irritable bowel syndrome)    Interstitial cystitis    Migraines    OSA (obstructive sleep apnea)    Osteopenia    Pelvic floor dysfunction    PFO (patent foramen ovale)    Pneumonia    Pre-diabetes    Tendonitis    hand   Vitamin D deficiency     Past Surgical History:  Procedure Laterality Date   ANKLE ARTHROSCOPY Right    ANKLE FRACTURE SURGERY Left    BLADDER SURGERY     and hydrodistension   BREAST EXCISIONAL BIOPSY Bilateral    Multiple Benign Lumps removed     BREAST LUMPECTOMY     many, bilateral   CERVICAL DISCECTOMY  2006   fusion and plating   CHOLECYSTECTOMY N/A 09/26/2015   Procedure: LAPAROSCOPIC CHOLECYSTECTOMY WITH INTRAOPERATIVE CHOLANGIOGRAM;  Surgeon: Johnathan Hausen, MD;  Location: [facility];   Service: General;  Laterality: N/A;   COLONOSCOPY     DRUG INDUCED ENDOSCOPY Bilateral 10/17/2021   Procedure: DRUG INDUCED ENDOSCOPY;  Surgeon: [name], MD;  Location: [place];  Service: ENT;  Laterality: Bilateral;   FRACTURE SURGERY     ankle fracture x 2   IMPLANTATION OF HYPOGLOSSAL NERVE STIMULATOR Right 01/22/2022   Procedure: IMPLANTATION OF HYPOGLOSSAL NERVE STIMULATOR;  Surgeon: [name], MD;  Location: [place];  Service: ENT;  Laterality: Right;   NASAL SINUS SURGERY     multiple, x4   UPPER GASTROINTESTINAL ENDOSCOPY     VAGINAL HYSTERECTOMY  1990   because of the IC partial    Current Medications: Current Meds  Medication Sig   atenolol (TENORMIN) 100 MG tablet Take 1 tablet (100 mg total) by mouth daily.   baclofen (LIORESAL) 20 MG tablet Take 20 mg by mouth daily as needed for muscle spasms.   Bempedoic Acid 180 MG TABS Take 1 tablet (180 mg total) by mouth daily.   botulinum toxin Type A (BOTOX) 100 units SOLR injection Inject 200 Units into the muscle every 3 (three) months.   calcium carbonate (TUMS - DOSED IN MG ELEMENTAL CALCIUM) 500 MG chewable  tablet Chew 1-2 tablets by mouth 2 (two) times daily as needed for indigestion or heartburn.   Cholecalciferol (VITAMIN D-3) 125 MCG (5000 UT) TABS Take 5,000 Units by mouth every evening.   diclofenac sodium (VOLTAREN) 1 % GEL Apply 2 to 4 gram to affected joints up to 4 times daily   docusate sodium (COLACE) 100 MG capsule Take 200 mg by mouth at bedtime.   DULoxetine (CYMBALTA) 60 MG capsule TAKE 1 CAPSULE BY MOUTH EVERY DAY   ezetimibe (ZETIA) 10 MG tablet Take 1 tablet (10 mg total) by mouth daily.   famotidine (PEPCID) 20 MG tablet Take 20 mg by mouth daily as needed for heartburn or indigestion.   fentaNYL (DURAGESIC - DOSED MCG/HR) 50 MCG/HR Place 50 mcg onto the skin every other day.   HYDROcodone-acetaminophen (NORCO) 5-325 MG tablet Take 1-2 tablets by mouth every 6 (six) hours as needed  for moderate pain.   hydrOXYzine (ATARAX/VISTARIL) 50 MG tablet Take 50 mg by mouth at bedtime.   ibuprofen (ADVIL) 200 MG tablet Take 400 mg by mouth every 8 (eight) hours as needed (pain.).   lidocaine (XYLOCAINE) 5 % ointment Apply 1 Application topically daily as needed (pain.).   LUTEIN PO Take 40 mg by mouth at bedtime.   magnesium oxide (MAG-OX) 400 (240 Mg) MG tablet Take 400 mg by mouth at bedtime.   Polyethyl Glycol-Propyl Glycol (LUBRICANT EYE DROPS) 0.4-0.3 % SOLN Place 1-2 drops into both eyes 3 (three) times daily as needed (dry/irritated eyes).   polyethylene glycol powder (GLYCOLAX/MIRALAX) 17 GM/SCOOP powder Take 17 g by mouth daily as needed (constipation.).     Allergies:   Buspirone hcl, Crestor [rosuvastatin], Simvastatin, Sulfa antibiotics, and Sulfasalazine   Social History   Socioeconomic History   Marital status: Married    Spouse name: Not on file   Number of children: 0   Years of education: Not on file   Highest education level: Not on file  Occupational History   Occupation: Disabled from [place]: DISABLED  Tobacco Use   Smoking status: Never    Passive exposure: Past   Smokeless tobacco: Never  Vaping Use   Vaping Use: Never used  Substance and Sexual Activity   Alcohol use: No    Alcohol/week: 0.0 standard drinks of alcohol   Drug use: No   Sexual activity: Yes  Other Topics Concern   Not on file  Social History Narrative   Not on file   Social Determinants of Health   Financial Resource Strain: Low Risk  (03/04/2022)   Overall Financial Resource Strain (CARDIA)    Difficulty of Paying Living Expenses: Not hard at all  Food Insecurity: No Food Insecurity (03/04/2022)   Hunger Vital Sign    Worried About Running Out of Food in the Last Year: Never true    [place] in the Last Year: Never true  Transportation Needs: No Transportation Needs (03/04/2022)   PRAPARE - Hydrologist (Medical): No    Lack  of Transportation (Non-Medical): No  Physical Activity: Inactive (03/04/2022)   Exercise Vital Sign    Days of Exercise per Week: 0 days    Minutes of Exercise per Session: 0 min  Stress: Stress Concern Present (03/04/2022)   [place]    Feeling of Stress : To some extent  Social Connections: Socially Isolated (03/04/2022)   Social Connection and Isolation Panel [NHANES]  Frequency of Communication with Friends and Family: Never    Frequency of Social Gatherings with Friends and Family: Never    Attends Religious Services: Never    Marine scientist or Organizations: No    Attends Music therapist: Never    Marital Status: Married     Family History: The patient's family history includes Alcohol abuse in her father; Colon cancer in her paternal uncle; Diabetes in her father, paternal grandfather, and paternal grandmother; Fibromyalgia in her mother; Heart attack in her father; Heart disease in her father; Stomach cancer in her paternal grandfather; Stroke in her paternal grandmother; Thyroid disease in her mother. There is no history of Breast cancer.  ROS:   Please see the history of present illness.     All other systems reviewed and are negative.  EKGs/Labs/Other Studies Reviewed:    The following studies were reviewed today:   EKG:  EKG not ordered today.    Recent Labs: 12/31/2021: ALT 15; Hemoglobin 14.3; Platelets 219.0; TSH 3.20 04/15/2022: BUN 13; Creatinine, Ser 0.83; Potassium 4.4; Sodium 135  Recent Lipid Panel    Component Value Date/Time   CHOL 200 12/31/2021 1153   TRIG 171.0 (H) 12/31/2021 1153   HDL 45.50 12/31/2021 1153   CHOLHDL 4 12/31/2021 1153   VLDL 34.2 12/31/2021 1153   LDLCALC 120 (H) 12/31/2021 1153   LDLDIRECT 137.4 11/27/2012 1403     Risk Assessment/Calculations:              Physical Exam:    VS:  BP 138/88 (BP Location: Left Arm, Patient Position:  Sitting, Cuff Size: Large)   Pulse 73   Ht 5\' 4"  (1.626 m)   Wt 181 lb 6 oz (82.3 kg)   SpO2 96%   BMI 31.13 kg/m     Wt Readings from Last 3 Encounters:  06/21/22 181 lb 6 oz (82.3 kg)  04/15/22 183 lb 6 oz (83.2 kg)  01/22/22 184 lb 8.4 oz (83.7 kg)     GEN:  Well nourished, well developed in no acute distress HEENT: Normal NECK: No JVD; No carotid bruits CARDIAC: RRR, 2/6 systolic murmur RESPIRATORY:  Clear to auscultation without rales, wheezing or rhonchi  ABDOMEN: Soft, non-tender, non-distended MUSCULOSKELETAL:  No edema; No deformity  SKIN: Warm and dry NEUROLOGIC:  Alert and oriented x 3 PSYCHIATRIC:  Normal affect   ASSESSMENT:    1. Precordial chest pain   2. Primary hypertension   3. Mixed hyperlipidemia     PLAN:    In order of problems listed above:  Chest pain, coronary CT with calcium score 0, no evidence of CAD.  Echo with normal EF 60 to 65%, mild aortic regurgitation.  No evidence for PFO. Hypertension, BP controlled.  Continue atenolol. Hyperlipidemia, statin intolerance.  LDL not at goal.  Start bempedoic acid, continue Zetia.  Repeat lipid panel in 3 months.  Follow-up in 3 months     Medication Adjustments/Labs and Tests Ordered: Current medicines are reviewed at length with the patient today.  Concerns regarding medicines are outlined above.  Orders Placed This Encounter  Procedures   Lipid panel   Meds ordered this encounter  Medications   Bempedoic Acid 180 MG TABS    Sig: Take 1 tablet (180 mg total) by mouth daily.    Dispense:  30 tablet    Refill:  3    Patient Instructions  Medication Instructions:   START Bempedoic Acid 180 MG TABS - take one tablet (  180mg ) by mouth daily.   *If you need a refill on your cardiac medications before your next appointment, please call your pharmacy*   Lab Work:  Your physician recommends that you return for lab work in: 3 months at the medical mall. You will need to be fasting.  No appt  is needed. Hours are M-F 7AM- 6 PM.  If you have labs (blood work) drawn today and your tests are completely normal, you will receive your results only by: [place] (if you have MyChart) OR A paper copy in the mail If you have any lab test that is abnormal or we need to change your treatment, we will call you to review the results.   Testing/Procedures:  None ordered   Follow-Up: At [facility], you and your health needs are our priority.  As part of our continuing mission to provide you with exceptional heart care, we have created designated Provider Care Teams.  These Care Teams include your primary Cardiologist (physician) and Advanced Practice Providers (APPs -  Physician Assistants and Nurse Practitioners) who all work together to provide you with the care you need, when you need it.  We recommend signing up for the patient portal called "MyChart".  Sign up information is provided on this After Visit Summary.  MyChart is used to connect with patients for Virtual Visits (Telemedicine).  Patients are able to view lab/test results, encounter notes, upcoming appointments, etc.  Non-urgent messages can be sent to your provider as well.   To learn more about what you can do with MyChart, go to [email or web address].    Your next appointment:   3 month(s)  Provider:   You may see Kate Sable, MD or one of the following Advanced Practice Providers on your designated Care Team:   Murray Hodgkins, NP Christell Faith, PA-C Cadence Kathlen Mody, PA-C Gerrie Nordmann, NP   Signed, Kate Sable, MD  06/21/2022 5:54 PM    Between

## 2022-06-22 ENCOUNTER — Other Ambulatory Visit: Payer: Self-pay | Admitting: Physician Assistant

## 2022-06-25 NOTE — Telephone Encounter (Signed)
Last Fill: 03/27/2022  Next Visit: 07/09/2022  Last Visit: 01/02/2022  Dx: History of depression   Current Dose per office note on 01/02/2022: Cymbalta 60 mg 1 capsule by mouth daily.   Okay to refill Cymbalta?

## 2022-06-26 NOTE — Progress Notes (Signed)
Office Visit Note  Patient: Lindsey [name]             Date of Birth: May 27, 1955           MRN: [id number]             PCP: Judy Pimple, MD Referring: Tower, Audrie Gallus, MD Visit Date: 07/09/2022 Occupation: @GUAROCC @  Subjective:  Increased pain and insomnia.  History of Present Illness: Lindsey [name] is a 67 y.o. female history of fibromyalgia, osteoarthritis and degenerative disc disease.  She states she had an inspire implant placement in October 2023.  She states since then she has been experiencing discomfort in her chest and also in the back of her tongue.  She states the implant could not be activated as she is still in discomfort.  She states the discomfort has not improved at all.  It is making her insomnia and fibromyalgia worse.  She is in constant discomfort.  She continues to have pain in her hands and her knee joints.    Activities of Daily Living:  Patient reports morning stiffness for 24 hours.   Patient Reports nocturnal pain.  Difficulty dressing/grooming: Denies Difficulty climbing stairs: Reports Difficulty getting out of chair: Denies Difficulty using hands for taps, buttons, cutlery, and/or writing: Reports  Review of Systems  Constitutional:  Positive for fatigue.  HENT:  Positive for mouth dryness.   Eyes:  Positive for dryness.  Respiratory:  Negative for difficulty breathing.   Cardiovascular:  Positive for chest pain. Negative for palpitations.       From surgery  Gastrointestinal:  Positive for constipation. Negative for blood in stool and diarrhea.  Endocrine: Positive for increased urination.  Genitourinary:  Positive for involuntary urination.  Musculoskeletal:  Positive for joint pain, gait problem, joint pain, myalgias, muscle weakness, morning stiffness, muscle tenderness and myalgias. Negative for joint swelling.  Skin:  Positive for sensitivity to sunlight. Negative for color change, rash and hair loss.  Allergic/Immunologic: Negative for  susceptible to infections.  Neurological:  Positive for dizziness and headaches.  Hematological:  Negative for swollen glands.  Psychiatric/Behavioral:  Positive for depressed mood and sleep disturbance. The patient is nervous/anxious.     PMFS History:  Patient Active Problem List   Diagnosis Date Noted   Colon cancer screening 11/06/2020   Elevated TSH 05/29/2020   Prediabetes 08/09/2018   Estrogen deficiency 01/28/2018   Autoimmune disease 06/18/2017   History of recurrent cystitis (IC) 08/16/2016   Pelvic floor dysfunction 08/16/2016   Dysuria 05/24/2016   Macular degeneration 04/09/2016   Osteoarthritis, knee 02/15/2016   S/P laparoscopic cholecystectomy June 2017 09/26/2015   Dermatitis 10/31/2014   Encounter for Medicare annual wellness exam 09/04/2012   HSV (herpes simplex virus) infection 04/08/2012   IBS (irritable bowel syndrome) 10/21/2011   Routine general medical examination at a health care facility 07/02/2011   Osteopenia 05/17/2009   Depressed mood 10/05/2008   GERD 05/05/2008   OSA (obstructive sleep apnea) 06/25/2007   Hyperlipidemia 05/31/2007   Essential hypertension 05/31/2007   Other fatigue 04/23/2007   Vitamin D deficiency 01/22/2007   Migraine headache 01/22/2007   Interstitial cystitis 01/22/2007   MENOPAUSE-RELATED VASOMOTOR SYMPTOMS 01/22/2007   OSTEOARTHROSIS, GENERALIZED, MULTIPLE SITES 01/22/2007   Fibromyalgia 01/22/2007   PATENT FORAMEN OVALE 01/22/2007    Past Medical History:  Diagnosis Date   Arthritis    Cataract    bil - not ready for surgery   Chronic fatigue    Early onset  macular degeneration    Bilateral   Fibromyalgia    GERD (gastroesophageal reflux disease)    Heart murmur    Hemorrhoids    Hyperglycemia 12/24/2013   Hyperlipidemia    Hypersomnia    Hypertension    IBS (irritable bowel syndrome)    Interstitial cystitis    Migraines    OSA (obstructive sleep apnea)    Osteopenia    Pelvic floor dysfunction     PFO (patent foramen ovale)    Pneumonia    Pre-diabetes    Tendonitis    hand   Vitamin D deficiency     Family History  Problem Relation Age of Onset   Fibromyalgia Mother    Thyroid disease Mother    Heart attack Father    Diabetes Father    Alcohol abuse Father    Heart disease Father    Colon cancer Paternal Uncle    Diabetes Paternal Grandmother    Stroke Paternal Grandmother    Stomach cancer Paternal Grandfather    Diabetes Paternal Grandfather    Breast cancer Neg Hx    Past Surgical History:  Procedure Laterality Date   ANKLE ARTHROSCOPY Right    ANKLE FRACTURE SURGERY Left    BLADDER SURGERY     and hydrodistension   BREAST EXCISIONAL BIOPSY Bilateral    Multiple Benign Lumps removed     BREAST LUMPECTOMY     many, bilateral   CERVICAL DISCECTOMY  2006   fusion and plating   CHOLECYSTECTOMY N/A 09/26/2015   Procedure: LAPAROSCOPIC CHOLECYSTECTOMY WITH INTRAOPERATIVE CHOLANGIOGRAM;  Surgeon: Luretha Murphy, MD;  Location: [facility] ORS;  Service: General;  Laterality: N/A;   COLONOSCOPY     DRUG INDUCED ENDOSCOPY Bilateral 10/17/2021   Procedure: DRUG INDUCED ENDOSCOPY;  Surgeon: Christia Reading, MD;  Location: [facility];  Service: ENT;  Laterality: Bilateral;   FRACTURE SURGERY     ankle fracture x 2   IMPLANTATION OF HYPOGLOSSAL NERVE STIMULATOR Right 01/22/2022   Procedure: IMPLANTATION OF HYPOGLOSSAL NERVE STIMULATOR;  Surgeon: Christia Reading, MD;  Location: [facility];  Service: ENT;  Laterality: Right;   NASAL SINUS SURGERY     multiple, x4   UPPER GASTROINTESTINAL ENDOSCOPY     VAGINAL HYSTERECTOMY  1990   because of the IC partial   Social History   Social History Narrative   Not on file   Immunization History  Administered Date(s) Administered   Influenza Inj Mdck Quad Pf 12/16/2017   Influenza Whole 12/31/2006   Influenza, High Dose Seasonal PF 12/26/2021   Influenza,inj,Quad PF,6+ Mos 12/24/2013, 02/20/2016, 10/30/2016, 11/30/2016,  11/26/2018   PFIZER(Purple Top)SARS-COV-2 Vaccination 06/18/2019, 07/13/2019, 03/06/2020   PNEUMOCOCCAL CONJUGATE-20 01/07/2022   Pneumococcal Polysaccharide-23 09/04/2012   Td 04/23/2007   Tdap 06/17/2017     Objective: Vital Signs: BP 137/83 (BP Location: Left Arm, Patient Position: Sitting, Cuff Size: Normal)   Pulse 65   Resp 14   Ht 5\' 4"  (1.626 m)   Wt 181 lb (82.1 kg)   BMI 31.07 kg/m    Physical Exam Vitals and nursing note reviewed.  Constitutional:      Appearance: She is well-developed.  HENT:     Head: Normocephalic and atraumatic.  Eyes:     Conjunctiva/sclera: Conjunctivae normal.  Cardiovascular:     Rate and Rhythm: Normal rate and regular rhythm.     Heart sounds: Normal heart sounds.  Pulmonary:     Effort: Pulmonary effort is normal.     Breath sounds:  Normal breath sounds.  Abdominal:     General: Bowel sounds are normal.     Palpations: Abdomen is soft.  Musculoskeletal:     Cervical back: Normal range of motion.  Lymphadenopathy:     Cervical: No cervical adenopathy.  Skin:    General: Skin is warm and dry.     Capillary Refill: Capillary refill takes less than 2 seconds.     Comments: She has scar tissue on her right side of her neck and over the right-sided chest from the procedure.  Neurological:     Mental Status: She is alert and oriented to person, place, and time.  Psychiatric:        Behavior: Behavior normal.      Musculoskeletal Exam: Cervical, thoracic and lumbar spine were in good range of motion.  She had bilateral trapezius spasm.  Shoulder joints, elbow joints, wrist joints, MCPs PIPs and DIPs been good range of motion with no synovitis.  She had CMC tenderness.  Hip joints and knee joints with good range of motion.  There was no tenderness over ankles or MTPs.  She had generalized hyperalgesia and positive tender points.  CDAI Exam: CDAI Score: -- Patient Global: --; Provider Global: -- Swollen: --; Tender: -- Joint Exam  07/09/2022   No joint exam has been documented for this visit   There is currently no information documented on the homunculus. Go to the Rheumatology activity and complete the homunculus joint exam.  Investigation: No additional findings.  Imaging: No results found.  Recent Labs: Lab Results  Component Value Date   WBC 6.2 12/31/2021   HGB 14.3 12/31/2021   PLT 219.0 12/31/2021   NA 135 04/15/2022   K 4.4 04/15/2022   CL 102 04/15/2022   CO2 25 04/15/2022   GLUCOSE 103 (H) 04/15/2022   BUN 13 04/15/2022   CREATININE 0.83 04/15/2022   BILITOT 0.6 12/31/2021   ALKPHOS 75 12/31/2021   AST 14 12/31/2021   ALT 15 12/31/2021   PROT 6.7 12/31/2021   ALBUMIN 4.0 12/31/2021   CALCIUM 9.5 04/15/2022   GFRAA 98 04/23/2007    Speciality Comments: No specialty comments available.  Procedures:  No procedures performed Allergies: Buspirone hcl, Crestor [rosuvastatin], Simvastatin, Sulfa antibiotics, and Sulfasalazine   Assessment / Plan:     Visit Diagnoses: Fibromyalgia -she continues to have generalized pain and discomfort.  She had positive tender points.  She is on Cymbalta 60 mg 1 capsule daily.  She takes baclofen very sparingly as needed for muscle spasms.  Regular exercises including stretching, swimming and water aerobics were discussed.  Primary insomnia - underwent nocturnal polysomnography on 02/14/2020 followed by a home sleep test on 04/19/2020.  She could not tolerate CPAP.  [facility] and was referred to ENT for inspire implant.  Patient had inspire implant placement in October 2023.  She continues to have discomfort in her chest and her neck where she has a started tissue.  She also has discomfort in her throat.  She states because she is having constant discomfort, the implant cannot be activated.  She is planning to have implant removed as she had no relief but more discomfort.  Good sleep hygiene was emphasized.  Other fatigue-due to  chronic insomnia.  Primary osteoarthritis of both hands-she continues to have some pain and stiffness.  Joint protection muscle strengthening was discussed.  Trigger thumb, left thumb-improved.  Chondromalacia of both patellae -she continues to have some discomfort in her  knee joints.  No warmth swelling or effusion was noted.  X-rays were consistent with bilateral moderate chondromalacia.  Bilateral knee crepitus noted.  Lower extremity muscle strengthening exercises were discussed.  Osteopenia of multiple sites - December 24, 2021 T-score -1.7, BMD 0.807 right femoral neck.  No comparison was given on the DEXA scan.  Use of calcium rich diet with vitamin D and exercise was emphasized.  History of vitamin D deficiency-she has been taking vitamin D.  Vitamin D was 55.66 on December 31, 2021.  OSA (obstructive sleep apnea)-she continues to suffer from sleep apnea.  She tried CPAP but she could not tolerate in the past.  History of hypertension-blood pressure was well at 137/83.  Other medical problems are listed as follows:  History of hyperglycemia  History of hyperlipidemia  Interstitial cystitis  History of depression  S/P laparoscopic cholecystectomy June 2017  History of IBS  Orders: No orders of the defined types were placed in this encounter.  No orders of the defined types were placed in this encounter.    Follow-Up Instructions: Return in about 6 months (around 01/08/2023) for OA, FMS.   Pollyann SavoyShaili Perle Gibbon, MD  Note - This record has been created using Animal nutritionistDragon software.  Chart creation errors have been sought, but may not always  have been located. Such creation errors do not reflect on  the standard of medical care.

## 2022-07-09 ENCOUNTER — Ambulatory Visit: Payer: Medicare Other | Attending: Rheumatology | Admitting: Rheumatology

## 2022-07-09 ENCOUNTER — Encounter: Payer: Self-pay | Admitting: Rheumatology

## 2022-07-09 VITALS — BP 137/83 | HR 65 | Resp 14 | Ht 64.0 in | Wt 181.0 lb

## 2022-07-09 DIAGNOSIS — R5383 Other fatigue: Secondary | ICD-10-CM

## 2022-07-09 DIAGNOSIS — M8589 Other specified disorders of bone density and structure, multiple sites: Secondary | ICD-10-CM | POA: Diagnosis present

## 2022-07-09 DIAGNOSIS — Z9049 Acquired absence of other specified parts of digestive tract: Secondary | ICD-10-CM | POA: Diagnosis present

## 2022-07-09 DIAGNOSIS — G4733 Obstructive sleep apnea (adult) (pediatric): Secondary | ICD-10-CM

## 2022-07-09 DIAGNOSIS — M19041 Primary osteoarthritis, right hand: Secondary | ICD-10-CM

## 2022-07-09 DIAGNOSIS — Z8679 Personal history of other diseases of the circulatory system: Secondary | ICD-10-CM

## 2022-07-09 DIAGNOSIS — M65312 Trigger thumb, left thumb: Secondary | ICD-10-CM | POA: Diagnosis present

## 2022-07-09 DIAGNOSIS — M797 Fibromyalgia: Secondary | ICD-10-CM

## 2022-07-09 DIAGNOSIS — F5101 Primary insomnia: Secondary | ICD-10-CM

## 2022-07-09 DIAGNOSIS — M2241 Chondromalacia patellae, right knee: Secondary | ICD-10-CM | POA: Diagnosis present

## 2022-07-09 DIAGNOSIS — M19042 Primary osteoarthritis, left hand: Secondary | ICD-10-CM | POA: Insufficient documentation

## 2022-07-09 DIAGNOSIS — Z8639 Personal history of other endocrine, nutritional and metabolic disease: Secondary | ICD-10-CM | POA: Diagnosis present

## 2022-07-09 DIAGNOSIS — Z8719 Personal history of other diseases of the digestive system: Secondary | ICD-10-CM

## 2022-07-09 DIAGNOSIS — M2242 Chondromalacia patellae, left knee: Secondary | ICD-10-CM | POA: Insufficient documentation

## 2022-07-09 DIAGNOSIS — Z8659 Personal history of other mental and behavioral disorders: Secondary | ICD-10-CM | POA: Diagnosis present

## 2022-07-09 DIAGNOSIS — N3010 Interstitial cystitis (chronic) without hematuria: Secondary | ICD-10-CM

## 2022-09-20 ENCOUNTER — Other Ambulatory Visit: Payer: Self-pay | Admitting: Physician Assistant

## 2022-09-20 NOTE — Telephone Encounter (Signed)
Last Fill: 06/25/2022  Next Visit: 01/09/2023  Last Visit: 07/09/2022  Dx: Fibromyalgia   Current Dose per office note on 07/09/2022: Cymbalta 60 mg 1 capsule daily.   Okay to refill Cymbalta?

## 2022-09-23 ENCOUNTER — Ambulatory Visit: Payer: Medicare Other | Admitting: Cardiology

## 2022-11-23 ENCOUNTER — Other Ambulatory Visit: Payer: Self-pay | Admitting: Internal Medicine

## 2022-12-25 ENCOUNTER — Other Ambulatory Visit: Payer: Self-pay | Admitting: Internal Medicine

## 2022-12-25 NOTE — Telephone Encounter (Signed)
Last Fill: 09/20/2022  Next Visit: 01/09/2023  Last Visit: 07/09/2022  Dx: Fibromyalgia   Current Dose per office note on 07/09/2022: Cymbalta 60 mg 1 capsule daily.   Okay to refill Cymbalta?

## 2022-12-30 NOTE — Progress Notes (Signed)
Office Visit Note  Patient: Lindsey [name]             Date of Birth: 06-30-1955           MRN: [id number]             PCP: Judy Pimple, MD Referring: Tower, Audrie Gallus, MD Visit Date: 01/09/2023 Occupation: @GUAROCC @  Subjective:  Generalized pain and fatigue  History of Present Illness: Lindsey [name] is a 67 y.o. female with history of fibromyalgia osteoarthritis and degenerative disc disease.  She states recently she has been having discomfort in her left wrist and left knee.  He has not noticed any swelling.  She is also having generalized pain and discomfort from fibromyalgia.  He continues to have dry mouth and dry eyes.  She states she has been having worsening of her interstitial cystitis symptoms.  She has been followed by Dr. Logan Bores.  She still continues to have headaches but they are less frequent.  She is still having irritation from the inspire implant placement in October 2023.  Although her insomnia has improved.  She states sometimes she has difficulty eating.    Activities of Daily Living:  Patient reports morning stiffness for patient states it varies, 10 minutes.   Patient Reports nocturnal pain.  Difficulty dressing/grooming: Denies Difficulty climbing stairs: Reports Difficulty getting out of chair: Denies Difficulty using hands for taps, buttons, cutlery, and/or writing: Reports  Review of Systems  Constitutional:  Positive for fatigue.  HENT:  Positive for mouth dryness.   Eyes:  Positive for dryness.  Respiratory:  Negative for shortness of breath.   Cardiovascular:  Negative for chest pain and palpitations.  Gastrointestinal:  Positive for constipation. Negative for blood in stool and diarrhea.  Endocrine: Positive for increased urination.  Genitourinary:  Positive for involuntary urination.  Musculoskeletal:  Positive for joint pain, gait problem, joint pain, myalgias, muscle weakness, morning stiffness, muscle tenderness and myalgias. Negative for joint  swelling.  Skin:  Positive for sensitivity to sunlight. Negative for color change, rash and hair loss.  Allergic/Immunologic: Negative for susceptible to infections.  Neurological:  Positive for dizziness and headaches.  Hematological:  Positive for swollen glands.  Psychiatric/Behavioral:  Positive for depressed mood and sleep disturbance. The patient is nervous/anxious.     PMFS History:  Patient Active Problem List   Diagnosis Date Noted   Colon cancer screening 11/06/2020   Elevated TSH 05/29/2020   Prediabetes 08/09/2018   Estrogen deficiency 01/28/2018   Autoimmune disease (HCC) 06/18/2017   History of recurrent cystitis (IC) 08/16/2016   Pelvic floor dysfunction 08/16/2016   Dysuria 05/24/2016   Macular degeneration 04/09/2016   Osteoarthritis, knee 02/15/2016   S/P laparoscopic cholecystectomy June 2017 09/26/2015   Dermatitis 10/31/2014   Encounter for Medicare annual wellness exam 09/04/2012   HSV (herpes simplex virus) infection 04/08/2012   IBS (irritable bowel syndrome) 10/21/2011   Routine general medical examination at a health care facility 07/02/2011   Osteopenia 05/17/2009   Depressed mood 10/05/2008   GERD 05/05/2008   OSA (obstructive sleep apnea) 06/25/2007   Hyperlipidemia 05/31/2007   Essential hypertension 05/31/2007   Other fatigue 04/23/2007   Vitamin D deficiency 01/22/2007   Migraine headache 01/22/2007   Interstitial cystitis 01/22/2007   MENOPAUSE-RELATED VASOMOTOR SYMPTOMS 01/22/2007   OSTEOARTHROSIS, GENERALIZED, MULTIPLE SITES 01/22/2007   Fibromyalgia 01/22/2007   PATENT FORAMEN OVALE 01/22/2007    Past Medical History:  Diagnosis Date   Arthritis    Cataract  bil - not ready for surgery   Chronic fatigue    Early onset macular degeneration    Bilateral   Fibromyalgia    GERD (gastroesophageal reflux disease)    Heart murmur    Hemorrhoids    Hyperglycemia 12/24/2013   Hyperlipidemia    Hypersomnia    Hypertension    IBS  (irritable bowel syndrome)    Interstitial cystitis    Migraines    OSA (obstructive sleep apnea)    Osteopenia    Pelvic floor dysfunction    PFO (patent foramen ovale)    Pneumonia    Pre-diabetes    Tendonitis    hand   Vitamin D deficiency     Family History  Problem Relation Age of Onset   Fibromyalgia Mother    Thyroid disease Mother    Heart attack Father    Diabetes Father    Alcohol abuse Father    Heart disease Father    Colon cancer Paternal Uncle    Diabetes Paternal Grandmother    Stroke Paternal Grandmother    Stomach cancer Paternal Grandfather    Diabetes Paternal Grandfather    Breast cancer Neg Hx    Past Surgical History:  Procedure Laterality Date   ANKLE ARTHROSCOPY Right    ANKLE FRACTURE SURGERY Left    BLADDER SURGERY     and hydrodistension   BREAST EXCISIONAL BIOPSY Bilateral    Multiple Benign Lumps removed     BREAST LUMPECTOMY     many, bilateral   CERVICAL DISCECTOMY  2006   fusion and plating   CHOLECYSTECTOMY N/A 09/26/2015   Procedure: LAPAROSCOPIC CHOLECYSTECTOMY WITH INTRAOPERATIVE CHOLANGIOGRAM;  Surgeon: Luretha Murphy, MD;  Location: [facility] OR[facility];  Service: General;  Laterality: N/A;   COLONOSCOPY     DRUG INDUCED ENDOSCOPY Bilateral 10/17/2021   Procedure: DRUG INDUCED ENDOSCOPY;  Surgeon: Christia Reading, MD;  Location: [facility] OR;  Service: ENT;  Laterality: Bilateral;   FRACTURE SURGERY     ankle fracture x 2   IMPLANTATION OF HYPOGLOSSAL NERVE STIMULATOR Right 01/22/2022   Procedure: IMPLANTATION OF HYPOGLOSSAL NERVE STIMULATOR;  Surgeon: Christia Reading, MD;  Location: [facility];  Service: ENT;  Laterality: Right;   NASAL SINUS SURGERY     multiple, x4   UPPER GASTROINTESTINAL ENDOSCOPY     VAGINAL HYSTERECTOMY  1990   because of the IC partial   Social History   Social History Narrative   Not on file   Immunization History  Administered Date(s) Administered   Influenza Inj Mdck Quad Pf 12/16/2017   Influenza  Whole 12/31/2006   Influenza, High Dose Seasonal PF 12/26/2021   Influenza,inj,Quad PF,6+ Mos 12/24/2013, 02/20/2016, 10/30/2016, 11/30/2016, 11/26/2018   PFIZER(Purple Top)SARS-COV-2 Vaccination 06/18/2019, 07/13/2019, 03/06/2020   PNEUMOCOCCAL CONJUGATE-20 01/07/2022   Pneumococcal Polysaccharide-23 09/04/2012   Td 04/23/2007   Tdap 06/17/2017     Objective: Vital Signs: BP 136/79 (BP Location: Left Arm, Patient Position: Sitting, Cuff Size: Normal)   Pulse 60   Resp 14   Ht 5\' 4"  (1.626 m)   Wt 185 lb (83.9 kg)   BMI 31.76 kg/m    Physical Exam Vitals and nursing note reviewed.  Constitutional:      Appearance: She is well-developed.  HENT:     Head: Normocephalic and atraumatic.  Eyes:     Conjunctiva/sclera: Conjunctivae normal.  Cardiovascular:     Rate and Rhythm: Normal rate and regular rhythm.     Heart sounds: Normal heart sounds.  Pulmonary:  Effort: Pulmonary effort is normal.     Breath sounds: Normal breath sounds.  Abdominal:     General: Bowel sounds are normal.     Palpations: Abdomen is soft.  Musculoskeletal:     Cervical back: Normal range of motion.  Lymphadenopathy:     Cervical: No cervical adenopathy.  Skin:    General: Skin is warm and dry.     Capillary Refill: Capillary refill takes less than 2 seconds.  Neurological:     Mental Status: She is alert and oriented to person, place, and time.  Psychiatric:        Behavior: Behavior normal.      Musculoskeletal Exam: She had limited range of motion of the cervical spine.  There was no tenderness over thoracic or lumbar spine.  Shoulder joints, elbow joints, wrist joints, MCPs PIPs and DIPs were in good range of motion with no synovitis.  She had mild tenderness over the volar aspect of her left forearm.  No inflammation was noted.  Hip joints and knee joints were in good range of motion.  She had mild tenderness over the medial aspect of her left knee.  No warmth swelling or effusion was  noted.  There was no tenderness over MTPs or PIPs.  Generalized hyperalgesia and positive tender points were noted.  CDAI Exam: CDAI Score: -- Patient Global: --; Provider Global: -- Swollen: --; Tender: -- Joint Exam 01/09/2023   No joint exam has been documented for this visit   There is currently no information documented on the homunculus. Go to the Rheumatology activity and complete the homunculus joint exam.  Investigation: No additional findings.  Imaging: No results found.  Recent Labs: Lab Results  Component Value Date   WBC 6.2 12/31/2021   HGB 14.3 12/31/2021   PLT 219.0 12/31/2021   NA 135 04/15/2022   K 4.4 04/15/2022   CL 102 04/15/2022   CO2 25 04/15/2022   GLUCOSE 103 (H) 04/15/2022   BUN 13 04/15/2022   CREATININE 0.83 04/15/2022   BILITOT 0.6 12/31/2021   ALKPHOS 75 12/31/2021   AST 14 12/31/2021   ALT 15 12/31/2021   PROT 6.7 12/31/2021   ALBUMIN 4.0 12/31/2021   CALCIUM 9.5 04/15/2022   GFRAA 98 04/23/2007    Speciality Comments: No specialty comments available.  Procedures:  No procedures performed Allergies: Buspirone hcl, Crestor [rosuvastatin], Simvastatin, Sulfa antibiotics, and Sulfasalazine   Assessment / Plan:     Visit Diagnoses: Fibromyalgia -she continues to have generalized pain and discomfort.  She has had increased pain recently.  She continues to be on Cymbalta 60 mg 1 capsule daily.  She takes baclofen as needed for muscle spasms.  Patient states she could not go for water aerobics as the location at [place] closed.  She does not drive much and does not want to go to another location.  Medication monitoring-she is on Cymbalta 60 mg p.o. daily.  She has not had any labs in 1 year.  I will obtain CBC with differential and CMP with GFR today.  Other fatigue-she continues to have some fatigue related to fibromyalgia.  Primary insomnia -she states her insomnia is somewhat better since she had the implant placement.  Although she  had a lot of discomfort from the implant itself.  Underwent nocturnal polysomnography on 02/14/2020 followed by a home sleep test on 04/19/2020. Patient had inspire implant placement in October 2023.  Primary osteoarthritis of both hands-she complains of some stiffness and discomfort in her  hands.  No swelling was noted.  Muscle strengthening exercises were discussed.  She also complains of some tenderness over the volar aspect of her left forearm.  No swelling or inflammation was noted.  Trigger thumb, left thumb-she consists of intermittent triggering.  Use of topical Voltaren gel was advised.  Chondromalacia of both patellae -she has been having increased pain and discomfort in the left knee joint.  No warmth swelling or effusion was noted.  Previous x-rays were reviewed.  Patient declined cortisone injection.  A handout on lower extremity muscle strengthening exercises were given.  X-rays were consistent with bilateral moderate chondromalacia.  Bilateral knee crepitus noted.  Osteopenia of multiple sites - December 24, 2021 T-score -1.7, BMD 0.807 right femoral neck.  DEXA scan is followed by her PCP.  History of vitamin D deficiency-she has been taking vitamin D.  Her vitamin D was normal in October 2023.  OSA (obstructive sleep apnea)-she noted some improvement with the implant.  History of hypertension-blood pressure was normal at 136/79 today.  History of hyperlipidemia  History of hyperglycemia  Interstitial cystitis-patient Ro is worsening of her IC symptoms despite taking all the measures.  History of depression  S/P laparoscopic cholecystectomy June 2017  History of IBS  Orders: Orders Placed This Encounter  Procedures   CBC with Differential/Platelet   COMPLETE METABOLIC PANEL WITH GFR   No orders of the defined types were placed in this encounter.    Follow-Up Instructions: Return in about 6 months (around 07/10/2023) for Osteoarthritis.   Pollyann Savoy,  MD  Note - This record has been created using Animal nutritionist.  Chart creation errors have been sought, but may not always  have been located. Such creation errors do not reflect on  the standard of medical care.

## 2023-01-03 ENCOUNTER — Other Ambulatory Visit: Payer: Self-pay | Admitting: Family Medicine

## 2023-01-03 DIAGNOSIS — Z1212 Encounter for screening for malignant neoplasm of rectum: Secondary | ICD-10-CM

## 2023-01-03 DIAGNOSIS — Z1211 Encounter for screening for malignant neoplasm of colon: Secondary | ICD-10-CM

## 2023-01-09 ENCOUNTER — Ambulatory Visit: Payer: Medicare Other | Attending: Rheumatology | Admitting: Rheumatology

## 2023-01-09 ENCOUNTER — Encounter: Payer: Self-pay | Admitting: Rheumatology

## 2023-01-09 VITALS — BP 136/79 | HR 60 | Resp 14 | Ht 64.0 in | Wt 185.0 lb

## 2023-01-09 DIAGNOSIS — R5383 Other fatigue: Secondary | ICD-10-CM | POA: Insufficient documentation

## 2023-01-09 DIAGNOSIS — Z8659 Personal history of other mental and behavioral disorders: Secondary | ICD-10-CM | POA: Insufficient documentation

## 2023-01-09 DIAGNOSIS — Z8679 Personal history of other diseases of the circulatory system: Secondary | ICD-10-CM | POA: Insufficient documentation

## 2023-01-09 DIAGNOSIS — M19041 Primary osteoarthritis, right hand: Secondary | ICD-10-CM | POA: Diagnosis not present

## 2023-01-09 DIAGNOSIS — M19042 Primary osteoarthritis, left hand: Secondary | ICD-10-CM | POA: Diagnosis present

## 2023-01-09 DIAGNOSIS — F5101 Primary insomnia: Secondary | ICD-10-CM | POA: Diagnosis present

## 2023-01-09 DIAGNOSIS — N3010 Interstitial cystitis (chronic) without hematuria: Secondary | ICD-10-CM | POA: Insufficient documentation

## 2023-01-09 DIAGNOSIS — Z9049 Acquired absence of other specified parts of digestive tract: Secondary | ICD-10-CM | POA: Diagnosis present

## 2023-01-09 DIAGNOSIS — M797 Fibromyalgia: Secondary | ICD-10-CM | POA: Diagnosis not present

## 2023-01-09 DIAGNOSIS — Z8639 Personal history of other endocrine, nutritional and metabolic disease: Secondary | ICD-10-CM | POA: Diagnosis present

## 2023-01-09 DIAGNOSIS — G4733 Obstructive sleep apnea (adult) (pediatric): Secondary | ICD-10-CM | POA: Diagnosis present

## 2023-01-09 DIAGNOSIS — M2242 Chondromalacia patellae, left knee: Secondary | ICD-10-CM | POA: Insufficient documentation

## 2023-01-09 DIAGNOSIS — Z8719 Personal history of other diseases of the digestive system: Secondary | ICD-10-CM | POA: Diagnosis present

## 2023-01-09 DIAGNOSIS — Z5181 Encounter for therapeutic drug level monitoring: Secondary | ICD-10-CM | POA: Diagnosis present

## 2023-01-09 DIAGNOSIS — M2241 Chondromalacia patellae, right knee: Secondary | ICD-10-CM | POA: Insufficient documentation

## 2023-01-09 DIAGNOSIS — M65312 Trigger thumb, left thumb: Secondary | ICD-10-CM | POA: Diagnosis present

## 2023-01-09 DIAGNOSIS — M8589 Other specified disorders of bone density and structure, multiple sites: Secondary | ICD-10-CM | POA: Insufficient documentation

## 2023-01-09 NOTE — Patient Instructions (Signed)
Exercises for Chronic Knee Pain Chronic knee pain is pain that lasts longer than 3 months. For most people with chronic knee pain, exercise and weight loss is an important part of treatment. Your health care provider may want you to focus on: Making the muscles that support your knee stronger. This can take pressure off your knee and reduce pain. Preventing knee stiffness. How far you can move your knee, keeping it there or making it farther. Losing weight (if this applies) to take pressure off your knee, lower your risk for injury, and make it easier for you to exercise. Your provider will help you make an exercise program that fits your needs and physical abilities. Below are simple, low-impact exercises you can do at home. Ask your provider or physical therapist how often you should do your exercise program and how many times to repeat each exercise. General safety tips  Get your provider's approval before doing any exercises. Start slowly and stop any time you feel pain. Do not exercise if your knee pain is flaring up. Warm up first. Stretching a cold muscle can cause an injury. Do 5-10 minutes of easy movement or light stretching before beginning your exercises. Do 5-10 minutes of low-impact activity (like walking or cycling) before starting strengthening exercises. Contact your provider any time you have pain during or after exercising. Exercise can cause discomfort but should not be painful. It is normal to be a little stiff or sore after exercising. Stretching and range-of-motion exercises Front thigh stretch  Stand up straight and support your body by holding on to a chair or resting one hand on a wall. With your legs straight and close together, bend one knee to lift your heel up toward your butt. Using one hand for support, grab your ankle with your free hand. Pull your foot up closer toward your butt to feel the stretch in front of your thigh. Hold the stretch for 30  seconds. Repeat __________ times. Complete this exercise __________ times a day. Back thigh stretch  Sit on the floor with your back straight and your legs out straight in front of you. Place the palms of your hands on the floor and slide them toward your feet as you bend at the hip. Try to touch your nose to your knees and feel the stretch in the back of your thighs. Hold for 30 seconds. Repeat __________ times. Complete this exercise __________ times a day. Calf stretch  Stand facing a wall. Place the palms of your hands flat against the wall, arms extended, and lean slightly against the wall. Get into a lunge position with one leg bent at the knee and the other leg stretched out straight behind you. Keep both feet facing the wall and increase the bend in your knee while keeping the heel of the other leg flat on the ground. You should feel the stretch in your calf. Hold for 30 seconds. Repeat __________ times. Complete this exercise __________ times a day. Strengthening exercises Straight leg lift  Lie on your back with one knee bent and the other leg out straight. Slowly lift the straight leg without bending the knee. Lift until your foot is about 12 inches (30 cm) off the floor. Hold for 3-5 seconds and slowly lower your leg. Repeat __________ times. Complete this exercise __________ times a day. Single leg dip  Stand between two chairs and put both hands on the backs of the chairs for support. Extend one leg out straight with your body   weight resting on the heel of the standing leg. Slowly bend your standing knee to dip your body to the level that is comfortable for you. Hold for 3-5 seconds. Repeat __________ times. Complete this exercise __________ times a day. Hamstring curls  Stand straight, knees close together, facing the back of a chair. Hold on to the back of a chair with both hands. Keep one leg straight. Bend the other knee while bringing the heel up toward the butt  until the knee is bent at a 90-degree angle (right angle). Hold for 3-5 seconds. Repeat __________ times. Complete this exercise __________ times a day. Wall squat  Stand straight with your back, hips, and head against a wall. Step forward one foot at a time with your back still against the wall. Your feet should be 2 feet (61 cm) from the wall at shoulder width. Keeping your back, hips, and head against the wall, slide down the wall to as close to a sitting position as you can get. Hold for 5-10 seconds, then slowly slide back up. Repeat __________ times. Complete this exercise __________ times a day. Step-ups  Stand in front of a sturdy platform or stool that is about 6 inches (15 cm) high. Slowly step up with your left / right foot, keeping your knee in line with your hip and foot. Do not let your knee bend so far that you cannot see your toes. Hold on to a chair for balance, but do not use it for support. Slowly unlock your knee and lower yourself to the starting position. Repeat __________ times. Complete this exercise __________ times a day. Contact a health care provider if: Your exercises cause pain. Your pain is worse after you exercise. Your pain prevents you from doing your exercises. This information is not intended to replace advice given to you by your health care provider. Make sure you discuss any questions you have with your health care provider. Document Revised: 04/02/2022 Document Reviewed: 04/02/2022 Elsevier Patient Education  2024 [employer]  

## 2023-01-10 LAB — COMPLETE METABOLIC PANEL WITH GFR
AG Ratio: 1.4 (calc) (ref 1.0–2.5)
ALT: 11 U/L (ref 6–29)
AST: 14 U/L (ref 10–35)
Albumin: 4.2 g/dL (ref 3.6–5.1)
Alkaline phosphatase (APISO): 78 U/L (ref 37–153)
BUN: 15 mg/dL (ref 7–25)
CO2: 27 mmol/L (ref 20–32)
Calcium: 9.9 mg/dL (ref 8.6–10.4)
Chloride: 100 mmol/L (ref 98–110)
Creat: 0.89 mg/dL (ref 0.50–1.05)
Globulin: 2.9 g/dL (ref 1.9–3.7)
Glucose, Bld: 100 mg/dL — ABNORMAL HIGH (ref 65–99)
Potassium: 4.4 mmol/L (ref 3.5–5.3)
Sodium: 137 mmol/L (ref 135–146)
Total Bilirubin: 0.6 mg/dL (ref 0.2–1.2)
Total Protein: 7.1 g/dL (ref 6.1–8.1)
eGFR: 71 mL/min/{1.73_m2} (ref >=60)

## 2023-01-10 LAB — CBC WITH DIFFERENTIAL/PLATELET
Absolute Monocytes: 461 {cells}/uL (ref 200–950)
Basophils Absolute: 29 {cells}/uL (ref 0–200)
Basophils Relative: 0.4 %
Eosinophils Absolute: 130 {cells}/uL (ref 15–500)
Eosinophils Relative: 1.8 %
HCT: 45.5 % — ABNORMAL HIGH (ref 35.0–45.0)
Hemoglobin: 15.1 g/dL (ref 11.7–15.5)
Lymphs Abs: 2520 {cells}/uL (ref 850–3900)
MCH: 29.5 pg (ref 27.0–33.0)
MCHC: 33.2 g/dL (ref 32.0–36.0)
MCV: 89 fL (ref 80.0–100.0)
MPV: 11.4 fL (ref 7.5–12.5)
Monocytes Relative: 6.4 %
Neutro Abs: 4061 {cells}/uL (ref 1500–7800)
Neutrophils Relative %: 56.4 %
Platelets: 263 10*3/uL (ref 140–400)
RBC: 5.11 10*6/uL — ABNORMAL HIGH (ref 3.80–5.10)
RDW: 13 % (ref 11.0–15.0)
Total Lymphocyte: 35 %
WBC: 7.2 10*3/uL (ref 3.8–10.8)

## 2023-01-10 NOTE — Progress Notes (Signed)
CBC and CMP are stable.

## 2023-01-23 ENCOUNTER — Other Ambulatory Visit: Payer: Self-pay | Admitting: Family Medicine

## 2023-01-24 LAB — COLOGUARD: COLOGUARD: POSITIVE — AB

## 2023-01-24 NOTE — Telephone Encounter (Signed)
Pt hasn't been seen in over a year. Please schedule CPE (labs prior) and then route back to me, thanks

## 2023-01-24 NOTE — Telephone Encounter (Signed)
Patient has been scheduled

## 2023-01-27 ENCOUNTER — Other Ambulatory Visit: Payer: Self-pay

## 2023-01-27 ENCOUNTER — Encounter: Payer: Self-pay | Admitting: Family Medicine

## 2023-01-27 DIAGNOSIS — Z1211 Encounter for screening for malignant neoplasm of colon: Secondary | ICD-10-CM

## 2023-01-27 DIAGNOSIS — R195 Other fecal abnormalities: Secondary | ICD-10-CM

## 2023-01-30 ENCOUNTER — Encounter: Payer: Self-pay | Admitting: Internal Medicine

## 2023-01-30 ENCOUNTER — Other Ambulatory Visit: Payer: Self-pay | Admitting: Family Medicine

## 2023-01-30 DIAGNOSIS — Z1231 Encounter for screening mammogram for malignant neoplasm of breast: Secondary | ICD-10-CM

## 2023-02-04 ENCOUNTER — Telehealth: Payer: Self-pay | Admitting: Family Medicine

## 2023-02-04 DIAGNOSIS — I10 Essential (primary) hypertension: Secondary | ICD-10-CM

## 2023-02-04 DIAGNOSIS — E559 Vitamin D deficiency, unspecified: Secondary | ICD-10-CM

## 2023-02-04 DIAGNOSIS — E7800 Pure hypercholesterolemia, unspecified: Secondary | ICD-10-CM

## 2023-02-04 DIAGNOSIS — R7303 Prediabetes: Secondary | ICD-10-CM

## 2023-02-04 NOTE — Telephone Encounter (Signed)
-----   Message from Vincenza Hews sent at 01/27/2023  1:47 PM EDT ----- Regarding: Labs Wed. 02/05/23 Hello,  Patient is coming in for CPE labs on Wednesday 02/05/23. Can we get orders please.   Thanks

## 2023-02-05 ENCOUNTER — Other Ambulatory Visit (INDEPENDENT_AMBULATORY_CARE_PROVIDER_SITE_OTHER): Payer: Medicare Other

## 2023-02-05 DIAGNOSIS — E559 Vitamin D deficiency, unspecified: Secondary | ICD-10-CM

## 2023-02-05 DIAGNOSIS — R7303 Prediabetes: Secondary | ICD-10-CM

## 2023-02-05 DIAGNOSIS — E7800 Pure hypercholesterolemia, unspecified: Secondary | ICD-10-CM | POA: Diagnosis not present

## 2023-02-05 DIAGNOSIS — I10 Essential (primary) hypertension: Secondary | ICD-10-CM

## 2023-02-05 LAB — COMPREHENSIVE METABOLIC PANEL
ALT: 14 U/L (ref 0–35)
AST: 18 U/L (ref 0–37)
Albumin: 4 g/dL (ref 3.5–5.2)
Alkaline Phosphatase: 77 U/L (ref 39–117)
BUN: 19 mg/dL (ref 6–23)
CO2: 33 meq/L — ABNORMAL HIGH (ref 19–32)
Calcium: 9.5 mg/dL (ref 8.4–10.5)
Chloride: 99 meq/L (ref 96–112)
Creatinine, Ser: 0.89 mg/dL (ref 0.40–1.20)
GFR: 67.62 mL/min (ref >60.00)
Glucose, Bld: 118 mg/dL — ABNORMAL HIGH (ref 70–99)
Potassium: 5.2 meq/L — ABNORMAL HIGH (ref 3.5–5.1)
Sodium: 137 meq/L (ref 135–145)
Total Bilirubin: 0.9 mg/dL (ref 0.2–1.2)
Total Protein: 6.9 g/dL (ref 6.0–8.3)

## 2023-02-05 LAB — CBC WITH DIFFERENTIAL/PLATELET
Basophils Absolute: 0 10*3/uL (ref 0.0–0.1)
Basophils Relative: 0.7 % (ref 0.0–3.0)
Eosinophils Absolute: 0.2 10*3/uL (ref 0.0–0.7)
Eosinophils Relative: 2.2 % (ref 0.0–5.0)
HCT: 45.1 % (ref 36.0–46.0)
Hemoglobin: 14.6 g/dL (ref 12.0–15.0)
Lymphocytes Relative: 34.4 % (ref 12.0–46.0)
Lymphs Abs: 2.4 10*3/uL (ref 0.7–4.0)
MCHC: 32.5 g/dL (ref 30.0–36.0)
MCV: 90 fL (ref 78.0–100.0)
Monocytes Absolute: 0.6 10*3/uL (ref 0.1–1.0)
Monocytes Relative: 7.9 % (ref 3.0–12.0)
Neutro Abs: 3.8 10*3/uL (ref 1.4–7.7)
Neutrophils Relative %: 54.8 % (ref 43.0–77.0)
Platelets: 266 10*3/uL (ref 150.0–400.0)
RBC: 5.01 Mil/uL (ref 3.87–5.11)
RDW: 14.2 % (ref 11.5–15.5)
WBC: 7 10*3/uL (ref 4.0–10.5)

## 2023-02-05 LAB — LIPID PANEL
Cholesterol: 223 mg/dL — ABNORMAL HIGH (ref 0–200)
HDL: 50.8 mg/dL (ref >39.00)
LDL Cholesterol: 141 mg/dL — ABNORMAL HIGH (ref 0–99)
NonHDL: 171.78
Total CHOL/HDL Ratio: 4
Triglycerides: 154 mg/dL — ABNORMAL HIGH (ref 0.0–149.0)
VLDL: 30.8 mg/dL (ref 0.0–40.0)

## 2023-02-05 LAB — VITAMIN D 25 HYDROXY (VIT D DEFICIENCY, FRACTURES): VITD: 44.07 ng/mL (ref 30.00–100.00)

## 2023-02-05 LAB — TSH: TSH: 7.01 u[IU]/mL — ABNORMAL HIGH (ref 0.35–5.50)

## 2023-02-05 LAB — HEMOGLOBIN A1C: Hgb A1c MFr Bld: 6.2 % (ref 4.6–6.5)

## 2023-02-11 NOTE — Progress Notes (Unsigned)
Subjective:    Patient ID: Lindsey [name], female    DOB: [birth year], 67 y.o.   MRN: [id number]  HPI  Here for annual follow up of chronic medical problems   Wt Readings from Last 3 Encounters:  02/12/23 186 lb 3.2 oz (84.5 kg)  01/09/23 185 lb (83.9 kg)  07/09/22 181 lb (82.1 kg)   32.47 kg/m  Vitals:   02/12/23 1500  BP: 118/74  Pulse: 63  Temp: 98.2 F (36.8 C)  SpO2: 97%    Immunization History  Administered Date(s) Administered   Influenza Inj Mdck Quad Pf 12/16/2017   Influenza Whole 12/31/2006   Influenza, High Dose Seasonal PF 12/26/2021   Influenza,inj,Quad PF,6+ Mos 12/24/2013, 02/20/2016, 10/30/2016, 11/30/2016, 11/26/2018   Influenza-Unspecified 12/18/2022   PFIZER(Purple Top)SARS-COV-2 Vaccination 06/18/2019, 07/13/2019, 03/06/2020   PNEUMOCOCCAL CONJUGATE-20 01/07/2022   Pneumococcal Polysaccharide-23 09/04/2012   Td 04/23/2007   Tdap 06/17/2017    Health Maintenance Due  Topic Date Due   Medicare Annual Wellness (AWV)  03/05/2023   Flu shot - had at [employer] vaccine - interested    Mammogram 11/2021 Has appointment on 02/26/23 for next one  Self breast exam- no lumps    Gyn health Has IC and pelvic floor dysfunction  No gyn visit recently  No history of abn paps  No vaginal problem   Colon cancer screening  cologuard positive 12/2022  Has appointment with GI for colonoscopy December 10 th   Bone health  Dexa 11/2021  osteopenia  Falls- one fall several months ago , may have tripped (bedroom shoes)  Fractures-none  Supplements  Last vitamin D Lab Results  Component Value Date   VD25OH 44.07 02/05/2023    Exercise  Very limited due to fibro/ severe pain     Mood    02/12/2023    3:02 PM 03/04/2022   11:53 AM 02/28/2021   12:06 PM 11/30/2019    2:44 PM 08/25/2019    1:13 PM  Depression screen PHQ 2/9  Decreased Interest 3 2 0 3 0  Down, Depressed, Hopeless 2 0 0 1 0  PHQ - 2 Score 5 2 0 4 0  Altered sleeping  3  3  0  Tired, decreased energy  3  3 0  Change in appetite  0  3 0  Feeling bad or failure about yourself   0  3 0  Trouble concentrating  2  3 0  Moving slowly or fidgety/restless  0  1 0  Suicidal thoughts    0 0  PHQ-9 Score  10  20 0  Difficult doing work/chores Somewhat difficult Not difficult at all  Extremely dIfficult Not difficult at all  Cymbalta 60     HTN bp is stable today  No cp or palpitations or headaches or edema  No side effects to medicines  BP Readings from Last 3 Encounters:  02/12/23 118/74  01/09/23 136/79  07/09/22 137/83    Atenolol 100 mg daily (also helps migraines)   Lab Results  Component Value Date   NA 137 02/05/2023   K 5.2 No hemolysis seen (H) 02/05/2023   CO2 33 (H) 02/05/2023   GLUCOSE 118 (H) 02/05/2023   BUN 19 02/05/2023   CREATININE 0.89 02/05/2023   CALCIUM 9.5 02/05/2023   GFR 67.62 02/05/2023   EGFR 71 01/09/2023   GFRNONAA >60 04/15/2022   K was up  Takes nsaid-ibuprofen / not all the time   Pulse Readings from Last  3 Encounters:  02/12/23 63  01/09/23 60  07/09/22 65    OSA Tried the inspire implant  Is sleeping better at night  A lot of pain from implant -neck and face / since then  Is planning some nerve blocks    GERD Pepcid 20 mg prn  Struggles with that   Elevated TSH in past Lab Results  Component Value Date   TSH 7.01 (H) 02/05/2023   Mother has hypothyroidism    Hyperlipidemia Lab Results  Component Value Date   CHOL 223 (H) 02/05/2023   CHOL 200 12/31/2021   CHOL 194 12/19/2020   Lab Results  Component Value Date   HDL 50.80 02/05/2023   HDL 45.50 12/31/2021   HDL 51.50 12/19/2020   Lab Results  Component Value Date   LDLCALC 141 (H) 02/05/2023   LDLCALC 120 (H) 12/31/2021   LDLCALC 117 (H) 12/19/2020   Lab Results  Component Value Date   TRIG 154.0 (H) 02/05/2023   TRIG 171.0 (H) 12/31/2021   TRIG 129.0 12/19/2020   Lab Results  Component Value Date   CHOLHDL 4 02/05/2023    CHOLHDL 4 12/31/2021   CHOLHDL 4 12/19/2020   Lab Results  Component Value Date   LDLDIRECT 137.4 11/27/2012   LDLDIRECT 166.1 07/24/2012   LDLDIRECT 161.5 07/03/2011   Intol of statins  Zetia 10 mg daily  Diet is not changed  Husband cooks and he gets things he wants    Prediabetes Lab Results  Component Value Date   HGBA1C 6.2 02/05/2023  This is stable from a year ago   Fibromyalgia is worse in general  Barely able to function     Patient Active Problem List   Diagnosis Date Noted   Hyperkalemia 02/12/2023   Colon cancer screening 11/06/2020   Subclinical hypothyroidism 05/29/2020   Prediabetes 08/09/2018   Estrogen deficiency 01/28/2018   Autoimmune disease (HCC) 06/18/2017   History of recurrent cystitis (IC) 08/16/2016   Pelvic floor dysfunction 08/16/2016   Dysuria 05/24/2016   Macular degeneration 04/09/2016   Osteoarthritis, knee 02/15/2016   S/P laparoscopic cholecystectomy June 2017 09/26/2015   Dermatitis 10/31/2014   Encounter for Medicare annual wellness exam 09/04/2012   HSV (herpes simplex virus) infection 04/08/2012   IBS (irritable bowel syndrome) 10/21/2011   Routine general medical examination at a health care facility 07/02/2011   Osteopenia 05/17/2009   Depressed mood 10/05/2008   GERD 05/05/2008   OSA (obstructive sleep apnea) 06/25/2007   Hyperlipidemia 05/31/2007   Essential hypertension 05/31/2007   Other fatigue 04/23/2007   Vitamin D deficiency 01/22/2007   Migraine headache 01/22/2007   Interstitial cystitis 01/22/2007   MENOPAUSE-RELATED VASOMOTOR SYMPTOMS 01/22/2007   OSTEOARTHROSIS, GENERALIZED, MULTIPLE SITES 01/22/2007   Fibromyalgia 01/22/2007   PATENT FORAMEN OVALE 01/22/2007   Past Medical History:  Diagnosis Date   Arthritis    Cataract    bil - not ready for surgery   Chronic fatigue    Early onset macular degeneration    Bilateral   Fibromyalgia    GERD (gastroesophageal reflux disease)    Heart murmur     Hemorrhoids    Hyperglycemia 12/24/2013   Hyperlipidemia    Hypersomnia    Hypertension    IBS (irritable bowel syndrome)    Interstitial cystitis    Migraines    OSA (obstructive sleep apnea)    Osteopenia    Pelvic floor dysfunction    PFO (patent foramen ovale)    Pneumonia    Pre-diabetes  Tendonitis    hand   Vitamin D deficiency    Past Surgical History:  Procedure Laterality Date   ANKLE ARTHROSCOPY Right    ANKLE FRACTURE SURGERY Left    BLADDER SURGERY     and hydrodistension   BREAST EXCISIONAL BIOPSY Bilateral    Multiple Benign Lumps removed     BREAST LUMPECTOMY     many, bilateral   CERVICAL DISCECTOMY  2006   fusion and plating   CHOLECYSTECTOMY N/A 09/26/2015   Procedure: LAPAROSCOPIC CHOLECYSTECTOMY WITH INTRAOPERATIVE CHOLANGIOGRAM;  Surgeon: Luretha Murphy, MD;  Location: [facility];  Service: General;  Laterality: N/A;   COLONOSCOPY     DRUG INDUCED ENDOSCOPY Bilateral 10/17/2021   Procedure: DRUG INDUCED ENDOSCOPY;  Surgeon: Christia Reading, MD;  Location: [facility];  Service: ENT;  Laterality: Bilateral;   FRACTURE SURGERY     ankle fracture x 2   IMPLANTATION OF HYPOGLOSSAL NERVE STIMULATOR Right 01/22/2022   Procedure: IMPLANTATION OF HYPOGLOSSAL NERVE STIMULATOR;  Surgeon: Christia Reading, MD;  Location: [facility];  Service: ENT;  Laterality: Right;   NASAL SINUS SURGERY     multiple, x4   UPPER GASTROINTESTINAL ENDOSCOPY     VAGINAL HYSTERECTOMY  1990   because of the IC partial   Social History   Tobacco Use   Smoking status: Never    Passive exposure: Past   Smokeless tobacco: Never  Vaping Use   Vaping status: Never Used  Substance Use Topics   Alcohol use: No    Alcohol/week: 0.0 standard drinks of alcohol   Drug use: No   Family History  Problem Relation Age of Onset   Fibromyalgia Mother    Thyroid disease Mother    Heart attack Father    Diabetes Father    Alcohol abuse Father    Heart disease Father    Colon  cancer Paternal Uncle    Diabetes Paternal Grandmother    Stroke Paternal Grandmother    Stomach cancer Paternal Grandfather    Diabetes Paternal Grandfather    Breast cancer Neg Hx    Allergies  Allergen Reactions   Buspirone Hcl Other (See Comments)   Crestor [Rosuvastatin]     Muscle pain     Simvastatin     REACTION: malaise   Sulfa Antibiotics Other (See Comments)    nausea   Sulfasalazine Nausea And Vomiting    nausea   Current Outpatient Medications on File Prior to Visit  Medication Sig Dispense Refill   atenolol (TENORMIN) 100 MG tablet TAKE 1 TABLET BY MOUTH EVERY DAY 90 tablet 0   baclofen (LIORESAL) 20 MG tablet Take 20 mg by mouth daily as needed for muscle spasms (as needed).     botulinum toxin Type A (BOTOX) 100 units SOLR injection Inject 200 Units into the muscle every 3 (three) months.     calcium carbonate (TUMS - DOSED IN MG ELEMENTAL CALCIUM) 500 MG chewable tablet Chew 1-2 tablets by mouth 2 (two) times daily as needed for indigestion or heartburn.     Cholecalciferol (VITAMIN D-3) 125 MCG (5000 UT) TABS Take 5,000 Units by mouth every evening.     diclofenac sodium (VOLTAREN) 1 % GEL Apply 2 to 4 gram to affected joints up to 4 times daily 400 g 2   docusate sodium (COLACE) 100 MG capsule Take 200 mg by mouth at bedtime.     DULoxetine (CYMBALTA) 60 MG capsule TAKE 1 CAPSULE BY MOUTH EVERY DAY 90 capsule 0  ezetimibe (ZETIA) 10 MG tablet TAKE 1 TABLET BY MOUTH EVERY DAY 90 tablet 0   famotidine (PEPCID) 20 MG tablet Take 20 mg by mouth daily as needed for heartburn or indigestion.     fentaNYL (DURAGESIC - DOSED MCG/HR) 50 MCG/HR Place 50 mcg onto the skin every other day.     hydrOXYzine (ATARAX/VISTARIL) 50 MG tablet Take 50 mg by mouth at bedtime.     ibuprofen (ADVIL) 200 MG tablet Take 400 mg by mouth every 8 (eight) hours as needed (pain.).     lidocaine (XYLOCAINE) 5 % ointment Apply 1 Application topically daily as needed (pain.).     LUTEIN PO  Take 40 mg by mouth at bedtime.     magnesium oxide (MAG-OX) 400 (240 Mg) MG tablet Take 400 mg by mouth at bedtime.     naloxone (NARCAN) nasal spray 4 mg/0.1 mL Place into the nose.     polyethylene glycol powder (GLYCOLAX/MIRALAX) 17 GM/SCOOP powder Take 17 g by mouth daily as needed (constipation.).     No current facility-administered medications on file prior to visit.    Review of Systems  Constitutional:  Positive for fatigue. Negative for activity change, appetite change, fever and unexpected weight change.  HENT:  Negative for congestion, ear pain, rhinorrhea, sinus pressure and sore throat.   Eyes:  Negative for pain, redness and visual disturbance.  Respiratory:  Negative for cough, shortness of breath and wheezing.   Cardiovascular:  Negative for chest pain and palpitations.  Gastrointestinal:  Negative for abdominal pain, blood in stool, constipation and diarrhea.       GERD symptoms Indigestion   Endocrine: Negative for polydipsia and polyuria.  Genitourinary:  Positive for dysuria and pelvic pain. Negative for frequency and urgency.       IC chronic pain   Musculoskeletal:  Positive for arthralgias, back pain, myalgias and neck pain. Negative for joint swelling.  Skin:  Negative for pallor and rash.  Allergic/Immunologic: Negative for environmental allergies.  Neurological:  Positive for headaches. Negative for dizziness and syncope.  Hematological:  Negative for adenopathy. Does not bruise/bleed easily.  Psychiatric/Behavioral:  Positive for dysphoric mood and sleep disturbance. Negative for decreased concentration. The patient is not nervous/anxious.        Objective:   Physical Exam Constitutional:      General: She is not in acute distress.    Appearance: Normal appearance. She is well-developed. She is obese. She is not ill-appearing or diaphoretic.  HENT:     Head: Normocephalic and atraumatic.     Right Ear: Tympanic membrane, ear canal and external ear  normal.     Left Ear: Tympanic membrane, ear canal and external ear normal.     Nose: Nose normal. No congestion.     Mouth/Throat:     Mouth: Mucous membranes are moist.     Pharynx: Oropharynx is clear. No posterior oropharyngeal erythema.  Eyes:     General: No scleral icterus.    Extraocular Movements: Extraocular movements intact.     Conjunctiva/sclera: Conjunctivae normal.     Pupils: Pupils are equal, round, and reactive to light.  Neck:     Thyroid: No thyromegaly.     Vascular: No carotid bruit or JVD.  Cardiovascular:     Rate and Rhythm: Normal rate and regular rhythm.     Pulses: Normal pulses.     Heart sounds: Normal heart sounds.     No gallop.  Pulmonary:     Effort: Pulmonary  effort is normal. No respiratory distress.     Breath sounds: Normal breath sounds. No wheezing.     Comments: Good air exch Chest:     Chest wall: No tenderness.  Abdominal:     General: Bowel sounds are normal. There is no distension or abdominal bruit.     Palpations: Abdomen is soft. There is no mass.     Tenderness: There is no abdominal tenderness.     Hernia: No hernia is present.  Musculoskeletal:        General: No tenderness. Normal range of motion.     Cervical back: Normal range of motion and neck supple. No rigidity. No muscular tenderness.     Right lower leg: No edema.     Left lower leg: No edema.     Comments: No kyphosis   Lymphadenopathy:     Cervical: No cervical adenopathy.  Skin:    General: Skin is warm and dry.     Coloration: Skin is not pale.     Findings: No erythema or rash.     Comments: Solar lentigines diffusely   Neurological:     Mental Status: She is alert. Mental status is at baseline.     Cranial Nerves: No cranial nerve deficit.     Motor: No abnormal muscle tone.     Coordination: Coordination normal.     Gait: Gait normal.     Deep Tendon Reflexes: Reflexes are normal and symmetric. Reflexes normal.  Psychiatric:        Attention and  Perception: Attention normal.        Mood and Affect: Mood is depressed. Affect is not flat or tearful.        Behavior: Behavior is slowed.        Cognition and Memory: Cognition and memory normal.     Comments: Candidly discusses symptoms and stressors             Assessment & Plan:   Problem List Items Addressed This Visit       Cardiovascular and Mediastinum   Essential hypertension - Primary    bp in fair control at this time  BP Readings from Last 1 Encounters:  02/12/23 118/74   No changes needed Most recent labs reviewed  Disc lifstyle change with low sodium diet and exercise  Atenolol 100 mg daily  Pulse rate of 60          Respiratory   OSA (obstructive sleep apnea)    Aspire device is helping sleep but pt has suffered pain from scars/surgery          Digestive   GERD    Pepcid 20 mg prn  Still struggles  Has seen GI Encouraged to watch diet          Endocrine   Subclinical hypothyroidism    Lab Results  Component Value Date   TSH 7.01 (H) 02/05/2023    Tsh repeat with FT4 today  Is sluggish       Relevant Orders   TSH   T4, free     Musculoskeletal and Integument   Osteopenia    Dexa 11/2021 One fall /no fracture  Discussed fall prevention, supplements and exercise for bone density   Exercise limited due to chronic pain         Other   Autoimmune disease (HCC)   Colon cancer screening   Fibromyalgia    Unfortunately worse  Continues rheum care for this and CT dz  Hyperkalemia    Lab Results  Component Value Date   K 5.2 No hemolysis seen (H) 02/05/2023   Re check today Takes occational nsaid       Relevant Orders   Basic metabolic panel   Hyperlipidemia    Disc goals for lipids and reasons to control them Rev last labs with pt Rev low sat fat diet in detail LDL up to 141 Diet is sub optimal /plans to change (husband does cooking)  Continues zetia 10 mg daily  Intolerant of statins in setting of  fibromyalgia       Prediabetes    Lab Results  Component Value Date   HGBA1C 6.2 02/05/2023   disc imp of low glycemic diet and wt loss to prevent DM2       Vitamin D deficiency    Last vitamin D Lab Results  Component Value Date   VD25OH 44.07 02/05/2023   Vitamin D level is therapeutic with current supplementation Disc importance of this to bone and overall health

## 2023-02-12 ENCOUNTER — Ambulatory Visit (INDEPENDENT_AMBULATORY_CARE_PROVIDER_SITE_OTHER): Payer: Medicare Other | Admitting: Family Medicine

## 2023-02-12 VITALS — BP 118/74 | HR 63 | Temp 98.2°F | Ht 63.5 in | Wt 186.2 lb

## 2023-02-12 DIAGNOSIS — Z0000 Encounter for general adult medical examination without abnormal findings: Secondary | ICD-10-CM | POA: Diagnosis not present

## 2023-02-12 DIAGNOSIS — R7303 Prediabetes: Secondary | ICD-10-CM

## 2023-02-12 DIAGNOSIS — K219 Gastro-esophageal reflux disease without esophagitis: Secondary | ICD-10-CM

## 2023-02-12 DIAGNOSIS — E038 Other specified hypothyroidism: Secondary | ICD-10-CM

## 2023-02-12 DIAGNOSIS — M359 Systemic involvement of connective tissue, unspecified: Secondary | ICD-10-CM | POA: Diagnosis not present

## 2023-02-12 DIAGNOSIS — M8589 Other specified disorders of bone density and structure, multiple sites: Secondary | ICD-10-CM | POA: Diagnosis not present

## 2023-02-12 DIAGNOSIS — M797 Fibromyalgia: Secondary | ICD-10-CM

## 2023-02-12 DIAGNOSIS — I10 Essential (primary) hypertension: Secondary | ICD-10-CM | POA: Diagnosis not present

## 2023-02-12 DIAGNOSIS — E7800 Pure hypercholesterolemia, unspecified: Secondary | ICD-10-CM

## 2023-02-12 DIAGNOSIS — R7989 Other specified abnormal findings of blood chemistry: Secondary | ICD-10-CM

## 2023-02-12 DIAGNOSIS — E875 Hyperkalemia: Secondary | ICD-10-CM

## 2023-02-12 DIAGNOSIS — E559 Vitamin D deficiency, unspecified: Secondary | ICD-10-CM

## 2023-02-12 DIAGNOSIS — R4589 Other symptoms and signs involving emotional state: Secondary | ICD-10-CM

## 2023-02-12 DIAGNOSIS — Z1211 Encounter for screening for malignant neoplasm of colon: Secondary | ICD-10-CM

## 2023-02-12 DIAGNOSIS — G4733 Obstructive sleep apnea (adult) (pediatric): Secondary | ICD-10-CM

## 2023-02-12 NOTE — Assessment & Plan Note (Signed)
Pepcid 20 mg prn  Still struggles  Has seen GI Encouraged to watch diet

## 2023-02-12 NOTE — Assessment & Plan Note (Addendum)
bp in fair control at this time  BP Readings from Last 1 Encounters:  02/12/23 118/74   No changes needed Most recent labs reviewed  Disc lifstyle change with low sodium diet and exercise  Atenolol 100 mg daily  Pulse rate of 63

## 2023-02-12 NOTE — Assessment & Plan Note (Signed)
Lab Results  Component Value Date   HGBA1C 6.2 02/05/2023   disc imp of low glycemic diet and wt loss to prevent DM2

## 2023-02-12 NOTE — Assessment & Plan Note (Signed)
Aspire device is helping sleep but pt has suffered pain from scars/surgery

## 2023-02-12 NOTE — Assessment & Plan Note (Signed)
Cologuard positive last month Has colonoscopy planned early in December

## 2023-02-12 NOTE — Assessment & Plan Note (Signed)
Lab Results  Component Value Date   K 5.2 No hemolysis seen (H) 02/05/2023   Re check today Takes occational nsaid

## 2023-02-12 NOTE — Assessment & Plan Note (Signed)
Lab Results  Component Value Date   TSH 7.01 (H) 02/05/2023    Tsh repeat with FT4 today  Is sluggish

## 2023-02-12 NOTE — Assessment & Plan Note (Signed)
In setting of chronic pain  Continues cymbalta 60 mg daily from rheum

## 2023-02-12 NOTE — Patient Instructions (Addendum)
If you are interested in the shingles vaccine series (Shingrix), call your insurance or pharmacy to check on coverage and location it must be given.  If affordable - you can schedule it here or at your pharmacy depending on coverage   Covid shot is available at the pharmacy   Continue to consider water exercise   For cholesterol Avoid red meat/ fried foods/ egg yolks/ fatty breakfast meats/ butter, cheese and high fat dairy/ and shellfish    To prevent diabetes Try to get most of your carbohydrates from produce (with the exception of white potatoes) and whole grains Eat less bread/pasta/rice/snack foods/cereals/sweets and other items from the middle of the grocery store (processed carbs)   Labs for thyroid and potassium today        Mediterranean Diet  Why follow it? Research shows. Those who follow the Mediterranean diet have a reduced risk of heart disease  The diet is associated with a reduced incidence of Parkinson's and Alzheimer's diseases People following the diet may have longer life expectancies and lower rates of chronic diseases  The Dietary Guidelines for Americans recommends the Mediterranean diet as an eating plan to promote health and prevent disease  What Is the Mediterranean Diet?  Healthy eating plan based on typical foods and recipes of Mediterranean-style cooking The diet is primarily a plant based diet; these foods should make up a majority of meals   Starches - Plant based foods should make up a majority of meals - They are an important sources of vitamins, minerals, energy, antioxidants, and fiber - Choose whole grains, foods high in fiber and minimally processed items  - Typical grain sources include wheat, oats, barley, corn, brown rice, bulgar, farro, millet, polenta, couscous  - Various types of beans include chickpeas, lentils, fava beans, black beans, white beans   Fruits  Veggies - Large quantities of antioxidant rich fruits & veggies; 6 or more  servings  - Vegetables can be eaten raw or lightly drizzled with oil and cooked  - Vegetables common to the traditional Mediterranean Diet include: artichokes, arugula, beets, broccoli, brussel sprouts, cabbage, carrots, celery, collard greens, cucumbers, eggplant, kale, leeks, lemons, lettuce, mushrooms, okra, onions, peas, peppers, potatoes, pumpkin, radishes, rutabaga, shallots, spinach, sweet potatoes, turnips, zucchini - Fruits common to the Mediterranean Diet include: apples, apricots, avocados, cherries, clementines, dates, figs, grapefruits, grapes, melons, nectarines, oranges, peaches, pears, pomegranates, strawberries, tangerines  Fats - Replace butter and margarine with healthy oils, such as olive oil, canola oil, and tahini  - Limit nuts to no more than a handful a day  - Nuts include walnuts, almonds, pecans, pistachios, pine nuts  - Limit or avoid candied, honey roasted or heavily salted nuts - Olives are central to the [employer] - can be eaten whole or used in a variety of dishes   Meats Protein - Limiting red meat: no more than a few times a month - When eating red meat: choose lean cuts and keep the portion to the size of deck of cards - Eggs: approx. 0 to 4 times a week  - Fish and lean poultry: at least 2 a week  - Healthy protein sources include, chicken, [place], lean beef, lamb - Increase intake of seafood such as tuna, salmon, trout, mackerel, shrimp, scallops - Avoid or limit high fat processed meats such as sausage and bacon  Dairy - Include moderate amounts of low fat dairy products  - Focus on healthy dairy such as fat free yogurt, skim milk, low  or reduced fat cheese - Limit dairy products higher in fat such as whole or 2% milk, cheese, ice cream  Alcohol - Moderate amounts of red wine is ok  - No more than 5 oz daily for women (all ages) and men older than age 27  - No more than 10 oz of wine daily for men younger than 81  Other - Limit sweets and other  desserts  - Use herbs and spices instead of salt to flavor foods  - Herbs and spices common to the traditional Mediterranean Diet include: basil, bay leaves, chives, cloves, cumin, fennel, garlic, lavender, marjoram, mint, oregano, parsley, pepper, rosemary, sage, savory, sumac, tarragon, thyme   It's not just a diet, it's a lifestyle:  The Mediterranean diet includes lifestyle factors typical of those in the region  Foods, drinks and meals are best eaten with others and savored Daily physical activity is important for overall good health This could be strenuous exercise like running and aerobics This could also be more leisurely activities such as walking, housework, yard-work, or taking the stairs Moderation is the key; a balanced and healthy diet accommodates most foods and drinks Consider portion sizes and frequency of consumption of certain foods   Meal Ideas & Options:  Breakfast:  Whole wheat toast or whole wheat English muffins with peanut butter & hard boiled egg Steel cut oats topped with apples & cinnamon and skim milk  Fresh fruit: banana, strawberries, melon, berries, peaches  Smoothies: strawberries, bananas, greek yogurt, peanut butter Low fat greek yogurt with blueberries and granola  Egg white omelet with spinach and mushrooms Breakfast couscous: whole wheat couscous, apricots, skim milk, cranberries  Sandwiches:  Hummus and grilled vegetables (peppers, zucchini, squash) on whole wheat bread   Grilled chicken on whole wheat pita with lettuce, tomatoes, cucumbers or tzatziki  [place] salad on whole wheat bread: tuna salad made with greek yogurt, olives, red peppers, capers, green onions Garlic rosemary lamb pita: lamb sauted with garlic, rosemary, salt & pepper; add lettuce, cucumber, greek yogurt to pita - flavor with lemon juice and black pepper  Seafood:  Mediterranean grilled salmon, seasoned with garlic, basil, parsley, lemon juice and black pepper Shrimp, lemon, and  spinach whole-grain pasta salad made with low fat greek yogurt  Seared scallops with lemon orzo  Seared tuna steaks seasoned salt, pepper, coriander topped with tomato mixture of olives, tomatoes, olive oil, minced garlic, parsley, green onions and cappers  Meats:  Herbed greek chicken salad with kalamata olives, cucumber, feta  Red bell peppers stuffed with spinach, bulgur, lean ground beef (or lentils) & topped with feta   Kebabs: skewers of chicken, tomatoes, onions, zucchini, squash  [place] burgers: made with red onions, mint, dill, lemon juice, feta cheese topped with roasted red peppers Vegetarian Cucumber salad: cucumbers, artichoke hearts, celery, red onion, feta cheese, tossed in olive oil & lemon juice  Hummus and whole grain pita points with a greek salad (lettuce, tomato, feta, olives, cucumbers, red onion) Lentil soup with celery, carrots made with vegetable broth, garlic, salt and pepper  Tabouli salad: parsley, bulgur, mint, scallions, cucumbers, tomato, radishes, lemon juice, olive oil, salt and pepper.

## 2023-02-12 NOTE — Assessment & Plan Note (Signed)
Disc goals for lipids and reasons to control them Rev last labs with pt Rev low sat fat diet in detail LDL up to 141 Diet is sub optimal /plans to change (husband does cooking)  Continues zetia 10 mg daily  Intolerant of statins in setting of fibromyalgia

## 2023-02-12 NOTE — Assessment & Plan Note (Signed)
Unfortunately worse  Continues rheum care for this and CT dz

## 2023-02-12 NOTE — Assessment & Plan Note (Signed)
No biologic meds Also fibromyalgia and IC Continues rheum care

## 2023-02-12 NOTE — Assessment & Plan Note (Signed)
Dexa 11/2021 One fall /no fracture  Discussed fall prevention, supplements and exercise for bone density   Exercise limited due to chronic pain

## 2023-02-12 NOTE — Assessment & Plan Note (Signed)
Last vitamin D Lab Results  Component Value Date   VD25OH 44.07 02/05/2023   Vitamin D level is therapeutic with current supplementation Disc importance of this to bone and overall health

## 2023-02-13 ENCOUNTER — Encounter: Payer: Self-pay | Admitting: *Deleted

## 2023-02-13 LAB — TSH: TSH: 2.41 u[IU]/mL (ref 0.35–5.50)

## 2023-02-13 LAB — BASIC METABOLIC PANEL WITH GFR
BUN: 13 mg/dL (ref 6–23)
CO2: 32 meq/L (ref 19–32)
Calcium: 10.6 mg/dL — ABNORMAL HIGH (ref 8.4–10.5)
Chloride: 99 meq/L (ref 96–112)
Creatinine, Ser: 0.87 mg/dL (ref 0.40–1.20)
GFR: 69.48 mL/min
Glucose, Bld: 108 mg/dL — ABNORMAL HIGH (ref 70–99)
Potassium: 4.9 meq/L (ref 3.5–5.1)
Sodium: 139 meq/L (ref 135–145)

## 2023-02-13 LAB — T4, FREE: Free T4: 0.66 ng/dL (ref 0.60–1.60)

## 2023-02-25 ENCOUNTER — Ambulatory Visit (AMBULATORY_SURGERY_CENTER): Payer: Medicare Other

## 2023-02-25 VITALS — Ht 63.5 in | Wt 185.0 lb

## 2023-02-25 DIAGNOSIS — Z1211 Encounter for screening for malignant neoplasm of colon: Secondary | ICD-10-CM

## 2023-02-25 MED ORDER — NA SULFATE-K SULFATE-MG SULF 17.5-3.13-1.6 GM/177ML PO SOLN: 1.0000 | Freq: Once | ORAL | 0 refills | Status: AC

## 2023-02-25 NOTE — Progress Notes (Signed)
No egg or soy allergy known to patient  No issues known to pt with past sedation with any surgeries or procedures Patient denies ever being told they had issues or difficulty with intubation  No FH of Malignant Hyperthermia Pt is not on diet pills Pt is not on  home 02  OSA inspire devices  Pt is not on blood thinners  Pt denies issues with constipation  No A fib or A flutter Have any cardiac testing pending--no  LOA: independent Prep: suprep   Patient's chart reviewed by Cathlyn Parsons CNRA prior to previsit and patient appropriate for the [facility].  Previsit completed and red dot placed by patient's name on their procedure day (on provider's schedule).     PV competed with patient. Prep instructions sent via mychart and home address. Goodrx coupon for provided to use for price reduction if needed.

## 2023-02-26 ENCOUNTER — Ambulatory Visit
Admission: RE | Admit: 2023-02-26 | Discharge: 2023-02-26 | Disposition: A | Discharge status: Home / Self Care | Payer: Medicare Other | Source: Ambulatory Visit | Attending: Family Medicine | Admitting: Family Medicine

## 2023-02-26 DIAGNOSIS — Z1231 Encounter for screening mammogram for malignant neoplasm of breast: Secondary | ICD-10-CM

## 2023-03-02 HISTORY — PX: COLONOSCOPY: SHX174

## 2023-03-06 ENCOUNTER — Other Ambulatory Visit: Payer: Self-pay

## 2023-03-06 ENCOUNTER — Encounter: Payer: Self-pay | Admitting: Internal Medicine

## 2023-03-10 NOTE — Progress Notes (Unsigned)
[facility] History and Physical   Primary Care Physician:  Tower, Audrie Gallus, MD   Reason for Procedure:   CRCA screening  Plan:    colonoscopy     HPI: Lindsey [name] is a 67 y.o. female here for screening exam. Last colonoscopy 2012.   Past Medical History:  Diagnosis Date   Arthritis    Cataract    bil - not ready for surgery   Chronic fatigue    Early onset macular degeneration    Bilateral   Fibromyalgia    GERD (gastroesophageal reflux disease)    Heart murmur    Hemorrhoids    Hyperglycemia 12/24/2013   Hyperlipidemia    Hypersomnia    Hypertension    IBS (irritable bowel syndrome)    Interstitial cystitis    Migraines    OSA (obstructive sleep apnea)    Osteopenia    Pelvic floor dysfunction    PFO (patent foramen ovale)    Pneumonia    Pre-diabetes    Tendonitis    hand   Vitamin D deficiency     Past Surgical History:  Procedure Laterality Date   ANKLE ARTHROSCOPY Right    ANKLE FRACTURE SURGERY Left    BLADDER SURGERY     and hydrodistension   BREAST EXCISIONAL BIOPSY Bilateral    Multiple Benign Lumps removed     BREAST LUMPECTOMY     many, bilateral   CERVICAL DISCECTOMY  2006   fusion and plating   CHOLECYSTECTOMY N/A 09/26/2015   Procedure: LAPAROSCOPIC CHOLECYSTECTOMY WITH INTRAOPERATIVE CHOLANGIOGRAM;  Surgeon: Luretha Murphy, MD;  Location: [facility] ORS;  Service: General;  Laterality: N/A;   COLONOSCOPY     DRUG INDUCED ENDOSCOPY Bilateral 10/17/2021   Procedure: DRUG INDUCED ENDOSCOPY;  Surgeon: Christia Reading, MD;  Location: [facility] OR;  Service: ENT;  Laterality: Bilateral;   FRACTURE SURGERY     ankle fracture x 2   IMPLANTATION OF HYPOGLOSSAL NERVE STIMULATOR Right 01/22/2022   Procedure: IMPLANTATION OF HYPOGLOSSAL NERVE STIMULATOR;  Surgeon: Christia Reading, MD;  Location: [facility];  Service: ENT;  Laterality: Right;   NASAL SINUS SURGERY     multiple, x4   UPPER GASTROINTESTINAL ENDOSCOPY     VAGINAL  HYSTERECTOMY  1990   because of the IC partial    Prior to Admission medications   Medication Sig Start Date End Date Taking? Authorizing Provider  atenolol (TENORMIN) 100 MG tablet TAKE 1 TABLET BY MOUTH EVERY DAY 01/24/23   Tower, Audrie Gallus, MD  baclofen (LIORESAL) 20 MG tablet Take 20 mg by mouth daily as needed for muscle spasms (as needed).    [provider]  botulinum toxin Type A (BOTOX) 100 units SOLR injection Inject 200 Units into the muscle every 3 (three) months.    [provider]  calcium carbonate (TUMS - DOSED IN MG ELEMENTAL CALCIUM) 500 MG chewable tablet Chew 1-2 tablets by mouth 2 (two) times daily as needed for indigestion or heartburn.    [provider]  Cholecalciferol (VITAMIN D-3) 125 MCG (5000 UT) TABS Take 5,000 Units by mouth every evening.    [provider]  diclofenac sodium (VOLTAREN) 1 % GEL Apply 2 to 4 gram to affected joints up to 4 times daily 01/05/19   Pollyann Savoy, MD  docusate sodium (COLACE) 100 MG capsule Take 200 mg by mouth at bedtime.    [provider]  DULoxetine (CYMBALTA) 60 MG capsule TAKE 1 CAPSULE BY MOUTH EVERY DAY 12/25/22  Pollyann Savoy, MD  ezetimibe (ZETIA) 10 MG tablet TAKE 1 TABLET BY MOUTH EVERY DAY 01/24/23   Tower, Audrie Gallus, MD  famotidine (PEPCID) 20 MG tablet Take 20 mg by mouth daily as needed for heartburn or indigestion.    [provider]  fentaNYL (DURAGESIC - DOSED MCG/HR) 50 MCG/HR Place 50 mcg onto the skin every other day.    [provider]  hydrOXYzine (ATARAX/VISTARIL) 50 MG tablet Take 50 mg by mouth at bedtime.    [provider]  ibuprofen (ADVIL) 200 MG tablet Take 400 mg by mouth every 8 (eight) hours as needed (pain.).    [provider]  lidocaine (XYLOCAINE) 5 % ointment Apply 1 Application topically daily as needed (pain.). 09/21/18   [provider]  LUTEIN PO Take 40 mg by mouth at bedtime.    [provider]  magnesium oxide (MAG-OX) 400 (240 Mg) MG tablet Take 400 mg by mouth at bedtime.    [provider]  naloxone [facility]) nasal spray 4 mg/0.1 mL Place into the nose. 08/06/22   [provider]  polyethylene glycol powder (GLYCOLAX/MIRALAX) 17 GM/SCOOP powder Take 17 g by mouth daily as needed (constipation.).    [provider]    Current Outpatient Medications  Medication Sig Dispense Refill   atenolol (TENORMIN) 100 MG tablet TAKE 1 TABLET BY MOUTH EVERY DAY 90 tablet 0   calcium carbonate (TUMS - DOSED IN MG ELEMENTAL CALCIUM) 500 MG chewable tablet Chew 1-2 tablets by mouth 2 (two) times daily as needed for indigestion or heartburn.     Cholecalciferol (VITAMIN D-3) 125 MCG (5000 UT) TABS Take 5,000 Units by mouth every evening.     docusate sodium (COLACE) 100 MG capsule Take 200 mg by mouth at bedtime.     DULoxetine (CYMBALTA) 60 MG capsule TAKE 1 CAPSULE BY MOUTH EVERY DAY 90 capsule 0   ezetimibe (ZETIA) 10 MG tablet TAKE 1 TABLET BY MOUTH EVERY DAY 90 tablet 0   famotidine (PEPCID) 20 MG tablet Take 20 mg by mouth daily as needed for heartburn or indigestion.     fentaNYL (DURAGESIC - DOSED MCG/HR) 50 MCG/HR Place 50 mcg onto the skin every other day.     hydrOXYzine (ATARAX/VISTARIL) 50 MG tablet Take 50 mg by mouth at bedtime.     LUTEIN PO Take 40 mg by mouth at bedtime.     magnesium oxide (MAG-OX) 400 (240 Mg) MG tablet Take 400 mg by mouth at bedtime.     Na Sulfate-K Sulfate-Mg Sulf 17.5-3.13-1.6 GM/177ML SOLN PLEASE SEE ATTACHED FOR DETAILED DIRECTIONS     baclofen (LIORESAL) 20 MG tablet Take 20 mg by mouth daily as needed for muscle spasms (as needed).     botulinum toxin Type A (BOTOX) 100 units SOLR injection Inject 200 Units into the muscle every 3 (three) months.     diclofenac sodium (VOLTAREN) 1 % GEL Apply 2 to 4 gram to affected joints up to 4 times daily 400 g 2   ibuprofen (ADVIL) 200 MG tablet Take 400 mg by mouth every 8 (eight) hours  as needed (pain.).     lidocaine (XYLOCAINE) 5 % ointment Apply 1 Application topically daily as needed (pain.).     naloxone (NARCAN) nasal spray 4 mg/0.1 mL Place into the nose.     polyethylene glycol powder (GLYCOLAX/MIRALAX) 17 GM/SCOOP powder Take 17 g by mouth daily as needed (constipation.).     Current Facility-Administered Medications  Medication Dose  Route Frequency Provider Last Rate Last Admin   0.9 %  sodium chloride infusion  500 mL Intravenous Continuous Iva Boop, MD        Allergies as of 03/11/2023 - Review Complete 03/11/2023  Allergen Reaction Noted   Buspirone hcl Other (See Comments) 06/21/2022   Crestor [rosuvastatin]  11/06/2020   Simvastatin  12/28/2009   Sulfa antibiotics Other (See Comments) 08/12/2012   Sulfasalazine Nausea And Vomiting 08/12/2012    Family History  Problem Relation Age of Onset   Fibromyalgia Mother    Thyroid disease Mother    Heart attack Father    Diabetes Father    Alcohol abuse Father    Heart disease Father    Colon cancer Paternal Uncle    Diabetes Paternal Grandmother    Stroke Paternal Grandmother    Stomach cancer Paternal Grandfather    Diabetes Paternal Grandfather    Breast cancer Neg Hx    Rectal cancer Neg Hx    Colon polyps Neg Hx    Esophageal cancer Neg Hx     Social History   Socioeconomic History   Marital status: Married    Spouse name: Not on file   Number of children: 0   Years of education: Not on file   Highest education level: Bachelor's degree (e.g., BA, AB, BS)  Occupational History   Occupation: Disabled from [employer]    Employer: DISABLED  Tobacco Use   Smoking status: Never    Passive exposure: Past   Smokeless tobacco: Never  Vaping Use   Vaping status: Never Used  Substance and Sexual Activity   Alcohol use: No    Alcohol/week: 0.0 standard drinks of alcohol   Drug use: No   Sexual activity: Yes  Other Topics Concern   Not on file  Social History Narrative   Not on file    Social Determinants of Health   Financial Resource Strain: Low Risk  (02/12/2023)   Overall Financial Resource Strain (CARDIA)    Difficulty of Paying Living Expenses: Not very hard  Food Insecurity: Low Risk  (02/18/2023)   Received from [facility]   Hunger Vital Sign    Worried About Running Out of Food in the Last Year: Never true    Ran Out of Food in the Last Year: Never true  Transportation Needs: No Transportation Needs (02/18/2023)   Received from [facility]    In the past 12 months, has lack of reliable transportation kept you from medical appointments, meetings, work or from getting things needed for daily living? : No  Physical Activity: Unknown (02/12/2023)   Exercise Vital Sign    Days of Exercise per Week: 0 days    Minutes of Exercise per Session: Not on file  Recent Concern: Physical Activity - Inactive (02/12/2023)   Exercise Vital Sign    Days of Exercise per Week: 0 days    Minutes of Exercise per Session: 0 min  Stress: Stress Concern Present (02/12/2023)   [employer] of Occupational Health - Occupational Stress Questionnaire    Feeling of Stress : Rather much  Social Connections: Socially Isolated (02/12/2023)   Social Connection and Isolation Panel [NHANES]    Frequency of Communication with Friends and Family: Never    Frequency of Social Gatherings with Friends and Family: Never    Attends Religious Services: Never    Database administrator or Organizations: No    Attends Banker Meetings: Not on file  Marital Status: Married  Catering manager Violence: Not At Risk (03/04/2022)   Humiliation, Afraid, Rape, and Kick questionnaire    Fear of Current or Ex-Partner: No    Emotionally Abused: No    Physically Abused: No    Sexually Abused: No    Review of Systems:  All other review of systems negative except as mentioned in the HPI.  Physical Exam: Vital signs BP (!) 156/82   Pulse 63   Temp (!) 97.3  F (36.3 C) (Temporal)   Resp 11   Ht 5' 3.5" (1.613 m)   Wt 185 lb (83.9 kg)   SpO2 99%   BMI 32.26 kg/m   General:   Alert,  Well-developed, well-nourished, pleasant and cooperative in NAD Lungs:  Clear throughout to auscultation.   Heart:  Regular rate and rhythm; no murmurs, clicks, rubs,  or gallops. Abdomen:  Soft, nontender and nondistended. Normal bowel sounds.   Neuro/Psych:  Alert and cooperative. Normal mood and affect. A and O x 3   @Latravis Grine  Sena Slate, MD, [facility] [phone] (pager) 03/11/2023 3:19 PM@

## 2023-03-11 ENCOUNTER — Ambulatory Visit: Payer: Medicare Other | Admitting: Internal Medicine

## 2023-03-11 ENCOUNTER — Encounter: Payer: Self-pay | Admitting: Internal Medicine

## 2023-03-11 VITALS — BP 142/78 | HR 72 | Temp 97.3°F | Resp 15 | Ht 63.5 in | Wt 185.0 lb

## 2023-03-11 DIAGNOSIS — D124 Benign neoplasm of descending colon: Secondary | ICD-10-CM

## 2023-03-11 DIAGNOSIS — Z1211 Encounter for screening for malignant neoplasm of colon: Secondary | ICD-10-CM

## 2023-03-11 DIAGNOSIS — D125 Benign neoplasm of sigmoid colon: Secondary | ICD-10-CM

## 2023-03-11 DIAGNOSIS — D123 Benign neoplasm of transverse colon: Secondary | ICD-10-CM

## 2023-03-11 DIAGNOSIS — R195 Other fecal abnormalities: Secondary | ICD-10-CM

## 2023-03-11 MED ORDER — SODIUM CHLORIDE 0.9 % IV SOLN: 500.0000 mL | INTRAVENOUS | Status: DC

## 2023-03-11 NOTE — Patient Instructions (Addendum)
Please read handouts provided. Continue present medications. Await pathology results.   YOU HAD AN ENDOSCOPIC PROCEDURE TODAY AT THE [facility]:   Refer to the procedure report that was given to you for any specific questions about what was found during the examination.  If the procedure report does not answer your questions, please call your gastroenterologist to clarify.  If you requested that your care partner not be given the details of your procedure findings, then the procedure report has been included in a sealed envelope for you to review at your convenience later.  YOU SHOULD EXPECT: Some feelings of bloating in the abdomen. Passage of more gas than usual.  Walking can help get rid of the air that was put into your GI tract during the procedure and reduce the bloating. If you had a lower endoscopy (such as a colonoscopy or flexible sigmoidoscopy) you may notice spotting of blood in your stool or on the toilet paper. If you underwent a bowel prep for your procedure, you may not have a normal bowel movement for a few days.  Please Note:  You might notice some irritation and congestion in your nose or some drainage.  This is from the oxygen used during your procedure.  There is no need for concern and it should clear up in a day or so.  SYMPTOMS TO REPORT IMMEDIATELY:  Following lower endoscopy (colonoscopy or flexible sigmoidoscopy):  Excessive amounts of blood in the stool  Significant tenderness or worsening of abdominal pains  Swelling of the abdomen that is new, acute  Fever of 100F or higher.  For urgent or emergent issues, a gastroenterologist can be reached at any hour by calling [phone]. Do not use MyChart messaging for urgent concerns.    DIET:  We do recommend a small meal at first, but then you may proceed to your regular diet.  Drink plenty of fluids but you should avoid alcoholic beverages for 24 hours.  ACTIVITY:  You should plan to take it easy for  the rest of today and you should NOT DRIVE or use heavy machinery until tomorrow (because of the sedation medicines used during the test).    FOLLOW UP: Our staff will call the number listed on your records the next business day following your procedure.  We will call around 7:15- 8:00 am to check on you and address any questions or concerns that you may have regarding the information given to you following your procedure. If we do not reach you, we will leave a message.     If any biopsies were taken you will be contacted by phone or by letter within the next 1-3 weeks.  Please call us at [phone] if you have not heard about the biopsies in 3 weeks.    SIGNATURES/CONFIDENTIALITY: You and/or your care partner have signed paperwork which will be entered into your electronic medical record.  These signatures attest to the fact that that the information above on your After Visit Summary has been reviewed and is understood.  Full responsibility of the confidentiality of this discharge information lies with you and/or your care-partner.There is a segment of the colon that has numerous polyps that cannot be removed endoscopically. I took some biopsies and marked the area with tattoos. I think this will need to be removed by a surgeon.  There were 3 other discrete polyps removed.  Everything looks benign.  I appreciate the opportunity to care for you. Iva Boop, MD, [facility]

## 2023-03-11 NOTE — Progress Notes (Signed)
Pt's states no medical or surgical changes since previsit or office visit. 

## 2023-03-11 NOTE — Op Note (Addendum)
[facility] Patient Name: Lindsey [name] Procedure Date: 03/11/2023 3:12 PM MRN: [id number] Endoscopist: Iva Boop , MD, [id number] Age: 67 Referring MD:  Date of Birth: 19-Sep-1955 Gender: Female Account #: [id number] Procedure:                Colonoscopy Indications:              Screening for colorectal malignant neoplasm, Last                            colonoscopy: 2012 Medicines:                Monitored Anesthesia Care Procedure:                Pre-Anesthesia Assessment:                           - Prior to the procedure, a History and Physical                            was performed, and patient medications and                            allergies were reviewed. The patient's tolerance of                            previous anesthesia was also reviewed. The risks                            and benefits of the procedure and the sedation                            options and risks were discussed with the patient.                            All questions were answered, and informed consent                            was obtained. Prior Anticoagulants: The patient has                            taken no anticoagulant or antiplatelet agents. ASA                            Grade Assessment: III - A patient with severe                            systemic disease. After reviewing the risks and                            benefits, the patient was deemed in satisfactory                            condition to undergo the procedure.  After obtaining informed consent, the colonoscope                            was passed under direct vision. Throughout the                            procedure, the patient's blood pressure, pulse, and                            oxygen saturations were monitored continuously. The                            Olympus Scope SN [id number] was introduced through the                            anus and advanced to the the cecum,  identified by                            appendiceal orifice and ileocecal valve. The                            colonoscopy was performed without difficulty. The                            patient tolerated the procedure well. The quality                            of the bowel preparation was excellent. The bowel                            preparation used was SUPREP via split dose                            instruction. The ileocecal valve, appendiceal                            orifice, and rectum were photographed. Scope In: 3:26:03 PM Scope Out: 3:52:30 PM Scope Withdrawal Time: 0 hours 22 minutes 23 seconds  Total Procedure Duration: 0 hours 26 minutes 27 seconds  Findings:                 The perianal and digital rectal examinations were                            normal.                           A patchy area of polypoid mucosa was found in the                            proximal descending colon and in the distal                            transverse colon. Biopsies were taken with a cold  forceps for histology. Verification of patient                            identification for the specimen was done. Estimated                            blood loss was minimal. Area was tattooed with an                            injection of Spot (carbon black).                           Three sessile polyps were found in the proximal                            sigmoid colon and proximal transverse colon. The                            polyps were 3 to 9 mm in size. These polyps were                            removed with a cold snare. Resection and retrieval                            were complete. Verification of patient                            identification for the specimen was done. Estimated                            blood loss was minimal.                           The exam was otherwise without abnormality on                            direct and  retroflexion views. Complications:            No immediate complications. Estimated Blood Loss:     Estimated blood loss was minimal. Impression:               - Polypoid mucosa in the proximal descending colon                            and in the distal transverse colon. Biopsied.                            Tattooed. Numerous sessile polyps, several appear                            to be lateral spreading carpet-like. Patchy but                            extensive. 60-78 cm estimate - proximal and distal  tattoos.                           - Three 3 to 9 mm polyps in the proximal sigmoid                            colon and in the proximal transverse colon, removed                            with a cold snare. Resected and retrieved.                           - The examination was otherwise normal on direct                            and retroflexion views. Note she told me in                            recovery that she had had a + Cologuard test Recommendation:           - Patient has a contact number available for                            emergencies. The signs and symptoms of potential                            delayed complications were discussed with the                            patient. Return to normal activities tomorrow.                            Written discharge instructions were provided to the                            patient.                           - Resume previous diet.                           - Continue present medications.                           - Await pathology results.                           - I anticipate she will need to see a colorectal                            surgeon - will discuss after pathology review Iva Boop, MD 03/11/2023 4:06:29 PM This report has been signed electronically.

## 2023-03-11 NOTE — Progress Notes (Signed)
Report to PACU, RN, vss, BBS= Clear.  

## 2023-03-11 NOTE — Progress Notes (Signed)
Called to room to assist during endoscopic procedure.  Patient ID and intended procedure confirmed with present staff. Received instructions for my participation in the procedure from the performing physician.  

## 2023-03-12 ENCOUNTER — Telehealth: Payer: Self-pay

## 2023-03-12 ENCOUNTER — Ambulatory Visit: Payer: Medicare Other

## 2023-03-12 VITALS — Ht 63.5 in | Wt 185.0 lb

## 2023-03-12 DIAGNOSIS — Z0000 Encounter for general adult medical examination without abnormal findings: Secondary | ICD-10-CM

## 2023-03-12 NOTE — Patient Instructions (Signed)
Ms. Parmelee , Thank you for taking time to come for your Medicare Wellness Visit. I appreciate your ongoing commitment to your health goals. Please review the following plan we discussed and let me know if I can assist you in the future.   Referrals/Orders/Follow-Ups/Clinician Recommendations: none  This is a list of the screening recommended for you and due dates:  Health Maintenance  Topic Date Due   COVID-19 Vaccine (4 - 2023-24 season) 12/01/2022   Zoster (Shingles) Vaccine (1 of 2) 05/15/2023*   DEXA scan (bone density measurement)  12/25/2023   Medicare Annual Wellness Visit  03/11/2024   Mammogram  02/25/2025   Cologuard (Stool DNA test)  01/16/2026   DTaP/Tdap/Td vaccine (3 - Td or Tdap) 06/18/2027   Pneumonia Vaccine  Completed   Flu Shot  Completed   Hepatitis C Screening  Completed   HPV Vaccine  Aged Out   Colon Cancer Screening  Discontinued  *Topic was postponed. The date shown is not the original due date.    Advanced directives: (Declined) Advance directive discussed with you today. Even though you declined this today, please call our office should you change your mind, and we can give you the proper paperwork for you to fill out.  Next Medicare Annual Wellness Visit scheduled for next year: Yes 03/12/24 @ 2:20pm telephone

## 2023-03-12 NOTE — Telephone Encounter (Signed)
  Follow up Call-     03/11/2023    2:43 PM  Call back number  Post procedure Call Back phone  # [phone]  Permission to leave phone message Yes     Patient questions:  Do you have a fever, pain , or abdominal swelling? Pt states some discomfort but is not worried. RN encouraged patient to call us if symptoms worsen. Pain Score  0 *  Have you tolerated food without any problems? Yes.    Have you been able to return to your normal activities? Yes.    Do you have any questions about your discharge instructions: Diet   No. Medications  No. Follow up visit  No.  Do you have questions or concerns about your Care? No.  Actions: * If pain score is 4 or above: No action needed, pain <4.

## 2023-03-12 NOTE — Progress Notes (Signed)
Subjective:   Lindsey [name] is a 67 y.o. female who presents for Medicare Annual (Subsequent) preventive examination.  Visit Complete: Virtual I connected with  Lindsey [name] on 03/12/23 by a audio enabled telemedicine application and verified that I am speaking with the correct person using two identifiers.  Patient Location: Home  Provider Location: Home Office  I discussed the limitations of evaluation and management by telemedicine. The patient expressed understanding and agreed to proceed.  Vital Signs: Because this visit was a virtual/telehealth visit, some criteria may be missing or patient reported. Any vitals not documented were not able to be obtained and vitals that have been documented are patient reported.  Patient Medicare AWV questionnaire was completed by the patient on 03/10/23; I have confirmed that all information answered by patient is correct and no changes since this date. Cardiac Risk Factors include: advanced age (>47men, >39 women);hypertension;dyslipidemia;obesity (BMI >30kg/m2);sedentary lifestyle    Objective:    Today's Vitals   03/10/23 1346 03/12/23 1408  Weight:  185 lb (83.9 kg)  Height:  5' 3.5" (1.613 m)  PainSc: 5     Body mass index is 32.26 kg/m.     03/12/2023    2:24 PM 03/04/2022   11:47 AM 01/22/2022   12:12 PM 01/11/2022    9:12 AM 10/17/2021    9:31 AM 02/28/2021   12:02 PM 08/25/2019    1:11 PM  Advanced Directives  Does Patient Have a Medical Advance Directive? No No No No No No No  Would patient like information on creating a medical advance directive?  No - Patient declined No - Patient declined No - Patient declined No - Patient declined Yes (MAU/Ambulatory/Procedural Areas - Information given) No - Patient declined    Current Medications (verified) Outpatient Encounter Medications as of 03/12/2023  Medication Sig   atenolol (TENORMIN) 100 MG tablet TAKE 1 TABLET BY MOUTH EVERY DAY   baclofen (LIORESAL) 20 MG tablet Take 20  mg by mouth daily as needed for muscle spasms (as needed).   botulinum toxin Type A (BOTOX) 100 units SOLR injection Inject 200 Units into the muscle every 3 (three) months.   calcium carbonate (TUMS - DOSED IN MG ELEMENTAL CALCIUM) 500 MG chewable tablet Chew 1-2 tablets by mouth 2 (two) times daily as needed for indigestion or heartburn.   Cholecalciferol (VITAMIN D-3) 125 MCG (5000 UT) TABS Take 5,000 Units by mouth every evening.   diclofenac sodium (VOLTAREN) 1 % GEL Apply 2 to 4 gram to affected joints up to 4 times daily   docusate sodium (COLACE) 100 MG capsule Take 200 mg by mouth at bedtime.   DULoxetine (CYMBALTA) 60 MG capsule TAKE 1 CAPSULE BY MOUTH EVERY DAY   ezetimibe (ZETIA) 10 MG tablet TAKE 1 TABLET BY MOUTH EVERY DAY   famotidine (PEPCID) 20 MG tablet Take 20 mg by mouth daily as needed for heartburn or indigestion.   fentaNYL (DURAGESIC - DOSED MCG/HR) 50 MCG/HR Place 50 mcg onto the skin every other day.   hydrOXYzine (ATARAX/VISTARIL) 50 MG tablet Take 50 mg by mouth at bedtime.   ibuprofen (ADVIL) 200 MG tablet Take 400 mg by mouth every 8 (eight) hours as needed (pain.).   lidocaine (XYLOCAINE) 5 % ointment Apply 1 Application topically daily as needed (pain.).   LUTEIN PO Take 40 mg by mouth at bedtime.   magnesium oxide (MAG-OX) 400 (240 Mg) MG tablet Take 400 mg by mouth at bedtime.   naloxone (NARCAN) nasal spray  4 mg/0.1 mL Place into the nose.   polyethylene glycol powder (GLYCOLAX/MIRALAX) 17 GM/SCOOP powder Take 17 g by mouth daily as needed (constipation.).   No facility-administered encounter medications on file as of 03/12/2023.    Allergies (verified) Buspirone hcl, Crestor [rosuvastatin], Simvastatin, Sulfa antibiotics, and Sulfasalazine   History: Past Medical History:  Diagnosis Date   Arthritis    Cataract    bil - not ready for surgery   Chronic fatigue    Early onset macular degeneration    Bilateral   Fibromyalgia    GERD (gastroesophageal  reflux disease)    Heart murmur    Hemorrhoids    Hyperglycemia 12/24/2013   Hyperlipidemia    Hypersomnia    Hypertension    IBS (irritable bowel syndrome)    Interstitial cystitis    Migraines    OSA (obstructive sleep apnea)    Osteopenia    Pelvic floor dysfunction    PFO (patent foramen ovale)    Pneumonia    Pre-diabetes    Tendonitis    hand   Vitamin D deficiency    Past Surgical History:  Procedure Laterality Date   ANKLE ARTHROSCOPY Right    ANKLE FRACTURE SURGERY Left    BLADDER SURGERY     and hydrodistension   BREAST EXCISIONAL BIOPSY Bilateral    Multiple Benign Lumps removed     BREAST LUMPECTOMY     many, bilateral   CERVICAL DISCECTOMY  2006   fusion and plating   CHOLECYSTECTOMY N/A 09/26/2015   Procedure: LAPAROSCOPIC CHOLECYSTECTOMY WITH INTRAOPERATIVE CHOLANGIOGRAM;  Surgeon: Luretha Murphy, MD;  Location: [facility] ORS;  Service: General;  Laterality: N/A;   COLONOSCOPY     DRUG INDUCED ENDOSCOPY Bilateral 10/17/2021   Procedure: DRUG INDUCED ENDOSCOPY;  Surgeon: Christia Reading, MD;  Location: [facility] OR;  Service: ENT;  Laterality: Bilateral;   FRACTURE SURGERY     ankle fracture x 2   IMPLANTATION OF HYPOGLOSSAL NERVE STIMULATOR Right 01/22/2022   Procedure: IMPLANTATION OF HYPOGLOSSAL NERVE STIMULATOR;  Surgeon: Christia Reading, MD;  Location: [facility];  Service: ENT;  Laterality: Right;   NASAL SINUS SURGERY     multiple, x4   UPPER GASTROINTESTINAL ENDOSCOPY     VAGINAL HYSTERECTOMY  1990   because of the IC partial   Family History  Problem Relation Age of Onset   Fibromyalgia Mother    Thyroid disease Mother    Heart attack Father    Diabetes Father    Alcohol abuse Father    Heart disease Father    Colon cancer Paternal Uncle    Diabetes Paternal Grandmother    Stroke Paternal Grandmother    Stomach cancer Paternal Grandfather    Diabetes Paternal Grandfather    Breast cancer Neg Hx    Rectal cancer Neg Hx    Colon polyps  Neg Hx    Esophageal cancer Neg Hx    Social History   Socioeconomic History   Marital status: Married    Spouse name: Not on file   Number of children: 0   Years of education: Not on file   Highest education level: Bachelor's degree (e.g., BA, AB, BS)  Occupational History   Occupation: Disabled from [employer]    Employer: DISABLED  Tobacco Use   Smoking status: Never    Passive exposure: Past   Smokeless tobacco: Never  Vaping Use   Vaping status: Never Used  Substance and Sexual Activity   Alcohol use: No  Alcohol/week: 0.0 standard drinks of alcohol   Drug use: No   Sexual activity: Yes  Other Topics Concern   Not on file  Social History Narrative   Not on file   Social Determinants of Health   Financial Resource Strain: Low Risk  (03/10/2023)   Overall Financial Resource Strain (CARDIA)    Difficulty of Paying Living Expenses: Not hard at all  Food Insecurity: No Food Insecurity (03/10/2023)   Hunger Vital Sign    Worried About Running Out of Food in the Last Year: Never true    Ran Out of Food in the Last Year: Never true  Transportation Needs: No Transportation Needs (03/10/2023)   PRAPARE - Administrator, Civil Service (Medical): No    Lack of Transportation (Non-Medical): No  Physical Activity: Inactive (03/10/2023)   Exercise Vital Sign    Days of Exercise per Week: 0 days    Minutes of Exercise per Session: 0 min  Stress: Stress Concern Present (03/10/2023)   [employer] of Occupational Health - Occupational Stress Questionnaire    Feeling of Stress : Rather much  Social Connections: Socially Isolated (03/10/2023)   Social Connection and Isolation Panel [NHANES]    Frequency of Communication with Friends and Family: Never    Frequency of Social Gatherings with Friends and Family: Never    Attends Religious Services: Never    Database administrator or Organizations: No    Attends Engineer, structural: Never    Marital Status: Married     Tobacco Counseling Counseling given: Not Answered  Clinical Intake:  Pre-visit preparation completed: Yes  Pain : 0-10 Pain Score: 5  Pain Type: Acute pain Pain Location: Abdomen Pain Descriptors / Indicators: Aching Pain Onset: Yesterday Pain Frequency: Constant    BMI - recorded: 32.26 Nutritional Status: BMI > 30  Obese Nutritional Risks: Nausea/ vomitting/ diarrhea (has nausea since prep for colonoscopy) Diabetes: No  How often do you need to have someone help you when you read instructions, pamphlets, or other written materials from your doctor or pharmacy?: 2 - Rarely  Interpreter Needed?: No  Comments: lives with husband Information entered by :: B.Rudy Luhmann,LPN   Activities of Daily Living    03/10/2023    1:46 PM  In your present state of health, do you have any difficulty performing the following activities:  Hearing? 0  Vision? 1  Difficulty concentrating or making decisions? 1  Walking or climbing stairs? 1  Dressing or bathing? 0  Doing errands, shopping? 1  Preparing Food and eating ? Y  Using the Toilet? N  In the past six months, have you accidently leaked urine? Y  Do you have problems with loss of bowel control? N  Managing your Medications? N  Managing your Finances? Y  Housekeeping or managing your Housekeeping? Y    Patient Care Team: Tower, Audrie Gallus, MD as PCP - General Debbe Odea, MD as PCP - Cardiology (Cardiology) Kathyrn Sheriff, [facility] (Inactive) as Pharmacist (Pharmacist) Lockie Mola, MD as Referring Physician (Ophthalmology)  Indicate any recent Medical Services you may have received from other than [facility] providers in the past year (date may be approximate).     Assessment:   This is a routine wellness examination for [place].  Hearing/Vision screen Hearing Screening - Comments:: Pt says her hearing is good Vision Screening - Comments:: Pt says her vision is not good: has macular degeneration Dr  Inez Pilgrim   Goals Addressed   None  Depression Screen    03/12/2023    2:21 PM 02/12/2023    3:02 PM 03/04/2022   11:53 AM 02/28/2021   12:06 PM 11/30/2019    2:44 PM 08/25/2019    1:13 PM 08/07/2018    3:59 PM  PHQ 2/9 Scores  PHQ - 2 Score 5 5 2  0 4 0 0  PHQ- 9 Score 13  10  20  0 0    Fall Risk    03/10/2023    1:46 PM 02/12/2023    3:02 PM 03/04/2022   11:45 AM 02/28/2021   12:04 PM 11/30/2019    2:44 PM  Fall Risk   Falls in the past year? 1 1 0 0 1  Number falls in past yr: 0 0 0 0 0  Injury with Fall? 0 1 0 0 1  Risk for fall due to : No Fall Risks History of fall(s)  No Fall Risks History of fall(s)  Follow up Education provided;Falls prevention discussed Falls evaluation completed Falls evaluation completed;Education provided;Falls prevention discussed Falls prevention discussed Falls evaluation completed    MEDICARE RISK AT HOME: Medicare Risk at Home Any stairs in or around the home?: Yes If so, are there any without handrails?: Yes Home free of loose throw rugs in walkways, pet beds, electrical cords, etc?: Yes Adequate lighting in your home to reduce risk of falls?: Yes Life alert?: No Use of a cane, walker or w/c?: No Grab bars in the bathroom?: No Shower chair or bench in shower?: Yes Elevated toilet seat or a handicapped toilet?: No  TIMED UP AND GO:  Was the test performed?  No    Cognitive Function:    08/25/2019    1:17 PM 08/07/2018    3:59 PM 08/05/2017    1:27 PM 04/23/2016    1:30 PM  MMSE - Mini Mental State Exam  Orientation to time 5 5 5 5   Orientation to Place 5 5 5 5   Registration 3 3 3 3   Attention/ Calculation 5 0 0 0  Recall 3 3 3 3   Language- name 2 objects  0 0 0  Language- repeat 1 1 1 1   Language- follow 3 step command  0 3 3  Language- read & follow direction  0 0 0  Write a sentence  0 0 0  Copy design  0 0 0  Total score  17 20 20         03/12/2023    2:30 PM 03/04/2022   11:50 AM 02/28/2021   12:07 PM  6CIT  Screen  What Year? 0 points 0 points 0 points  What month? 0 points 0 points 0 points  What time? 0 points 0 points 0 points  Count back from 20 0 points 0 points 0 points  Months in reverse 0 points 0 points 0 points  Repeat phrase 0 points 0 points 0 points  Total Score 0 points 0 points 0 points    Immunizations Immunization History  Administered Date(s) Administered   Influenza Inj Mdck Quad Pf 12/16/2017   Influenza Whole 12/31/2006   Influenza, High Dose Seasonal PF 12/26/2021   Influenza,inj,Quad PF,6+ Mos 12/24/2013, 02/20/2016, 10/30/2016, 11/30/2016, 11/26/2018   Influenza-Unspecified 12/18/2022   PFIZER(Purple Top)SARS-COV-2 Vaccination 06/18/2019, 07/13/2019, 03/06/2020   PNEUMOCOCCAL CONJUGATE-20 01/07/2022   Pneumococcal Polysaccharide-23 09/04/2012   Td 04/23/2007   Tdap 06/17/2017    TDAP status: Up to date  Flu Vaccine status: Up to date  Pneumococcal vaccine status: Up to date  Covid-19 vaccine status: Completed vaccines  Qualifies for Shingles Vaccine? Yes   Zostavax completed No   Shingrix Completed?: No.    Education has been provided regarding the importance of this vaccine. Patient has been advised to call insurance company to determine out of pocket expense if they have not yet received this vaccine. Advised may also receive vaccine at local pharmacy or Health Dept. Verbalized acceptance and understanding.  Screening Tests Health Maintenance  Topic Date Due   COVID-19 Vaccine (4 - 2023-24 season) 03/28/2023 (Originally 12/01/2022)   Zoster Vaccines- Shingrix (1 of 2) 05/15/2023 (Originally 10/21/2006)   DEXA SCAN  12/25/2023   Medicare Annual Wellness (AWV)  03/11/2024   MAMMOGRAM  02/25/2025   Fecal DNA (Cologuard)  01/16/2026   DTaP/Tdap/Td (3 - Td or Tdap) 06/18/2027   Pneumonia Vaccine 2+ Years old  Completed   INFLUENZA VACCINE  Completed   Hepatitis C Screening  Completed   HPV VACCINES  Aged Out   Colonoscopy  Discontinued    Health  Maintenance  There are no preventive care reminders to display for this patient.   Colorectal cancer screening: Type of screening: Colonoscopy. Completed 03/11/23. Repeat every 10 years  Mammogram status: Completed 02/26/23. Repeat every year  Bone Density status: Completed 12/24/2021. Results reflect: Bone density results: OSTEOPENIA. Repeat every 3 years.  Lung Cancer Screening: (Low Dose CT Chest recommended if Age 66-80 years, 20 pack-year currently smoking OR have quit w/in 15years.) does not qualify.   Lung Cancer Screening Referral: no  Additional Screening:  Hepatitis C Screening: does not qualify; Completed 04/23/2016  Vision Screening: Recommended annual ophthalmology exams for early detection of glaucoma and other disorders of the eye. Is the patient up to date with their annual eye exam?  Yes  Who is the provider or what is the name of the office in which the patient attends annual eye exams? Dr Inez Pilgrim If pt is not established with a provider, would they like to be referred to a provider to establish care? No .   Dental Screening: Recommended annual dental exams for proper oral hygiene  Diabetic Foot Exam: n/a  Community Resource Referral / Chronic Care Management: CRR required this visit?  No   CCM required this visit?  No    Plan:     I have personally reviewed and noted the following in the patient's chart:   Medical and social history Use of alcohol, tobacco or illicit drugs  Current medications and supplements including opioid prescriptions. Patient is currently taking opioid prescriptions. Information provided to patient regarding non-opioid alternatives. Patient advised to discuss non-opioid treatment plan with their provider. Functional ability and status Nutritional status Physical activity Advanced directives List of other physicians Hospitalizations, surgeries, and ER visits in previous 12 months Vitals Screenings to include cognitive,  depression, and falls Referrals and appointments  In addition, I have reviewed and discussed with patient certain preventive protocols, quality metrics, and best practice recommendations. A written personalized care plan for preventive services as well as general preventive health recommendations were provided to patient.    Sue Lush, LPN   16/12/9602   After Visit Summary: (MyChart) Due to this being a telephonic visit, the after visit summary with patients personalized plan was offered to patient via MyChart   Nurse Notes: Pt relays she is having some abdominal/pelvic pain from a colonoscopy on yesterday. She relays she has called the provider who performed and made them aware. She will follow up with them if sx worsen  or not subside. She has no other concerns or questions at this time.

## 2023-03-14 LAB — SURGICAL PATHOLOGY

## 2023-03-17 ENCOUNTER — Telehealth: Payer: Self-pay | Admitting: Internal Medicine

## 2023-03-17 DIAGNOSIS — D1391 Familial adenomatous polyposis: Secondary | ICD-10-CM | POA: Insufficient documentation

## 2023-03-17 NOTE — Telephone Encounter (Signed)
Reviewed colon polyp pathology - all adenomas and she has numerous in a section of the colon. I explained the next steps (listed below)  She needs:  1) Referral to genetics counselor at [facility] re: polyposis coli  2) Referral to Dr. Inetta Fermo surgery at [facility] re: polyposis coli  Need to send colonoscopy report and pathology reports to Dr. Drue Dun

## 2023-03-18 ENCOUNTER — Other Ambulatory Visit: Payer: Self-pay

## 2023-03-18 DIAGNOSIS — D1391 Familial adenomatous polyposis: Secondary | ICD-10-CM

## 2023-03-18 NOTE — Telephone Encounter (Signed)
Pt was notified of Dr. Leone Payor recommendations: Referral were sent to  1) Referral to genetics counselor at [facility] re: polyposis coli   2) Referral to Dr. Inetta Fermo surgery at [facility] re: polyposis coli  Pt made aware. Pt notified that their office should contact her within 3 weeks and if she has not heard anything from their office within three weeks then to give our office a call back so that we can check on the status of the referrals.  Pt verbalized understanding with all questions answered.

## 2023-03-27 ENCOUNTER — Other Ambulatory Visit: Payer: Self-pay | Admitting: Rheumatology

## 2023-03-27 NOTE — Telephone Encounter (Signed)
Last Fill: 12/25/2022  Next Visit: 07/09/2023  Last Visit: 01/09/2023  Dx: Fibromyalgia   Current Dose per office note on 01/09/2023: Cymbalta 60 mg p.o. daily   Okay to refill Cymbalta?

## 2023-04-08 ENCOUNTER — Telehealth: Payer: Self-pay | Admitting: Genetic Counselor

## 2023-04-08 NOTE — Telephone Encounter (Signed)
 Scheduled appointments per scheduling message. Patient is aware of made appointments.

## 2023-04-23 ENCOUNTER — Other Ambulatory Visit: Payer: Self-pay | Admitting: Family Medicine

## 2023-06-19 NOTE — Progress Notes (Signed)
 REFERRING PROVIDER: Iva Boop, MD [address] [place],  [place] [address]   PRIMARY PROVIDER:  Judy Pimple, MD  PRIMARY REASON FOR VISIT:  Encounter Diagnoses  Name Primary?   History of colonic polyps Yes   Adenomatous polyps of colon, unspecified part of colon     HISTORY OF PRESENT ILLNESS:   Ms. Skorupski, a 68 y.o. female, was seen for a  cancer genetics consultation at the request of Dr. Leone Payor due to a personal history of colon polyps.  Ms. Sequeira presents to clinic today to discuss the possibility of a hereditary predisposition to cancer, to discuss genetic testing, and to further clarify her future cancer risks, as well as potential cancer risks for family members.   Ms. Wiens is a 68 y.o. female with no personal history of cancer.  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  A patchy but extensive area of numerous sessile polyps in the proximal descending colon and in the distal transverse colonwere also detected by colonoscopies.  Biopsies confirmed tubular adenomas.  She had a surgery consult with Dr. Inetta Fermo earlier this month with follow up planned the first week in April.    SCREENING/RISK FACTORS:  Mammogram within the last year: yes; most recent November 2024.  Category D density.  R breast biopsy in 2014--fibrocystic changes. L breast biopsy in 2010-- fibroadenoma.  Hx of ~5 lumpectomies per patient.  Colonoscopy: yes;  see history noted above . Hysterectomy: yes; fibroids.  Ovaries intact: yes.  Menarche was at age 71 or 64.  Nulliparous.  Menopausal status: postmenopausal.  OCP use for approximately 1 year.  IUD for several years.  HRT use:  10  years  Stopped 10 years ago.  Estradiol.  Dermatology screening: previously.    Past Medical History:  Diagnosis Date   Arthritis    Cataract    bil - not ready for surgery   Chronic fatigue     Early onset macular degeneration    Bilateral   Fibromyalgia    GERD (gastroesophageal reflux disease)    Heart murmur    Hemorrhoids    Hyperglycemia 12/24/2013   Hyperlipidemia    Hypersomnia    Hypertension    IBS (irritable bowel syndrome)    Interstitial cystitis    Migraines    OSA (obstructive sleep apnea)    Osteopenia    Pelvic floor dysfunction    PFO (patent foramen ovale)    Pneumonia    Pre-diabetes    Tendonitis    hand   Vitamin D deficiency     Past Surgical History:  Procedure Laterality Date   ANKLE ARTHROSCOPY Right    ANKLE FRACTURE SURGERY Left    BLADDER SURGERY     and hydrodistension   BREAST EXCISIONAL BIOPSY Bilateral    Multiple Benign Lumps removed     BREAST LUMPECTOMY     many, bilateral   CERVICAL DISCECTOMY  2006   fusion and plating   CHOLECYSTECTOMY N/A 09/26/2015   Procedure: LAPAROSCOPIC CHOLECYSTECTOMY WITH INTRAOPERATIVE CHOLANGIOGRAM;  Surgeon: Luretha Murphy, MD;  Location: [facility] ORS;  Service: General;  Laterality: N/A;   COLONOSCOPY     DRUG INDUCED ENDOSCOPY Bilateral 10/17/2021   Procedure: DRUG INDUCED ENDOSCOPY;  Surgeon: Christia Reading, MD;  Location: [facility] OR;  Service: ENT;  Laterality: Bilateral;   FRACTURE SURGERY     ankle fracture x 2   IMPLANTATION OF HYPOGLOSSAL NERVE  STIMULATOR Right 01/22/2022   Procedure: IMPLANTATION OF HYPOGLOSSAL NERVE STIMULATOR;  Surgeon: Christia Reading, MD;  Location: [facility];  Service: ENT;  Laterality: Right;   NASAL SINUS SURGERY     multiple, x4   UPPER GASTROINTESTINAL ENDOSCOPY     VAGINAL HYSTERECTOMY  1990   because of the IC partial     FAMILY HISTORY:  We obtained a detailed, 4-generation family history.  Significant diagnoses are listed below: Family History  Problem Relation Age of Onset   Brain cancer Maternal Aunt        dx 21s; d. 73s   Brain cancer Maternal Uncle        dx 27s; d. 42s   Cancer Paternal Aunt        unknown type; d. late 60s-early 70s;  mets   Melanoma Maternal Grandmother        dx >50   Stomach cancer Paternal Grandfather        dx 47s; d. 4s      Ms. Offer is unaware of previous family history of genetic testing for hereditary cancer risks. She has limited information about cousins.  Other relatives are unavailable for genetic testing at this time.    There is no reported Ashkenazi Jewish ancestry. There is no known consanguinity.  GENETIC COUNSELING ASSESSMENT: Ms. Newborn is a 68 y.o. 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 We, therefore, discussed and recommended the following at today's visit.   DISCUSSION: We discussed that polyps in general are common, however, most people have fewer than 5 lifetime polyps.  When an individual has 10 or more polyps we become concerned about an underlying polyposis syndrome.  The most common hereditary polyposis syndromes are Familial Adenomatous Polyposis (FAP), caused by mutations in the APC gene, and MUTYH-Associated Polyposis (MAP), caused by mutations in the MUTYH gene.  There are other genes that are associated with polyposis, colon cancer, or other cancers.  We discussed that testing is beneficial for several reasons, including knowing about cancer risks, identifying potential screening and risk-reduction options that may be appropriate, and to understanding if other family members could be at risk for colon polyps and/or cancer and allow them to undergo genetic testing.   We reviewed the characteristics, features and inheritance patterns of hereditary cancer syndromes. We also discussed genetic testing, including the appropriate family members to test, the process of testing, insurance coverage and turn-around-time for results. We discussed the implications of a negative, positive and/or variant of uncertain significant result. We recommended Ms. Kraker pursue genetic testing for genes  associated with polyposis, CNS cancer, melanoma, and other cancers.  Ms. Renk was offered a common hereditary cancer panel (~40 genes) and an expanded pan-cancer panel (~70 genes). Ms. Janis was informed of the benefits and limitations of each panel, including that expanded pan-cancer panels contain several genes that do not have clear management guidelines at this point in time.  We also discussed that as the number of genes included on a panel increases, the chances of variants of uncertain significance increases.  After considering the benefits and limitations of each gene panel, Ms. Tietje elected to have an expanded Psychologist, occupational through [employer]   The CancerNext-Expanded gene panel offered by [facility] and includes sequencing, rearrangement, and RNA analysis for the following 76 genes: AIP, ALK, APC, ATM, AXIN2, BAP1, BARD1, BMPR1A, BRCA1, BRCA2, BRIP1, CDC73, CDH1, CDK4, CDKN1B, CDKN2A, CEBPA, CHEK2,  CTNNA1, DDX41, DICER1, ETV6, FH, FLCN, GATA2, LZTR1, MAX, MBD4, MEN1, MET, MLH1, MSH2, MSH3, MSH6, MUTYH, NF1, NF2, NTHL1, PALB2, PHOX2B, PMS2, POT1, PRKAR1A, PTCH1, PTEN, RAD51C, RAD51D, RB1, RET, RUNX1, SDHA, SDHAF2, SDHB, SDHC, SDHD, SMAD4, SMARCA4, SMARCB1, SMARCE1, STK11, SUFU, TMEM127, TP53, TSC1, TSC2, VHL, and WT1 (sequencing and deletion/duplication); EGFR, HOXB13, KIT, MITF, PDGFRA, POLD1, and POLE (sequencing only); EPCAM and GREM1 (deletion/duplication only).   Based on Ms. Calixto's personal history of more than 10 lifetime tubular adenomas, she meets NCCN medical criteria for genetic testing.   She is the most informative relative available for genetic testing.   We discussed the Genetic Information Non-Discrimination Act (GINA) of 2008, which helps protect individuals against genetic discrimination based on their genetic test results.  It impacts both health insurance and employment.  With health insurance, it protects against genetic test results being used for increased  premiums or policy termination. For employment, it protects against hiring, firing and promoting decisions based on genetic test results.  GINA does not apply to those in the [employer], those who work for companies with less than 15 employees, and new life insurance or long-term disability insurance policies.  Health status due to a cancer diagnosis is not protected under GINA.  PLAN: After considering the risks, benefits, and limitations, Ms. Schnake provided informed consent to pursue genetic testing and the blood sample was sent to [facility] for analysis of the CancerNext-Expanded +RNAinsight Panel. Results should be available within approximately 3 weeks' time, at which point they will be disclosed by telephone to Ms. Marshman, as will any additional recommendations warranted by these results. Ms. Jaquez will receive a summary of her genetic counseling visit and a copy of her results once available. This information will also be available in Epic.   Ms. Noblett questions were answered to her satisfaction today. Our contact information was provided should additional questions or concerns arise. Thank you for the referral and allowing [place] to share in the care of your patient.   Adlean Hardeman M. Rennie Plowman, MS, [facility] Genetic Counselor [email or web address] (P) [phone]    40 minutes were spent on the date of the encounter in service to the patient including preparation, face-to-face consultation, documentation and care coordination.  The patient was seen alone.  Drs. Gunnar Bulla and/or Mosetta Putt were available to discuss this case as needed.   _______________________________________________________________________ For Office Staff:  Number of people involved in session: 1 Was an Intern/ student involved with case: yes; [employer] intern Candace Teague conducted session under my direct supervision

## 2023-06-22 ENCOUNTER — Other Ambulatory Visit: Payer: Self-pay | Admitting: Physician Assistant

## 2023-06-23 ENCOUNTER — Inpatient Hospital Stay: Payer: Medicare Other | Attending: Nurse Practitioner | Admitting: Genetic Counselor

## 2023-06-23 ENCOUNTER — Inpatient Hospital Stay: Payer: Medicare Other

## 2023-06-23 ENCOUNTER — Encounter: Payer: Self-pay | Admitting: Genetic Counselor

## 2023-06-23 DIAGNOSIS — Z808 Family history of malignant neoplasm of other organs or systems: Secondary | ICD-10-CM | POA: Diagnosis not present

## 2023-06-23 DIAGNOSIS — Z840 Family history of diseases of the skin and subcutaneous tissue: Secondary | ICD-10-CM

## 2023-06-23 DIAGNOSIS — D126 Benign neoplasm of colon, unspecified: Secondary | ICD-10-CM

## 2023-06-23 DIAGNOSIS — Z809 Family history of malignant neoplasm, unspecified: Secondary | ICD-10-CM

## 2023-06-23 DIAGNOSIS — Z860100 Personal history of colon polyps, unspecified: Secondary | ICD-10-CM | POA: Diagnosis not present

## 2023-06-23 DIAGNOSIS — D1391 Familial adenomatous polyposis: Secondary | ICD-10-CM

## 2023-06-23 LAB — GENETIC SCREENING ORDER

## 2023-06-23 NOTE — Telephone Encounter (Signed)
 Last Fill: 03/27/2023  Next Visit: 07/09/2023  Last Visit: 01/09/2023  Dx: Fibromyalgia   Current Dose per office note on 01/09/2023: Cymbalta 60 mg p.o. daily   Okay to refill Cymbalta?

## 2023-06-25 NOTE — Progress Notes (Signed)
 Office Visit Note  Patient: Lindsey [name]             Date of Birth: 1956-03-06           MRN: [id number]             PCP: Judy Pimple, MD Referring: Tower, Audrie Gallus, MD Visit Date: 07/09/2023 Occupation: @GUAROCC @  Subjective:  Generalized pain   History of Present Illness: Lindsey [name] is a 68 y.o. female with history of fibromyalgia and osteoarthritis.  Patient presents today with ongoing generalized myalgias and arthralgias.  She has been experiencing ongoing discomfort in both knee joints but denies any joint swelling.  Patient states she is also had discomfort on the lateral aspect of both hips consistent with trochanter bursitis.  Patient does not find Voltaren gel to be very effective at managing her symptoms so she has not been using it frequently.  Patient remains on Cymbalta 60 mg daily.  She has been experiencing more frequent flares of IC recently.  Patient reports that she has been under the care of Dr. Leone Payor and recently had a colonoscopy.  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 Drue Dun at [facility] to discuss the next steps in treatment.  Activities of Daily Living:  Patient reports morning stiffness for several hours.   Patient Reports nocturnal pain.  Difficulty dressing/grooming: Denies Difficulty climbing stairs: Reports Difficulty getting out of chair: Reports Difficulty using hands for taps, buttons, cutlery, and/or writing: Reports  Review of Systems  Constitutional:  Positive for fatigue.  HENT:  Positive for mouth dryness and nose dryness. Negative for mouth sores.   Eyes:  Positive for pain, visual disturbance and dryness.  Respiratory:  Positive for shortness of breath. Negative for cough, hemoptysis and difficulty breathing.   Cardiovascular:  Negative for chest pain, palpitations, hypertension and swelling in legs/feet.  Gastrointestinal:  Positive for constipation and diarrhea. Negative for  blood in stool.  Endocrine: Positive for increased urination.  Genitourinary:  Positive for painful urination.  Musculoskeletal:  Positive for joint pain, joint pain, myalgias, muscle weakness, morning stiffness, muscle tenderness and myalgias. Negative for joint swelling.  Skin:  Negative for color change, pallor, rash, hair loss, nodules/bumps, skin tightness, ulcers and sensitivity to sunlight.  Neurological:  Positive for dizziness, headaches and weakness. Negative for numbness.  Hematological:  Positive for swollen glands.  Psychiatric/Behavioral:  Positive for depressed mood and sleep disturbance. The patient is nervous/anxious.     PMFS History:  Patient Active Problem List   Diagnosis Date Noted   Polyposis coli 03/17/2023   Hyperkalemia 02/12/2023   Colon cancer screening 11/06/2020   Subclinical hypothyroidism 05/29/2020   Prediabetes 08/09/2018   Estrogen deficiency 01/28/2018   Autoimmune disease (HCC) 06/18/2017   History of recurrent cystitis (IC) 08/16/2016   Pelvic floor dysfunction 08/16/2016   Dysuria 05/24/2016   Macular degeneration 04/09/2016   Osteoarthritis, knee 02/15/2016   S/P laparoscopic cholecystectomy June 2017 09/26/2015   Dermatitis 10/31/2014   Encounter for Medicare annual wellness exam 09/04/2012   HSV (herpes simplex virus) infection 04/08/2012   IBS (irritable bowel syndrome) 10/21/2011   Routine general medical examination at a health care facility 07/02/2011   Osteopenia 05/17/2009   Depressed mood 10/05/2008   GERD 05/05/2008   OSA (obstructive sleep apnea) 06/25/2007   Hyperlipidemia 05/31/2007   Essential hypertension 05/31/2007   Other fatigue 04/23/2007   Vitamin D deficiency 01/22/2007  Migraine headache 01/22/2007   Interstitial cystitis 01/22/2007   MENOPAUSE-RELATED VASOMOTOR SYMPTOMS 01/22/2007   OSTEOARTHROSIS, GENERALIZED, MULTIPLE SITES 01/22/2007   Fibromyalgia 01/22/2007   PATENT FORAMEN OVALE 01/22/2007    Past  Medical History:  Diagnosis Date   Arthritis    Cataract    bil - not ready for surgery   Chronic fatigue    Early onset macular degeneration    Bilateral   Fibromyalgia    GERD (gastroesophageal reflux disease)    Heart murmur    Hemorrhoids    Hyperglycemia 12/24/2013   Hyperlipidemia    Hypersomnia    Hypertension    IBS (irritable bowel syndrome)    Interstitial cystitis    Migraines    OSA (obstructive sleep apnea)    Osteopenia    Pelvic floor dysfunction    PFO (patent foramen ovale)    Pneumonia    Pre-diabetes    Tendonitis    hand   Vitamin D deficiency     Family History  Problem Relation Age of Onset   Fibromyalgia Mother    Thyroid disease Mother    Heart attack Father    Diabetes Father    Alcohol abuse Father    Heart disease Father    Brain cancer Maternal Aunt        dx 24s; d. 86s   Brain cancer Maternal Uncle        dx 109s; d. 71s   Cancer Paternal Aunt        unknown type; d. late 60s-early 70s; mets   Melanoma Maternal Grandmother        dx >50   Diabetes Paternal Grandmother    Stroke Paternal Grandmother    Stomach cancer Paternal Grandfather        dx 86s; d. 31s   Diabetes Paternal Grandfather    Breast cancer Neg Hx    Rectal cancer Neg Hx    Colon polyps Neg Hx    Esophageal cancer Neg Hx    Past Surgical History:  Procedure Laterality Date   ANKLE ARTHROSCOPY Right    ANKLE FRACTURE SURGERY Left    BLADDER SURGERY     and hydrodistension   BREAST EXCISIONAL BIOPSY Bilateral    Multiple Benign Lumps removed     BREAST LUMPECTOMY     many, bilateral   CERVICAL DISCECTOMY  2006   fusion and plating   CHOLECYSTECTOMY N/A 09/26/2015   Procedure: LAPAROSCOPIC CHOLECYSTECTOMY WITH INTRAOPERATIVE CHOLANGIOGRAM;  Surgeon: Luretha Murphy, MD;  Location: [facility] ORS;  Service: General;  Laterality: N/A;   COLONOSCOPY     COLONOSCOPY  03/2023   DRUG INDUCED ENDOSCOPY Bilateral 10/17/2021   Procedure: DRUG INDUCED ENDOSCOPY;   Surgeon: Christia Reading, MD;  Location: [facility] OR;  Service: ENT;  Laterality: Bilateral;   FRACTURE SURGERY     ankle fracture x 2   IMPLANTATION OF HYPOGLOSSAL NERVE STIMULATOR Right 01/22/2022   Procedure: IMPLANTATION OF HYPOGLOSSAL NERVE STIMULATOR;  Surgeon: Christia Reading, MD;  Location: [facility];  Service: ENT;  Laterality: Right;   NASAL SINUS SURGERY     multiple, x4   UPPER GASTROINTESTINAL ENDOSCOPY     VAGINAL HYSTERECTOMY  1990   because of the IC partial   Social History   Social History Narrative   Not on file   Immunization History  Administered Date(s) Administered   Influenza Inj Mdck Quad Pf 12/16/2017   Influenza Whole 12/31/2006   Influenza, High Dose Seasonal PF 12/26/2021  Influenza,inj,Quad PF,6+ Mos 12/24/2013, 02/20/2016, 10/30/2016, 11/30/2016, 11/26/2018   Influenza-Unspecified 12/18/2022   PFIZER(Purple Top)SARS-COV-2 Vaccination 06/18/2019, 07/13/2019, 03/06/2020   PNEUMOCOCCAL CONJUGATE-20 01/07/2022   Pneumococcal Polysaccharide-23 09/04/2012   Td 04/23/2007   Tdap 06/17/2017     Objective: Vital Signs: BP 137/85 (BP Location: Left Arm, Patient Position: Sitting, Cuff Size: Normal)   Pulse (!) 56   Resp 17   Ht 5' 3.5" (1.613 m)   Wt 188 lb 9.6 oz (85.5 kg)   BMI 32.88 kg/m    Physical Exam Vitals and nursing note reviewed.  Constitutional:      Appearance: She is well-developed.  HENT:     Head: Normocephalic and atraumatic.  Eyes:     Conjunctiva/sclera: Conjunctivae normal.  Cardiovascular:     Rate and Rhythm: Normal rate and regular rhythm.     Heart sounds: Normal heart sounds.  Pulmonary:     Effort: Pulmonary effort is normal.     Breath sounds: Normal breath sounds.  Abdominal:     General: Bowel sounds are normal.     Palpations: Abdomen is soft.  Musculoskeletal:     Cervical back: Normal range of motion.  Lymphadenopathy:     Cervical: No cervical adenopathy.  Skin:    General: Skin is warm and dry.      Capillary Refill: Capillary refill takes less than 2 seconds.  Neurological:     Mental Status: She is alert and oriented to person, place, and time.  Psychiatric:        Behavior: Behavior normal.      Musculoskeletal Exam: Generalized hyperalgesia and positive tender points on exam.  C-spine has limited range of motion.  Shoulder joints, elbow joints, wrist joints, MCPs, PIPs, DIPs have good range of motion with no synovitis.  Complete fist formation bilaterally.  Hip joints have good range of motion with no groin pain.  Tenderness over the trochanteric bursa bilaterally.  Knee joints have good range of motion with no warmth or effusion.  Crepitus noted in both knees.  Ankle joints have good range of motion with no tenderness or joint swelling.  CDAI Exam: CDAI Score: -- Patient Global: --; Provider Global: -- Swollen: --; Tender: -- Joint Exam 07/09/2023   No joint exam has been documented for this visit   There is currently no information documented on the homunculus. Go to the Rheumatology activity and complete the homunculus joint exam.  Investigation: No additional findings.  Imaging: No results found.  Recent Labs: Lab Results  Component Value Date   WBC 7.0 02/05/2023   HGB 14.6 02/05/2023   PLT 266.0 02/05/2023   NA 139 02/12/2023   K 4.9 02/12/2023   CL 99 02/12/2023   CO2 32 02/12/2023   GLUCOSE 108 (H) 02/12/2023   BUN 13 02/12/2023   CREATININE 0.87 02/12/2023   BILITOT 0.9 02/05/2023   ALKPHOS 77 02/05/2023   AST 18 02/05/2023   ALT 14 02/05/2023   PROT 6.9 02/05/2023   ALBUMIN 4.0 02/05/2023   CALCIUM 10.6 (H) 02/12/2023   GFRAA 98 04/23/2007    Speciality Comments: No specialty comments available.  Procedures:  No procedures performed Allergies: Buspirone hcl, Crestor [rosuvastatin], Simvastatin, Sulfa antibiotics, and Sulfasalazine   Assessment / Plan:     Visit Diagnoses: Fibromyalgia -She has generalized hyperalgesia and positive tender  points on exam.  Patient continues to experience intermittent myalgias and muscle tenderness due to fibromyalgia.  Patient remains on Cymbalta 60 mg 1 capsule daily.  She presents  today with discomfort on the lateral aspect of both hips consistent with trochanteric bursitis as well as ongoing discomfort in both knees.  She has no synovitis on examination. Discussed the importance of regular exercise and good sleep hygiene.  Offered referral to physical therapy to help alleviate the discomfort in her hips but she would like to hold off at this time.  Handout of home exercises was provided.  She will notify us if her symptoms persist or worsen. She will follow-up in the office in 6 months or sooner if needed.  Other fatigue: Chronic, stable.  Discussed importance of regular exercise and good sleep hygiene.  Primary insomnia - Underwent nocturnal polysomnography on 02/14/2020 followed by a home sleep test on 04/19/2020. Patient had inspire implant placement in October 2023.  Primary osteoarthritis of both hands: She has PIP and DIP thickening consistent with osteoarthritis of both hands.  No synovitis noted on examination today.   Trigger thumb, left thumb: Not currently symptomatic.  Trochanteric bursitis of both hips : Patient presents today with discomfort on the lateral aspect of both hips consistent with trochanter bursitis.  She is good range of motion of both hip joints with no groin pain.  Tenderness over the trochanteric bursa bilaterally was noted.  Different treatment options were discussed.  Patient was given a handout of home exercises to perform.  Also discussed a referral to [facility] for physical therapy but she would like to hold off at this time.  She will notify us when and if she would like referral placed for PT.  Interstitial cystitis: Patient has been experiencing more frequent flares of IC.  Offered a referral back to [facility] but she would like to hold off at this  time.  Chondromalacia of both patellae: She has good ROM of both knees with crepitus.  Discomfort with ROM noted. No warmth or effusion.  Discussed the importance of lower extremity muscle strengthening.   Osteopenia of multiple sites -DEXA 12/24/2021 T-score -1.7, BMD 0.807 right femoral neck.  DEXA scan is followed by her PCP. DEXA due in September 2025.   History of vitamin D deficiency: She is taking vitamin D 5000 units daily.   Polyposis coli: Diagnosed by Dr. Leone Payor.  Referred to geneticist as well as a specialist at [facility].  Other medical conditions are listed as follows:  OSA (obstructive sleep apnea)  History of hypertension: Blood pressure was 137/85 today in the office.  History of hyperlipidemia  History of hyperglycemia  History of depression: She remains on Cymbalta 60 mg 1 capsule by mouth daily.  History of IBS  S/P laparoscopic cholecystectomy June 2017  Orders: No orders of the defined types were placed in this encounter.  No orders of the defined types were placed in this encounter.    Follow-Up Instructions: Return in about 6 months (around 01/08/2024) for Fibromyalgia, Osteoarthritis.   Gearldine Bienenstock, PA-C  Note - This record has been created using Dragon software.  Chart creation errors have been sought, but may not always  have been located. Such creation errors do not reflect on  the standard of medical care.

## 2023-07-09 ENCOUNTER — Encounter: Payer: Self-pay | Admitting: Physician Assistant

## 2023-07-09 ENCOUNTER — Ambulatory Visit: Payer: Medicare Other | Attending: Physician Assistant | Admitting: Physician Assistant

## 2023-07-09 VITALS — BP 137/85 | HR 56 | Resp 17 | Ht 63.5 in | Wt 188.6 lb

## 2023-07-09 DIAGNOSIS — M7061 Trochanteric bursitis, right hip: Secondary | ICD-10-CM | POA: Diagnosis present

## 2023-07-09 DIAGNOSIS — Z8719 Personal history of other diseases of the digestive system: Secondary | ICD-10-CM | POA: Diagnosis present

## 2023-07-09 DIAGNOSIS — M7062 Trochanteric bursitis, left hip: Secondary | ICD-10-CM | POA: Insufficient documentation

## 2023-07-09 DIAGNOSIS — G4733 Obstructive sleep apnea (adult) (pediatric): Secondary | ICD-10-CM | POA: Diagnosis present

## 2023-07-09 DIAGNOSIS — M65312 Trigger thumb, left thumb: Secondary | ICD-10-CM | POA: Diagnosis present

## 2023-07-09 DIAGNOSIS — D1391 Familial adenomatous polyposis: Secondary | ICD-10-CM | POA: Diagnosis present

## 2023-07-09 DIAGNOSIS — M19042 Primary osteoarthritis, left hand: Secondary | ICD-10-CM | POA: Insufficient documentation

## 2023-07-09 DIAGNOSIS — M2242 Chondromalacia patellae, left knee: Secondary | ICD-10-CM | POA: Diagnosis present

## 2023-07-09 DIAGNOSIS — Z9049 Acquired absence of other specified parts of digestive tract: Secondary | ICD-10-CM | POA: Diagnosis present

## 2023-07-09 DIAGNOSIS — N3010 Interstitial cystitis (chronic) without hematuria: Secondary | ICD-10-CM | POA: Insufficient documentation

## 2023-07-09 DIAGNOSIS — M797 Fibromyalgia: Secondary | ICD-10-CM | POA: Insufficient documentation

## 2023-07-09 DIAGNOSIS — R5383 Other fatigue: Secondary | ICD-10-CM | POA: Insufficient documentation

## 2023-07-09 DIAGNOSIS — F5101 Primary insomnia: Secondary | ICD-10-CM | POA: Diagnosis present

## 2023-07-09 DIAGNOSIS — M2241 Chondromalacia patellae, right knee: Secondary | ICD-10-CM | POA: Insufficient documentation

## 2023-07-09 DIAGNOSIS — Z8659 Personal history of other mental and behavioral disorders: Secondary | ICD-10-CM | POA: Insufficient documentation

## 2023-07-09 DIAGNOSIS — M8589 Other specified disorders of bone density and structure, multiple sites: Secondary | ICD-10-CM | POA: Diagnosis present

## 2023-07-09 DIAGNOSIS — Z8679 Personal history of other diseases of the circulatory system: Secondary | ICD-10-CM | POA: Diagnosis present

## 2023-07-09 DIAGNOSIS — M19041 Primary osteoarthritis, right hand: Secondary | ICD-10-CM | POA: Insufficient documentation

## 2023-07-09 DIAGNOSIS — Z8639 Personal history of other endocrine, nutritional and metabolic disease: Secondary | ICD-10-CM | POA: Diagnosis present

## 2023-07-09 NOTE — Patient Instructions (Signed)
 Hip Bursitis Rehab Ask your health care provider which exercises are safe for you. Do exercises exactly as told by your health care provider and adjust them as directed. It is normal to feel mild stretching, pulling, tightness, or discomfort as you do these exercises. Stop right away if you feel sudden pain or your pain gets worse. Do not begin these exercises until told by your health care provider. Stretching exercise This exercise warms up your muscles and joints and improves the movement and flexibility of your hip. This exercise also helps to relieve pain and stiffness. Iliotibial band stretch An iliotibial band is a strong band of muscle tissue that runs from the outer side of your hip to the outer side of your thigh and knee. Lie on your side with your left / right leg in the top position. Bend your left / right knee and grab your ankle. Stretch out your bottom arm to help you balance. Slowly bring your knee back so your thigh is slightly behind your body. Slowly lower your knee toward the floor until you feel a gentle stretch on the outside of your left / right thigh. If you do not feel a stretch and your knee will not lower more toward the floor, place the heel of your other foot on top of your knee and pull your knee down toward the floor with your foot. Hold this position for __________ seconds. Slowly return to the starting position. Repeat __________ times. Complete this exercise __________ times a day. Strengthening exercises These exercises build strength and endurance in your hip and pelvis. Endurance is the ability to use your muscles for a long time, even after they get tired. Bridge This exercise strengthens the muscles that move your thigh backward (hip extensors). Lie on your back on a firm surface with your knees bent and your feet flat on the floor. Tighten your buttocks muscles and lift your buttocks off the floor until your trunk is level with your thighs. Do not arch your  back. You should feel the muscles working in your buttocks and the back of your thighs. If you do not feel these muscles, slide your feet 1-2 inches (2.5-5 cm) farther away from your buttocks. If this exercise is too easy, try doing it with your arms crossed over your chest. Hold this position for __________ seconds. Slowly lower your hips to the starting position. Let your muscles relax completely after each repetition. Repeat __________ times. Complete this exercise __________ times a day. Squats This exercise strengthens the muscles in front of your thigh and knee (quadriceps). Stand in front of a table, with your feet and knees pointing straight ahead. You may rest your hands on the table for balance but not for support. Slowly bend your knees and lower your hips like you are going to sit in a chair. Keep your weight over your heels, not over your toes. Keep your lower legs upright so they are parallel with the table legs. Do not let your hips go lower than your knees. Do not bend lower than told by your health care provider. If your hip pain increases, do not bend as low. Hold the squat position for __________ seconds. Slowly push with your legs to return to standing. Do not use your hands to pull yourself to standing. Repeat __________ times. Complete this exercise __________ times a day. Hip hike  Stand sideways on a bottom step. Stand on your left / right leg with your other foot unsupported next to  the step. You can hold on to the railing or wall for balance if needed. Keep your knees straight and your torso square. Then lift your left / right hip up toward the ceiling. Hold this position for __________ seconds. Slowly let your left / right hip lower toward the floor, past the starting position. Your foot should get closer to the floor. Do not lean or bend your knees. Repeat __________ times. Complete this exercise __________ times a day. Single leg stand This exercise increases  your balance. Without shoes, stand near a railing or in a doorway. You may hold on to the railing or door frame as needed for balance. Squeeze your left / right buttock muscles, then lift up your other foot. Do not let your left / right hip push out to the side. It is helpful to stand in front of a mirror for this exercise so you can watch your hip. Hold this position for __________ seconds. Repeat __________ times. Complete this exercise __________ times a day. This information is not intended to replace advice given to you by your health care provider. Make sure you discuss any questions you have with your health care provider. Document Revised: 02/28/2021 Document Reviewed: 02/28/2021 Elsevier Patient Education  2024 [employer].

## 2023-07-10 ENCOUNTER — Ambulatory Visit: Payer: Medicare Other | Admitting: Physician Assistant

## 2023-07-11 ENCOUNTER — Ambulatory Visit: Payer: Self-pay

## 2023-07-11 ENCOUNTER — Ambulatory Visit: Admitting: Family Medicine

## 2023-07-11 ENCOUNTER — Encounter: Payer: Self-pay | Admitting: Family Medicine

## 2023-07-11 VITALS — BP 130/80 | HR 62 | Temp 97.5°F | Ht 63.5 in | Wt 189.4 lb

## 2023-07-11 DIAGNOSIS — N3000 Acute cystitis without hematuria: Secondary | ICD-10-CM | POA: Diagnosis not present

## 2023-07-11 DIAGNOSIS — R300 Dysuria: Secondary | ICD-10-CM | POA: Diagnosis not present

## 2023-07-11 LAB — POCT UA - MICROSCOPIC ONLY

## 2023-07-11 LAB — POC URINALSYSI DIPSTICK (AUTOMATED)
Bilirubin, UA: NEGATIVE
Glucose, UA: NEGATIVE
Ketones, UA: NEGATIVE
Nitrite, UA: NEGATIVE
Protein, UA: POSITIVE — AB
Spec Grav, UA: 1.015 (ref 1.010–1.025)
Urobilinogen, UA: 0.2 U/dL
pH, UA: 5.5 (ref 5.0–8.0)

## 2023-07-11 MED ORDER — CEPHALEXIN 500 MG PO CAPS: 500.0000 mg | ORAL_CAPSULE | Freq: Three times a day (TID) | ORAL | 0 refills | Status: DC

## 2023-07-11 NOTE — Progress Notes (Signed)
 Patient ID: ADEOLA DENNEN, female    DOB: 1955-08-17, 68 y.o.   MRN: [id number]  This visit was conducted in person.  BP 130/80 (BP Location: Left Arm, Patient Position: Sitting, Cuff Size: Large)   Pulse 62   Temp (!) 97.5 F (36.4 C) (Temporal)   Ht 5' 3.5" (1.613 m)   Wt 189 lb 6 oz (85.9 kg)   SpO2 97%   BMI 33.02 kg/m    CC:  Chief Complaint  Patient presents with   Dysuria    X 4 days    Subjective:   HPI: ARIANI SEIER is a 68 y.o. female patient of Dr. Royden Purl with history of recurrent cystitis presenting on 07/11/2023 for Dysuria (X 4 days)   Dysuria  This is a new problem. The current episode started in the past 7 days. The problem has been gradually worsening. The quality of the pain is described as aching. The pain is moderate. There has been no fever. She is Sexually active. Associated symptoms include frequency, nausea and urgency. Pertinent negatives include no chills, discharge, flank pain, hematuria or vomiting. Associated symptoms comments: Low back pain  Low abd pain.. suprapubic  Urine odor. She has tried increased fluids for the symptoms. The treatment provided no relief. Her past medical history is significant for recurrent UTIs. There is no history of catheterization, kidney stones, a single kidney or a urological procedure. Has interstitial cystitis, no recnet antibiotics.Marland Kitchen last UTI > 1 year        Relevant past medical, surgical, family and social history reviewed and updated as indicated. Interim medical history since our last visit reviewed. Allergies and medications reviewed and updated. Outpatient Medications Prior to Visit  Medication Sig Dispense Refill   atenolol (TENORMIN) 100 MG tablet TAKE 1 TABLET BY MOUTH EVERY DAY 90 tablet 2   baclofen (LIORESAL) 20 MG tablet Take 20 mg by mouth daily as needed for muscle spasms (as needed).     botulinum toxin Type A (BOTOX) 100 units SOLR injection Inject 200 Units into the muscle every 3 (three)  months.     calcium carbonate (TUMS - DOSED IN MG ELEMENTAL CALCIUM) 500 MG chewable tablet Chew 1-2 tablets by mouth 2 (two) times daily as needed for indigestion or heartburn.     Cholecalciferol (VITAMIN D-3) 125 MCG (5000 UT) TABS Take 5,000 Units by mouth every evening.     diclofenac sodium (VOLTAREN) 1 % GEL Apply 2 to 4 gram to affected joints up to 4 times daily 400 g 2   docusate sodium (COLACE) 100 MG capsule Take 200 mg by mouth at bedtime.     DULoxetine (CYMBALTA) 60 MG capsule TAKE 1 CAPSULE BY MOUTH EVERY DAY 90 capsule 0   ezetimibe (ZETIA) 10 MG tablet TAKE 1 TABLET BY MOUTH EVERY DAY 90 tablet 2   famotidine (PEPCID) 20 MG tablet Take 20 mg by mouth daily as needed for heartburn or indigestion.     fentaNYL (DURAGESIC - DOSED MCG/HR) 50 MCG/HR Place 50 mcg onto the skin every other day.     hydrOXYzine (ATARAX/VISTARIL) 50 MG tablet Take 50 mg by mouth at bedtime.     ibuprofen (ADVIL) 200 MG tablet Take 400 mg by mouth every 8 (eight) hours as needed (pain.).     lidocaine (XYLOCAINE) 5 % ointment Apply 1 Application topically daily as needed (pain.).     LUTEIN PO Take 40 mg by mouth at bedtime.     magnesium oxide (  MAG-OX) 400 (240 Mg) MG tablet Take 400 mg by mouth at bedtime.     naloxone (NARCAN) nasal spray 4 mg/0.1 mL Place into the nose.     polyethylene glycol powder (GLYCOLAX/MIRALAX) 17 GM/SCOOP powder Take 17 g by mouth daily as needed (constipation.).     No facility-administered medications prior to visit.     Per HPI unless specifically indicated in ROS section below Review of Systems  Constitutional:  Negative for chills.  Gastrointestinal:  Positive for nausea. Negative for vomiting.  Genitourinary:  Positive for dysuria, frequency and urgency. Negative for flank pain and hematuria.   Objective:  BP 130/80 (BP Location: Left Arm, Patient Position: Sitting, Cuff Size: Large)   Pulse 62   Temp (!) 97.5 F (36.4 C) (Temporal)   Ht 5' 3.5" (1.613 m)    Wt 189 lb 6 oz (85.9 kg)   SpO2 97%   BMI 33.02 kg/m   Wt Readings from Last 3 Encounters:  07/11/23 189 lb 6 oz (85.9 kg)  07/09/23 188 lb 9.6 oz (85.5 kg)  03/12/23 185 lb (83.9 kg)      Physical Exam Constitutional:      General: She is not in acute distress.    Appearance: Normal appearance. She is well-developed. She is not ill-appearing or toxic-appearing.  HENT:     Head: Normocephalic.     Right Ear: Hearing, tympanic membrane, ear canal and external ear normal. Tympanic membrane is not erythematous, retracted or bulging.     Left Ear: Hearing, tympanic membrane, ear canal and external ear normal. Tympanic membrane is not erythematous, retracted or bulging.     Nose: No mucosal edema or rhinorrhea.     Right Sinus: No maxillary sinus tenderness or frontal sinus tenderness.     Left Sinus: No maxillary sinus tenderness or frontal sinus tenderness.     Mouth/Throat:     Pharynx: Uvula midline.  Eyes:     General: Lids are normal. Lids are everted, no foreign bodies appreciated.     Conjunctiva/sclera: Conjunctivae normal.     Pupils: Pupils are equal, round, and reactive to light.  Neck:     Thyroid: No thyroid mass or thyromegaly.     Vascular: No carotid bruit.     Trachea: Trachea normal.  Cardiovascular:     Rate and Rhythm: Normal rate and regular rhythm.     Pulses: Normal pulses.     Heart sounds: Normal heart sounds, S1 normal and S2 normal. No murmur heard.    No friction rub. No gallop.  Pulmonary:     Effort: Pulmonary effort is normal. No tachypnea or respiratory distress.     Breath sounds: Normal breath sounds. No decreased breath sounds, wheezing, rhonchi or rales.  Abdominal:     General: Bowel sounds are normal.     Palpations: Abdomen is soft.     Tenderness: There is abdominal tenderness in the right lower quadrant, suprapubic area and left lower quadrant. There is no right CVA tenderness or left CVA tenderness.  Musculoskeletal:     Cervical back:  Normal range of motion and neck supple.  Skin:    General: Skin is warm and dry.     Findings: No rash.  Neurological:     Mental Status: She is alert.  Psychiatric:        Mood and Affect: Mood is not anxious or depressed.        Speech: Speech normal.  Behavior: Behavior normal. Behavior is cooperative.        Thought Content: Thought content normal.        Judgment: Judgment normal.       Results for orders placed or performed in visit on 07/11/23  POCT Urinalysis Dipstick (Automated)   Collection Time: 07/11/23  2:25 PM  Result Value Ref Range   Color, UA Yellow    Clarity, UA Hazy    Glucose, UA Negative Negative   Bilirubin, UA Negative    Ketones, UA Negative    Spec Grav, UA 1.015 1.010 - 1.025   Blood, UA Large (3+)    pH, UA 5.5 5.0 - 8.0   Protein, UA Positive (A) Negative   Urobilinogen, UA 0.2 0.2 or 1.0 E.U./dL   Nitrite, UA Negative    Leukocytes, UA Moderate (2+) (A) Negative  POCT UA - Microscopic Only   Collection Time: 07/11/23  2:41 PM  Result Value Ref Range   WBC, Ur, HPF, POC tntc 0 - 5   RBC, Urine, Miroscopic     Bacteria, U Microscopic     Mucus, UA     Epithelial cells, urine per micros     Crystals, Ur, HPF, POC     Casts, Ur, LPF, POC     Yeast, UA      Assessment and Plan  Dysuria -     POCT Urinalysis Dipstick (Automated) -     Urine Culture  Acute cystitis without hematuria Assessment & Plan: Acute, urinalysis positive for red blood cells, symptoms most consistent with acute cystitis in setting of interstitial cystitis causing more acute bladder pain. Will treat with cephalexin 500 mg 3 times daily x 7 days.  She will push water.  Return and ER precautions provided.  She will go to the emergency room if she has fever on antibiotics, any flank pain or worsening symptoms.  Orders: -     POCT UA - Microscopic Only  Other orders -     Cephalexin; Take 1 capsule (500 mg total) by mouth 3 (three) times daily.  Dispense: 30  capsule; Refill: 0    No follow-ups on file.   Kerby Nora, MD

## 2023-07-11 NOTE — Telephone Encounter (Signed)
 Appt scheduled with Dr. Ermalene Searing, will route to her and PCP as a FYI

## 2023-07-11 NOTE — Assessment & Plan Note (Signed)
 Acute, urinalysis positive for red blood cells, symptoms most consistent with acute cystitis in setting of interstitial cystitis causing more acute bladder pain. Will treat with cephalexin 500 mg 3 times daily x 7 days.  She will push water.  Return and ER precautions provided.  She will go to the emergency room if she has fever on antibiotics, any flank pain or worsening symptoms.

## 2023-07-11 NOTE — Telephone Encounter (Signed)
 Chief Complaint: pain with urination Symptoms: pain with urination, back pain, abd pain, urgency Frequency: 3-4 days Pertinent Negatives: Patient denies fever Disposition: [] ED /[] Urgent Care (no appt availability in office) / [x] Appointment(In office/virtual)/ []  [facility]/ [] Home Care/ [] Refused Recommended Disposition /[] [facility]/ []  Follow-up with PCP Additional Notes: Pt states that since about Tuesday she has been having urgency, frequency, painful urination with low back pain and also feels bloated. States that she does have Interstitial cystitis.  State urine has a odor as well.   Copied from CRM [id number]. Topic: Clinical - Red Word Triage >> Jul 11, 2023 10:50 AM Gurney Maxin H wrote: [facility] that prompted transfer to Nurse Triage: Pain while urinating, has odor, frequent urination, mid section swollen/bloated Reason for Disposition  Side (flank) or lower back pain present  Answer Assessment - Initial Assessment Questions 1. SEVERITY: "How bad is the pain?"  (e.g., Scale 1-10; mild, moderate, or severe)   - MILD (1-3): complains slightly about urination hurting   - MODERATE (4-7): interferes with normal activities     - SEVERE (8-10): excruciating, unwilling or unable to urinate because of the pain      6/10 2. FREQUENCY: "How many times have you had painful urination today?"      3 3. PATTERN: "Is pain present every time you urinate or just sometimes?"      Every times 4. ONSET: "When did the painful urination start?"      Tuesday 5. FEVER: "Do you have a fever?" If Yes, ask: "What is your temperature, how was it measured, and when did it start?"     no 6. PAST UTI: "Have you had a urine infection before?" If Yes, ask: "When was the last time?" and "What happened that time?"      Yes, has interstitial cystitis 7. CAUSE: "What do you think is causing the painful urination?"  (e.g., UTI, scratch, Herpes sore)     Interstitial cystitis  8. OTHER  SYMPTOMS: "Do you have any other symptoms?" (e.g., blood in urine, flank pain, genital sores, urgency, vaginal discharge)     Odor, frequency, abd cramping, urgency, back pain  Protocols used: Urination Pain - Female-A-AH

## 2023-07-13 LAB — URINE CULTURE
MICRO NUMBER:: 16319434
SPECIMEN QUALITY:: ADEQUATE

## 2023-07-15 ENCOUNTER — Encounter: Payer: Self-pay | Admitting: Family Medicine

## 2023-07-15 ENCOUNTER — Ambulatory Visit: Payer: Self-pay | Admitting: Genetic Counselor

## 2023-07-15 ENCOUNTER — Encounter: Payer: Self-pay | Admitting: Genetic Counselor

## 2023-07-15 DIAGNOSIS — Z1379 Encounter for other screening for genetic and chromosomal anomalies: Secondary | ICD-10-CM | POA: Insufficient documentation

## 2023-07-15 DIAGNOSIS — Z9189 Other specified personal risk factors, not elsewhere classified: Secondary | ICD-10-CM | POA: Insufficient documentation

## 2023-07-15 DIAGNOSIS — D126 Benign neoplasm of colon, unspecified: Secondary | ICD-10-CM

## 2023-07-15 DIAGNOSIS — Z860100 Personal history of colon polyps, unspecified: Secondary | ICD-10-CM

## 2023-07-15 HISTORY — DX: Other specified personal risk factors, not elsewhere classified: Z91.89

## 2023-07-15 NOTE — Progress Notes (Signed)
 HPI:   Lindsey [name] was previously seen in the [facility] due to a personal history of colon polyps and concerns regarding a hereditary predisposition to cancer.    Lindsey [name] recent genetic test results were disclosed to her by telephone. These results and recommendations are discussed in more detail below.  CANCER HISTORY:  Lindsey [name] is a 68 y.o. female with no personal history of cancer.  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  A patchy but extensive area of numerous sessile polyps in the proximal descending colon and in the distal transverse colon was also detected  Biopsies confirmed tubular adenomas.  She had a surgery consult with Dr. Inetta Fermo.  SCREENING/RISK FACTORS:  Mammogram within the last year: yes; most recent November 2024.  Category D density.  R breast biopsy in 2014--fibrocystic changes w/ sclerosing adenosis.  L breast biopsy in 2010-- fibroadenoma.  Hx of ~5 lumpectomies per patient.  Colonoscopy: yes;  see history noted above . Hysterectomy: yes; fibroids.  Ovaries intact: yes.  Menarche was at age 55 or 35.  Nulliparous.  Menopausal status: postmenopausal.  OCP use for approximately 1 year.  IUD for several years.  HRT use:  10  years  Stopped 10 years ago.  Estradiol.  Dermatology screening: previously.  No hx of oligodontia.     FAMILY HISTORY:  We obtained a detailed, 4-generation family history.  Significant diagnoses are listed below:      Family History  Problem Relation Age of Onset   Brain cancer Maternal Aunt          dx 67s; d. 36s   Brain cancer Maternal Uncle          dx 9s; d. 81s   Cancer Paternal Aunt          unknown type; d. late 60s-early 70s; mets   Melanoma Maternal Grandmother          dx >50   Stomach cancer Paternal Grandfather          dx 4s; d. 21s        Lindsey [name] is unaware of previous family history  of genetic testing for hereditary cancer risks. She has limited information about cousins.  Other relatives are unavailable for genetic testing at this time.  There is no reported family history of oligodontia.      There is no reported Ashkenazi Jewish ancestry. There is no known consanguinity.  GENETIC TEST RESULTS:  The [facility] CancerNext-Expanded +RNAinsight Panel found no pathogenic mutations.   The CancerNext-Expanded gene panel offered by [facility] and includes sequencing, rearrangement, and RNA analysis for the following 76 genes: AIP, ALK, APC, ATM, AXIN2, BAP1, BARD1, BMPR1A, BRCA1, BRCA2, BRIP1, CDC73, CDH1, CDK4, CDKN1B, CDKN2A, CEBPA, CHEK2, CTNNA1, DDX41, DICER1, ETV6, FH, FLCN, GATA2, LZTR1, MAX, MBD4, MEN1, MET, MLH1, MSH2, MSH3, MSH6, MUTYH, NF1, NF2, NTHL1, PALB2, PHOX2B, PMS2, POT1, PRKAR1A, PTCH1, PTEN, RAD51C, RAD51D, RB1, RET, RUNX1, SDHA, SDHAF2, SDHB, SDHC, SDHD, SMAD4, SMARCA4, SMARCB1, SMARCE1, STK11, SUFU, TMEM127, TP53, TSC1, TSC2, VHL, and WT1 (sequencing and deletion/duplication); EGFR, HOXB13, KIT, MITF, PDGFRA, POLD1, and POLE (sequencing only); EPCAM and GREM1 (deletion/duplication only).   The test report has been scanned into EPIC and is located under the Molecular Pathology section of the Results Review tab.  A portion of the result report is included below for reference. Genetic testing reported out on July 11, 2023.  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  At this time, it is unknown if this variant is associated with an increased risk for cancer or if it is benign, but most uncertain variants are reclassified to benign. It should not be used to make medical management decisions. With time, we suspect the laboratory will determine the significance of this variant, if any. If the laboratory reclassifies this variant, we will attempt to contact Lindsey [name] to discuss it further.   Even  though a pathogenic variant was not identified, possible explanations for the polyps/cancer in the family may include: There may be no hereditary risk for cancer in the family. The polyps in Lindsey [name] may be sporadic/familial or due to other genetic and environmental factors.  Most cancer is not hereditary.  There may be a gene mutation in one of these genes that current testing methods cannot detect but that chance is small. There could be another gene that has not yet been discovered, or that we have not yet tested, that is responsible for the cancer diagnoses in the family.  It is also possible there is a hereditary cause for the cancer in the family that Lindsey [name] did not inherit. The variant of uncertain significance detected in the AXIN2 gene may be reclassified as a pathogenic variant in the future. At this time, we do not know if this variant increases the risk for cancer/polyps.  Therefore, it is important to remain in touch with cancer genetics in the future so that we can continue to offer Lindsey [name] the most up to date genetic testing.   ADDITIONAL GENETIC TESTING:   Lindsey [name] genetic testing was fairly extensive.  If there are additional relevant genes identified to increase cancer risk that can be analyzed in the future, we would be happy to discuss and coordinate this testing at that time.     CANCER SCREENING RECOMMENDATIONS:  Lindsey [name] test result is considered negative (normal).  This means that we have not identified a hereditary cause for her personal history of colon polyps at this time.   An individual's cancer risk and medical management are not determined by genetic test results alone. Overall cancer risk assessment incorporates additional factors, including personal medical history, family history, and any available genetic information that may result in a personalized plan for cancer prevention and surveillance. Therefore, it is recommended she continue to follow the  cancer management and screening guidelines provided by her gastroenterology and primary healthcare provider.  Lindsey [name] has been determined to be at high risk for breast cancer.  Her lifetime risk for breast cancer based on Tyrer-Cuzick risk model is 20%.  This risk estimate can change over time and may be repeated to reflect new information in her personal or family history in the future.  For females with a greater than 20% lifetime risk of breast cancer, the [employer] (NCCN) recommends the following:  Clinical encounter every 6-12 months to begin when identified as being at increased risk, but not before age 44  Annual mammograms. Tomosynthesis is recommended starting 10 years earlier than the youngest breast cancer diagnosis in the family or at age 40 (whichever comes first), but not before age 66   Annual breast MRI starting 10 years earlier than the youngest breast cancer diagnosis in the family or at age 32 (whichever comes first), but not before age 25    In addition to a yearly  mammogram and physical exam by a healthcare provider, she should discuss the usefulness of an annual breast MRI with her primary care provider and/or the high-risk clinic providers.  Ms. Rojek is not interested in a referral to the high risk breast clinic at this time.  She prefers to speak with her PCP about the recommendation to consider breast MRIs first.   RECOMMENDATIONS FOR FAMILY MEMBERS:   Individuals in this family might be at some increased risk of developing cancer, over the general population risk, due to the family history of cancer/polyps.  Individuals in the family should notify their providers of the family history of cancer/colon polyps. Initiation age and frequency of colonoscopy should be modified based on clinical judgment taking account into first-degree relative's history with respect to age and cumulative adenoma burden. We do not recommend familial testing for the AXIN2  variant of uncertain significance (VUS).  FOLLOW-UP:  Cancer genetics is a rapidly advancing field and it is possible that new genetic tests will be appropriate for her and/or her family members in the future. We encourage Ms. Werst to remain in contact with cancer genetics, so we can update her personal and family histories and let her know of advances in cancer genetics that may benefit this family.   Our contact number was provided.  They are welcome to call us  at anytime with additional questions or concerns.   Eldrige Pitkin M. Ora Billing, MS, [facility] Genetic Counselor [name] (P) [phone]

## 2023-07-31 HISTORY — PX: OTHER SURGICAL HISTORY: SHX169

## 2023-08-29 LAB — HM COLONOSCOPY

## 2023-09-03 ENCOUNTER — Encounter: Payer: Self-pay | Admitting: Family Medicine

## 2023-09-03 DIAGNOSIS — N3010 Interstitial cystitis (chronic) without hematuria: Secondary | ICD-10-CM

## 2023-09-03 DIAGNOSIS — G894 Chronic pain syndrome: Secondary | ICD-10-CM

## 2023-09-04 DIAGNOSIS — G8929 Other chronic pain: Secondary | ICD-10-CM | POA: Insufficient documentation

## 2023-09-20 ENCOUNTER — Other Ambulatory Visit: Payer: Self-pay | Admitting: Physician Assistant

## 2023-09-22 NOTE — Telephone Encounter (Signed)
 Last Fill: 06/23/2023  Next Visit: 01/08/2024  Last Visit: 07/09/2023  Dx:  Fibromyalgia   Current Dose per office note on 07/09/2023: Cymbalta  60 mg 1 capsule daily.   Okay to refill Cymbalta ?

## 2023-10-27 ENCOUNTER — Encounter: Payer: Self-pay | Admitting: Family Medicine

## 2023-10-27 NOTE — Telephone Encounter (Signed)
 What does that mean?    They would not take her I need to get her in to pain management = did someone let her know?  or send this referral to another practice?  Please let me know what is going on ?

## 2023-10-28 NOTE — Telephone Encounter (Signed)
 I have sent message to [facility] referral team. They are looking into it and will reach out.

## 2023-10-28 NOTE — Telephone Encounter (Signed)
 It looks like they sent pt a letter in [place] with a number to call Please make sure she is aware of it  Thanks so much

## 2023-10-30 ENCOUNTER — Telehealth: Payer: Self-pay | Admitting: Family Medicine

## 2023-10-30 NOTE — Telephone Encounter (Signed)
 Received this message   Merilee Clarity [facility])  Dozier Berkovich, Laine LABOR, MD     [place], Previous Messages  ----- Message ----- From: Benancio Sheree Loving, MD [facility]) Sent: 10/29/2023   7:16 PM EDT To: Clarity Merilee, RN [facility]) Subject: RE: Referral on 10/29/2023  Thank you for the information/referral - not currently taking patients for opioid management (Fentanyl  Patch- reason for the referral) or chronic pain syndrome - Kindly - Sheree    Was this for first or 2nd try at the referral?  What is her status now?  Anywhere else to send it ?   Thanks

## 2023-10-31 NOTE — Telephone Encounter (Signed)
 Received a call back from [facility] [place].  They stated that the Provider would have to review the referral to see if able to accept this patient. They requested we fax the referral.   Faxed referral to [phone]  They will call me back once reviewed next week sometime to update on status

## 2023-10-31 NOTE — Telephone Encounter (Signed)
 [facility] declined this referral.   I reached out to [facility] to see if they are able to accept and they are at capacity and are unable to accept her patients at this time.   [facility] recommended [facility] [employer] and [facility] [place] Address: [address] [place], [place] [address] Phone: [phone]  I have called and left them a message to call me back.   As of now, this is still pending.

## 2023-10-31 NOTE — Telephone Encounter (Signed)
 Thanks so much.

## 2023-11-07 NOTE — Telephone Encounter (Signed)
 LM for [facility] to follow up on this referral. Requested they call me back on my direct line.

## 2023-11-10 NOTE — Telephone Encounter (Signed)
 Please check in with her and make sure she has an appointment  Thanks

## 2023-11-12 NOTE — Telephone Encounter (Signed)
 Called pt and gave her the pain clinic's # and advised her to call them to get appt scheduled

## 2023-11-19 ENCOUNTER — Ambulatory Visit (INDEPENDENT_AMBULATORY_CARE_PROVIDER_SITE_OTHER): Admitting: Family Medicine

## 2023-11-19 ENCOUNTER — Encounter: Payer: Self-pay | Admitting: Family Medicine

## 2023-11-19 VITALS — BP 132/80 | HR 59 | Temp 98.3°F | Ht 63.5 in | Wt 186.1 lb

## 2023-11-19 DIAGNOSIS — R300 Dysuria: Secondary | ICD-10-CM | POA: Diagnosis not present

## 2023-11-19 DIAGNOSIS — R4589 Other symptoms and signs involving emotional state: Secondary | ICD-10-CM | POA: Diagnosis not present

## 2023-11-19 DIAGNOSIS — N3010 Interstitial cystitis (chronic) without hematuria: Secondary | ICD-10-CM | POA: Diagnosis not present

## 2023-11-19 DIAGNOSIS — N3000 Acute cystitis without hematuria: Secondary | ICD-10-CM | POA: Diagnosis not present

## 2023-11-19 LAB — POC URINALSYSI DIPSTICK (AUTOMATED)
Bilirubin, UA: NEGATIVE
Blood, UA: 200 — AB
Glucose, UA: NEGATIVE
Ketones, UA: NEGATIVE
Nitrite, UA: NEGATIVE
Protein, UA: POSITIVE — AB
Spec Grav, UA: 1.015 (ref 1.010–1.025)
Urobilinogen, UA: 0.2 U/dL
pH, UA: 6 (ref 5.0–8.0)

## 2023-11-19 MED ORDER — NITROFURANTOIN MONOHYD MACRO 100 MG PO CAPS: 100.0000 mg | ORAL_CAPSULE | Freq: Two times a day (BID) | ORAL | 0 refills | Status: DC

## 2023-11-19 NOTE — Assessment & Plan Note (Addendum)
 Urinalysis with blood/ leuk and protein  Worse IC symptoms than usual /also some nausea  Last uti April- pan sens e coli -pt unsure if she got 100% with keflex  (hard to tell with IC)  Reviewed note and plan and labs from Dr Avelina at that time  Will treat with macrobid / 7 d  Pending culture Instructed to call if worse before that returns   Update if not starting to improve in a week or if worsening  Call back and Er precautions noted in detail today

## 2023-11-19 NOTE — Assessment & Plan Note (Addendum)
 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

## 2023-11-19 NOTE — Assessment & Plan Note (Signed)
 In the setting of chronic pain and other health problems  Takes cymbalta  60 mg daily from rheumatology   PHQ increased to 22 today/ no SI  More fatigue /altered sleep due to pain   Does have appointment with pain management clinic tomorrow

## 2023-11-19 NOTE — Patient Instructions (Signed)
 Drink lots of water  Try to get urine dilute   We will reach out with urine culture result when it returns If symptoms worsen give us  a call   Take the generic macrobid  twice daily as directed   Get established with pain clinic as planned   If you need help with referral for new urologist let us  know  Talk to the [facility] extender first

## 2023-11-19 NOTE — Progress Notes (Signed)
 Subjective:    Patient ID: Lindsey [name], female    DOB: July 17, 1955, 68 y.o.   MRN: [id number]  HPI  Wt Readings from Last 3 Encounters:  11/19/23 186 lb 2 oz (84.4 kg)  07/11/23 189 lb 6 oz (85.9 kg)  07/09/23 188 lb 9.6 oz (85.5 kg)   32.45 kg/m  Vitals:   11/19/23 1455  BP: 132/80  Pulse: (!) 59  Temp: 98.3 F (36.8 C)  SpO2: 96%   Pt presents with urinary symptoms   Dysuria -worse than usual  Urine odor  Maybe some blood- pink urine on tissue and unsure  Frequency and urgency   Some nausea No vomiting No fever  Some more low back pain (right) than usual   Has history of IC -so dysuria is common for her  On fentanyl  patch  From Dr Evans/urology who had to leave practice   Has new pain management appointment tomorrow    Last uti was April Pan sensitiv ecoli  Was treated with keflex    Lab Results  Component Value Date   NA 139 02/12/2023   K 4.9 02/12/2023   CO2 32 02/12/2023   GLUCOSE 108 (H) 02/12/2023   BUN 13 02/12/2023   CREATININE 0.87 02/12/2023   CALCIUM  10.6 (H) 02/12/2023   GFR 69.48 02/12/2023   EGFR 71 01/09/2023   GFRNONAA >60 04/15/2022   Urinalysis today  Results for orders placed or performed in visit on 11/19/23  POCT Urinalysis Dipstick (Automated)   Collection Time: 11/19/23  3:08 PM  Result Value Ref Range   Color, UA Yellow    Clarity, UA Cloudy    Glucose, UA Negative Negative   Bilirubin, UA Negative    Ketones, UA Negative    Spec Grav, UA 1.015 1.010 - 1.025   Blood, UA 200 Ery/uL (A)    pH, UA 6.0 5.0 - 8.0   Protein, UA Positive (A) Negative   Urobilinogen, UA 0.2 0.2 or 1.0 E.U./dL   Nitrite, UA Negative    Leukocytes, UA Large (3+) (A) Negative          11/19/2023    3:02 PM 03/12/2023    2:21 PM 02/12/2023    3:02 PM 03/04/2022   11:53 AM 02/28/2021   12:06 PM  Depression screen PHQ 2/9  Decreased Interest 3 3 3 2  0  Down, Depressed, Hopeless 1 2 2  0 0  PHQ - 2 Score 4 5 5 2  0  Altered sleeping 3  3  3    Tired, decreased energy 3 3  3    Change in appetite 3 0  0   Feeling bad or failure about yourself  3 0  0   Trouble concentrating 3 2  2    Moving slowly or fidgety/restless 3 0  0   Suicidal thoughts 0 0     PHQ-9 Score 22 13  10    Difficult doing work/chores  Somewhat difficult Somewhat difficult Not difficult at all       11/30/2019    2:44 PM  GAD 7 : Generalized Anxiety Score  Nervous, Anxious, on Edge 0  Control/stop worrying 0  Worry too much - different things 0  Trouble relaxing 0  Restless 0  Easily annoyed or irritable 0  Afraid - awful might happen 0  Total GAD 7 Score 0  Anxiety Difficulty Not difficult at all    History of depression Treated with cymbalta  60 mg daily by her rheumatologist/also for pain  Patient Active Problem List   Diagnosis Date Noted   Chronic pain 09/04/2023   At high risk for breast cancer 07/15/2023   Genetic testing 07/15/2023   Polyposis coli 03/17/2023   Hyperkalemia 02/12/2023   Colon cancer screening 11/06/2020   Subclinical hypothyroidism 05/29/2020   Acute cystitis without hematuria 03/10/2019   Prediabetes 08/09/2018   Estrogen deficiency 01/28/2018   Autoimmune disease (HCC) 06/18/2017   History of recurrent cystitis (IC) 08/16/2016   Pelvic floor dysfunction 08/16/2016   Dysuria 05/24/2016   Macular degeneration 04/09/2016   Osteoarthritis, knee 02/15/2016   S/P laparoscopic cholecystectomy June 2017 09/26/2015   Dermatitis 10/31/2014   Encounter for Medicare annual wellness exam 09/04/2012   HSV (herpes simplex virus) infection 04/08/2012   IBS (irritable bowel syndrome) 10/21/2011   Routine general medical examination at a health care facility 07/02/2011   Osteopenia 05/17/2009   Depressed mood 10/05/2008   GERD 05/05/2008   OSA (obstructive sleep apnea) 06/25/2007   Hyperlipidemia 05/31/2007   Essential hypertension 05/31/2007   Other fatigue 04/23/2007   Vitamin D  deficiency 01/22/2007    Migraine headache 01/22/2007   Interstitial cystitis 01/22/2007   MENOPAUSE-RELATED VASOMOTOR SYMPTOMS 01/22/2007   OSTEOARTHROSIS, GENERALIZED, MULTIPLE SITES 01/22/2007   Fibromyalgia 01/22/2007   Past Medical History:  Diagnosis Date   Anxiety    Arthritis    At high risk for breast cancer 07/15/2023   Cataract    bil - not ready for surgery   Chronic fatigue    Depression    Early onset macular degeneration    Bilateral   Fibromyalgia    GERD (gastroesophageal reflux disease)    Heart murmur    Hemorrhoids    Hyperglycemia 12/24/2013   Hyperlipidemia    Hypersomnia    Hypertension    IBS (irritable bowel syndrome)    Interstitial cystitis    Migraines    OSA (obstructive sleep apnea)    Osteopenia    Pelvic floor dysfunction    PFO (patent foramen ovale)    Pneumonia    Pre-diabetes    Sleep apnea    Tendonitis    hand   Tubular adenoma    Vitamin D  deficiency    Past Surgical History:  Procedure Laterality Date   ANKLE ARTHROSCOPY Right    ANKLE FRACTURE SURGERY Left    BLADDER SURGERY     and hydrodistension   BREAST EXCISIONAL BIOPSY Bilateral    Multiple Benign Lumps removed     BREAST LUMPECTOMY     many, bilateral   CERVICAL DISCECTOMY  2006   fusion and plating   CHOLECYSTECTOMY N/A 09/26/2015   Procedure: LAPAROSCOPIC CHOLECYSTECTOMY WITH INTRAOPERATIVE CHOLANGIOGRAM;  Surgeon: Donnice Lunger, MD;  Location: [facility]S;  Service: General;  Laterality: N/A;   COLONOSCOPY     COLONOSCOPY  03/2023   DRUG INDUCED ENDOSCOPY Bilateral 10/17/2021   Procedure: DRUG INDUCED ENDOSCOPY;  Surgeon: Carlie Clark, MD;  Location: [facility] OR;  Service: ENT;  Laterality: Bilateral;   EYE SURGERY     FRACTURE SURGERY     ankle fracture x 2   IMPLANTATION OF HYPOGLOSSAL NERVE STIMULATOR Right 01/22/2022   Procedure: IMPLANTATION OF HYPOGLOSSAL NERVE STIMULATOR;  Surgeon: Carlie Clark, MD;  Location: [facility];  Service: ENT;  Laterality: Right;    NASAL SINUS SURGERY     multiple, x4   SPINE SURGERY     UPPER GASTROINTESTINAL ENDOSCOPY     VAGINAL HYSTERECTOMY  1990   because of the IC  partial   Social History   Tobacco Use   Smoking status: Never    Passive exposure: Past   Smokeless tobacco: Never  Vaping Use   Vaping status: Never Used  Substance Use Topics   Alcohol use: No    Alcohol/week: 0.0 standard drinks of alcohol   Drug use: No   Family History  Problem Relation Age of Onset   Fibromyalgia Mother    Thyroid  disease Mother    Heart attack Father    Diabetes Father    Alcohol abuse Father    Heart disease Father    Depression Father    Hypertension Father    Obesity Father    Brain cancer Maternal Aunt        dx 74s; d. 34s   Brain cancer Maternal Uncle        dx 75s; d. 64s   Cancer Paternal Aunt        unknown type; d. late 60s-early 70s; mets   Melanoma Maternal Grandmother        dx >50   Diabetes Paternal Grandmother    Stroke Paternal Grandmother    Stomach cancer Paternal Grandfather        dx 15s; d. 81s   Diabetes Paternal Grandfather    Cancer Paternal Grandfather    Breast cancer Neg Hx    Rectal cancer Neg Hx    Colon polyps Neg Hx    Esophageal cancer Neg Hx    Allergies  Allergen Reactions   Buspirone Hcl Other (See Comments)   Crestor  [Rosuvastatin ]     Muscle pain     Simvastatin     REACTION: malaise   Sulfa Antibiotics Other (See Comments)    nausea   Sulfasalazine Nausea And Vomiting    nausea   Current Outpatient Medications on File Prior to Visit  Medication Sig Dispense Refill   atenolol  (TENORMIN ) 100 MG tablet TAKE 1 TABLET BY MOUTH EVERY DAY 90 tablet 2   baclofen (LIORESAL) 20 MG tablet Take 20 mg by mouth daily as needed for muscle spasms (as needed).     botulinum toxin Type A (BOTOX) 100 units SOLR injection Inject 200 Units into the muscle every 3 (three) months.     calcium  carbonate (TUMS - DOSED IN MG ELEMENTAL CALCIUM ) 500 MG chewable tablet Chew  1-2 tablets by mouth 2 (two) times daily as needed for indigestion or heartburn.     Cholecalciferol (VITAMIN D -3) 125 MCG (5000 UT) TABS Take 5,000 Units by mouth every evening.     diclofenac  sodium (VOLTAREN ) 1 % GEL Apply 2 to 4 gram to affected joints up to 4 times daily 400 g 2   docusate sodium (COLACE) 100 MG capsule Take 200 mg by mouth at bedtime.     DULoxetine  (CYMBALTA ) 60 MG capsule TAKE 1 CAPSULE BY MOUTH EVERY DAY 90 capsule 0   ezetimibe  (ZETIA ) 10 MG tablet TAKE 1 TABLET BY MOUTH EVERY DAY 90 tablet 2   famotidine  (PEPCID ) 20 MG tablet Take 20 mg by mouth daily as needed for heartburn or indigestion.     fentaNYL  (DURAGESIC  - DOSED MCG/HR) 50 MCG/HR Place 50 mcg onto the skin every other day.     hydrOXYzine (ATARAX/VISTARIL) 50 MG tablet Take 50 mg by mouth at bedtime.     ibuprofen (ADVIL) 200 MG tablet Take 400 mg by mouth every 8 (eight) hours as needed (pain.).     lidocaine  (XYLOCAINE ) 5 % ointment Apply 1 Application topically  daily as needed (pain.).     LUTEIN PO Take 40 mg by mouth at bedtime.     magnesium oxide (MAG-OX) 400 (240 Mg) MG tablet Take 400 mg by mouth at bedtime.     naloxone (NARCAN) nasal spray 4 mg/0.1 mL Place into the nose.     polyethylene glycol powder (GLYCOLAX/MIRALAX) 17 GM/SCOOP powder Take 17 g by mouth daily as needed (constipation.).     No current facility-administered medications on file prior to visit.    Review of Systems  Constitutional:  Positive for fatigue. Negative for activity change, appetite change and fever.  HENT:  Negative for congestion and sore throat.   Eyes:  Negative for itching and visual disturbance.  Respiratory:  Negative for cough and shortness of breath.   Cardiovascular:  Negative for leg swelling.  Gastrointestinal:  Negative for abdominal distention, abdominal pain, constipation, diarrhea and nausea.  Endocrine: Negative for cold intolerance and polydipsia.  Genitourinary:  Positive for dysuria, frequency,  hematuria and urgency. Negative for difficulty urinating and flank pain.  Musculoskeletal:  Negative for myalgias.  Skin:  Negative for rash.  Allergic/Immunologic: Negative for immunocompromised state.  Neurological:  Negative for dizziness and weakness.  Hematological:  Negative for adenopathy.       Objective:   Physical Exam Constitutional:      General: She is not in acute distress.    Appearance: Normal appearance. She is well-developed. She is obese. She is not ill-appearing or diaphoretic.  HENT:     Head: Normocephalic and atraumatic.     Mouth/Throat:     Mouth: Mucous membranes are moist.  Eyes:     General: No scleral icterus.    Conjunctiva/sclera: Conjunctivae normal.     Pupils: Pupils are equal, round, and reactive to light.  Cardiovascular:     Rate and Rhythm: Normal rate and regular rhythm.     Heart sounds: Normal heart sounds.  Pulmonary:     Effort: Pulmonary effort is normal. No respiratory distress.     Breath sounds: Normal breath sounds. No wheezing or rales.  Abdominal:     General: Bowel sounds are normal. There is no distension.     Palpations: Abdomen is soft. There is no mass.     Tenderness: There is no abdominal tenderness. There is no right CVA tenderness, left CVA tenderness, guarding or rebound.     Comments: No cva tenderness  Mild suprapubic tenderness  Musculoskeletal:     Cervical back: Normal range of motion and neck supple.     Comments: Tenderness in lumbar musculature   Lymphadenopathy:     Cervical: No cervical adenopathy.  Skin:    General: Skin is warm and dry.     Coloration: Skin is not pale.     Findings: No erythema or rash.  Neurological:     Mental Status: She is alert.  Psychiatric:        Mood and Affect: Mood is not anxious or depressed.        Speech: Speech normal.        Behavior: Behavior normal.     Comments: Mood is fair today despite high PHQ score Candid about chronic pain and stressors             Assessment & Plan:   Problem List Items Addressed This Visit       Genitourinary   Interstitial cystitis   Worsened recently pain wise / also has uti today  Pt's urologist Dr Janit  left practice due to medical condition  She has been on fentanyl  for years for this condition which causes severe pain   Has appointment with a pain clinic to get est tomorrow  Waiting on call from [facility] to get est with new urologist next mo  Will call if needing referral   Treating for uti as well today   Encouraged to keep urine dilute as much as possible          Acute cystitis without hematuria - Primary   Urinalysis with blood/ leuk and protein  Worse IC symptoms than usual /also some nausea  Last uti April- pan [name] e coli -pt unsure if she got 100% with keflex  (hard to tell with IC)  Reviewed note and plan and labs from Dr Avelina at that time  Will treat with macrobid / 7 d  Pending culture Instructed to call if worse before that returns   Update if not starting to improve in a week or if worsening  Call back and Er precautions noted in detail today        Relevant Orders   Urine Culture     Other   Dysuria   Uti today  Pend culture Treating with macrobid        Relevant Orders   POCT Urinalysis Dipstick (Automated) (Completed)   Depressed mood   In the setting of chronic pain and other health problems  Takes cymbalta  60 mg daily from rheumatology   PHQ increased to 22 today/ no SI  More fatigue /altered sleep due to pain   Does have appointment with pain management clinic tomorrow

## 2023-11-19 NOTE — Assessment & Plan Note (Signed)
 Uti today  Pend culture Treating with macrobid 

## 2023-11-22 ENCOUNTER — Ambulatory Visit: Payer: Self-pay | Admitting: Family Medicine

## 2023-11-22 LAB — URINE CULTURE
MICRO NUMBER:: 16858702
SPECIMEN QUALITY:: ADEQUATE

## 2023-11-22 MED ORDER — CEPHALEXIN 500 MG PO CAPS: 500.0000 mg | ORAL_CAPSULE | Freq: Two times a day (BID) | ORAL | 0 refills | Status: DC

## 2023-12-23 ENCOUNTER — Other Ambulatory Visit: Payer: Self-pay | Admitting: Physician Assistant

## 2023-12-23 NOTE — Telephone Encounter (Signed)
 Last Fill: 09/22/2023  Next Visit: 01/08/2024  Last Visit: 07/09/2023  Dx: Fibromyalgia   Current Dose per office note on 07/09/2023: Cymbalta  60 mg 1 capsule daily   Okay to refill Cymbalta ?

## 2023-12-27 ENCOUNTER — Other Ambulatory Visit: Payer: Self-pay | Admitting: Family Medicine

## 2023-12-29 NOTE — Progress Notes (Signed)
 Office Visit Note  Patient: Lindsey [name]             Date of Birth: [birth year]           MRN: [id number]             PCP: Randeen Laine LABOR, MD Referring: Tower, Laine LABOR, MD Visit Date: 01/08/2024 Occupation: Data Unavailable  Subjective:  Generalized pain and fatigue.  History of Present Illness: Lindsey [name] is a 68 y.o. female with fibromyalgia, osteoarthritis and degenerative disc disease.  She returns today after her last visit in April 2025.  She continues to have some discomfort in her hands, knees and her ankles.  She still has some generalized pain from fibromyalgia.  She states as Dr. Janit retired she has been going to pain management where she was switched from fentanyl  to ketamine.  She is tapering fentanyl .  She is felt improvement in her overall pain on ketamine.  She is also felt that her brain fog improved.  She states the inspire implant helped with the snoring but not much with insomnia.    Activities of Daily Living:  Patient reports morning stiffness for 24 hours.   Patient Reports nocturnal pain.  Difficulty dressing/grooming: Denies Difficulty climbing stairs: Reports Difficulty getting out of chair: Reports Difficulty using hands for taps, buttons, cutlery, and/or writing: Reports  Review of Systems  Constitutional:  Positive for fatigue.  HENT:  Positive for mouth dryness. Negative for mouth sores.   Eyes:  Positive for dryness.  Respiratory:  Positive for shortness of breath.   Cardiovascular:  Negative for chest pain and palpitations.  Gastrointestinal:  Positive for constipation. Negative for blood in stool and diarrhea.  Endocrine: Negative for increased urination.  Genitourinary:  Positive for involuntary urination.  Musculoskeletal:  Positive for joint pain, gait problem, joint pain, myalgias, muscle weakness, morning stiffness, muscle tenderness and myalgias. Negative for joint swelling.  Skin:  Positive for sensitivity to sunlight. Negative for color  change, rash and hair loss.  Allergic/Immunologic: Positive for susceptible to infections.  Neurological:  Positive for dizziness and headaches.  Hematological:  Positive for swollen glands.  Psychiatric/Behavioral:  Positive for sleep disturbance. Negative for depressed mood. The patient is nervous/anxious.     PMFS History:  Patient Active Problem List   Diagnosis Date Noted   Chronic pain 09/04/2023   At high risk for breast cancer 07/15/2023   Genetic testing 07/15/2023   Polyposis coli 03/17/2023   Hyperkalemia 02/12/2023   Colon cancer screening 11/06/2020   Subclinical hypothyroidism 05/29/2020   Acute cystitis without hematuria 03/10/2019   Prediabetes 08/09/2018   Estrogen deficiency 01/28/2018   Autoimmune disease 06/18/2017   History of recurrent cystitis (IC) 08/16/2016   Pelvic floor dysfunction 08/16/2016   Dysuria 05/24/2016   Macular degeneration 04/09/2016   Osteoarthritis, knee 02/15/2016   S/P laparoscopic cholecystectomy June 2017 09/26/2015   Dermatitis 10/31/2014   Encounter for Medicare annual wellness exam 09/04/2012   HSV (herpes simplex virus) infection 04/08/2012   IBS (irritable bowel syndrome) 10/21/2011   Routine general medical examination at a health care facility 07/02/2011   Osteopenia 05/17/2009   Depressed mood 10/05/2008   GERD 05/05/2008   OSA (obstructive sleep apnea) 06/25/2007   Hyperlipidemia 05/31/2007   Essential hypertension 05/31/2007   Other fatigue 04/23/2007   Vitamin D  deficiency 01/22/2007   Migraine headache 01/22/2007   Interstitial cystitis 01/22/2007   MENOPAUSE-RELATED VASOMOTOR SYMPTOMS 01/22/2007   OSTEOARTHROSIS, GENERALIZED, MULTIPLE SITES 01/22/2007  Fibromyalgia 01/22/2007    Past Medical History:  Diagnosis Date   Anxiety    Arthritis    At high risk for breast cancer 07/15/2023   Cataract    bil - not ready for surgery   Chronic fatigue    Depression    Early onset macular degeneration     Bilateral   Fibromyalgia    GERD (gastroesophageal reflux disease)    Heart murmur    Hemorrhoids    Hyperglycemia 12/24/2013   Hyperlipidemia    Hypersomnia    Hypertension    IBS (irritable bowel syndrome)    Interstitial cystitis    Migraines    OSA (obstructive sleep apnea)    Osteopenia    Pelvic floor dysfunction    PFO (patent foramen ovale)    Pneumonia    Pre-diabetes    Sleep apnea    Tendonitis    hand   Tubular adenoma    Vitamin D  deficiency     Family History  Problem Relation Age of Onset   Fibromyalgia Mother    Thyroid  disease Mother    Heart attack Father    Diabetes Father    Alcohol abuse Father    Heart disease Father    Depression Father    Hypertension Father    Obesity Father    Brain cancer Maternal Aunt        dx 24s; d. 24s   Brain cancer Maternal Uncle        dx 7s; d. 68s   Cancer Paternal Aunt        unknown type; d. late 60s-early 70s; mets   Melanoma Maternal Grandmother        dx >50   Diabetes Paternal Grandmother    Stroke Paternal Grandmother    Stomach cancer Paternal Grandfather        dx 60s; d. 3s   Diabetes Paternal Grandfather    Cancer Paternal Grandfather    Breast cancer Neg Hx    Rectal cancer Neg Hx    Colon polyps Neg Hx    Esophageal cancer Neg Hx    Past Surgical History:  Procedure Laterality Date   advanced colonoscopy  07/2023   ANKLE ARTHROSCOPY Right    ANKLE FRACTURE SURGERY Left    BLADDER SURGERY     and hydrodistension   BREAST EXCISIONAL BIOPSY Bilateral    Multiple Benign Lumps removed     BREAST LUMPECTOMY     many, bilateral   CERVICAL DISCECTOMY  2006   fusion and plating   CHOLECYSTECTOMY N/A 09/26/2015   Procedure: LAPAROSCOPIC CHOLECYSTECTOMY WITH INTRAOPERATIVE CHOLANGIOGRAM;  Surgeon: Donnice Lunger, MD;  Location: [facility] OR[facility];  Service: General;  Laterality: N/A;   COLONOSCOPY     COLONOSCOPY  03/2023   DRUG INDUCED ENDOSCOPY Bilateral 10/17/2021   Procedure: DRUG INDUCED  ENDOSCOPY;  Surgeon: Carlie Clark, MD;  Location: [facility];  Service: ENT;  Laterality: Bilateral;   EYE SURGERY     FRACTURE SURGERY     ankle fracture x 2   IMPLANTATION OF HYPOGLOSSAL NERVE STIMULATOR Right 01/22/2022   Procedure: IMPLANTATION OF HYPOGLOSSAL NERVE STIMULATOR;  Surgeon: Carlie Clark, MD;  Location: [facility];  Service: ENT;  Laterality: Right;   NASAL SINUS SURGERY     multiple, x4   SPINE SURGERY     UPPER GASTROINTESTINAL ENDOSCOPY     VAGINAL HYSTERECTOMY  1990   because of the IC partial   Social History   Tobacco  Use   Smoking status: Never    Passive exposure: Past   Smokeless tobacco: Never  Vaping Use   Vaping status: Never Used  Substance Use Topics   Alcohol use: No    Alcohol/week: 0.0 standard drinks of alcohol   Drug use: No   Social History   Social History Narrative   Not on file     Immunization History  Administered Date(s) Administered   INFLUENZA, HIGH DOSE SEASONAL PF 12/26/2021   Influenza Inj Mdck Quad Pf 12/16/2017   Influenza Whole 12/31/2006   Influenza,inj,Quad PF,6+ Mos 12/24/2013, 02/20/2016, 10/30/2016, 11/30/2016, 11/26/2018   Influenza-Unspecified 12/18/2022   PFIZER(Purple Top)SARS-COV-2 Vaccination 06/18/2019, 07/13/2019, 03/06/2020   PNEUMOCOCCAL CONJUGATE-20 01/07/2022   Pneumococcal Polysaccharide-23 09/04/2012   Td 04/23/2007   Tdap 06/17/2017     Objective: Vital Signs: BP 124/77   Pulse (!) 53   Temp 97.9 F (36.6 C)   Resp 14   Ht 5' 4 (1.626 m)   Wt 187 lb 3.2 oz (84.9 kg)   BMI 32.13 kg/m    Physical Exam Vitals and nursing note reviewed.  Constitutional:      Appearance: She is well-developed.  HENT:     Head: Normocephalic and atraumatic.  Eyes:     Conjunctiva/sclera: Conjunctivae normal.  Cardiovascular:     Rate and Rhythm: Normal rate and regular rhythm.     Heart sounds: Normal heart sounds.  Pulmonary:     Effort: Pulmonary effort is normal.     Breath sounds:  Normal breath sounds.  Abdominal:     General: Bowel sounds are normal.     Palpations: Abdomen is soft.  Musculoskeletal:     Cervical back: Normal range of motion.  Lymphadenopathy:     Cervical: No cervical adenopathy.  Skin:    General: Skin is warm and dry.     Capillary Refill: Capillary refill takes less than 2 seconds.  Neurological:     Mental Status: She is alert and oriented to person, place, and time.  Psychiatric:        Behavior: Behavior normal.      Musculoskeletal Exam: Cervical spine was in good range of motion.  She had limitation of range of motion of her thoracic and lumbar spine and tenderness over lower lumbar region.  There was SI joint tenderness.  Shoulder joints, elbow joints, wrist joints, MCPs, PIPs and DIPs were in good range of motion with no synovitis.  Hypermobility was noted with the joints.  Hip joints and knee joints were in good range of motion without any warmth swelling or effusion.  She had tenderness over bilateral trochanteric region.  There was no tenderness over ankles or MTPs.   CDAI Exam: CDAI Score: -- Patient Global: --; Provider Global: -- Swollen: --; Tender: -- Joint Exam 01/08/2024   No joint exam has been documented for this visit   There is currently no information documented on the homunculus. Go to the Rheumatology activity and complete the homunculus joint exam.  Investigation: No additional findings.  Imaging: No results found.  Recent Labs: Lab Results  Component Value Date   WBC 7.0 02/05/2023   HGB 14.6 02/05/2023   PLT 266.0 02/05/2023   NA 139 02/12/2023   K 4.9 02/12/2023   CL 99 02/12/2023   CO2 32 02/12/2023   GLUCOSE 108 (H) 02/12/2023   BUN 13 02/12/2023   CREATININE 0.87 02/12/2023   BILITOT 0.9 02/05/2023   ALKPHOS 77 02/05/2023   AST 18 02/05/2023  ALT 14 02/05/2023   PROT 6.9 02/05/2023   ALBUMIN 4.0 02/05/2023   CALCIUM  10.6 (H) 02/12/2023   GFRAA 98 04/23/2007    Speciality Comments:  No specialty comments available.  Procedures:  No procedures performed Allergies: Buspirone hcl, Crestor  [rosuvastatin ], Simvastatin, Sulfa antibiotics, and Sulfasalazine   Assessment / Plan:     Visit Diagnoses: Fibromyalgia-she continues to have generalized pain and discomfort from fibromyalgia.  Positive tender points and hyperalgesia was noted.  Patient states she is going to a pain management center where fentanyl  has been tapered and she started on ketamine.  She is feeling better on ketamine.  She states her pain level is manageable.  Primary insomnia - Underwent nocturnal polysomnography on 02/14/2020 followed by a home sleep test on 04/19/2020. Patient had inspire implant placement in October 2023.  She noticed improvement in snoring but not so much insomnia.  Other fatigue-related to fibromyalgia and chronic insomnia.  Primary osteoarthritis of both hands-no significant PIP or DIP thickening was noted.  No synovitis was noted.  Trochanteric bursitis of both hips-she had tenderness over bilateral trochanteric bursa.  IT band stretches were advised.  Chondromalacia of both patellae-she has off-and-on discomfort in her knee joints.  Osteopenia of multiple sites - DEXA 12/24/2021 T-score -1.7, BMD 0.807 right femoral neck.  DEXA scan is followed by her PCP.  Calcium  rich diet vitamin D  was advised.  History of vitamin D  deficiency-she is on vitamin D  supplement.  Elevated thrombocytosis follows:  Interstitial cystitis  History of hypertension-blood pressure was normal at 124/77.  History of hyperlipidemia  History of hyperglycemia  History of depression - Cymbalta  60 mg 1 capsule by mouth daily.  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 Avram.  Referred to geneticist as well as a specialist at [facility].  OSA (obstructive sleep apnea)  Orders: No orders of the defined types were placed in this  encounter.  No orders of the defined types were placed in this encounter.   Follow-Up Instructions: Return in about 1 year (around 01/07/2025) for Osteoarthritis.   Maya Nash, MD  Note - This record has been created using Animal nutritionist.  Chart creation errors have been sought, but may not always  have been located. Such creation errors do not reflect on  the standard of medical care.

## 2024-01-08 ENCOUNTER — Encounter: Payer: Self-pay | Admitting: Rheumatology

## 2024-01-08 ENCOUNTER — Ambulatory Visit: Attending: Rheumatology | Admitting: Rheumatology

## 2024-01-08 VITALS — BP 124/77 | HR 53 | Temp 97.9°F | Resp 14 | Ht 64.0 in | Wt 187.2 lb

## 2024-01-08 DIAGNOSIS — M797 Fibromyalgia: Secondary | ICD-10-CM | POA: Diagnosis not present

## 2024-01-08 DIAGNOSIS — Z8659 Personal history of other mental and behavioral disorders: Secondary | ICD-10-CM | POA: Insufficient documentation

## 2024-01-08 DIAGNOSIS — R5383 Other fatigue: Secondary | ICD-10-CM | POA: Diagnosis not present

## 2024-01-08 DIAGNOSIS — Z8679 Personal history of other diseases of the circulatory system: Secondary | ICD-10-CM | POA: Insufficient documentation

## 2024-01-08 DIAGNOSIS — M2241 Chondromalacia patellae, right knee: Secondary | ICD-10-CM | POA: Insufficient documentation

## 2024-01-08 DIAGNOSIS — Z9049 Acquired absence of other specified parts of digestive tract: Secondary | ICD-10-CM | POA: Insufficient documentation

## 2024-01-08 DIAGNOSIS — M8589 Other specified disorders of bone density and structure, multiple sites: Secondary | ICD-10-CM | POA: Insufficient documentation

## 2024-01-08 DIAGNOSIS — M2242 Chondromalacia patellae, left knee: Secondary | ICD-10-CM | POA: Diagnosis present

## 2024-01-08 DIAGNOSIS — G4733 Obstructive sleep apnea (adult) (pediatric): Secondary | ICD-10-CM | POA: Insufficient documentation

## 2024-01-08 DIAGNOSIS — M65312 Trigger thumb, left thumb: Secondary | ICD-10-CM

## 2024-01-08 DIAGNOSIS — M7062 Trochanteric bursitis, left hip: Secondary | ICD-10-CM | POA: Diagnosis present

## 2024-01-08 DIAGNOSIS — Z8639 Personal history of other endocrine, nutritional and metabolic disease: Secondary | ICD-10-CM | POA: Diagnosis present

## 2024-01-08 DIAGNOSIS — M7061 Trochanteric bursitis, right hip: Secondary | ICD-10-CM | POA: Insufficient documentation

## 2024-01-08 DIAGNOSIS — Z8719 Personal history of other diseases of the digestive system: Secondary | ICD-10-CM | POA: Insufficient documentation

## 2024-01-08 DIAGNOSIS — M19041 Primary osteoarthritis, right hand: Secondary | ICD-10-CM | POA: Diagnosis present

## 2024-01-08 DIAGNOSIS — N3010 Interstitial cystitis (chronic) without hematuria: Secondary | ICD-10-CM | POA: Diagnosis present

## 2024-01-08 DIAGNOSIS — D1391 Familial adenomatous polyposis: Secondary | ICD-10-CM | POA: Diagnosis present

## 2024-01-08 DIAGNOSIS — M19042 Primary osteoarthritis, left hand: Secondary | ICD-10-CM | POA: Diagnosis present

## 2024-01-08 DIAGNOSIS — F5101 Primary insomnia: Secondary | ICD-10-CM | POA: Diagnosis not present

## 2024-03-10 ENCOUNTER — Encounter: Payer: Self-pay | Admitting: Family Medicine

## 2024-03-10 ENCOUNTER — Ambulatory Visit: Admitting: Family Medicine

## 2024-03-10 VITALS — BP 131/79 | HR 57 | Temp 98.0°F | Ht 64.0 in | Wt 186.4 lb

## 2024-03-10 DIAGNOSIS — E559 Vitamin D deficiency, unspecified: Secondary | ICD-10-CM

## 2024-03-10 DIAGNOSIS — K589 Irritable bowel syndrome without diarrhea: Secondary | ICD-10-CM

## 2024-03-10 DIAGNOSIS — E038 Other specified hypothyroidism: Secondary | ICD-10-CM | POA: Diagnosis not present

## 2024-03-10 DIAGNOSIS — N3010 Interstitial cystitis (chronic) without hematuria: Secondary | ICD-10-CM | POA: Diagnosis not present

## 2024-03-10 DIAGNOSIS — M797 Fibromyalgia: Secondary | ICD-10-CM | POA: Diagnosis not present

## 2024-03-10 DIAGNOSIS — I10 Essential (primary) hypertension: Secondary | ICD-10-CM | POA: Diagnosis not present

## 2024-03-10 DIAGNOSIS — R5382 Chronic fatigue, unspecified: Secondary | ICD-10-CM

## 2024-03-10 DIAGNOSIS — R7303 Prediabetes: Secondary | ICD-10-CM

## 2024-03-10 DIAGNOSIS — R5383 Other fatigue: Secondary | ICD-10-CM | POA: Insufficient documentation

## 2024-03-10 NOTE — Patient Instructions (Addendum)
 Take care of yourself   Increase your fluid intake (48 oz minimum daily) Keep eating protein   Lab today for fatigue and pain  Also average blood sugar Thyroid   Vitamin levels    Watch for fever or new symptoms

## 2024-03-10 NOTE — Progress Notes (Signed)
 Subjective:    Patient ID: Lindsey [name], female    DOB: 04/09/1955, 68 y.o.   MRN: [id number]  HPI  Wt Readings from Last 3 Encounters:  03/10/24 186 lb 6 oz (84.5 kg)  01/08/24 187 lb 3.2 oz (84.9 kg)  11/19/23 186 lb 2 oz (84.4 kg)   31.99 kg/m  Vitals:   03/10/24 1459 03/10/24 1527  BP: (!) 144/90 131/79  Pulse: (!) 57   Temp: 98 F (36.7 C)   SpO2: 100%    Pt presents with c/o  Fatigue GI problems  Increased chronic pain  (in pain clinic)     Dr Janit retired (urol/ for IC) and she has been going to pain management  Tapered fentanyl  On ketamine   Was doing fine with fentanyl  taper at first  Right before TG holiday (several days after dec dose) , noticed her legs hurt badly to feet  Then arms  Then felt weak all over  This lead to being in bed for weeks   More headaches Sweating  Upset stomach with diarrhea- about 2 weeks   Wonders about thyroid     Has aspire for OSA The implant bothers her  Feels like there is something in back of throat all the time   (uses lidocaine  patches for that)   Feels stressed  Behind on everything due to not functioning well   New urology  [place] for uti prevention -cannot get filled/no one has it in stock right now  Does not have uti right now    Sees Dr Bernadine for fibromyalgia and joint pain from OA  Mental health wise Talks to husband On cymbalta  60 mg  Thinks ketamine was helping mood initially   Had ketamine yesterday  Is in touch with her pain clinic   Needs more fluids Not much appetite   Tries to get protein   Had to re schedule colonoscopy Felt too bad    Patient Active Problem List   Diagnosis Date Noted   Fatigue 03/10/2024   Chronic pain 09/04/2023   At high risk for breast cancer 07/15/2023   Genetic testing 07/15/2023   Polyposis coli 03/17/2023   Hyperkalemia 02/12/2023   Colon cancer screening 11/06/2020   Subclinical hypothyroidism 05/29/2020   Prediabetes 08/09/2018    Estrogen deficiency 01/28/2018   Autoimmune disease 06/18/2017   History of recurrent cystitis (IC) 08/16/2016   Pelvic floor dysfunction 08/16/2016   Dysuria 05/24/2016   Macular degeneration 04/09/2016   Osteoarthritis, knee 02/15/2016   S/P laparoscopic cholecystectomy June 2017 09/26/2015   Dermatitis 10/31/2014   Encounter for Medicare annual wellness exam 09/04/2012   HSV (herpes simplex virus) infection 04/08/2012   IBS (irritable bowel syndrome) 10/21/2011   Routine general medical examination at a health care facility 07/02/2011   Osteopenia 05/17/2009   Depressed mood 10/05/2008   GERD 05/05/2008   OSA (obstructive sleep apnea) 06/25/2007   Hyperlipidemia 05/31/2007   Essential hypertension 05/31/2007   Other fatigue 04/23/2007   Vitamin D  deficiency 01/22/2007   Migraine headache 01/22/2007   Interstitial cystitis 01/22/2007   MENOPAUSE-RELATED VASOMOTOR SYMPTOMS 01/22/2007   OSTEOARTHROSIS, GENERALIZED, MULTIPLE SITES 01/22/2007   Fibromyalgia 01/22/2007   Past Medical History:  Diagnosis Date   Anxiety    Arthritis    At high risk for breast cancer 07/15/2023   Cataract    bil - not ready for surgery   Chronic fatigue    Depression    Early onset macular degeneration    Bilateral  Fibromyalgia    GERD (gastroesophageal reflux disease)    Heart murmur    Hemorrhoids    Hyperglycemia 12/24/2013   Hyperlipidemia    Hypersomnia    Hypertension    IBS (irritable bowel syndrome)    Interstitial cystitis    Migraines    OSA (obstructive sleep apnea)    Osteopenia    Pelvic floor dysfunction    PFO (patent foramen ovale)    Pneumonia    Pre-diabetes    Sleep apnea    Tendonitis    hand   Tubular adenoma    Vitamin D  deficiency    Past Surgical History:  Procedure Laterality Date   advanced colonoscopy  07/2023   ANKLE ARTHROSCOPY Right    ANKLE FRACTURE SURGERY Left    BLADDER SURGERY     and hydrodistension   BREAST EXCISIONAL BIOPSY  Bilateral    Multiple Benign Lumps removed     BREAST LUMPECTOMY     many, bilateral   CERVICAL DISCECTOMY  2006   fusion and plating   CHOLECYSTECTOMY N/A 09/26/2015   Procedure: LAPAROSCOPIC CHOLECYSTECTOMY WITH INTRAOPERATIVE CHOLANGIOGRAM;  Surgeon: Donnice Lunger, MD;  Location: [facility] ORS;  Service: General;  Laterality: N/A;   COLONOSCOPY     COLONOSCOPY  03/2023   DRUG INDUCED ENDOSCOPY Bilateral 10/17/2021   Procedure: DRUG INDUCED ENDOSCOPY;  Surgeon: Carlie Clark, MD;  Location: [facility] OR;  Service: ENT;  Laterality: Bilateral;   EYE SURGERY     FRACTURE SURGERY     ankle fracture x 2   IMPLANTATION OF HYPOGLOSSAL NERVE STIMULATOR Right 01/22/2022   Procedure: IMPLANTATION OF HYPOGLOSSAL NERVE STIMULATOR;  Surgeon: Carlie Clark, MD;  Location: [facility];  Service: ENT;  Laterality: Right;   NASAL SINUS SURGERY     multiple, x4   SPINE SURGERY     UPPER GASTROINTESTINAL ENDOSCOPY     VAGINAL HYSTERECTOMY  1990   because of the IC partial   Social History   Tobacco Use   Smoking status: Never    Passive exposure: Past   Smokeless tobacco: Never  Vaping Use   Vaping status: Never Used  Substance Use Topics   Alcohol use: No    Alcohol/week: 0.0 standard drinks of alcohol   Drug use: No   Family History  Problem Relation Age of Onset   Fibromyalgia Mother    Thyroid  disease Mother    Heart attack Father    Diabetes Father    Alcohol abuse Father    Heart disease Father    Depression Father    Hypertension Father    Obesity Father    Brain cancer Maternal Aunt        dx 69s; d. 32s   Brain cancer Maternal Uncle        dx 43s; d. 42s   Cancer Paternal Aunt        unknown type; d. late 60s-early 70s; mets   Melanoma Maternal Grandmother        dx >50   Diabetes Paternal Grandmother    Stroke Paternal Grandmother    Stomach cancer Paternal Grandfather        dx 45s; d. 19s   Diabetes Paternal Grandfather    Cancer Paternal Grandfather     Breast cancer Neg Hx    Rectal cancer Neg Hx    Colon polyps Neg Hx    Esophageal cancer Neg Hx    Allergies  Allergen Reactions   Buspirone Hcl Other (See Comments)  Crestor  [Rosuvastatin ]     Muscle pain     Simvastatin     REACTION: malaise   Sulfa Antibiotics Other (See Comments)    nausea   Sulfasalazine Nausea And Vomiting    nausea   Current Outpatient Medications on File Prior to Visit  Medication Sig Dispense Refill   atenolol  (TENORMIN ) 100 MG tablet TAKE 1 TABLET BY MOUTH EVERY DAY 90 tablet 2   baclofen (LIORESAL) 20 MG tablet Take 20 mg by mouth daily as needed for muscle spasms (as needed).     botulinum toxin Type A (BOTOX) 100 units SOLR injection Inject 200 Units into the muscle every 3 (three) months.     calcium  carbonate (TUMS - DOSED IN MG ELEMENTAL CALCIUM ) 500 MG chewable tablet Chew 1-2 tablets by mouth 2 (two) times daily as needed for indigestion or heartburn.     Cholecalciferol (VITAMIN D -3) 125 MCG (5000 UT) TABS Take 5,000 Units by mouth every evening.     diclofenac  sodium (VOLTAREN ) 1 % GEL Apply 2 to 4 gram to affected joints up to 4 times daily 400 g 2   docusate sodium (COLACE) 100 MG capsule Take 200 mg by mouth at bedtime.     DULoxetine  (CYMBALTA ) 60 MG capsule TAKE 1 CAPSULE BY MOUTH EVERY DAY 90 capsule 0   ezetimibe  (ZETIA ) 10 MG tablet TAKE 1 TABLET BY MOUTH EVERY DAY 90 tablet 2   famotidine  (PEPCID ) 20 MG tablet Take 20 mg by mouth daily as needed for heartburn or indigestion.     fentaNYL  (DURAGESIC ) 12 MCG/HR 1 patch every other day.     hydrOXYzine (ATARAX/VISTARIL) 50 MG tablet Take 50 mg by mouth at bedtime.     ibuprofen (ADVIL) 200 MG tablet Take 400 mg by mouth every 8 (eight) hours as needed (pain.).     lidocaine  (XYLOCAINE ) 5 % ointment Apply 1 Application topically daily as needed (pain.).     LUTEIN PO Take 40 mg by mouth at bedtime.     magnesium oxide (MAG-OX) 400 (240 Mg) MG tablet Take 400 mg by mouth at bedtime.      methenamine (HIPREX) 1 g tablet Take 1 g by mouth.     naloxone (NARCAN) nasal spray 4 mg/0.1 mL Place into the nose.     polyethylene glycol powder (GLYCOLAX/MIRALAX) 17 GM/SCOOP powder Take 17 g by mouth daily as needed (constipation.).     No current facility-administered medications on file prior to visit.    Review of Systems  Constitutional:  Positive for fatigue. Negative for activity change, appetite change, fever and unexpected weight change.  HENT:  Negative for congestion, ear pain, rhinorrhea, sinus pressure and sore throat.        Is aware of aspire implant in neck and it is uncomfortable  Eyes:  Negative for pain, redness and visual disturbance.  Respiratory:  Negative for cough, shortness of breath and wheezing.   Cardiovascular:  Negative for chest pain and palpitations.  Gastrointestinal:  Positive for diarrhea. Negative for abdominal pain, blood in stool and constipation.  Endocrine: Negative for polydipsia and polyuria.  Genitourinary:  Positive for frequency. Negative for dysuria, hematuria and urgency.  Musculoskeletal:  Positive for arthralgias and myalgias. Negative for back pain and joint swelling.  Skin:  Negative for pallor and rash.  Allergic/Immunologic: Negative for environmental allergies.  Neurological:  Positive for headaches. Negative for dizziness and syncope.  Hematological:  Negative for adenopathy. Does not bruise/bleed easily.  Psychiatric/Behavioral:  Negative for decreased  concentration and dysphoric mood. The patient is not nervous/anxious.        Objective:   Physical Exam Constitutional:      General: She is not in acute distress.    Appearance: Normal appearance. She is well-developed. She is obese. She is not ill-appearing or diaphoretic.  HENT:     Head: Normocephalic and atraumatic.     Mouth/Throat:     Mouth: Mucous membranes are moist.  Eyes:     General: No scleral icterus.    Conjunctiva/sclera: Conjunctivae normal.     Pupils:  Pupils are equal, round, and reactive to light.  Neck:     Thyroid : No thyromegaly.     Vascular: No carotid bruit or JVD.  Cardiovascular:     Rate and Rhythm: Normal rate and regular rhythm.     Heart sounds: Normal heart sounds.     No gallop.  Pulmonary:     Effort: Pulmonary effort is normal. No respiratory distress.     Breath sounds: Normal breath sounds. No stridor. No wheezing, rhonchi or rales.  Abdominal:     General: There is no distension or abdominal bruit.     Palpations: Abdomen is soft. There is no mass.     Tenderness: There is no guarding or rebound.     Hernia: No hernia is present.     Comments: Mildly tender over entire abdomen w/o rebound or guarding This is baseline  Musculoskeletal:     Cervical back: Normal range of motion and neck supple.     Right lower leg: No edema.     Left lower leg: No edema.     Comments: No focal joint swelling   Myofacial trigger points noted   Lymphadenopathy:     Cervical: No cervical adenopathy.  Skin:    General: Skin is warm and dry.     Coloration: Skin is not pale.     Findings: No bruising, erythema or rash.  Neurological:     Mental Status: She is alert.     Cranial Nerves: No cranial nerve deficit.     Coordination: Coordination normal.     Deep Tendon Reflexes: Reflexes are normal and symmetric. Reflexes normal.  Psychiatric:        Attention and Perception: Attention normal.        Mood and Affect: Mood is depressed.        Cognition and Memory: Cognition and memory normal.     Comments: Candidly discusses symptoms and stressors             Assessment & Plan:   Problem List Items Addressed This Visit       Cardiovascular and Mediastinum   Essential hypertension   bp in fair control at this time (better on 2nd check)  BP Readings from Last 1 Encounters:  03/10/24 131/79   No changes needed Most recent labs reviewed  Disc lifstyle change with low sodium diet and exercise   Atenolol  100 mg  daily        Relevant Orders   CBC with Differential/Platelet   TSH   Comprehensive metabolic panel with GFR     Digestive   IBS (irritable bowel syndrome)   Worsened lately in setting of  Worse myofacial pain syndrome and fatigue  Weaning fentanyl  with pain mgt        Endocrine   Subclinical hypothyroidism   More fatigued and worsened chronic pain  Reassuring exam   Thyroid  labs today  Open to  treatment if needed at this time       Relevant Orders   T4, free   TSH     Genitourinary   Interstitial cystitis   Seeing urology and pain management  Weaning fentanyl   On ketamine  Urinary symptoms seem overall quiet to improved         Other   Vitamin D  deficiency   D level today      Relevant Orders   VITAMIN D  25 Hydroxy (Vit-D Deficiency, Fractures)   Prediabetes   A1c today        Relevant Orders   Hemoglobin A1c   Fibromyalgia - Primary   Recently worse (since visit with rheumatology) More pain/more sensitive to stimuli including her aspire implant Worse HA and IBS symptoms also as well as fatigue No fever or constitutional symptoms  In process of fentanyl  wean from pain management (doing well prior however) and with ketamine treatment (which was helping) Continues cymbalta  60 mg daily Reassuring exam   Labs pending Encouraged to update rheumatology           Fatigue   May be multi factorial  In setting of  recently worse chronic pain and other medical problems  Increased sensitivity to pain , in process of weaning from fentanyl  and taking keppra  Also increased stress level   Reassuring exam Labs today          Relevant Orders   VITAMIN D  25 Hydroxy (Vit-D Deficiency, Fractures)   T4, free   CBC with Differential/Platelet   Vitamin B12   TSH   Comprehensive metabolic panel with GFR

## 2024-03-10 NOTE — Assessment & Plan Note (Signed)
 Worsened lately in setting of  Worse myofacial pain syndrome and fatigue  Weaning fentanyl  with pain mgt

## 2024-03-10 NOTE — Assessment & Plan Note (Signed)
-

## 2024-03-10 NOTE — Assessment & Plan Note (Signed)
 Seeing urology and pain management  Weaning fentanyl   On ketamine  Urinary symptoms seem overall quiet to improved

## 2024-03-10 NOTE — Assessment & Plan Note (Signed)
 D level today

## 2024-03-10 NOTE — Assessment & Plan Note (Signed)
 bp in fair control at this time (better on 2nd check)  BP Readings from Last 1 Encounters:  03/10/24 131/79   No changes needed Most recent labs reviewed  Disc lifstyle change with low sodium diet and exercise   Atenolol  100 mg daily

## 2024-03-10 NOTE — Assessment & Plan Note (Signed)
 Recently worse (since visit with rheumatology) More pain/more sensitive to stimuli including her aspire implant Worse HA and IBS symptoms also as well as fatigue No fever or constitutional symptoms  In process of fentanyl  wean from pain management (doing well prior however) and with ketamine treatment (which was helping) Continues cymbalta  60 mg daily Reassuring exam   Labs pending Encouraged to update rheumatology

## 2024-03-10 NOTE — Assessment & Plan Note (Signed)
 May be multi factorial  In setting of  recently worse chronic pain and other medical problems  Increased sensitivity to pain , in process of weaning from fentanyl  and taking keppra  Also increased stress level   Reassuring exam Labs today

## 2024-03-10 NOTE — Assessment & Plan Note (Signed)
 More fatigued and worsened chronic pain  Reassuring exam   Thyroid  labs today  Open to treatment if needed at this time

## 2024-03-11 ENCOUNTER — Ambulatory Visit: Payer: Self-pay | Admitting: Family Medicine

## 2024-03-11 DIAGNOSIS — E538 Deficiency of other specified B group vitamins: Secondary | ICD-10-CM | POA: Insufficient documentation

## 2024-03-11 LAB — CBC WITH DIFFERENTIAL/PLATELET
Basophils Absolute: 0.1 K/uL (ref 0.0–0.1)
Basophils Relative: 0.9 % (ref 0.0–3.0)
Eosinophils Absolute: 0.2 K/uL (ref 0.0–0.7)
Eosinophils Relative: 2.5 % (ref 0.0–5.0)
HCT: 44.3 % (ref 36.0–46.0)
Hemoglobin: 15 g/dL (ref 12.0–15.0)
Lymphocytes Relative: 32.3 % (ref 12.0–46.0)
Lymphs Abs: 2.5 K/uL (ref 0.7–4.0)
MCHC: 33.9 g/dL (ref 30.0–36.0)
MCV: 88.1 fl (ref 78.0–100.0)
Monocytes Absolute: 0.5 K/uL (ref 0.1–1.0)
Monocytes Relative: 5.9 % (ref 3.0–12.0)
Neutro Abs: 4.6 K/uL (ref 1.4–7.7)
Neutrophils Relative %: 58.4 % (ref 43.0–77.0)
Platelets: 273 K/uL (ref 150.0–400.0)
RBC: 5.03 Mil/uL (ref 3.87–5.11)
RDW: 14.1 % (ref 11.5–15.5)
WBC: 7.9 K/uL (ref 4.0–10.5)

## 2024-03-11 LAB — COMPREHENSIVE METABOLIC PANEL WITH GFR
ALT: 13 U/L (ref 0–35)
AST: 13 U/L (ref 0–37)
Albumin: 4.4 g/dL (ref 3.5–5.2)
Alkaline Phosphatase: 70 U/L (ref 39–117)
BUN: 14 mg/dL (ref 6–23)
CO2: 32 meq/L (ref 19–32)
Calcium: 10.1 mg/dL (ref 8.4–10.5)
Chloride: 100 meq/L (ref 96–112)
Creatinine, Ser: 0.86 mg/dL (ref 0.40–1.20)
GFR: 69.92 mL/min (ref >60.00)
Glucose, Bld: 108 mg/dL — ABNORMAL HIGH (ref 70–99)
Potassium: 5.3 meq/L — ABNORMAL HIGH (ref 3.5–5.1)
Sodium: 137 meq/L (ref 135–145)
Total Bilirubin: 0.5 mg/dL (ref 0.2–1.2)
Total Protein: 6.9 g/dL (ref 6.0–8.3)

## 2024-03-11 LAB — HEMOGLOBIN A1C: Hgb A1c MFr Bld: 6.1 % (ref 4.6–6.5)

## 2024-03-11 LAB — T4, FREE: Free T4: 0.71 ng/dL (ref 0.60–1.60)

## 2024-03-11 LAB — TSH: TSH: 3.19 u[IU]/mL (ref 0.35–5.50)

## 2024-03-11 LAB — VITAMIN D 25 HYDROXY (VIT D DEFICIENCY, FRACTURES): VITD: 29.19 ng/mL — ABNORMAL LOW (ref 30.00–100.00)

## 2024-03-11 LAB — VITAMIN B12: Vitamin B-12: 168 pg/mL — ABNORMAL LOW (ref 211–911)

## 2024-03-12 ENCOUNTER — Ambulatory Visit: Payer: Medicare Other

## 2024-03-12 VITALS — Ht 64.0 in | Wt 186.0 lb

## 2024-03-12 DIAGNOSIS — Z0000 Encounter for general adult medical examination without abnormal findings: Secondary | ICD-10-CM

## 2024-03-12 NOTE — Patient Instructions (Addendum)
 Ms. Schorsch,  Thank you for taking the time for your Medicare Wellness Visit. I appreciate your continued commitment to your health goals. Please review the care plan we discussed, and feel free to reach out if I can assist you further.  Please note that Annual Wellness Visits do not include a physical exam. Some assessments may be limited, especially if the visit was conducted virtually. If needed, we may recommend an in-person follow-up with your provider.  Ongoing Care Seeing your primary care provider every 3 to 6 months helps us  monitor your health and provide consistent, personalized care.   Referrals If a referral was made during today's visit and you haven't received any updates within two weeks, please contact the referred provider directly to check on the status.  Advance directive information may be found on [email or web address] under Advance Care directives or Living will.   Recommended Screenings:  Health Maintenance  Topic Date Due   Zoster (Shingles) Vaccine (1 of 2) Never done   Medicare Annual Wellness Visit  03/11/2024   Flu Shot  06/29/2024*   COVID-19 Vaccine (4 - 2025-26 season) 12/04/2025*   Colon Cancer Screening  08/28/2024   Breast Cancer Screening  02/25/2025   DTaP/Tdap/Td vaccine (3 - Td or Tdap) 06/18/2027   Pneumococcal Vaccine for age over 47  Completed   Osteoporosis screening with Bone Density Scan  Completed   Hepatitis C Screening  Completed   Meningitis B Vaccine  Aged Out   Cologuard (Stool DNA test)  Discontinued  *Topic was postponed. The date shown is not the original due date.       03/12/2024    2:25 PM  Advanced Directives  Does Patient Have a Medical Advance Directive? No  Would patient like information on creating a medical advance directive? Yes (MAU/Ambulatory/Procedural Areas - Information given)    Vision: Annual vision screenings are recommended for early detection of glaucoma, cataracts, and diabetic retinopathy. These exams can also reveal  signs of chronic conditions such as diabetes and high blood pressure.  Dental: Annual dental screenings help detect early signs of oral cancer, gum disease, and other conditions linked to overall health, including heart disease and diabetes.

## 2024-03-12 NOTE — Progress Notes (Signed)
 Chief Complaint  Patient presents with   Medicare Wellness     Subjective:   Lindsey [name] is a 68 y.o. female who presents for a Medicare Annual Wellness Visit.  Visit info / Clinical Intake: Medicare Wellness Visit Type:: Subsequent Annual Wellness Visit Persons participating in visit and providing information:: patient Medicare Wellness Visit Mode:: Telephone If telephone:: video declined Since this visit was completed virtually, some vitals may be partially provided or unavailable. Missing vitals are due to the limitations of the virtual format.: Unable to obtain vitals - no equipment If Telephone or Video please confirm:: I connected with patient using audio/video enable telemedicine. I verified patient identity with two identifiers, discussed telehealth limitations, and patient agreed to proceed. Patient Location:: home Provider Location:: home clinic Interpreter Needed?: No Pre-visit prep was completed: yes AWV questionnaire completed by patient prior to visit?: no Living arrangements:: lives with spouse/significant other Patient's Overall Health Status Rating: (!) fair Typical amount of pain: (!) a lot Does pain affect daily life?: (!) yes Are you currently prescribed opioids?: (!) yes  Dietary Habits and Nutritional Risks How many meals a day?: (!) 1 Eats fruit and vegetables daily?: yes (sometimes but not everyday) Most meals are obtained by: eating out; having others provide food In the last 2 weeks, have you had any of the following?: (!) nausea, vomiting, diarrhea Diabetic:: no  Functional Status Activities of Daily Living (to include ambulation/medication): (!) Needs Assist Feeding: Independent Dressing/Grooming: Independent Bathing: Independent Toileting: Independent Transfer: Independent Ambulation: Independent Medication Administration: Independent Home Management (perform basic housework or laundry): Needs assistance (comment) (husband helps  w/home) Manage your own finances?: (!) no (husband manages) Primary transportation is: family / friends (has license but does not drive in [id number]) Concerns about vision?: (!) yes Concerns about hearing?: no  Fall Screening Falls in the past year?: 0 Number of falls in past year: 0 Was there an injury with Fall?: 0 Fall Risk Category Calculator: 0 Patient Fall Risk Level: Low Fall Risk  Fall Risk Patient at Risk for Falls Due to: Orthopedic patient; Medication side effect Fall risk Follow up: Education provided; Falls evaluation completed; Falls prevention discussed  Home and Transportation Safety: All rugs have non-skid backing?: N/A, no rugs All stairs or steps have railings?: yes Grab bars in the bathtub or shower?: yes Have non-skid surface in bathtub or shower?: (!) no Good home lighting?: yes Regular seat belt use?: yes Hospital stays in the last year:: no  Cognitive Assessment Difficulty concentrating, remembering, or making decisions? : yes Will 6CIT or Mini Cog be Completed: yes What year is it?: 0 points What month is it?: 0 points Give patient an address phrase to remember (5 components): [address] [place]  About what time is it?: 0 points Count backwards from 20 to 1: 0 points Say the months of the year in reverse: 0 points Repeat the address phrase from earlier: 0 points 6 CIT Score: 0 points  Advance Directives (For Healthcare) Does Patient Have a Medical Advance Directive?: No Would patient like information on creating a medical advance directive?: Yes (MAU/Ambulatory/Procedural Areas - Information given)  Reviewed/Updated  Reviewed/Updated: Reviewed All (Medical, Surgical, Family, Medications, Allergies, Care Teams, Patient Goals)    Allergies (verified) Buspirone hcl, Crestor  [rosuvastatin ], Simvastatin, Sulfa antibiotics, and Sulfasalazine   Current Medications (verified) Outpatient Encounter Medications as of 03/12/2024  Medication  Sig   atenolol  (TENORMIN ) 100 MG tablet TAKE 1 TABLET BY MOUTH EVERY DAY   baclofen (LIORESAL) 20 MG  tablet Take 20 mg by mouth daily as needed for muscle spasms (as needed).   botulinum toxin Type A (BOTOX) 100 units SOLR injection Inject 200 Units into the muscle every 3 (three) months.   calcium  carbonate (TUMS - DOSED IN MG ELEMENTAL CALCIUM ) 500 MG chewable tablet Chew 1-2 tablets by mouth 2 (two) times daily as needed for indigestion or heartburn.   Cholecalciferol (VITAMIN D -3) 125 MCG (5000 UT) TABS Take 5,000 Units by mouth every evening.   diclofenac  sodium (VOLTAREN ) 1 % GEL Apply 2 to 4 gram to affected joints up to 4 times daily   docusate sodium (COLACE) 100 MG capsule Take 200 mg by mouth at bedtime.   DULoxetine  (CYMBALTA ) 60 MG capsule TAKE 1 CAPSULE BY MOUTH EVERY DAY   ezetimibe  (ZETIA ) 10 MG tablet TAKE 1 TABLET BY MOUTH EVERY DAY   famotidine  (PEPCID ) 20 MG tablet Take 20 mg by mouth daily as needed for heartburn or indigestion.   fentaNYL  (DURAGESIC ) 12 MCG/HR 1 patch every other day.   hydrOXYzine (ATARAX/VISTARIL) 50 MG tablet Take 50 mg by mouth at bedtime.   ibuprofen (ADVIL) 200 MG tablet Take 400 mg by mouth every 8 (eight) hours as needed (pain.).   lidocaine  (XYLOCAINE ) 5 % ointment Apply 1 Application topically daily as needed (pain.).   LUTEIN PO Take 40 mg by mouth at bedtime.   magnesium oxide (MAG-OX) 400 (240 Mg) MG tablet Take 400 mg by mouth at bedtime.   methenamine (HIPREX) 1 g tablet Take 1 g by mouth.   naloxone (NARCAN) nasal spray 4 mg/0.1 mL Place into the nose.   polyethylene glycol powder (GLYCOLAX/MIRALAX) 17 GM/SCOOP powder Take 17 g by mouth daily as needed (constipation.).   No facility-administered encounter medications on file as of 03/12/2024.    History: Past Medical History:  Diagnosis Date   Anxiety    Arthritis    At high risk for breast cancer 07/15/2023   Cataract    bil - not ready for surgery   Chronic fatigue     Depression    Early onset macular degeneration    Bilateral   Fibromyalgia    GERD (gastroesophageal reflux disease)    Heart murmur    Hemorrhoids    Hyperglycemia 12/24/2013   Hyperlipidemia    Hypersomnia    Hypertension    IBS (irritable bowel syndrome)    Interstitial cystitis    Migraines    OSA (obstructive sleep apnea)    Osteopenia    Pelvic floor dysfunction    PFO (patent foramen ovale)    Pneumonia    Pre-diabetes    Sleep apnea    Tendonitis    hand   Tubular adenoma    Vitamin D  deficiency    Past Surgical History:  Procedure Laterality Date   advanced colonoscopy  07/2023   ANKLE ARTHROSCOPY Right    ANKLE FRACTURE SURGERY Left    BLADDER SURGERY     and hydrodistension   BREAST EXCISIONAL BIOPSY Bilateral    Multiple Benign Lumps removed     BREAST LUMPECTOMY     many, bilateral   CERVICAL DISCECTOMY  2006   fusion and plating   CHOLECYSTECTOMY N/A 09/26/2015   Procedure: LAPAROSCOPIC CHOLECYSTECTOMY WITH INTRAOPERATIVE CHOLANGIOGRAM;  Surgeon: Donnice Lunger, MD;  Location: [facility];  Service: General;  Laterality: N/A;   COLONOSCOPY     COLONOSCOPY  03/2023   DRUG INDUCED ENDOSCOPY Bilateral 10/17/2021   Procedure: DRUG INDUCED ENDOSCOPY;  Surgeon: Carlie,  Vaughan, MD;  Location: [facility];  Service: ENT;  Laterality: Bilateral;   EYE SURGERY     FRACTURE SURGERY     ankle fracture x 2   IMPLANTATION OF HYPOGLOSSAL NERVE STIMULATOR Right 01/22/2022   Procedure: IMPLANTATION OF HYPOGLOSSAL NERVE STIMULATOR;  Surgeon: Carlie Vaughan, MD;  Location: [facility];  Service: ENT;  Laterality: Right;   NASAL SINUS SURGERY     multiple, x4   SPINE SURGERY     UPPER GASTROINTESTINAL ENDOSCOPY     VAGINAL HYSTERECTOMY  1990   because of the IC partial   Family History  Problem Relation Age of Onset   Fibromyalgia Mother    Thyroid  disease Mother    Heart attack Father    Diabetes Father    Alcohol abuse Father    Heart disease Father     Depression Father    Hypertension Father    Obesity Father    Brain cancer Maternal Aunt        dx 42s; d. 12s   Brain cancer Maternal Uncle        dx 1s; d. 33s   Cancer Paternal Aunt        unknown type; d. late 60s-early 70s; mets   Melanoma Maternal Grandmother        dx >50   Diabetes Paternal Grandmother    Stroke Paternal Grandmother    Stomach cancer Paternal Grandfather        dx 83s; d. 56s   Diabetes Paternal Grandfather    Cancer Paternal Grandfather    Breast cancer Neg Hx    Rectal cancer Neg Hx    Colon polyps Neg Hx    Esophageal cancer Neg Hx    Social History   Occupational History   Occupation: Disabled from [employer]    Employer: DISABLED  Tobacco Use   Smoking status: Never    Passive exposure: Past   Smokeless tobacco: Never  Vaping Use   Vaping status: Never Used  Substance and Sexual Activity   Alcohol use: No    Alcohol/week: 0.0 standard drinks of alcohol   Drug use: No   Sexual activity: Yes   Tobacco Counseling Counseling given: Not Answered  SDOH Screenings   Food Insecurity: No Food Insecurity (03/12/2024)  Housing: Low Risk (03/12/2024)  Transportation Needs: No Transportation Needs (03/12/2024)  Utilities: Not At Risk (03/12/2024)  Alcohol Screen: Low Risk (03/12/2023)  Depression (PHQ2-9): Medium Risk (03/12/2024)  Financial Resource Strain: Low Risk (03/08/2024)  Physical Activity: Inactive (03/12/2024)  Social Connections: Socially Isolated (03/12/2024)  Stress: Stress Concern Present (03/12/2024)  Tobacco Use: Low Risk (03/12/2024)  Health Literacy: Adequate Health Literacy (03/12/2024)   See flowsheets for full screening details  Depression Screen PHQ 2 & 9 Depression Scale- Over the past 2 weeks, how often have you been bothered by any of the following problems? Little interest or pleasure in doing things: 1 Feeling down, depressed, or hopeless (PHQ Adolescent also includes...irritable): 1 PHQ-2 Total Score: 2 Trouble  falling or staying asleep, or sleeping too much: 1 Feeling tired or having little energy: 1 Poor appetite or overeating (PHQ Adolescent also includes...weight loss): 1 Feeling bad about yourself - or that you are a failure or have let yourself or your family down: 2 Trouble concentrating on things, such as reading the newspaper or watching television (PHQ Adolescent also includes...like school work): 1 Moving or speaking so slowly that other people could have noticed. Or the opposite - being so fidgety  or restless that you have been moving around a lot more than usual: 0 Thoughts that you would be better off dead, or of hurting yourself in some way: 0 PHQ-9 Total Score: 8 If you checked off any problems, how difficult have these problems made it for you to do your work, take care of things at home, or get along with other people?: Somewhat difficult  Depression Treatment Depression Interventions/Treatment : Currently on Treatment     Goals Addressed             This Visit's Progress    I would like to decrease the pain medication and feel better       COMPLETED: Patient Stated       Starting 08/07/2018, I will continue to take medications as prescribed.      COMPLETED: Patient Stated       08/25/2019, I will maintain and continue medications as prescribed.      Patient Stated   Not on track    Would like to maintain current routine     COMPLETED: Patient Stated       No goals             Objective:    Today's Vitals   03/12/24 1421  Weight: 186 lb (84.4 kg)  Height: 5' 4 (1.626 m)   Body mass index is 31.93 kg/m.  Hearing/Vision screen Vision Screening - Comments:: UTD w/visits to Dr Mittie Immunizations and Health Maintenance Health Maintenance  Topic Date Due   Zoster Vaccines- Shingrix (1 of 2) Never done   Medicare Annual Wellness (AWV)  03/11/2024   Influenza Vaccine  06/29/2024 (Originally 10/31/2023)   COVID-19 Vaccine (4 - 2025-26 season) 12/04/2025  (Originally 12/01/2023)   Colonoscopy  08/28/2024   Mammogram  02/25/2025   DTaP/Tdap/Td (3 - Td or Tdap) 06/18/2027   Pneumococcal Vaccine: 50+ Years  Completed   Bone Density Scan  Completed   Hepatitis C Screening  Completed   Meningococcal B Vaccine  Aged Out   Fecal DNA (Cologuard)  Discontinued        Assessment/Plan:  This is a routine wellness examination for Sincere.  Patient Care Team: Tower, Laine LABOR, MD as PCP - General Darliss Rogue, MD as PCP - Cardiology (Cardiology) Fate Morna SAILOR, [facility] (Inactive) as Pharmacist (Pharmacist) Mittie Gaskin, MD as Referring Physician (Ophthalmology) Dolphus Reiter, MD as Consulting Physician (Rheumatology)  I have personally reviewed and noted the following in the patients chart:   Medical and social history Use of alcohol, tobacco or illicit drugs  Current medications and supplements including opioid prescriptions. Functional ability and status Nutritional status Physical activity Advanced directives List of other physicians Hospitalizations, surgeries, and ER visits in previous 12 months Vitals Screenings to include cognitive, depression, and falls Referrals and appointments  No orders of the defined types were placed in this encounter.  In addition, I have reviewed and discussed with patient certain preventive protocols, quality metrics, and best practice recommendations. A written personalized care plan for preventive services as well as general preventive health recommendations were provided to patient.   Erminio LITTIE Saris, LPN   [id number]    After Visit Summary: (MyChart) Due to this being a telephonic visit, the after visit summary with patients personalized plan was offered to patient via MyChart   Nurse Notes: No voiced or noted concerns at this time Patient advised to keep follow-up appointment with PCP (Jan 2026) Appointment(s) made: (AWV/CPE Jan 2027) HM Addressed: Zoster vaccine not desired

## 2024-03-19 ENCOUNTER — Other Ambulatory Visit: Payer: Self-pay | Admitting: Physician Assistant

## 2024-03-19 NOTE — Telephone Encounter (Signed)
 Last Fill: 12/23/2023  Next Visit: 01/06/2025  Last Visit: 01/08/2024  Dx: Fibromyalgia History of depression   Current Dose per office note on 01/08/2024: Cymbalta  60 mg 1 capsule by mouth daily.   Okay to refill Cymbalta ?

## 2024-03-21 ENCOUNTER — Other Ambulatory Visit: Payer: Self-pay | Admitting: Medical Genetics

## 2024-03-22 ENCOUNTER — Ambulatory Visit (INDEPENDENT_AMBULATORY_CARE_PROVIDER_SITE_OTHER): Admitting: Family Medicine

## 2024-03-22 ENCOUNTER — Encounter: Payer: Self-pay | Admitting: Family Medicine

## 2024-03-22 VITALS — BP 122/70 | HR 62 | Temp 97.9°F | Ht 64.0 in | Wt 187.1 lb

## 2024-03-22 DIAGNOSIS — R5382 Chronic fatigue, unspecified: Secondary | ICD-10-CM | POA: Diagnosis not present

## 2024-03-22 DIAGNOSIS — E559 Vitamin D deficiency, unspecified: Secondary | ICD-10-CM

## 2024-03-22 DIAGNOSIS — R7303 Prediabetes: Secondary | ICD-10-CM | POA: Diagnosis not present

## 2024-03-22 DIAGNOSIS — E2839 Other primary ovarian failure: Secondary | ICD-10-CM

## 2024-03-22 DIAGNOSIS — E038 Other specified hypothyroidism: Secondary | ICD-10-CM | POA: Diagnosis not present

## 2024-03-22 DIAGNOSIS — M797 Fibromyalgia: Secondary | ICD-10-CM | POA: Diagnosis not present

## 2024-03-22 DIAGNOSIS — M8589 Other specified disorders of bone density and structure, multiple sites: Secondary | ICD-10-CM

## 2024-03-22 DIAGNOSIS — E875 Hyperkalemia: Secondary | ICD-10-CM

## 2024-03-22 DIAGNOSIS — Z1231 Encounter for screening mammogram for malignant neoplasm of breast: Secondary | ICD-10-CM | POA: Diagnosis not present

## 2024-03-22 DIAGNOSIS — E538 Deficiency of other specified B group vitamins: Secondary | ICD-10-CM | POA: Diagnosis not present

## 2024-03-22 MED ORDER — CYANOCOBALAMIN 1000 MCG/ML IJ SOLN
1000.0000 ug | Freq: Once | INTRAMUSCULAR | Status: AC
  Administered 2024-03-22: 1000 ug via INTRAMUSCULAR

## 2024-03-22 NOTE — Assessment & Plan Note (Signed)
 Lab Results  Component Value Date   VITAMINB12 168 (L) 03/10/2024  Will give first B12 shot today Then weekly for 3 weeks for total of 4 Now taking B12 1000 mcg daily   Plan to check level in 2 months

## 2024-03-22 NOTE — Assessment & Plan Note (Signed)
 Lab Results  Component Value Date   TSH 3.19 03/10/2024   Normal FT4

## 2024-03-22 NOTE — Patient Instructions (Addendum)
 Look at your labels and make sure none of your vitamins contain potassium  If so , stop that and let us  know  We will watch the potassium level  Lab today    For B12 deficiency  B12 shot today  Another 3 shots a week apart  Keep taking 1000 mcg B12 oral supplement daily    You have an order for:  [x]   3D Mammogram  [x]   Bone Density     Please call for appointment:   [x]   [facility]  [address] [place] [address]  [phone]  []   [facility])   [address]  [place], [place] [address]  [phone]  []   [facility]      [address] [place], [place]        [phone]         []   [facility]  [address] [place], [place]  [phone]  []  [facility] [facility]   [address]   [place], [place] [address]  [phone]  []  [facility]  [address] [place], [place] [address] [phone]    Make sure to wear two piece clothing  No lotions powders or deodorants the day of the appointment Make sure to bring picture ID and insurance card.  Bring list of medications you are currently taking including any supplements.   Schedule your screening mammogram through MyChart!   Select [place] imaging sites can now be scheduled through MyChart.  Log into your MyChart account.  Go to Visit (or Appointments if  on mobile App) --> Schedule an  Appointment  Under Select a Reason for Visit choose the Mammogram  Screening option.  Complete the pre-visit questions  and select the time and place that  best fits your schedule

## 2024-03-22 NOTE — Assessment & Plan Note (Signed)
 Due for dexa -ordered  No falls or fractures   Increasing D3 to 10, 000 international units daily  Lab in 2 mo

## 2024-03-22 NOTE — Assessment & Plan Note (Signed)
 Suspect B12 def adding to this See a/p for treament plan

## 2024-03-22 NOTE — Assessment & Plan Note (Signed)
 Some improvement today since last ketamine treatment  Also started oral vitamin B12

## 2024-03-22 NOTE — Progress Notes (Signed)
 Per orders of Dr. Laine Balls, injection of B-12 given by Rayleen Forts M in Left deltoid Patient tolerated injection well. Patient will make appointment for 1 week(s)

## 2024-03-22 NOTE — Assessment & Plan Note (Signed)
 Last vitamin D  Lab Results  Component Value Date   VD25OH 29.19 (L) 03/10/2024   Pt did increase vit D3 to 10,000 international units daily  Will re check in 2 mo

## 2024-03-22 NOTE — Progress Notes (Signed)
 "  Subjective:    Patient ID: Lindsey [name], female    DOB: 1955/10/07, 68 y.o.   MRN: [id number]  HPI  Wt Readings from Last 3 Encounters:  03/22/24 187 lb 2 oz (84.9 kg)  03/12/24 186 lb (84.4 kg)  03/10/24 186 lb 6 oz (84.5 kg)   32.12 kg/m  Vitals:   03/22/24 1431 03/22/24 1449  BP: 134/86 122/70  Pulse: 62   Temp: 97.9 F (36.6 C)   SpO2: 98%     Pt presents for follow up of  Fatigue Vit B12 def  Vit D def  Elevated K level    Seen for fatigue last time In setting of myofascial pain syndrome  Labs ordered   Feeling a little better since last ketamine treatment    Thinks she remembers being B12 def in past  Lab Results  Component Value Date   VITAMINB12 168 (L) 03/10/2024  Some tingling  Started taking B12 1000 mcg daily oral  Wants to get a shot     History of subclinical hypothyroidism in past  Lab Results  Component Value Date   TSH 3.19 03/10/2024  FT4 normal  Last vitamin D  Lab Results  Component Value Date   VD25OH 29.19 (L) 03/10/2024  Doubled up on her vit D    Lab Results  Component Value Date   WBC 7.9 03/10/2024   HGB 15.0 03/10/2024   HCT 44.3 03/10/2024   MCV 88.1 03/10/2024   PLT 273.0 03/10/2024   Has had elevated K in past with nsaids  Not taking much   Lab Results  Component Value Date   NA 137 03/10/2024   K 5.3 No hemolysis seen (H) 03/10/2024   CO2 32 03/10/2024   GLUCOSE 108 (H) 03/10/2024   BUN 14 03/10/2024   CREATININE 0.86 03/10/2024   CALCIUM  10.1 03/10/2024   GFR 69.92 03/10/2024   EGFR 71 01/09/2023   GFRNONAA >60 04/15/2022   Lab Results  Component Value Date   ALT 13 03/10/2024   AST 13 03/10/2024   ALKPHOS 70 03/10/2024   BILITOT 0.5 03/10/2024    Lab Results  Component Value Date   HGBA1C 6.1 03/10/2024   HGBA1C 6.2 02/05/2023   HGBA1C 6.2 12/31/2021     Patient Active Problem List   Diagnosis Date Noted   Encounter for screening mammogram for breast cancer 03/22/2024   B12  deficiency 03/11/2024   Fatigue 03/10/2024   Chronic pain 09/04/2023   At high risk for breast cancer 07/15/2023   Genetic testing 07/15/2023   Polyposis coli 03/17/2023   Hyperkalemia 02/12/2023   Colon cancer screening 11/06/2020   Subclinical hypothyroidism 05/29/2020   Prediabetes 08/09/2018   Estrogen deficiency 01/28/2018   Autoimmune disease 06/18/2017   History of recurrent cystitis (IC) 08/16/2016   Pelvic floor dysfunction 08/16/2016   Dysuria 05/24/2016   Macular degeneration 04/09/2016   Osteoarthritis, knee 02/15/2016   S/P laparoscopic cholecystectomy June 2017 09/26/2015   Dermatitis 10/31/2014   Encounter for Medicare annual wellness exam 09/04/2012   HSV (herpes simplex virus) infection 04/08/2012   IBS (irritable bowel syndrome) 10/21/2011   Routine general medical examination at a health care facility 07/02/2011   Osteopenia 05/17/2009   Depressed mood 10/05/2008   GERD 05/05/2008   OSA (obstructive sleep apnea) 06/25/2007   Hyperlipidemia 05/31/2007   Essential hypertension 05/31/2007   Other fatigue 04/23/2007   Vitamin D  deficiency 01/22/2007   Migraine headache 01/22/2007   Interstitial cystitis 01/22/2007  MENOPAUSE-RELATED VASOMOTOR SYMPTOMS 01/22/2007   OSTEOARTHROSIS, GENERALIZED, MULTIPLE SITES 01/22/2007   Fibromyalgia 01/22/2007   Past Medical History:  Diagnosis Date   Anxiety    Arthritis    At high risk for breast cancer 07/15/2023   Cataract    bil - not ready for surgery   Chronic fatigue    Depression    Early onset macular degeneration    Bilateral   Fibromyalgia    GERD (gastroesophageal reflux disease)    Heart murmur    Hemorrhoids    Hyperglycemia 12/24/2013   Hyperlipidemia    Hypersomnia    Hypertension    IBS (irritable bowel syndrome)    Interstitial cystitis    Migraines    OSA (obstructive sleep apnea)    Osteopenia    Pelvic floor dysfunction    PFO (patent foramen ovale)    Pneumonia    Pre-diabetes     Sleep apnea    Tendonitis    hand   Tubular adenoma    Vitamin D  deficiency    Past Surgical History:  Procedure Laterality Date   advanced colonoscopy  07/2023   ANKLE ARTHROSCOPY Right    ANKLE FRACTURE SURGERY Left    BLADDER SURGERY     and hydrodistension   BREAST EXCISIONAL BIOPSY Bilateral    Multiple Benign Lumps removed     BREAST LUMPECTOMY     many, bilateral   CERVICAL DISCECTOMY  2006   fusion and plating   CHOLECYSTECTOMY N/A 09/26/2015   Procedure: LAPAROSCOPIC CHOLECYSTECTOMY WITH INTRAOPERATIVE CHOLANGIOGRAM;  Surgeon: Donnice Lunger, MD;  Location: [facility];  Service: General;  Laterality: N/A;   COLONOSCOPY     COLONOSCOPY  03/2023   DRUG INDUCED ENDOSCOPY Bilateral 10/17/2021   Procedure: DRUG INDUCED ENDOSCOPY;  Surgeon: Carlie Clark, MD;  Location: [facility] OR;  Service: ENT;  Laterality: Bilateral;   EYE SURGERY     FRACTURE SURGERY     ankle fracture x 2   IMPLANTATION OF HYPOGLOSSAL NERVE STIMULATOR Right 01/22/2022   Procedure: IMPLANTATION OF HYPOGLOSSAL NERVE STIMULATOR;  Surgeon: Carlie Clark, MD;  Location: [facility];  Service: ENT;  Laterality: Right;   NASAL SINUS SURGERY     multiple, x4   SPINE SURGERY     UPPER GASTROINTESTINAL ENDOSCOPY     VAGINAL HYSTERECTOMY  1990   because of the IC partial   Social History[1] Family History  Problem Relation Age of Onset   Fibromyalgia Mother    Thyroid  disease Mother    Heart attack Father    Diabetes Father    Alcohol abuse Father    Heart disease Father    Depression Father    Hypertension Father    Obesity Father    Brain cancer Maternal Aunt        dx 46s; d. 64s   Brain cancer Maternal Uncle        dx 52s; d. 38s   Cancer Paternal Aunt        unknown type; d. late 60s-early 70s; mets   Melanoma Maternal Grandmother        dx >50   Diabetes Paternal Grandmother    Stroke Paternal Grandmother    Stomach cancer Paternal Grandfather        dx 44s; d. 91s   Diabetes  Paternal Grandfather    Cancer Paternal Grandfather    Breast cancer Neg Hx    Rectal cancer Neg Hx    Colon polyps Neg Hx  Esophageal cancer Neg Hx    Allergies[2] Medications Ordered Prior to Encounter[3]  Review of Systems  Constitutional:  Positive for fatigue. Negative for activity change, appetite change, fever and unexpected weight change.  HENT:  Negative for congestion, ear pain, rhinorrhea, sinus pressure and sore throat.   Eyes:  Negative for pain, redness and visual disturbance.  Respiratory:  Negative for cough, shortness of breath and wheezing.   Cardiovascular:  Negative for chest pain and palpitations.  Gastrointestinal:  Negative for abdominal pain, blood in stool, constipation and diarrhea.  Endocrine: Negative for polydipsia and polyuria.  Genitourinary:  Negative for dysuria, frequency and urgency.  Musculoskeletal:  Positive for arthralgias and myalgias. Negative for back pain.  Skin:  Negative for pallor and rash.  Allergic/Immunologic: Negative for environmental allergies.  Neurological:  Negative for dizziness, syncope and headaches.  Hematological:  Negative for adenopathy. Does not bruise/bleed easily.  Psychiatric/Behavioral:  Negative for decreased concentration and dysphoric mood. The patient is not nervous/anxious.        Objective:   Physical Exam Constitutional:      General: She is not in acute distress.    Appearance: Normal appearance. She is obese. She is not ill-appearing or diaphoretic.  Cardiovascular:     Rate and Rhythm: Normal rate and regular rhythm.  Pulmonary:     Effort: Pulmonary effort is normal. No respiratory distress.  Skin:    Findings: No erythema or rash.  Neurological:     Mental Status: She is alert. Mental status is at baseline.  Psychiatric:        Mood and Affect: Mood normal.           Assessment & Plan:   Problem List Items Addressed This Visit       Endocrine   Subclinical hypothyroidism   Lab  Results  Component Value Date   TSH 3.19 03/10/2024   Normal FT4          Other   Vitamin D  deficiency   Last vitamin D  Lab Results  Component Value Date   VD25OH 29.19 (L) 03/10/2024   Pt did increase vit D3 to 10,000 international units daily  Will re check in 2 mo       Prediabetes   Lab Results  Component Value Date   HGBA1C 6.1 03/10/2024   HGBA1C 6.2 02/05/2023   HGBA1C 6.2 12/31/2021   disc imp of low glycemic diet and wt loss to prevent DM2       Hyperkalemia   Lab Results  Component Value Date   K 5.3 No hemolysis seen (H) 03/10/2024  In past from oral nsaid   Has not had recently  Pt will check her vitamins for added K   Re check today No clinical changes       Relevant Orders   Basic metabolic panel with GFR   Fibromyalgia   Some improvement today since last ketamine treatment  Also started oral vitamin B12      Fatigue   Suspect B12 def adding to this See a/p for treament plan      Estrogen deficiency   Dexa ordered Pt will call to schedule         Relevant Orders   DG Bone Density   Encounter for screening mammogram for breast cancer   Mammogram ordered  Pt will call to schedule       Relevant Orders   MM 3D SCREENING MAMMOGRAM BILATERAL BREAST   B12 deficiency - Primary  Lab Results  Component Value Date   VITAMINB12 168 (L) 03/10/2024  Will give first B12 shot today Then weekly for 3 weeks for total of 4 Now taking B12 1000 mcg daily   Plan to check level in 2 months          [1]  Social History Tobacco Use   Smoking status: Never    Passive exposure: Past   Smokeless tobacco: Never  Vaping Use   Vaping status: Never Used  Substance Use Topics   Alcohol use: No    Alcohol/week: 0.0 standard drinks of alcohol   Drug use: No  [2]  Allergies Allergen Reactions   Buspirone Hcl Other (See Comments)   Crestor  [Rosuvastatin ]     Muscle pain     Simvastatin     REACTION: malaise   Sulfa Antibiotics  Other (See Comments)    nausea   Sulfasalazine Nausea And Vomiting    nausea  [3]  Current Outpatient Medications on File Prior to Visit  Medication Sig Dispense Refill   atenolol  (TENORMIN ) 100 MG tablet TAKE 1 TABLET BY MOUTH EVERY DAY 90 tablet 2   baclofen (LIORESAL) 20 MG tablet Take 20 mg by mouth daily as needed for muscle spasms (as needed).     botulinum toxin Type A (BOTOX) 100 units SOLR injection Inject 200 Units into the muscle every 3 (three) months.     calcium  carbonate (TUMS - DOSED IN MG ELEMENTAL CALCIUM ) 500 MG chewable tablet Chew 1-2 tablets by mouth 2 (two) times daily as needed for indigestion or heartburn.     Cholecalciferol (VITAMIN D -3) 125 MCG (5000 UT) TABS Take 5,000 Units by mouth every evening.     diclofenac  sodium (VOLTAREN ) 1 % GEL Apply 2 to 4 gram to affected joints up to 4 times daily 400 g 2   docusate sodium (COLACE) 100 MG capsule Take 200 mg by mouth at bedtime.     DULoxetine  (CYMBALTA ) 60 MG capsule TAKE 1 CAPSULE BY MOUTH EVERY DAY 90 capsule 0   ezetimibe  (ZETIA ) 10 MG tablet TAKE 1 TABLET BY MOUTH EVERY DAY 90 tablet 2   famotidine  (PEPCID ) 20 MG tablet Take 20 mg by mouth daily as needed for heartburn or indigestion.     fentaNYL  (DURAGESIC ) 12 MCG/HR 1 patch every other day.     hydrOXYzine (ATARAX/VISTARIL) 50 MG tablet Take 50 mg by mouth at bedtime.     ibuprofen (ADVIL) 200 MG tablet Take 400 mg by mouth every 8 (eight) hours as needed (pain.).     lidocaine  (XYLOCAINE ) 5 % ointment Apply 1 Application topically daily as needed (pain.).     LUTEIN PO Take 40 mg by mouth at bedtime.     magnesium oxide (MAG-OX) 400 (240 Mg) MG tablet Take 400 mg by mouth at bedtime.     methenamine (HIPREX) 1 g tablet Take 1 g by mouth.     naloxone (NARCAN) nasal spray 4 mg/0.1 mL Place into the nose.     polyethylene glycol powder (GLYCOLAX/MIRALAX) 17 GM/SCOOP powder Take 17 g by mouth daily as needed (constipation.).     No current  facility-administered medications on file prior to visit.   "

## 2024-03-22 NOTE — Assessment & Plan Note (Signed)
Dexa ordered Pt will call to schedule

## 2024-03-22 NOTE — Assessment & Plan Note (Signed)
 Lab Results  Component Value Date   HGBA1C 6.1 03/10/2024   HGBA1C 6.2 02/05/2023   HGBA1C 6.2 12/31/2021   disc imp of low glycemic diet and wt loss to prevent DM2

## 2024-03-22 NOTE — Assessment & Plan Note (Signed)
 Lab Results  Component Value Date   K 5.3 No hemolysis seen (H) 03/10/2024  In past from oral nsaid   Has not had recently  Pt will check her vitamins for added K   Re check today No clinical changes

## 2024-03-22 NOTE — Assessment & Plan Note (Signed)
 Mammogram ordered Pt will call to schedule

## 2024-03-23 LAB — BASIC METABOLIC PANEL WITH GFR
BUN: 11 mg/dL (ref 6–23)
CO2: 31 meq/L (ref 19–32)
Calcium: 9.4 mg/dL (ref 8.4–10.5)
Chloride: 101 meq/L (ref 96–112)
Creatinine, Ser: 0.79 mg/dL (ref 0.40–1.20)
GFR: 77.4 mL/min
Glucose, Bld: 87 mg/dL (ref 70–99)
Potassium: 4.9 meq/L (ref 3.5–5.1)
Sodium: 138 meq/L (ref 135–145)

## 2024-03-24 ENCOUNTER — Ambulatory Visit: Payer: Self-pay | Admitting: Family Medicine

## 2024-03-30 ENCOUNTER — Ambulatory Visit (INDEPENDENT_AMBULATORY_CARE_PROVIDER_SITE_OTHER)

## 2024-03-30 ENCOUNTER — Other Ambulatory Visit
Admission: RE | Admit: 2024-03-30 | Discharge: 2024-03-30 | Disposition: A | Discharge status: Home / Self Care | Payer: Self-pay | Source: Ambulatory Visit | Attending: Medical Genetics | Admitting: Medical Genetics

## 2024-03-30 DIAGNOSIS — E538 Deficiency of other specified B group vitamins: Secondary | ICD-10-CM

## 2024-03-30 MED ORDER — CYANOCOBALAMIN 1000 MCG/ML IJ SOLN
1000.0000 ug | Freq: Once | INTRAMUSCULAR | Status: AC
  Administered 2024-03-30: 1000 ug via INTRAMUSCULAR

## 2024-03-30 NOTE — Progress Notes (Signed)
 Per orders of Dr. Laine Balls, injection of B-12 given by Nellie Hummer in Right deltoid Patient tolerated injection well. Patient will make appointment for 1 week

## 2024-04-06 ENCOUNTER — Ambulatory Visit

## 2024-04-06 DIAGNOSIS — E538 Deficiency of other specified B group vitamins: Secondary | ICD-10-CM | POA: Diagnosis not present

## 2024-04-06 MED ORDER — CYANOCOBALAMIN 1000 MCG/ML IJ SOLN
1000.0000 ug | Freq: Once | INTRAMUSCULAR | Status: AC
  Administered 2024-04-06: 1000 ug via INTRAMUSCULAR

## 2024-04-06 NOTE — Progress Notes (Signed)
 Per orders of Dr. Laine Balls, injection of B-12 given by Nellie Hummer in Left deltoid Patient tolerated injection well. Patient will make appointment for 1 week(s)

## 2024-04-11 LAB — GENECONNECT MOLECULAR SCREEN: Genetic Analysis Overall Interpretation: NEGATIVE

## 2024-04-13 ENCOUNTER — Ambulatory Visit: Admitting: *Deleted

## 2024-04-13 DIAGNOSIS — E538 Deficiency of other specified B group vitamins: Secondary | ICD-10-CM | POA: Diagnosis not present

## 2024-04-13 MED ORDER — CYANOCOBALAMIN 1000 MCG/ML IJ SOLN
1000.0000 ug | Freq: Once | INTRAMUSCULAR | Status: AC
  Administered 2024-04-13: 1000 ug via INTRAMUSCULAR

## 2024-04-13 NOTE — Progress Notes (Signed)
 Per orders of Laine Balls, MD injection of Vitamin B12 given by Manuelita JAYSON Frost in the right deltoid. Patient tolerated injection well.

## 2024-04-16 ENCOUNTER — Encounter: Admitting: Family Medicine

## 2024-05-06 ENCOUNTER — Ambulatory Visit
Admission: RE | Admit: 2024-05-06 | Discharge: 2024-05-06 | Disposition: A | Source: Ambulatory Visit | Attending: Family Medicine | Admitting: Family Medicine

## 2024-05-06 DIAGNOSIS — Z1231 Encounter for screening mammogram for malignant neoplasm of breast: Secondary | ICD-10-CM

## 2024-05-06 DIAGNOSIS — E2839 Other primary ovarian failure: Secondary | ICD-10-CM

## 2024-05-24 ENCOUNTER — Other Ambulatory Visit

## 2024-06-08 ENCOUNTER — Encounter: Admitting: Family Medicine

## 2025-01-06 ENCOUNTER — Ambulatory Visit: Admitting: Rheumatology

## 2025-04-19 ENCOUNTER — Encounter: Admitting: Family Medicine

## 2025-04-19 ENCOUNTER — Ambulatory Visit
